# Patient Record
Sex: Male | Born: 1955
Health system: Southern US, Community
[De-identification: ages and names within clinical notes are randomized; demographics above are authoritative.]

## PROBLEM LIST (undated history)

## (undated) DIAGNOSIS — E559 Vitamin D deficiency, unspecified: Secondary | ICD-10-CM

## (undated) DIAGNOSIS — K56609 Unspecified intestinal obstruction, unspecified as to partial versus complete obstruction: Secondary | ICD-10-CM

## (undated) DIAGNOSIS — E119 Type 2 diabetes mellitus without complications: Secondary | ICD-10-CM

## (undated) DIAGNOSIS — K219 Gastro-esophageal reflux disease without esophagitis: Secondary | ICD-10-CM

## (undated) DIAGNOSIS — Z940 Kidney transplant status: Secondary | ICD-10-CM

## (undated) DIAGNOSIS — N289 Disorder of kidney and ureter, unspecified: Secondary | ICD-10-CM

## (undated) DIAGNOSIS — Z973 Presence of spectacles and contact lenses: Secondary | ICD-10-CM

## (undated) DIAGNOSIS — I499 Cardiac arrhythmia, unspecified: Secondary | ICD-10-CM

## (undated) DIAGNOSIS — I509 Heart failure, unspecified: Secondary | ICD-10-CM

## (undated) DIAGNOSIS — J9601 Acute respiratory failure with hypoxia: Secondary | ICD-10-CM

## (undated) DIAGNOSIS — R7989 Other specified abnormal findings of blood chemistry: Secondary | ICD-10-CM

## (undated) DIAGNOSIS — W3400XA Accidental discharge from unspecified firearms or gun, initial encounter: Secondary | ICD-10-CM

## (undated) DIAGNOSIS — N19 Unspecified kidney failure: Secondary | ICD-10-CM

## (undated) DIAGNOSIS — I10 Essential (primary) hypertension: Secondary | ICD-10-CM

## (undated) DIAGNOSIS — R809 Proteinuria, unspecified: Secondary | ICD-10-CM

## (undated) DIAGNOSIS — T403X1A Poisoning by methadone, accidental (unintentional), initial encounter: Secondary | ICD-10-CM

## (undated) HISTORY — PX: JOINT REPLACEMENT: SHX530

## (undated) HISTORY — DX: Kidney transplant status: Z94.0

## (undated) HISTORY — PX: TOTAL KNEE ARTHROPLASTY: SHX125

## (undated) HISTORY — PX: ARTERIOVENOUS GRAFT PLACEMENT: SUR1029

---
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
## 1986-05-10 DIAGNOSIS — W3400XA Accidental discharge from unspecified firearms or gun, initial encounter: Secondary | ICD-10-CM

## 1986-05-10 HISTORY — DX: Accidental discharge from unspecified firearms or gun, initial encounter: W34.00XA

## 1986-05-10 HISTORY — PX: COLOSTOMY: SHX63

## 1986-05-10 HISTORY — PX: EXPLORATORY LAPAROTOMY W/ BOWEL RESECTION: SHX1544

## 1986-05-10 HISTORY — PX: COLOSTOMY REVERSAL: SHX5782

## 1999-01-09 HISTORY — PX: REFRACTIVE SURGERY: SHX103

## 2001-10-08 ENCOUNTER — Inpatient Hospital Stay (HOSPITAL_COMMUNITY): Admission: EM | Admit: 2001-10-08 | Discharge: 2001-10-11 | Payer: Self-pay | Admitting: Emergency Medicine

## 2001-10-08 ENCOUNTER — Encounter: Payer: Self-pay | Admitting: Emergency Medicine

## 2001-10-10 ENCOUNTER — Encounter: Payer: Self-pay | Admitting: Internal Medicine

## 2002-05-10 HISTORY — PX: EYE SURGERY: SHX253

## 2003-10-07 ENCOUNTER — Emergency Department (HOSPITAL_COMMUNITY): Admission: EM | Admit: 2003-10-07 | Discharge: 2003-10-07 | Payer: Self-pay | Admitting: Emergency Medicine

## 2003-12-11 ENCOUNTER — Inpatient Hospital Stay (HOSPITAL_COMMUNITY): Admission: RE | Admit: 2003-12-11 | Discharge: 2003-12-14 | Payer: Self-pay | Admitting: Orthopedic Surgery

## 2004-03-09 ENCOUNTER — Encounter: Admission: RE | Admit: 2004-03-09 | Discharge: 2004-04-29 | Payer: Self-pay | Admitting: Orthopedic Surgery

## 2005-03-04 ENCOUNTER — Emergency Department (HOSPITAL_COMMUNITY): Admission: EM | Admit: 2005-03-04 | Discharge: 2005-03-04 | Payer: Self-pay | Admitting: *Deleted

## 2005-04-11 ENCOUNTER — Ambulatory Visit (HOSPITAL_COMMUNITY): Admission: RE | Admit: 2005-04-11 | Discharge: 2005-04-11 | Payer: Self-pay | Admitting: *Deleted

## 2005-10-15 ENCOUNTER — Ambulatory Visit (HOSPITAL_COMMUNITY): Admission: RE | Admit: 2005-10-15 | Discharge: 2005-10-15 | Payer: Self-pay | Admitting: Nephrology

## 2006-08-26 ENCOUNTER — Encounter: Admission: RE | Admit: 2006-08-26 | Discharge: 2006-08-26 | Payer: Self-pay | Admitting: Nephrology

## 2006-09-27 ENCOUNTER — Emergency Department (HOSPITAL_COMMUNITY): Admission: EM | Admit: 2006-09-27 | Discharge: 2006-09-27 | Payer: Self-pay | Admitting: Emergency Medicine

## 2006-12-07 ENCOUNTER — Emergency Department (HOSPITAL_COMMUNITY): Admission: EM | Admit: 2006-12-07 | Discharge: 2006-12-07 | Payer: Self-pay | Admitting: Emergency Medicine

## 2008-03-29 ENCOUNTER — Emergency Department (HOSPITAL_COMMUNITY): Admission: EM | Admit: 2008-03-29 | Discharge: 2008-03-29 | Payer: Self-pay | Admitting: Emergency Medicine

## 2008-07-17 ENCOUNTER — Encounter: Admission: RE | Admit: 2008-07-17 | Discharge: 2008-07-17 | Payer: Self-pay | Admitting: Nephrology

## 2008-08-06 ENCOUNTER — Ambulatory Visit: Payer: Self-pay | Admitting: Vascular Surgery

## 2008-08-22 ENCOUNTER — Ambulatory Visit (HOSPITAL_COMMUNITY): Admission: RE | Admit: 2008-08-22 | Discharge: 2008-08-22 | Payer: Self-pay | Admitting: Vascular Surgery

## 2008-08-22 ENCOUNTER — Ambulatory Visit: Payer: Self-pay | Admitting: Vascular Surgery

## 2008-10-31 ENCOUNTER — Inpatient Hospital Stay (HOSPITAL_COMMUNITY): Admission: AD | Admit: 2008-10-31 | Discharge: 2008-11-05 | Payer: Self-pay | Admitting: Nephrology

## 2009-04-14 ENCOUNTER — Emergency Department (HOSPITAL_COMMUNITY): Admission: EM | Admit: 2009-04-14 | Discharge: 2009-04-14 | Payer: Self-pay | Admitting: Emergency Medicine

## 2009-04-17 ENCOUNTER — Observation Stay (HOSPITAL_COMMUNITY): Admission: EM | Admit: 2009-04-17 | Discharge: 2009-04-18 | Payer: Self-pay | Admitting: Emergency Medicine

## 2009-05-12 ENCOUNTER — Encounter: Admission: RE | Admit: 2009-05-12 | Discharge: 2009-05-12 | Payer: Self-pay | Admitting: Nephrology

## 2009-06-06 ENCOUNTER — Ambulatory Visit (HOSPITAL_COMMUNITY): Admission: RE | Admit: 2009-06-06 | Discharge: 2009-06-06 | Payer: Self-pay | Admitting: Nephrology

## 2009-07-10 ENCOUNTER — Encounter: Admission: RE | Admit: 2009-07-10 | Discharge: 2009-08-05 | Payer: Self-pay | Admitting: Neurosurgery

## 2009-09-02 ENCOUNTER — Ambulatory Visit (HOSPITAL_COMMUNITY): Admission: RE | Admit: 2009-09-02 | Discharge: 2009-09-02 | Payer: Self-pay | Admitting: Nephrology

## 2009-09-04 ENCOUNTER — Ambulatory Visit (HOSPITAL_COMMUNITY): Admission: RE | Admit: 2009-09-04 | Discharge: 2009-09-04 | Payer: Self-pay | Admitting: Nephrology

## 2009-09-09 ENCOUNTER — Emergency Department (HOSPITAL_COMMUNITY): Admission: EM | Admit: 2009-09-09 | Discharge: 2009-09-09 | Payer: Self-pay | Admitting: Emergency Medicine

## 2009-09-09 ENCOUNTER — Emergency Department (HOSPITAL_COMMUNITY): Admission: EM | Admit: 2009-09-09 | Discharge: 2009-09-09 | Payer: Self-pay | Admitting: Family Medicine

## 2009-09-09 ENCOUNTER — Ambulatory Visit: Payer: Self-pay | Admitting: Vascular Surgery

## 2009-09-18 ENCOUNTER — Encounter: Admission: RE | Admit: 2009-09-18 | Discharge: 2009-09-18 | Payer: Self-pay | Admitting: Neurosurgery

## 2009-12-02 ENCOUNTER — Emergency Department (HOSPITAL_COMMUNITY): Admission: EM | Admit: 2009-12-02 | Discharge: 2009-12-02 | Payer: Self-pay | Admitting: Emergency Medicine

## 2009-12-10 ENCOUNTER — Ambulatory Visit (HOSPITAL_COMMUNITY): Admission: RE | Admit: 2009-12-10 | Discharge: 2009-12-10 | Payer: Self-pay | Admitting: Nephrology

## 2009-12-10 ENCOUNTER — Encounter: Admission: RE | Admit: 2009-12-10 | Discharge: 2010-03-10 | Payer: Self-pay | Admitting: Sports Medicine

## 2009-12-19 ENCOUNTER — Observation Stay (HOSPITAL_COMMUNITY): Admission: EM | Admit: 2009-12-19 | Discharge: 2009-12-20 | Payer: Self-pay | Admitting: Emergency Medicine

## 2010-07-24 LAB — HEPATIC FUNCTION PANEL
ALT: 23 U/L (ref 0–53)
AST: 24 U/L (ref 0–37)
Albumin: 3.2 g/dL — ABNORMAL LOW (ref 3.5–5.2)
Alkaline Phosphatase: 60 U/L (ref 39–117)
Bilirubin, Direct: 0.2 mg/dL (ref 0.0–0.3)
Indirect Bilirubin: 0.3 mg/dL (ref 0.3–0.9)
Total Bilirubin: 0.5 mg/dL (ref 0.3–1.2)
Total Protein: 6.8 g/dL (ref 6.0–8.3)

## 2010-07-24 LAB — COMPREHENSIVE METABOLIC PANEL
ALT: 19 U/L (ref 0–53)
AST: 17 U/L (ref 0–37)
Albumin: 3 g/dL — ABNORMAL LOW (ref 3.5–5.2)
Alkaline Phosphatase: 51 U/L (ref 39–117)
BUN: 44 mg/dL — ABNORMAL HIGH (ref 6–23)
CO2: 28 mEq/L (ref 19–32)
Calcium: 9.4 mg/dL (ref 8.4–10.5)
Chloride: 95 mEq/L — ABNORMAL LOW (ref 96–112)
Creatinine, Ser: 11.12 mg/dL — ABNORMAL HIGH (ref 0.4–1.5)
GFR calc Af Amer: 6 mL/min — ABNORMAL LOW (ref >60)
GFR calc non Af Amer: 5 mL/min — ABNORMAL LOW (ref >60)
Glucose, Bld: 230 mg/dL — ABNORMAL HIGH (ref 70–99)
Potassium: 4.6 mEq/L (ref 3.5–5.1)
Sodium: 137 mEq/L (ref 135–145)
Total Bilirubin: 0.4 mg/dL (ref 0.3–1.2)
Total Protein: 6.4 g/dL (ref 6.0–8.3)

## 2010-07-24 LAB — CBC
HCT: 36 % — ABNORMAL LOW (ref 39.0–52.0)
HCT: 36.9 % — ABNORMAL LOW (ref 39.0–52.0)
Hemoglobin: 11.3 g/dL — ABNORMAL LOW (ref 13.0–17.0)
Hemoglobin: 12.2 g/dL — ABNORMAL LOW (ref 13.0–17.0)
MCH: 28.5 pg (ref 26.0–34.0)
MCH: 29.5 pg (ref 26.0–34.0)
MCHC: 31.4 g/dL (ref 30.0–36.0)
MCHC: 33.1 g/dL (ref 30.0–36.0)
MCV: 89.3 fL (ref 78.0–100.0)
MCV: 90.9 fL (ref 78.0–100.0)
Platelets: 195 10*3/uL (ref 150–400)
Platelets: 200 10*3/uL (ref 150–400)
RBC: 3.96 MIL/uL — ABNORMAL LOW (ref 4.22–5.81)
RBC: 4.13 MIL/uL — ABNORMAL LOW (ref 4.22–5.81)
RDW: 13.5 % (ref 11.5–15.5)
RDW: 13.6 % (ref 11.5–15.5)
WBC: 10.7 10*3/uL — ABNORMAL HIGH (ref 4.0–10.5)
WBC: 9 10*3/uL (ref 4.0–10.5)

## 2010-07-24 LAB — BASIC METABOLIC PANEL
BUN: 36 mg/dL — ABNORMAL HIGH (ref 6–23)
CO2: 29 mEq/L (ref 19–32)
Calcium: 9.4 mg/dL (ref 8.4–10.5)
Chloride: 98 mEq/L (ref 96–112)
Creatinine, Ser: 9.87 mg/dL — ABNORMAL HIGH (ref 0.4–1.5)
GFR calc Af Amer: 7 mL/min — ABNORMAL LOW (ref >60)
GFR calc non Af Amer: 6 mL/min — ABNORMAL LOW (ref >60)
Glucose, Bld: 300 mg/dL — ABNORMAL HIGH (ref 70–99)
Potassium: 4.7 mEq/L (ref 3.5–5.1)
Sodium: 136 mEq/L (ref 135–145)

## 2010-07-24 LAB — CARDIAC PANEL(CRET KIN+CKTOT+MB+TROPI)
CK, MB: 2.5 ng/mL (ref 0.3–4.0)
CK, MB: 2.6 ng/mL (ref 0.3–4.0)
Relative Index: 2.3 (ref 0.0–2.5)
Relative Index: INVALID (ref 0.0–2.5)
Total CK: 109 U/L (ref 7–232)
Total CK: 90 U/L (ref 7–232)
Troponin I: 0.03 ng/mL (ref 0.00–0.06)
Troponin I: 0.04 ng/mL (ref 0.00–0.06)

## 2010-07-24 LAB — DIFFERENTIAL
Basophils Absolute: 0 10*3/uL (ref 0.0–0.1)
Basophils Relative: 0 % (ref 0–1)
Eosinophils Absolute: 0.2 10*3/uL (ref 0.0–0.7)
Eosinophils Relative: 2 % (ref 0–5)
Lymphocytes Relative: 16 % (ref 12–46)
Lymphs Abs: 1.7 10*3/uL (ref 0.7–4.0)
Monocytes Absolute: 0.7 10*3/uL (ref 0.1–1.0)
Monocytes Relative: 7 % (ref 3–12)
Neutro Abs: 8 10*3/uL — ABNORMAL HIGH (ref 1.7–7.7)
Neutrophils Relative %: 75 % (ref 43–77)

## 2010-07-24 LAB — CULTURE, BLOOD (ROUTINE X 2)
Culture: NO GROWTH
Culture: NO GROWTH

## 2010-07-24 LAB — CK TOTAL AND CKMB (NOT AT ARMC)
CK, MB: 2.2 ng/mL (ref 0.3–4.0)
CK, MB: 2.3 ng/mL (ref 0.3–4.0)
Relative Index: INVALID (ref 0.0–2.5)
Relative Index: INVALID (ref 0.0–2.5)
Total CK: 97 U/L (ref 7–232)
Total CK: 99 U/L (ref 7–232)

## 2010-07-24 LAB — GLUCOSE, CAPILLARY
Glucose-Capillary: 161 mg/dL — ABNORMAL HIGH (ref 70–99)
Glucose-Capillary: 264 mg/dL — ABNORMAL HIGH (ref 70–99)

## 2010-07-24 LAB — MRSA PCR SCREENING: MRSA by PCR: NEGATIVE

## 2010-07-24 LAB — TROPONIN I
Troponin I: 0.03 ng/mL (ref 0.00–0.06)
Troponin I: 0.03 ng/mL (ref 0.00–0.06)

## 2010-08-11 LAB — POCT CARDIAC MARKERS
CKMB, poc: 1.1 ng/mL (ref 1.0–8.0)
CKMB, poc: 1.9 ng/mL (ref 1.0–8.0)
Myoglobin, poc: >500 ng/mL (ref 12–200)
Myoglobin, poc: >500 ng/mL (ref 12–200)
Troponin i, poc: <0.05 ng/mL (ref 0.00–0.09)
Troponin i, poc: <0.05 ng/mL (ref 0.00–0.09)

## 2010-08-11 LAB — RENAL FUNCTION PANEL
Albumin: 3 g/dL — ABNORMAL LOW (ref 3.5–5.2)
Albumin: 3.1 g/dL — ABNORMAL LOW (ref 3.5–5.2)
BUN: 36 mg/dL — ABNORMAL HIGH (ref 6–23)
BUN: 41 mg/dL — ABNORMAL HIGH (ref 6–23)
CO2: 25 mEq/L (ref 19–32)
CO2: 30 mEq/L (ref 19–32)
Calcium: 9 mg/dL (ref 8.4–10.5)
Calcium: 9.4 mg/dL (ref 8.4–10.5)
Chloride: 94 mEq/L — ABNORMAL LOW (ref 96–112)
Chloride: 98 mEq/L (ref 96–112)
Creatinine, Ser: 11.2 mg/dL — ABNORMAL HIGH (ref 0.4–1.5)
Creatinine, Ser: 11.88 mg/dL — ABNORMAL HIGH (ref 0.4–1.5)
GFR calc Af Amer: 5 mL/min — ABNORMAL LOW (ref >60)
GFR calc Af Amer: 6 mL/min — ABNORMAL LOW (ref >60)
GFR calc non Af Amer: 4 mL/min — ABNORMAL LOW (ref >60)
GFR calc non Af Amer: 5 mL/min — ABNORMAL LOW (ref >60)
Glucose, Bld: 140 mg/dL — ABNORMAL HIGH (ref 70–99)
Glucose, Bld: 191 mg/dL — ABNORMAL HIGH (ref 70–99)
Phosphorus: 3.4 mg/dL (ref 2.3–4.6)
Phosphorus: 4.6 mg/dL (ref 2.3–4.6)
Potassium: 4.8 mEq/L (ref 3.5–5.1)
Potassium: 4.9 mEq/L (ref 3.5–5.1)
Sodium: 131 mEq/L — ABNORMAL LOW (ref 135–145)
Sodium: 138 mEq/L (ref 135–145)

## 2010-08-11 LAB — CBC
HCT: 33.6 % — ABNORMAL LOW (ref 39.0–52.0)
HCT: 34.7 % — ABNORMAL LOW (ref 39.0–52.0)
HCT: 38.2 % — ABNORMAL LOW (ref 39.0–52.0)
Hemoglobin: 11.3 g/dL — ABNORMAL LOW (ref 13.0–17.0)
Hemoglobin: 11.5 g/dL — ABNORMAL LOW (ref 13.0–17.0)
Hemoglobin: 12.5 g/dL — ABNORMAL LOW (ref 13.0–17.0)
MCHC: 32.8 g/dL (ref 30.0–36.0)
MCHC: 33.3 g/dL (ref 30.0–36.0)
MCHC: 33.6 g/dL (ref 30.0–36.0)
MCV: 90 fL (ref 78.0–100.0)
MCV: 90.1 fL (ref 78.0–100.0)
MCV: 90.2 fL (ref 78.0–100.0)
Platelets: 224 10*3/uL (ref 150–400)
Platelets: 234 10*3/uL (ref 150–400)
Platelets: 269 10*3/uL (ref 150–400)
RBC: 3.72 MIL/uL — ABNORMAL LOW (ref 4.22–5.81)
RBC: 3.85 MIL/uL — ABNORMAL LOW (ref 4.22–5.81)
RBC: 4.24 MIL/uL (ref 4.22–5.81)
RDW: 16.1 % — ABNORMAL HIGH (ref 11.5–15.5)
RDW: 16.2 % — ABNORMAL HIGH (ref 11.5–15.5)
RDW: 16.4 % — ABNORMAL HIGH (ref 11.5–15.5)
WBC: 10.4 10*3/uL (ref 4.0–10.5)
WBC: 7.5 10*3/uL (ref 4.0–10.5)
WBC: 9.1 10*3/uL (ref 4.0–10.5)

## 2010-08-11 LAB — GLUCOSE, CAPILLARY
Glucose-Capillary: 128 mg/dL — ABNORMAL HIGH (ref 70–99)
Glucose-Capillary: 131 mg/dL — ABNORMAL HIGH (ref 70–99)
Glucose-Capillary: 64 mg/dL — ABNORMAL LOW (ref 70–99)
Glucose-Capillary: 69 mg/dL — ABNORMAL LOW (ref 70–99)

## 2010-08-11 LAB — DIFFERENTIAL
Basophils Absolute: 0 10*3/uL (ref 0.0–0.1)
Basophils Absolute: 0 10*3/uL (ref 0.0–0.1)
Basophils Absolute: 0.1 10*3/uL (ref 0.0–0.1)
Basophils Relative: 0 % (ref 0–1)
Basophils Relative: 0 % (ref 0–1)
Basophils Relative: 1 % (ref 0–1)
Eosinophils Absolute: 0.3 10*3/uL (ref 0.0–0.7)
Eosinophils Absolute: 0.4 10*3/uL (ref 0.0–0.7)
Eosinophils Absolute: 0.5 10*3/uL (ref 0.0–0.7)
Eosinophils Relative: 4 % (ref 0–5)
Eosinophils Relative: 5 % (ref 0–5)
Eosinophils Relative: 5 % (ref 0–5)
Lymphocytes Relative: 15 % (ref 12–46)
Lymphocytes Relative: 22 % (ref 12–46)
Lymphocytes Relative: 30 % (ref 12–46)
Lymphs Abs: 1.5 10*3/uL (ref 0.7–4.0)
Lymphs Abs: 2 10*3/uL (ref 0.7–4.0)
Lymphs Abs: 2.3 10*3/uL (ref 0.7–4.0)
Monocytes Absolute: 0.7 10*3/uL (ref 0.1–1.0)
Monocytes Absolute: 0.8 10*3/uL (ref 0.1–1.0)
Monocytes Absolute: 0.9 10*3/uL (ref 0.1–1.0)
Monocytes Relative: 12 % (ref 3–12)
Monocytes Relative: 7 % (ref 3–12)
Monocytes Relative: 9 % (ref 3–12)
Neutro Abs: 3.9 10*3/uL (ref 1.7–7.7)
Neutro Abs: 5.9 10*3/uL (ref 1.7–7.7)
Neutro Abs: 7.7 10*3/uL (ref 1.7–7.7)
Neutrophils Relative %: 52 % (ref 43–77)
Neutrophils Relative %: 65 % (ref 43–77)
Neutrophils Relative %: 74 % (ref 43–77)

## 2010-08-11 LAB — PROTIME-INR
INR: 1 (ref 0.00–1.49)
Prothrombin Time: 13.1 seconds (ref 11.6–15.2)

## 2010-08-11 LAB — BASIC METABOLIC PANEL
BUN: 32 mg/dL — ABNORMAL HIGH (ref 6–23)
CO2: 33 mEq/L — ABNORMAL HIGH (ref 19–32)
Calcium: 10.3 mg/dL (ref 8.4–10.5)
Chloride: 91 mEq/L — ABNORMAL LOW (ref 96–112)
Creatinine, Ser: 10.47 mg/dL — ABNORMAL HIGH (ref 0.4–1.5)
GFR calc Af Amer: 6 mL/min — ABNORMAL LOW (ref >60)
GFR calc non Af Amer: 5 mL/min — ABNORMAL LOW (ref >60)
Glucose, Bld: 236 mg/dL — ABNORMAL HIGH (ref 70–99)
Potassium: 4.3 mEq/L (ref 3.5–5.1)
Sodium: 138 mEq/L (ref 135–145)

## 2010-08-11 LAB — CARDIAC PANEL(CRET KIN+CKTOT+MB+TROPI)
CK, MB: 1 ng/mL (ref 0.3–4.0)
CK, MB: 1.5 ng/mL (ref 0.3–4.0)
Relative Index: INVALID (ref 0.0–2.5)
Relative Index: INVALID (ref 0.0–2.5)
Total CK: 75 U/L (ref 7–232)
Total CK: 81 U/L (ref 7–232)
Troponin I: 0.02 ng/mL (ref 0.00–0.06)
Troponin I: 0.03 ng/mL (ref 0.00–0.06)

## 2010-08-11 LAB — CK TOTAL AND CKMB (NOT AT ARMC)
CK, MB: 1.4 ng/mL (ref 0.3–4.0)
Relative Index: 1.3 (ref 0.0–2.5)
Total CK: 112 U/L (ref 7–232)

## 2010-08-11 LAB — TROPONIN I: Troponin I: 0.02 ng/mL (ref 0.00–0.06)

## 2010-08-17 LAB — CBC
HCT: 28.1 % — ABNORMAL LOW (ref 39.0–52.0)
HCT: 28.1 % — ABNORMAL LOW (ref 39.0–52.0)
HCT: 29 % — ABNORMAL LOW (ref 39.0–52.0)
HCT: 29.9 % — ABNORMAL LOW (ref 39.0–52.0)
HCT: 30.9 % — ABNORMAL LOW (ref 39.0–52.0)
HCT: 32.4 % — ABNORMAL LOW (ref 39.0–52.0)
HCT: 33.4 % — ABNORMAL LOW (ref 39.0–52.0)
Hemoglobin: 10 g/dL — ABNORMAL LOW (ref 13.0–17.0)
Hemoglobin: 10.5 g/dL — ABNORMAL LOW (ref 13.0–17.0)
Hemoglobin: 10.7 g/dL — ABNORMAL LOW (ref 13.0–17.0)
Hemoglobin: 10.9 g/dL — ABNORMAL LOW (ref 13.0–17.0)
Hemoglobin: 9 g/dL — ABNORMAL LOW (ref 13.0–17.0)
Hemoglobin: 9.1 g/dL — ABNORMAL LOW (ref 13.0–17.0)
Hemoglobin: 9.8 g/dL — ABNORMAL LOW (ref 13.0–17.0)
MCHC: 32 g/dL (ref 30.0–36.0)
MCHC: 32.6 g/dL (ref 30.0–36.0)
MCHC: 32.8 g/dL (ref 30.0–36.0)
MCHC: 32.9 g/dL (ref 30.0–36.0)
MCHC: 33.6 g/dL (ref 30.0–36.0)
MCHC: 33.7 g/dL (ref 30.0–36.0)
MCHC: 34 g/dL (ref 30.0–36.0)
MCV: 87.9 fL (ref 78.0–100.0)
MCV: 87.9 fL (ref 78.0–100.0)
MCV: 88 fL (ref 78.0–100.0)
MCV: 88.1 fL (ref 78.0–100.0)
MCV: 88.3 fL (ref 78.0–100.0)
MCV: 89.1 fL (ref 78.0–100.0)
MCV: 89.1 fL (ref 78.0–100.0)
Platelets: 216 10*3/uL (ref 150–400)
Platelets: 217 10*3/uL (ref 150–400)
Platelets: 219 10*3/uL (ref 150–400)
Platelets: 223 10*3/uL (ref 150–400)
Platelets: 227 10*3/uL (ref 150–400)
Platelets: 228 10*3/uL (ref 150–400)
Platelets: 235 10*3/uL (ref 150–400)
RBC: 3.16 MIL/uL — ABNORMAL LOW (ref 4.22–5.81)
RBC: 3.19 MIL/uL — ABNORMAL LOW (ref 4.22–5.81)
RBC: 3.3 MIL/uL — ABNORMAL LOW (ref 4.22–5.81)
RBC: 3.4 MIL/uL — ABNORMAL LOW (ref 4.22–5.81)
RBC: 3.49 MIL/uL — ABNORMAL LOW (ref 4.22–5.81)
RBC: 3.63 MIL/uL — ABNORMAL LOW (ref 4.22–5.81)
RBC: 3.8 MIL/uL — ABNORMAL LOW (ref 4.22–5.81)
RDW: 15.2 % (ref 11.5–15.5)
RDW: 15.7 % — ABNORMAL HIGH (ref 11.5–15.5)
RDW: 15.9 % — ABNORMAL HIGH (ref 11.5–15.5)
RDW: 15.9 % — ABNORMAL HIGH (ref 11.5–15.5)
RDW: 15.9 % — ABNORMAL HIGH (ref 11.5–15.5)
RDW: 16.1 % — ABNORMAL HIGH (ref 11.5–15.5)
RDW: 16.4 % — ABNORMAL HIGH (ref 11.5–15.5)
WBC: 5.6 10*3/uL (ref 4.0–10.5)
WBC: 6.1 10*3/uL (ref 4.0–10.5)
WBC: 6.7 10*3/uL (ref 4.0–10.5)
WBC: 7 10*3/uL (ref 4.0–10.5)
WBC: 7.5 10*3/uL (ref 4.0–10.5)
WBC: 8.8 10*3/uL (ref 4.0–10.5)
WBC: 9.1 10*3/uL (ref 4.0–10.5)

## 2010-08-17 LAB — RENAL FUNCTION PANEL
Albumin: 2.7 g/dL — ABNORMAL LOW (ref 3.5–5.2)
Albumin: 2.8 g/dL — ABNORMAL LOW (ref 3.5–5.2)
Albumin: 2.9 g/dL — ABNORMAL LOW (ref 3.5–5.2)
Albumin: 3 g/dL — ABNORMAL LOW (ref 3.5–5.2)
Albumin: 3 g/dL — ABNORMAL LOW (ref 3.5–5.2)
Albumin: 3.1 g/dL — ABNORMAL LOW (ref 3.5–5.2)
BUN: 46 mg/dL — ABNORMAL HIGH (ref 6–23)
BUN: 50 mg/dL — ABNORMAL HIGH (ref 6–23)
BUN: 61 mg/dL — ABNORMAL HIGH (ref 6–23)
BUN: 62 mg/dL — ABNORMAL HIGH (ref 6–23)
BUN: 66 mg/dL — ABNORMAL HIGH (ref 6–23)
BUN: 69 mg/dL — ABNORMAL HIGH (ref 6–23)
CO2: 14 mEq/L — ABNORMAL LOW (ref 19–32)
CO2: 18 mEq/L — ABNORMAL LOW (ref 19–32)
CO2: 22 mEq/L (ref 19–32)
CO2: 23 mEq/L (ref 19–32)
CO2: 23 mEq/L (ref 19–32)
CO2: 25 mEq/L (ref 19–32)
Calcium: 8.2 mg/dL — ABNORMAL LOW (ref 8.4–10.5)
Calcium: 8.4 mg/dL (ref 8.4–10.5)
Calcium: 8.7 mg/dL (ref 8.4–10.5)
Calcium: 8.7 mg/dL (ref 8.4–10.5)
Calcium: 8.8 mg/dL (ref 8.4–10.5)
Calcium: 9.2 mg/dL (ref 8.4–10.5)
Chloride: 103 mEq/L (ref 96–112)
Chloride: 106 mEq/L (ref 96–112)
Chloride: 106 mEq/L (ref 96–112)
Chloride: 110 mEq/L (ref 96–112)
Chloride: 110 mEq/L (ref 96–112)
Chloride: 99 mEq/L (ref 96–112)
Creatinine, Ser: 10.04 mg/dL — ABNORMAL HIGH (ref 0.4–1.5)
Creatinine, Ser: 11.33 mg/dL — ABNORMAL HIGH (ref 0.4–1.5)
Creatinine, Ser: 8.18 mg/dL — ABNORMAL HIGH (ref 0.4–1.5)
Creatinine, Ser: 8.57 mg/dL — ABNORMAL HIGH (ref 0.4–1.5)
Creatinine, Ser: 9.37 mg/dL — ABNORMAL HIGH (ref 0.4–1.5)
Creatinine, Ser: 9.43 mg/dL — ABNORMAL HIGH (ref 0.4–1.5)
GFR calc Af Amer: 6 mL/min — ABNORMAL LOW (ref >60)
GFR calc Af Amer: 7 mL/min — ABNORMAL LOW (ref >60)
GFR calc Af Amer: 7 mL/min — ABNORMAL LOW (ref >60)
GFR calc Af Amer: 7 mL/min — ABNORMAL LOW (ref >60)
GFR calc Af Amer: 8 mL/min — ABNORMAL LOW (ref >60)
GFR calc Af Amer: 8 mL/min — ABNORMAL LOW (ref >60)
GFR calc non Af Amer: 5 mL/min — ABNORMAL LOW (ref >60)
GFR calc non Af Amer: 5 mL/min — ABNORMAL LOW (ref >60)
GFR calc non Af Amer: 6 mL/min — ABNORMAL LOW (ref >60)
GFR calc non Af Amer: 6 mL/min — ABNORMAL LOW (ref >60)
GFR calc non Af Amer: 7 mL/min — ABNORMAL LOW (ref >60)
GFR calc non Af Amer: 7 mL/min — ABNORMAL LOW (ref >60)
Glucose, Bld: 100 mg/dL — ABNORMAL HIGH (ref 70–99)
Glucose, Bld: 128 mg/dL — ABNORMAL HIGH (ref 70–99)
Glucose, Bld: 134 mg/dL — ABNORMAL HIGH (ref 70–99)
Glucose, Bld: 167 mg/dL — ABNORMAL HIGH (ref 70–99)
Glucose, Bld: 169 mg/dL — ABNORMAL HIGH (ref 70–99)
Glucose, Bld: 213 mg/dL — ABNORMAL HIGH (ref 70–99)
Phosphorus: 4.6 mg/dL (ref 2.3–4.6)
Phosphorus: 4.7 mg/dL — ABNORMAL HIGH (ref 2.3–4.6)
Phosphorus: 5.4 mg/dL — ABNORMAL HIGH (ref 2.3–4.6)
Phosphorus: 5.4 mg/dL — ABNORMAL HIGH (ref 2.3–4.6)
Phosphorus: 6.2 mg/dL — ABNORMAL HIGH (ref 2.3–4.6)
Phosphorus: 6.9 mg/dL — ABNORMAL HIGH (ref 2.3–4.6)
Potassium: 3.9 mEq/L (ref 3.5–5.1)
Potassium: 4.2 mEq/L (ref 3.5–5.1)
Potassium: 4.2 mEq/L (ref 3.5–5.1)
Potassium: 4.7 mEq/L (ref 3.5–5.1)
Potassium: 4.7 mEq/L (ref 3.5–5.1)
Potassium: 5.2 mEq/L — ABNORMAL HIGH (ref 3.5–5.1)
Sodium: 133 mEq/L — ABNORMAL LOW (ref 135–145)
Sodium: 135 mEq/L (ref 135–145)
Sodium: 136 mEq/L (ref 135–145)
Sodium: 138 mEq/L (ref 135–145)
Sodium: 140 mEq/L (ref 135–145)
Sodium: 140 mEq/L (ref 135–145)

## 2010-08-17 LAB — URINALYSIS, ROUTINE W REFLEX MICROSCOPIC
Bilirubin Urine: NEGATIVE
Glucose, UA: NEGATIVE mg/dL
Ketones, ur: NEGATIVE mg/dL
Leukocytes, UA: NEGATIVE
Nitrite: NEGATIVE
Protein, ur: >300 mg/dL — AB
Specific Gravity, Urine: 1.015 (ref 1.005–1.030)
Urobilinogen, UA: 0.2 mg/dL (ref 0.0–1.0)
pH: 5.5 (ref 5.0–8.0)

## 2010-08-17 LAB — COMPREHENSIVE METABOLIC PANEL
ALT: 16 U/L (ref 0–53)
AST: 18 U/L (ref 0–37)
Albumin: 3.4 g/dL — ABNORMAL LOW (ref 3.5–5.2)
Alkaline Phosphatase: 93 U/L (ref 39–117)
BUN: 67 mg/dL — ABNORMAL HIGH (ref 6–23)
CO2: 18 mEq/L — ABNORMAL LOW (ref 19–32)
Calcium: 9 mg/dL (ref 8.4–10.5)
Chloride: 116 mEq/L — ABNORMAL HIGH (ref 96–112)
Creatinine, Ser: 11.78 mg/dL — ABNORMAL HIGH (ref 0.4–1.5)
GFR calc Af Amer: 6 mL/min — ABNORMAL LOW (ref >60)
GFR calc non Af Amer: 5 mL/min — ABNORMAL LOW (ref >60)
Glucose, Bld: 126 mg/dL — ABNORMAL HIGH (ref 70–99)
Potassium: 5.8 mEq/L — ABNORMAL HIGH (ref 3.5–5.1)
Sodium: 141 mEq/L (ref 135–145)
Total Bilirubin: 0.4 mg/dL (ref 0.3–1.2)
Total Protein: 6.9 g/dL (ref 6.0–8.3)

## 2010-08-17 LAB — DIFFERENTIAL
Basophils Absolute: 0 10*3/uL (ref 0.0–0.1)
Basophils Absolute: 0 10*3/uL (ref 0.0–0.1)
Basophils Absolute: 0 10*3/uL (ref 0.0–0.1)
Basophils Absolute: 0 10*3/uL (ref 0.0–0.1)
Basophils Absolute: 0 10*3/uL (ref 0.0–0.1)
Basophils Absolute: 0 10*3/uL (ref 0.0–0.1)
Basophils Relative: 0 % (ref 0–1)
Basophils Relative: 0 % (ref 0–1)
Basophils Relative: 0 % (ref 0–1)
Basophils Relative: 1 % (ref 0–1)
Basophils Relative: 1 % (ref 0–1)
Basophils Relative: 1 % (ref 0–1)
Eosinophils Absolute: 0.2 10*3/uL (ref 0.0–0.7)
Eosinophils Absolute: 0.2 10*3/uL (ref 0.0–0.7)
Eosinophils Absolute: 0.2 10*3/uL (ref 0.0–0.7)
Eosinophils Absolute: 0.3 10*3/uL (ref 0.0–0.7)
Eosinophils Absolute: 0.3 10*3/uL (ref 0.0–0.7)
Eosinophils Absolute: 0.3 10*3/uL (ref 0.0–0.7)
Eosinophils Relative: 2 % (ref 0–5)
Eosinophils Relative: 3 % (ref 0–5)
Eosinophils Relative: 3 % (ref 0–5)
Eosinophils Relative: 4 % (ref 0–5)
Eosinophils Relative: 4 % (ref 0–5)
Eosinophils Relative: 4 % (ref 0–5)
Lymphocytes Relative: 16 % (ref 12–46)
Lymphocytes Relative: 16 % (ref 12–46)
Lymphocytes Relative: 17 % (ref 12–46)
Lymphocytes Relative: 20 % (ref 12–46)
Lymphocytes Relative: 21 % (ref 12–46)
Lymphocytes Relative: 22 % (ref 12–46)
Lymphs Abs: 1 10*3/uL (ref 0.7–4.0)
Lymphs Abs: 1.2 10*3/uL (ref 0.7–4.0)
Lymphs Abs: 1.2 10*3/uL (ref 0.7–4.0)
Lymphs Abs: 1.4 10*3/uL (ref 0.7–4.0)
Lymphs Abs: 1.5 10*3/uL (ref 0.7–4.0)
Lymphs Abs: 1.5 10*3/uL (ref 0.7–4.0)
Monocytes Absolute: 0.3 10*3/uL (ref 0.1–1.0)
Monocytes Absolute: 0.4 10*3/uL (ref 0.1–1.0)
Monocytes Absolute: 0.5 10*3/uL (ref 0.1–1.0)
Monocytes Absolute: 0.6 10*3/uL (ref 0.1–1.0)
Monocytes Absolute: 0.6 10*3/uL (ref 0.1–1.0)
Monocytes Absolute: 0.8 10*3/uL (ref 0.1–1.0)
Monocytes Relative: 5 % (ref 3–12)
Monocytes Relative: 5 % (ref 3–12)
Monocytes Relative: 8 % (ref 3–12)
Monocytes Relative: 9 % (ref 3–12)
Monocytes Relative: 9 % (ref 3–12)
Monocytes Relative: 9 % (ref 3–12)
Neutro Abs: 3.7 10*3/uL (ref 1.7–7.7)
Neutro Abs: 4.3 10*3/uL (ref 1.7–7.7)
Neutro Abs: 4.6 10*3/uL (ref 1.7–7.7)
Neutro Abs: 4.9 10*3/uL (ref 1.7–7.7)
Neutro Abs: 5.4 10*3/uL (ref 1.7–7.7)
Neutro Abs: 6.3 10*3/uL (ref 1.7–7.7)
Neutrophils Relative %: 65 % (ref 43–77)
Neutrophils Relative %: 66 % (ref 43–77)
Neutrophils Relative %: 70 % (ref 43–77)
Neutrophils Relative %: 71 % (ref 43–77)
Neutrophils Relative %: 72 % (ref 43–77)
Neutrophils Relative %: 75 % (ref 43–77)

## 2010-08-17 LAB — GLUCOSE, CAPILLARY
Glucose-Capillary: 109 mg/dL — ABNORMAL HIGH (ref 70–99)
Glucose-Capillary: 116 mg/dL — ABNORMAL HIGH (ref 70–99)
Glucose-Capillary: 120 mg/dL — ABNORMAL HIGH (ref 70–99)
Glucose-Capillary: 121 mg/dL — ABNORMAL HIGH (ref 70–99)
Glucose-Capillary: 127 mg/dL — ABNORMAL HIGH (ref 70–99)
Glucose-Capillary: 135 mg/dL — ABNORMAL HIGH (ref 70–99)
Glucose-Capillary: 141 mg/dL — ABNORMAL HIGH (ref 70–99)
Glucose-Capillary: 147 mg/dL — ABNORMAL HIGH (ref 70–99)
Glucose-Capillary: 152 mg/dL — ABNORMAL HIGH (ref 70–99)
Glucose-Capillary: 152 mg/dL — ABNORMAL HIGH (ref 70–99)
Glucose-Capillary: 154 mg/dL — ABNORMAL HIGH (ref 70–99)
Glucose-Capillary: 154 mg/dL — ABNORMAL HIGH (ref 70–99)
Glucose-Capillary: 158 mg/dL — ABNORMAL HIGH (ref 70–99)
Glucose-Capillary: 160 mg/dL — ABNORMAL HIGH (ref 70–99)
Glucose-Capillary: 179 mg/dL — ABNORMAL HIGH (ref 70–99)
Glucose-Capillary: 207 mg/dL — ABNORMAL HIGH (ref 70–99)
Glucose-Capillary: 223 mg/dL — ABNORMAL HIGH (ref 70–99)
Glucose-Capillary: 258 mg/dL — ABNORMAL HIGH (ref 70–99)
Glucose-Capillary: 86 mg/dL (ref 70–99)
Glucose-Capillary: 94 mg/dL (ref 70–99)
Glucose-Capillary: 99 mg/dL (ref 70–99)
Glucose-Capillary: 99 mg/dL (ref 70–99)

## 2010-08-17 LAB — LIPID PANEL
Cholesterol: 187 mg/dL (ref 0–200)
HDL: 45 mg/dL (ref >39)
LDL Cholesterol: 123 mg/dL — ABNORMAL HIGH (ref 0–99)
Total CHOL/HDL Ratio: 4.2 RATIO
Triglycerides: 97 mg/dL (ref <150)
VLDL: 19 mg/dL (ref 0–40)

## 2010-08-17 LAB — URINE MICROSCOPIC-ADD ON

## 2010-08-17 LAB — HEPATITIS A ANTIBODY, TOTAL: Hep A Total Ab: NEGATIVE

## 2010-08-17 LAB — CULTURE, BLOOD (ROUTINE X 2)
Culture: NO GROWTH
Culture: NO GROWTH

## 2010-08-17 LAB — HEPATITIS B SURFACE ANTIBODY,QUALITATIVE: Hep B S Ab: NEGATIVE

## 2010-08-17 LAB — HIV ANTIBODY (ROUTINE TESTING W REFLEX): HIV: NONREACTIVE

## 2010-08-17 LAB — HEMOGLOBIN A1C
Hgb A1c MFr Bld: 5.1 % (ref 4.6–6.1)
Mean Plasma Glucose: 100 mg/dL

## 2010-08-17 LAB — HEPATITIS C ANTIBODY: HCV Ab: REACTIVE — AB

## 2010-08-17 LAB — ALT: ALT: 16 U/L (ref 0–53)

## 2010-08-17 LAB — HEPATITIS B SURFACE ANTIGEN: Hepatitis B Surface Ag: NEGATIVE

## 2010-08-19 LAB — POCT I-STAT 4, (NA,K, GLUC, HGB,HCT)
Glucose, Bld: 91 mg/dL (ref 70–99)
HCT: 32 % — ABNORMAL LOW (ref 39.0–52.0)
Hemoglobin: 10.9 g/dL — ABNORMAL LOW (ref 13.0–17.0)
Potassium: 5.4 mEq/L — ABNORMAL HIGH (ref 3.5–5.1)
Sodium: 139 mEq/L (ref 135–145)

## 2010-08-19 LAB — GLUCOSE, CAPILLARY
Glucose-Capillary: 84 mg/dL (ref 70–99)
Glucose-Capillary: 93 mg/dL (ref 70–99)

## 2010-09-22 NOTE — Op Note (Signed)
NAME:  [name], [name]                 ACCOUNT NO.:  [id number]   MEDICAL RECORD NO.:  [id number]          PATIENT TYPE:  AMB   LOCATION:  SDS                          FACILITY:  [facility]   PHYSICIAN:  Nelda Severe. Kellie Simmering, M.D.  DATE OF BIRTH:  1956-02-29   DATE OF PROCEDURE:  08/22/2008  DATE OF DISCHARGE:  08/22/2008                               OPERATIVE REPORT   PREOPERATIVE DIAGNOSIS:  End-stage renal disease.   POSTOPERATIVE DIAGNOSIS:  End-stage renal disease.   OPERATION:  Insertion of left forearm brachial artery to brachial vein  arteriovenous Gore-Tex graft (4 mm x 7 mm stretch).   SURGEON:  Nelda Severe. Kellie Simmering, MD   FIRST ASSISTANT:  Nurse.   ANESTHESIA:  Local.   PROCEDURE IN DETAIL:  The patient was taken to the operating room and  placed in the supine position at which time the left upper extremity was  prepped with Betadine scrub and solution and draped in routine sterile  manner.  After infiltration with 1% Xylocaine, a transverse incision was  made in the antecubital area and antecubital vein dissected free.  Cephalic branch was quite small and unacceptable.  Basilic branch was  slightly larger but small, also in 2.5-3 mm range.  Brachial artery was  exposed beneath the fascia and it had an adjacent brachial vein which  was 5 mm in size.  A loop-shaped tunnel was then created after  infiltration of 1% Xylocaine.  A 4 x 7 mm stretch Gore-Tex graft  delivered through the loop with the arterial limb being lateral and  venous end being medial.  No heparin was given.  Artery was occluded  proximally and distally with vessel loops, opened with #15 blade, and  extended with Potts scissors.  The 4-mm end of the graft was spatulated  and anastomosed end-to-side with 6-0 Prolene.  The 7-mm end of the graft  was then anastomosed end-to-side to the brachial vein with 6-0 Prolene.  Clamps were released.  There was an excellent pulse and thrill in the  graft with audible Doppler flow in  the radial and ulnar artery with the  graft open.  Wounds were irrigated with saline, closed in layers with  Vicryl in subcuticular fashion, and sterile dressing applied.  The  patient was taken to the recovery room in satisfactory condition.      Nelda Severe Kellie Simmering, M.D.  Electronically Signed     JDL/MEDQ  D:  08/22/2008  T:  08/23/2008  Job:  [id number]   cc:   Melburn Hake, M.D.

## 2010-09-22 NOTE — Procedures (Signed)
CEPHALIC VEIN MAPPING   INDICATION:  Preop evaluation for AV fistula placement.   HISTORY:  Kidney disease.   EXAM:  The right cephalic vein is compressible.   Diameter measurements range from 0.17 to 0.35 cm.   The left cephalic vein is compressible with diameter measurements  ranging from 0.14 to 0.2 cm.   See attached worksheet for all measurements.   IMPRESSION:  Patent bilateral cephalic veins with diameter measurements  as described above and on the attached worksheet.   ___________________________________________  Nelda Severe. Kellie Simmering, M.D.   CH/MEDQ  D:  08/07/2008  T:  08/07/2008  Job:  [id number]

## 2010-09-22 NOTE — Consult Note (Signed)
NEW PATIENT CONSULTATION   [name], Randy J  DOB:  06-17-1955                                       08/06/2008  [id number]   The patient is referred for vascular access by Dr. Mare Ferrari for end-stage  renal disease secondary to polycystic kidney.  He has not been on  hemodialysis yet but apparently it is rapidly approaching.  He is right-  handed.  He has a history of insulin dependent diabetes mellitus,  hypertension and hyperlipidemia.   PHYSICAL EXAMINATION:  On exam today his blood pressure is 190/110,  heart rate is 84, respirations 14.  He has excellent arterial pulses in  both upper extremities, 3+ at the brachial and radial level.  Cephalic  veins are quite small on physical exam and bilateral vein mapping  confirmed this with inadequate size for creation of fistula bilaterally.   He will need a graft and we will place that in his left forearm  initially and we scheduled that for Friday, April 9 as an outpatient.  Hopefully this will provide a satisfactory site for vascular access for  this nice man.   Nelda Severe Kellie Simmering, M.D.  Electronically Signed   JDL/MEDQ  D:  08/06/2008  T:  08/07/2008  Job:  [id number]   cc:   Melburn Hake, M.D.

## 2010-09-22 NOTE — Discharge Summary (Signed)
NAMEJUANE, [name]                 ACCOUNT NO.:  [id number]   MEDICAL RECORD NO.:  [id number]          PATIENT TYPE:  INP   LOCATION:  6706                         FACILITY:  [facility]   PHYSICIAN:  Melburn Hake, M.D.DATE OF BIRTH:  05/21/1955   DATE OF ADMISSION:  10/31/2008  DATE OF DISCHARGE:  11/05/2008                               DISCHARGE SUMMARY   ADMITTING DIAGNOSES:  1. Shortness of breath with some congestion.  2. Severe hypertension.  3. End-stage renal disease in a diabetic hypertensive patient   DISCHARGE DIAGNOSES:  1. End-stage renal disease, starting dialysis.  2. Hypertension.  3. Diabetes type 2.  4. Hepatitis C.   BRIEF HISTORY AND PHYSICAL AND HOSPITAL COURSE:  This is an admission  for this 55 year old African American male with failing kidneys.  He has  a history of hypertension, type 2 diabetes, and has had disability from  an injury from a fall on his jaw in 1999.  He lived in [place].  He  moved to [place] around 2002.  He is widowed and has 5 children.  His  mother is still living.  His father is deceased, he had hypertension.  The patient's renal function has gradually diminished over the past 8  years.  He has done fairly well and over the past 2 years, his renal  function has deteriorated.  Blood pressure control has become very  difficult.  The patient has a history of polycystic kidney disease as  well as hypertension and diabetes.  Physical exam revealed a temperature  of 97.6, pulse of 78, respirations 20, blood pressure 166/131, 98% on  room air.  HEENT was negative.  Neck veins were full.  The patient had  some rales at both bases of his lungs.  There was no gallop.  Active  bowel sounds.  There was no mass, no organomegaly in his abdomen.  He  could move all his extremities.  His white count is 11.3, hemoglobin  8.7, hematocrit 26.2.  LDL cholesterol was 123, BUN was 67 with a  creatinine of 11.78, potassium of 5.4.  The patient had  has had an early  AV graft put in his left arm, which is secured and is ready for use.  The patient started dialysis on November 01, 2008.  His weight prevoid was  76.1 and postvoid after his last dialysis at 74.1 kg.  His blood  pressure has come down.  Today, his blood pressure is 126/72 post, pulse  was 76, respirations 17, 99% on room air.  The patient started this  point, he is dialyzing 3 hours __________ and his blood flow rate is  300, dialysis flow rate is 500.  The patient still passes some urine  which is decreasing rapidly.  His medicines, he is on insulin NovoLog  70/30.  He takes 30 units in the morning and 25 in the p.m.  His  pressure medicines include Catapres 0.3 mg twice a day, Coreg 25 mg  twice a day, and Cozaar 100 mg daily.  __________ renal vitamins, Nephro-  Vite 1 tablet daily, Zocor  40 mg daily.  He has trouble sleeping at  night, so he was put on 5 mg of Ambien given to take as needed at night  for sleep.  He was given PhosLo 667 mg 1 three times a day with each  meal for his phosphorus, now be readjusted as we go along.  Since he  started eating, he has not had any problems with nausea, so he does need  any Phenergan at this point in time.  Since the patient has been started  on dialysis, he is so much improved and the  patient was dialyzed today.  Today's temperature was 98.6, pulse was 76,  respirations 18, blood pressure 128/81, 100% on room air.  The patient's  is being discharged home and he will dialyze at [facility] in the a.m. on  Tuesday, Thursday, and Saturday.  He has had four treatments already.           ______________________________  Melburn Hake, M.D.     CEF/MEDQ  D:  11/05/2008  T:  11/06/2008  Job:  [id number]
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
## 2010-09-25 NOTE — Op Note (Signed)
[name], [name]                             ACCOUNT NO.:  [id number]   MEDICAL RECORD NO.:  [id number]                   PATIENT TYPE:  INP   LOCATION:  X002                                 FACILITY[facility]  [facility]   PHYSICIAN:  John L. Rendall III, M.D.           DATE OF BIRTH:  01/10/1956   DATE OF PROCEDURE:  12/11/2003  DATE OF DISCHARGE:                                 OPERATIVE REPORT   PREOPERATIVE DIAGNOSES:  Osteoarthritis, left knee.   POSTOPERATIVE DIAGNOSES:  Osteoarthritis, left knee.   PROCEDURE:  Left LCS total knee replacement.   SURGEON:  John L. Rendall, M.D.   ASSISTANT:  Vonita Moss. Duffy, P.A.-C.   ANESTHESIA:  General with femoral nerve block.   DESCRIPTION OF PROCEDURE:  Under general anesthesia, the left leg was  prepared with duraprep and draped as a sterile field. A sterile tourniquet  is placed high on the thigh, the leg is wrapped out with the esmarch and the  tourniquet is used to 250 mm.  A midline incision is made, the patella is  everted, the femur is sized at a standard plus. Using the first tibial  guide, a proximal tibial resection is carried out. Using the first femoral  guide, an intercondylar drill hole is placed.  Using the second femoral  guide, an attempt to balance the flexion gap is barely possible due to  tightness of the flexion box.  Proximal tibial resection was then redone  bringing it down further 5 mm to get an appropriate 10 mm flexion gap. The  intramedullary guide was then used and after anterior and posterior flare of  the distal femur were resected and took several tries to get the distal  femoral cut right for a 10 mm extension gap, recessing guide was then used.  A laminar spreader was then inserted, remnants of the cruciate's and menisci  were resected. Bone spurs were taken down. The spurs off the back of the  femoral condyle were resected.  At this point, [name] and [name] were  inserted, the proximal tibia was sized at a #3  and the center peg hole with  fins was placed.  Trial reduction of a #3 tibia, 10 baring and standard plus  femur revealed excellent fit, alignment and stability. The patella was then  osteotomized and three peg holes placed. With the patellar trial in place,  everything fit very well with excellent range of motion and good stability.  Permanent components were obtained, the knee was washed out with pulse  irrigation. All permanent components were cemented in place, tourniquet was  let down. When cement hardened at exactly one hour, multiple small vessels  were cauterized, a medium Hemovac was inserted and the knee was then closed  in layers with #1 Tycron, 0 and 2-0 Vicryl and skin clips.  Operative time  approximately an hour and 20 minutes.  The patient tolerated the procedure  well and returned to recovery in good condition.                                               John L. Wynona Luna, M.D.    Judie Grieve  D:  12/11/2003  T:  12/11/2003  Job:  [id number]

## 2010-09-25 NOTE — Discharge Summary (Signed)
[name], [name]                             ACCOUNT NO.:  [id number]   MEDICAL RECORD NO.:  [id number]                   PATIENT TYPE:  INP   LOCATION:  [place]                                 FACILITY:  [facility]   PHYSICIAN:  John L. Rendall III, M.D.           DATE OF BIRTH:  22-Jun-1955   DATE OF ADMISSION:  12/11/2003  DATE OF DISCHARGE:  12/14/2003                                 DISCHARGE SUMMARY   ADMISSION DIAGNOSES:  1.  Left knee bone-on-bone end-stage osteoarthritis.  2.  Diabetes mellitus, insulin dependent.  3.  Hypertension.  4.  Dyslipidemia.   DISCHARGE DIAGNOSES:  1.  Status post left total knee arthroplasty.  2.  Acute blood loss anemia secondary to surgery, asymptomatic.  3.  Type 2 diabetes, insulin dependent.  4.  Hypertension.  5.  Acute renal failure.  6.  Chronic renal insufficiency secondary to hypertension.  7.  Diabetes mellitus.  8.  Leukocytosis trending down.   HISTORY OF PRESENT ILLNESS:  [name] is a 55 year old African American male  with left knee pain since 1999. The patient reports that he while working  and struck his left knee on concrete in 1999. The patient underwent a left  knee arthroscopy in [place] in 1999 and since that time has had what he  describes as an intermittent pain that is pinching in nature, associated  with swelling. Mechanical symptoms positive for giving way. The pain does  not awaken him at night, nor does it radiate down the leg. He uses a cane at  times to ambulate. He takes Vicodin that helps with his left knee pain. He  wears a brace to keep the knee from giving way. X-rays of the left knee show  bone-on-bone osteoarthritis.   ALLERGIES:  No known drug allergies.   MEDICATIONS:  1.  Catapres TTS 0.3 mg patch every Friday.  2.  Actos 45 mg one daily.  3.  Potassium chloride 20 mEq one daily.  4.  Furosemide 80 mg one daily.  5.  Lotrel 10/20 mg one daily.  6.  Coreg 12.5 mg one b.i.d.  7.  Novolin 70/30  insulin subcu 35 units q.a.m. and 25 units q.p.m.  8.  Sulindac 200 mg one tablet b.i.d. with food.  9.  Viagra 50 mg one p.o.   SURGICAL PROCEDURE:  The patient was taken to the operating room on December 11, 2003, by Dr. Telford Nab, assisted by Zenaida Deed, PA-C. The patient was  placed under general anesthesia with a femoral nerve block. A left total  knee replacement was performed. The following components were used: Size 3  tibia, 3-mm bearings, stay in place femur, and a three-peg hole patella. The  patient tolerated the procedure well and returned to the recovery room in  good and stable condition.   CONSULTATIONS:  The following consults were obtained while the patient was  hospitalized: [facility]; PT/OT; case management.   On postoperative day one the patient was found to have chronic renal  insufficiency/acute renal insufficiency that was felt to be exacerbated by  blood loss; however, preadmission labs showed BUN to be elevated at 32 and  creatinine to be 2.3. The patient was also reported to have hyperlipidemia  questionably due to patient was on no lipid medication preoperatively;  therefore, a fasting lipid profile was then checked. On postoperative day  two the patient had left calf pain, therefore a Doppler was done and was  negative for DVT. On postoperative day two, the patient's BUN was 26,  creatinine 2.1. ACE and K-Dur were held. On postoperative day three, the  patient was afebrile, vital signs were stable. The patient's white count was  trending down to 12,900. BUN was 32, creatinine 2.3. The patient's calf pain  had improved and it was felt that he could be discharged in good and stable  condition, and be followed by Dr. Mare Ferrari for his chronic renal  failure/acute renal insufficiency.   LABORATORY DATA:  Routine labs on admission to include a CBC revealed a  white count of 6.3, hemoglobin 14.2, hematocrit 42.3, platelet count  288,000. At the time of  discharge, hemoglobin was 10.9, hematocrit 31.9.  Coags on admission revealed a PT of 11.2, INR 0.8, PTT 24.   Routine chemistries on admission revealed sodium of 137, potassium 3.8,  chloride 105, bicarbonate 26, glucose 231, BUN 32, creatinine 2.3. At the  time of discharge, BUN was 32 and creatinine 2.3. Hepatic enzymes on  admission within normal limits.   Fasting lipid studies performed on December 12, 2003, revealed cholesterol of  228, triglycerides 64, HDL 59, total cholesterol and HDL ratio of 3.9, HDL  cholesterol of 13, LDL cholesterol of 56.  Urinalysis on admission was  normal except for a glucose which was elevated at 250 mg/dl.   Two-view chest x-ray performed on December 05, 2003, showed no active disease.  EKG on December 05, 2003, showed normal sinus rhythm with heart rate of 81 beats  per minute, PR interval of 76 milliseconds. Doppler performed on December 13, 2003, showed no evidence of DVT, superficial thrombus, or Baker's cyst.   DISCHARGE INSTRUCTIONS:  The patient is to resume home medications except  for Lotrel and Sulindac. Norvasc is to be changed to 10 mg daily.  The patient is to add the following medications: Arixtra 2.5 mg subcu  injected at 8 p.m. daily with last dose to be on December 16, 2003; OxyContin  20 mg one tablet q.12h.; Percocet 5/325 mg one to two tablets q.4-6h. p.r.n.  breakthrough pain, Robaxin 500 mg one to two tablets q.6h. p.r.n. spasm.   ACTIVITY:  The patient is weight bearing as tolerated with a walker.   DIET:  No restrictions.   WOUND CARE:  The patient is to keep wound clean and dry. He may shower after  two days of no drainage from the wound. The patient is to call our office if  any signs of infection are noted.   SPECIAL INSTRUCTIONS:  1.  CPM 0-90 degrees six to eight hours a day, increase by 10 degrees daily.  2.  Home health nurse to draw CBC on December 17, 2003, and is to fax results      to [phone].  FOLLOWUP:  The patient needs to  follow up with Dr. Telford Nab on December 24, 2003. The patient is to call our office at  [phone] to make an appointment.  The patient is to call follow up with Dr. Mare Ferrari in seven to ten days,  follow up on diabetes, blood pressure, dyslipidemia, and chronic versus  acute renal insufficiency.     Erskine Emery, P.A.                       John L. Wynona Luna, M.D.    Darlin Priestly  D:  01/07/2004  T:  01/07/2004  Job:  [id number]   cc:   Jenny Reichmann L. Rendall III, M.D.  [address] [place]  [place] [address]  Fax: [phone]   Melburn Hake, M.D.  [address] [place]  [place] [address]  Fax: [phone]

## 2010-10-06 ENCOUNTER — Encounter: Payer: Medicare Other | Attending: Nephrology | Admitting: Dietician

## 2010-10-06 DIAGNOSIS — N186 End stage renal disease: Secondary | ICD-10-CM | POA: Insufficient documentation

## 2010-10-06 DIAGNOSIS — E119 Type 2 diabetes mellitus without complications: Secondary | ICD-10-CM | POA: Insufficient documentation

## 2010-10-06 DIAGNOSIS — Z713 Dietary counseling and surveillance: Secondary | ICD-10-CM | POA: Insufficient documentation

## 2011-01-04 ENCOUNTER — Other Ambulatory Visit (HOSPITAL_COMMUNITY): Payer: Self-pay | Admitting: Nephrology

## 2011-01-04 DIAGNOSIS — R1110 Vomiting, unspecified: Secondary | ICD-10-CM

## 2011-01-12 ENCOUNTER — Other Ambulatory Visit (HOSPITAL_COMMUNITY): Payer: Medicare Other

## 2011-01-19 ENCOUNTER — Ambulatory Visit (HOSPITAL_COMMUNITY)
Admission: RE | Admit: 2011-01-19 | Discharge: 2011-01-19 | Disposition: A | Discharge status: Home / Self Care | Payer: Medicare Other | Source: Ambulatory Visit | Attending: Nephrology | Admitting: Nephrology

## 2011-01-19 DIAGNOSIS — R1110 Vomiting, unspecified: Secondary | ICD-10-CM

## 2011-01-19 DIAGNOSIS — R109 Unspecified abdominal pain: Secondary | ICD-10-CM | POA: Insufficient documentation

## 2011-01-19 DIAGNOSIS — K920 Hematemesis: Secondary | ICD-10-CM | POA: Insufficient documentation

## 2011-02-18 ENCOUNTER — Other Ambulatory Visit (HOSPITAL_COMMUNITY): Payer: Medicare Other

## 2011-02-22 LAB — I-STAT 8, (EC8 V) (CONVERTED LAB)
Acid-base deficit: 3 — ABNORMAL HIGH
BUN: 29 — ABNORMAL HIGH
Bicarbonate: 25.2 — ABNORMAL HIGH
Chloride: 107
Glucose, Bld: 121 — ABNORMAL HIGH
HCT: 49
Hemoglobin: 16.7
Operator id: 277751
Potassium: 3.4 — ABNORMAL LOW
Sodium: 140
TCO2: 27
pCO2, Ven: 54.3 — ABNORMAL HIGH
pH, Ven: 7.274

## 2011-02-22 LAB — POCT I-STAT CREATININE
Creatinine, Ser: 4 — ABNORMAL HIGH
Operator id: 277751

## 2011-02-25 ENCOUNTER — Other Ambulatory Visit (HOSPITAL_COMMUNITY): Payer: Self-pay | Admitting: Nephrology

## 2011-02-25 ENCOUNTER — Ambulatory Visit (HOSPITAL_COMMUNITY)
Admission: RE | Admit: 2011-02-25 | Discharge: 2011-02-25 | Disposition: A | Discharge status: Home / Self Care | Payer: Medicare Other | Source: Ambulatory Visit | Attending: Nephrology | Admitting: Nephrology

## 2011-02-25 ENCOUNTER — Other Ambulatory Visit (HOSPITAL_COMMUNITY): Payer: Medicare Other

## 2011-02-25 ENCOUNTER — Ambulatory Visit (HOSPITAL_COMMUNITY): Admission: RE | Admit: 2011-02-25 | Payer: Medicare Other | Source: Ambulatory Visit

## 2011-02-25 DIAGNOSIS — R109 Unspecified abdominal pain: Secondary | ICD-10-CM | POA: Insufficient documentation

## 2011-02-25 DIAGNOSIS — R1110 Vomiting, unspecified: Secondary | ICD-10-CM

## 2011-02-25 DIAGNOSIS — R112 Nausea with vomiting, unspecified: Secondary | ICD-10-CM | POA: Insufficient documentation

## 2011-03-11 ENCOUNTER — Other Ambulatory Visit (HOSPITAL_COMMUNITY): Payer: Medicare Other

## 2011-04-11 ENCOUNTER — Emergency Department (HOSPITAL_COMMUNITY): Payer: Medicare Other

## 2011-04-11 ENCOUNTER — Encounter: Payer: Self-pay | Admitting: Emergency Medicine

## 2011-04-11 ENCOUNTER — Inpatient Hospital Stay (HOSPITAL_COMMUNITY)
Admission: EM | Admit: 2011-04-11 | Discharge: 2011-04-14 | DRG: 291 | Disposition: A | Discharge status: Home / Self Care | Payer: Medicare Other | Attending: Internal Medicine | Admitting: Internal Medicine

## 2011-04-11 DIAGNOSIS — E1165 Type 2 diabetes mellitus with hyperglycemia: Secondary | ICD-10-CM | POA: Diagnosis present

## 2011-04-11 DIAGNOSIS — I509 Heart failure, unspecified: Secondary | ICD-10-CM | POA: Diagnosis present

## 2011-04-11 DIAGNOSIS — J811 Chronic pulmonary edema: Secondary | ICD-10-CM | POA: Diagnosis present

## 2011-04-11 DIAGNOSIS — Z992 Dependence on renal dialysis: Secondary | ICD-10-CM

## 2011-04-11 DIAGNOSIS — IMO0002 Reserved for concepts with insufficient information to code with codable children: Secondary | ICD-10-CM | POA: Diagnosis present

## 2011-04-11 DIAGNOSIS — I10 Essential (primary) hypertension: Secondary | ICD-10-CM | POA: Diagnosis present

## 2011-04-11 DIAGNOSIS — R739 Hyperglycemia, unspecified: Secondary | ICD-10-CM

## 2011-04-11 DIAGNOSIS — R0602 Shortness of breath: Secondary | ICD-10-CM | POA: Diagnosis present

## 2011-04-11 DIAGNOSIS — I120 Hypertensive chronic kidney disease with stage 5 chronic kidney disease or end stage renal disease: Secondary | ICD-10-CM | POA: Diagnosis present

## 2011-04-11 DIAGNOSIS — I5033 Acute on chronic diastolic (congestive) heart failure: Principal | ICD-10-CM | POA: Diagnosis present

## 2011-04-11 DIAGNOSIS — N186 End stage renal disease: Secondary | ICD-10-CM | POA: Diagnosis present

## 2011-04-11 DIAGNOSIS — I501 Left ventricular failure: Secondary | ICD-10-CM

## 2011-04-11 DIAGNOSIS — E118 Type 2 diabetes mellitus with unspecified complications: Secondary | ICD-10-CM | POA: Diagnosis present

## 2011-04-11 HISTORY — DX: Essential (primary) hypertension: I10

## 2011-04-11 HISTORY — DX: Unspecified kidney failure: N19

## 2011-04-11 HISTORY — DX: Disorder of kidney and ureter, unspecified: N28.9

## 2011-04-11 LAB — BASIC METABOLIC PANEL
BUN: 32 mg/dL — ABNORMAL HIGH (ref 6–23)
CO2: 29 mEq/L (ref 19–32)
Calcium: 10.3 mg/dL (ref 8.4–10.5)
Chloride: 94 mEq/L — ABNORMAL LOW (ref 96–112)
Creatinine, Ser: 10.21 mg/dL — ABNORMAL HIGH (ref 0.50–1.35)
GFR calc Af Amer: 6 mL/min — ABNORMAL LOW (ref >90)
GFR calc non Af Amer: 5 mL/min — ABNORMAL LOW (ref >90)
Glucose, Bld: 347 mg/dL — ABNORMAL HIGH (ref 70–99)
Potassium: 3.5 mEq/L (ref 3.5–5.1)
Sodium: 136 mEq/L (ref 135–145)

## 2011-04-11 LAB — CBC
HCT: 29.5 % — ABNORMAL LOW (ref 39.0–52.0)
Hemoglobin: 10 g/dL — ABNORMAL LOW (ref 13.0–17.0)
MCH: 28.7 pg (ref 26.0–34.0)
MCHC: 33.9 g/dL (ref 30.0–36.0)
MCV: 84.5 fL (ref 78.0–100.0)
Platelets: 219 10*3/uL (ref 150–400)
RBC: 3.49 MIL/uL — ABNORMAL LOW (ref 4.22–5.81)
RDW: 13.1 % (ref 11.5–15.5)
WBC: 10 10*3/uL (ref 4.0–10.5)

## 2011-04-11 LAB — PRO B NATRIURETIC PEPTIDE: Pro B Natriuretic peptide (BNP): 24472 pg/mL — ABNORMAL HIGH (ref 0–125)

## 2011-04-11 LAB — GLUCOSE, CAPILLARY: Glucose-Capillary: 269 mg/dL — ABNORMAL HIGH (ref 70–99)

## 2011-04-11 LAB — TROPONIN I: Troponin I: <0.3 ng/mL (ref <0.30)

## 2011-04-11 MED ORDER — CLONIDINE HCL 0.1 MG PO TABS
0.1000 mg | ORAL_TABLET | Freq: Once | ORAL | Status: AC
  Administered 2011-04-11: 0.1 mg via ORAL
  Filled 2011-04-11: qty 1

## 2011-04-11 MED ORDER — ENOXAPARIN SODIUM 30 MG/0.3ML ~~LOC~~ SOLN
30.0000 mg | SUBCUTANEOUS | Status: DC
  Administered 2011-04-12: 30 mg via SUBCUTANEOUS
  Filled 2011-04-11 (×2): qty 0.3

## 2011-04-11 MED ORDER — INSULIN ASPART 100 UNIT/ML ~~LOC~~ SOLN
0.0000 [IU] | Freq: Every day | SUBCUTANEOUS | Status: DC
  Administered 2011-04-12: 4 [IU] via SUBCUTANEOUS

## 2011-04-11 MED ORDER — NITROGLYCERIN 2 % TD OINT
1.0000 [in_us] | TOPICAL_OINTMENT | Freq: Once | TRANSDERMAL | Status: AC
  Administered 2011-04-11: 1 [in_us] via TOPICAL
  Filled 2011-04-11: qty 30

## 2011-04-11 MED ORDER — FUROSEMIDE 10 MG/ML IJ SOLN
80.0000 mg | Freq: Once | INTRAMUSCULAR | Status: AC
  Administered 2011-04-11: 80 mg via INTRAVENOUS
  Filled 2011-04-11 (×2): qty 8

## 2011-04-11 MED ORDER — ASPIRIN 81 MG PO CHEW
324.0000 mg | CHEWABLE_TABLET | Freq: Once | ORAL | Status: AC
  Administered 2011-04-11: 324 mg via ORAL
  Filled 2011-04-11: qty 3
  Filled 2011-04-11: qty 1

## 2011-04-11 MED ORDER — NITROGLYCERIN 2 % TD OINT
1.0000 [in_us] | TOPICAL_OINTMENT | Freq: Four times a day (QID) | TRANSDERMAL | Status: DC
  Administered 2011-04-12 (×2): 1 [in_us] via TOPICAL
  Filled 2011-04-11: qty 30

## 2011-04-11 MED ORDER — INSULIN ASPART 100 UNIT/ML ~~LOC~~ SOLN
0.0000 [IU] | Freq: Three times a day (TID) | SUBCUTANEOUS | Status: DC
  Administered 2011-04-12: 2 [IU] via SUBCUTANEOUS
  Administered 2011-04-12: 9 [IU] via SUBCUTANEOUS
  Administered 2011-04-13: 2 [IU] via SUBCUTANEOUS
  Administered 2011-04-13: 5 [IU] via SUBCUTANEOUS
  Administered 2011-04-13: 3 [IU] via SUBCUTANEOUS
  Administered 2011-04-14: 7 [IU] via SUBCUTANEOUS
  Filled 2011-04-11: qty 3

## 2011-04-11 MED ORDER — IOHEXOL 300 MG/ML  SOLN
100.0000 mL | Freq: Once | INTRAMUSCULAR | Status: AC | PRN
  Administered 2011-04-11: 100 mL via INTRAVENOUS

## 2011-04-11 MED ORDER — ASPIRIN 325 MG PO TABS
325.0000 mg | ORAL_TABLET | Freq: Every day | ORAL | Status: DC
  Administered 2011-04-12 – 2011-04-14 (×3): 325 mg via ORAL
  Filled 2011-04-11 (×3): qty 1

## 2011-04-11 MED ORDER — CLONIDINE HCL 0.1 MG PO TABS
0.1000 mg | ORAL_TABLET | Freq: Four times a day (QID) | ORAL | Status: DC | PRN
  Filled 2011-04-11 (×2): qty 1

## 2011-04-11 NOTE — H&P (Addendum)
DATE OF ADMISSION:  04/11/2011  PCP:    Melburn Hake, MD, MD   Chief Complaint:    HPI: [name] is an 55 y.o. male  with a history of   ESRD on Hemodialysis on Monday, Wednesday, and Fridays, who presents to the [place] ED secondary to complaints of Worsening SOB over the past 2 days, he denies Chest Pain, reports a dry cough, denies fevers or chills.  Patient denies missing any dialysis.  His ER evaluation revealed Pulmonary Edema on chest X-Ray, and on the CTA of the Chest.  His BNP= 24, 472.0.     Past Medical History  Diagnosis Date  . Renal disorder   . Diabetes mellitus   . Hypertension   . Dialysis care   . Renal failure     History reviewed. No pertinent past surgical history.  Medications:    HOME MEDS: Prior to Admission medications   Medication Sig Start Date End Date Taking? Authorizing Provider  calcium acetate (PHOSLO) 667 MG capsule Take 2,001 mg by mouth 3 (three) times daily with meals.     Yes Historical Provider, MD  carvedilol (COREG) 25 MG tablet Take 25 mg by mouth 2 (two) times daily with a meal.     Yes Historical Provider, MD  cloNIDine (CATAPRES - DOSED IN MG/24 HR) 0.3 mg/24hr Place 1 patch onto the skin once a week.     Yes Historical Provider, MD  insulin aspart protamine-insulin aspart (NOVOLOG 70/30) (70-30) 100 UNIT/ML injection Inject 25-35 Units into the skin 2 (two) times daily with a meal. Injects 35 units every morning and injects 25 units every evening    Yes Historical Provider, MD  losartan (COZAAR) 50 MG tablet Take 50 mg by mouth daily.     Yes Historical Provider, MD  multivitamin (RENA-VIT) TABS tablet Take 1 tablet by mouth daily.     Yes Historical Provider, MD    Allergies:  No Known Allergies  Social History:   does not have a smoking history on file. He does not have any smokeless tobacco history on file. He reports that he does not drink alcohol or use illicit drugs.  Family History: History reviewed. No pertinent  family history.  Review of Systems:  The patient denies anorexia, fever, weight loss,, vision loss, decreased hearing, hoarseness, chest pain, syncope, dyspnea on exertion, peripheral edema, balance deficits, hemoptysis, abdominal pain, melena, hematochezia, severe indigestion/heartburn, hematuria, incontinence, genital sores, muscle weakness, suspicious skin lesions, transient blindness, difficulty walking, depression, unusual weight change, abnormal bleeding, enlarged lymph nodes, angioedema, and breast masses.   Physical Exam:  GEN: Pleasant 55 year old well nourished well developed African American male examined  and in no acute distress; cooperative with exam Filed Vitals:   04/11/11 1835 04/11/11 1837 04/11/11 2001 04/11/11 2148  BP: 199/102 187/110 183/103 185/108  Pulse: 99  99 92  Temp:   99.1 F (37.3 C)   TempSrc:   Oral   Resp:   20 18  Height: 5\' 11"  (1.803 m)     Weight: 87 kg (191 lb 12.8 oz)     SpO2: 98%  98% 97%   Blood pressure 185/108, pulse 92, temperature 99.1 F (37.3 C), temperature source Oral, resp. rate 18, height 5\' 11"  (1.803 m), weight 87 kg (191 lb 12.8 oz), SpO2 97.00%. PSYCH: He is alert and oriented x4; does not appear anxious does not appear depressed; affect is normal HEENT: Normocephalic and Atraumatic, Mucous membranes pink; PERRLA; EOM intact;  Fundi:  Benign;  No scleral icterus, Nares: Patent, Oropharynx: Clear, Fair Dentition, Neck:  FROM, no cervical lymphadenopathy nor thyromegaly or carotid bruit; no JVD; Breasts:: Not examined CHEST WALL: No tenderness CHEST: Normal respiration, clear to auscultation bilaterally HEART: Regular rate and rhythm; no murmurs rubs or gallops BACK: No kyphosis or scoliosis; no CVA tenderness ABDOMEN: Positive Bowel Sounds, Obese, soft non-tender; no masses, no organomegaly. Rectal Exam: Not done EXTREMITIES: No bone or joint deformity; age-appropriate arthropathy of the hands and knees; no cyanosis, clubbing or  edema; no ulcerations. Genitalia: not examined PULSES: 2+ and symmetric SKIN: Normal hydration no rash or ulceration CNS: Cranial nerves 2-12 grossly intact no focal neurologic deficit   Labs & Imaging Results for orders placed during the hospital encounter of 04/11/11 (from the past 48 hour(s))  TROPONIN I     Status: Normal   Collection Time   04/11/11  5:50 PM      Component Value Range Comment   Troponin I <0.30  <0.30 (ng/mL)   CBC     Status: Abnormal   Collection Time   04/11/11  5:50 PM      Component Value Range Comment   WBC 10.0  4.0 - 10.5 (K/uL)    RBC 3.49 (*) 4.22 - 5.81 (MIL/uL)    Hemoglobin 10.0 (*) 13.0 - 17.0 (g/dL)    HCT 29.5 (*) 39.0 - 52.0 (%)    MCV 84.5  78.0 - 100.0 (fL)    MCH 28.7  26.0 - 34.0 (pg)    MCHC 33.9  30.0 - 36.0 (g/dL)    RDW 13.1  11.5 - 15.5 (%)    Platelets 219  150 - 400 (K/uL)   BASIC METABOLIC PANEL     Status: Abnormal   Collection Time   04/11/11  5:50 PM      Component Value Range Comment   Sodium 136  135 - 145 (mEq/L)    Potassium 3.5  3.5 - 5.1 (mEq/L)    Chloride 94 (*) 96 - 112 (mEq/L)    CO2 29  19 - 32 (mEq/L)    Glucose, Bld 347 (*) 70 - 99 (mg/dL)    BUN 32 (*) 6 - 23 (mg/dL)    Creatinine, Ser 10.21 (*) 0.50 - 1.35 (mg/dL)    Calcium 10.3  8.4 - 10.5 (mg/dL)    GFR calc non Af Amer 5 (*) >90 (mL/min)    GFR calc Af Amer 6 (*) >90 (mL/min)   PRO B NATRIURETIC PEPTIDE     Status: Abnormal   Collection Time   04/11/11  5:50 PM      Component Value Range Comment   BNP, POC 24472.0 (*) 0 - 125 (pg/mL)    Dg Chest 2 View  04/11/2011  *RADIOLOGY REPORT*  Clinical Data: Shortness of breath.  CHEST - 2 VIEW  Comparison: 12/19/2009  Findings: There is cardiomegaly with vascular congestion. Bibasilar opacities and small effusions.  Slight interstitial prominence.  No acute bony abnormality.  IMPRESSION: Cardiomegaly with bibasilar atelectasis or infiltrates and small effusions.  Slight interstitial prominence could reflect  early interstitial edema.  Original Report Authenticated By: Raelyn Number, M.D.   Ct Angio Chest W/cm &/or Wo Cm  04/11/2011  *RADIOLOGY REPORT*  Clinical Data:  Exertional shortness of breath.  Question pulmonary embolism.  End-stage renal disease on chronic dialysis.  CT ANGIOGRAPHY CHEST WITH CONTRAST  Technique:  Multidetector CT imaging of the chest was performed using the standard protocol during bolus  administration of intravenous contrast.  Multiplanar CT image reconstructions including MIPs were obtained to evaluate the vascular anatomy.  Contrast: 119mL OMNIPAQUE IOHEXOL 300 MG/ML IV SOLN  Comparison:  Chest radiographs same date.  Findings:  The pulmonary arteries are well opacified with contrast. There is no evidence of acute pulmonary embolism.  The aorta and great vessels appear normal.  There is a small hiatal hernia.  No enlarged mediastinal or hilar lymph nodes are present.  There are small bilateral pleural effusions.  There is no pericardial effusion.  The lungs demonstrate mild dependent atelectasis bilaterally.  In addition, there are diffuse perihilar ground-glass opacities bilaterally.  The visualized upper abdomen appears unremarkable.  Mild gynecomastia is noted bilaterally.  Review of the MIP images confirms the above findings.  IMPRESSION:  1.  No evidence of acute pulmonary embolism. 2.  Bilateral perihilar ground-glass opacities and pleural effusions suspicious for pulmonary edema.  Atypical infection is considered less likely.  Original Report Authenticated By: Vivia Ewing, M.D.      Assessment: Present on Admission:  .SOB (shortness of breath) .Diastolic CHF, acute on chronic .CKD (chronic kidney disease) stage V requiring chronic dialysis .DM (diabetes mellitus), type 2, uncontrolled .HTN (hypertension)   Plan:    1.  Transfer for Admission to [facility] and admit to dialysis unit Tele bed. The CHF protocol has been initiated, and patient has been  placed  On Nitropaste, O2, and Aspirin therapy.  Lasix 80mg  IV X 1 dose was given in the ED.  And further treatment of his Pulmonary Edema will occur with Dialysis treatment to remove excess Fluid.   2.   Dialysis Team will be notified to continue the dialysis Schedule on Monday, Wednesdays and Fridays. 3.  Meds will be further reconciled 4.  PRN Clonidine for elevated blood pressures.   5.  DVT prophylaxis has been ordered. 6.  Other plans as per orders.    CODE STATUS:      FULL CODE        Yuan Gann C 04/11/2011, 9:56 PM

## 2011-04-11 NOTE — ED Provider Notes (Signed)
History     CSN: [id number] Arrival date & time: 04/11/2011  4:28 PM   First MD Initiated Contact with Patient 04/11/11 1636      Chief Complaint  Patient presents with  . Shortness of Breath    (Consider location/radiation/quality/duration/timing/severity/associated sxs/prior treatment) HPI History provided by pt.   Pt has had SOB for the past 2 days.  Aggravated by laying flat and exertion.  Associated w/ wheezing and non-productive cough.  Denies CP and peripheral edema.  Pt has h/o diabetes, HTN, ESRD (dialyzes M,W,F and has not missed an appt).  No known cardiac or pulmonary disease other than atrial fibrillation for which he is not anti-coagulated.  Recent long bus trip to [facility] but otherwise no RF for PE and denies LE pain/edema.  No known sick contacts.    Past Medical History  Diagnosis Date  . Renal disorder   . Diabetes mellitus   . Hypertension   . Dialysis care   . Renal failure     History reviewed. No pertinent past surgical history.  History reviewed. No pertinent family history.  History  Substance Use Topics  . Smoking status: Not on file  . Smokeless tobacco: Not on file  . Alcohol Use: No      Review of Systems  All other systems reviewed and are negative.    Allergies  Review of patient's allergies indicates no known allergies.  Home Medications   Current Outpatient Rx  Name Route Sig Dispense Refill  . CALCIUM ACETATE 667 MG PO CAPS Oral Take 2,001 mg by mouth 3 (three) times daily with meals.      Marland Kitchen CARVEDILOL 25 MG PO TABS Oral Take 25 mg by mouth 2 (two) times daily with a meal.      . CLONIDINE HCL 0.3 MG PO TABS Oral Take 0.3 mg by mouth 3 (three) times daily.      . INSULIN ASPART PROT & ASPART (70-30) 100 UNIT/ML [place] SUSP Subcutaneous Inject 25-35 Units into the skin 2 (two) times daily with a meal. Injects 35 units every morning and injects 25 units every evening     . LOSARTAN POTASSIUM 50 MG PO TABS Oral Take 50 mg by mouth daily.       Marland Kitchen RENA-VITE PO TABS Oral Take 1 tablet by mouth daily.        BP 200/106  Pulse 113  Temp(Src) 98.7 F (37.1 C) (Oral)  Resp 20  SpO2 97%  Physical Exam  Nursing note and vitals reviewed. Constitutional: He is oriented to person, place, and time. He appears well-developed and well-nourished. No distress.       Hypertensive  HENT:  Head: Normocephalic and atraumatic.  Eyes:       Normal appearance  Neck: Normal range of motion.  Cardiovascular: Regular rhythm.        Tachy at approx 110bmp  Pulmonary/Chest: Effort normal and breath sounds normal. No respiratory distress. He has no wheezes. He has no rales. He exhibits no tenderness.  Abdominal: Soft. Bowel sounds are normal. He exhibits no distension. There is no tenderness. There is no guarding.  Musculoskeletal: He exhibits no edema and no tenderness.       2+ Dorsalis Pedis pulses  Neurological: He is alert and oriented to person, place, and time.  Skin: Skin is warm and dry. No rash noted. He is not diaphoretic.  Psychiatric: He has a normal mood and affect. His behavior is normal.    ED Course  Procedures (  including critical care time)  Date: 04/11/2011  Rate: 99  Rhythm: atrial fibrillation  QRS Axis: normal  Intervals: normal  ST/T Wave abnormalities: nonspecific T wave changes  Conduction Disutrbances:none  Narrative Interpretation: atrial fib new  Old EKG Reviewed: changes noted   Labs Reviewed  CBC - Abnormal; Notable for the following:    RBC 3.49 (*)    Hemoglobin 10.0 (*)    HCT 29.5 (*)    All other components within normal limits  BASIC METABOLIC PANEL - Abnormal; Notable for the following:    Chloride 94 (*)    Glucose, Bld 347 (*)    BUN 32 (*)    Creatinine, Ser 10.21 (*)    GFR calc non Af Amer 5 (*)    GFR calc Af Amer 6 (*)    All other components within normal limits  TROPONIN I  PRO B NATRIURETIC PEPTIDE   Dg Chest 2 View  04/11/2011  *RADIOLOGY REPORT*  Clinical Data: Shortness of  breath.  CHEST - 2 VIEW  Comparison: 12/19/2009  Findings: There is cardiomegaly with vascular congestion. Bibasilar opacities and small effusions.  Slight interstitial prominence.  No acute bony abnormality.  IMPRESSION: Cardiomegaly with bibasilar atelectasis or infiltrates and small effusions.  Slight interstitial prominence could reflect early interstitial edema.  Original Report Authenticated By: Raelyn Number, M.D.     No diagnosis found.    MDM  Pt presents w/ 2 days of SOB that is aggravated by exertion and laying flat and associated w/ cough and wheezing.  H/o ESRD, HTN, diabetes.  No known CAD or pulmonary dz.  Pt reports relief after single breathing tmnt.  No respiratory distress and lungs clear on exam.  Mildly tachycardic.  CXR shows cardiomegaly, small bilateral effusions and possible early interstitial edema.  Dr. Lacinda Axon has examined and pt became dyspneic w/ minimal movement in bed.  He recommends CTA chest.  Pt has received aspirin.  6:03 PM   Labs unremarkable.        Remer Macho, [place] 04/22/11 [phone]

## 2011-04-11 NOTE — ED Provider Notes (Signed)
Medical screening examination/treatment/procedure(s) were conducted as a shared visit with non-physician practitioner(s) and myself.  I personally evaluated the patient during the encounter.  Patient has end-stage renal disease and is short of breath. Suspect fluid overload.  Chest x-ray confirms mild pulmonary edema. Transferred to [place], MD 04/11/11 [phone]

## 2011-04-11 NOTE — ED Provider Notes (Signed)
History     CSN: [id number] Arrival date & time: 04/11/2011  4:28 PM   First MD Initiated Contact with Patient 04/11/11 1636      Chief Complaint  Patient presents with  . Shortness of Breath    (Consider location/radiation/quality/duration/timing/severity/associated sxs/prior treatment) HPI  Past Medical History  Diagnosis Date  . Renal disorder   . Diabetes mellitus   . Hypertension   . Dialysis care   . Renal failure     History reviewed. No pertinent past surgical history.  History reviewed. No pertinent family history.  History  Substance Use Topics  . Smoking status: Not on file  . Smokeless tobacco: Not on file  . Alcohol Use: No      Review of Systems  Allergies  Review of patient's allergies indicates no known allergies.  Home Medications   Current Outpatient Rx  Name Route Sig Dispense Refill  . CALCIUM ACETATE 667 MG PO CAPS Oral Take 2,001 mg by mouth 3 (three) times daily with meals.      Marland Kitchen CARVEDILOL 25 MG PO TABS Oral Take 25 mg by mouth 2 (two) times daily with a meal.      . CLONIDINE HCL 0.3 MG/24HR TD PTWK Transdermal Place 1 patch onto the skin once a week.      . INSULIN ASPART PROT & ASPART (70-30) 100 UNIT/ML [place] SUSP Subcutaneous Inject 25-35 Units into the skin 2 (two) times daily with a meal. Injects 35 units every morning and injects 25 units every evening     . LOSARTAN POTASSIUM 50 MG PO TABS Oral Take 50 mg by mouth daily.      Marland Kitchen RENA-VITE PO TABS Oral Take 1 tablet by mouth daily.        BP 183/103  Pulse 99  Temp(Src) 99.1 F (37.3 C) (Oral)  Resp 20  Ht 5\' 11"  (1.803 m)  Wt 191 lb 12.8 oz (87 kg)  BMI 26.75 kg/m2  SpO2 98%  Physical Exam  ED Course  Procedures (including critical care time)  Labs Reviewed  CBC - Abnormal; Notable for the following:    RBC 3.49 (*)    Hemoglobin 10.0 (*)    HCT 29.5 (*)    All other components within normal limits  BASIC METABOLIC PANEL - Abnormal; Notable for the following:      Chloride 94 (*)    Glucose, Bld 347 (*)    BUN 32 (*)    Creatinine, Ser 10.21 (*)    GFR calc non Af Amer 5 (*)    GFR calc Af Amer 6 (*)    All other components within normal limits  PRO B NATRIURETIC PEPTIDE - Abnormal; Notable for the following:    BNP, POC 24472.0 (*)    All other components within normal limits  TROPONIN I   Dg Chest 2 View  04/11/2011  *RADIOLOGY REPORT*  Clinical Data: Shortness of breath.  CHEST - 2 VIEW  Comparison: 12/19/2009  Findings: There is cardiomegaly with vascular congestion. Bibasilar opacities and small effusions.  Slight interstitial prominence.  No acute bony abnormality.  IMPRESSION: Cardiomegaly with bibasilar atelectasis or infiltrates and small effusions.  Slight interstitial prominence could reflect early interstitial edema.  Original Report Authenticated By: Raelyn Number, M.D.   Ct Angio Chest W/cm &/or Wo Cm  04/11/2011  *RADIOLOGY REPORT*  Clinical Data:  Exertional shortness of breath.  Question pulmonary embolism.  End-stage renal disease on chronic dialysis.  CT ANGIOGRAPHY CHEST WITH CONTRAST  Technique:  Multidetector CT imaging of the chest was performed using the standard protocol during bolus administration of intravenous contrast.  Multiplanar CT image reconstructions including MIPs were obtained to evaluate the vascular anatomy.  Contrast: 170mL OMNIPAQUE IOHEXOL 300 MG/ML IV SOLN  Comparison:  Chest radiographs same date.  Findings:  The pulmonary arteries are well opacified with contrast. There is no evidence of acute pulmonary embolism.  The aorta and great vessels appear normal.  There is a small hiatal hernia.  No enlarged mediastinal or hilar lymph nodes are present.  There are small bilateral pleural effusions.  There is no pericardial effusion.  The lungs demonstrate mild dependent atelectasis bilaterally.  In addition, there are diffuse perihilar ground-glass opacities bilaterally.  The visualized upper abdomen appears  unremarkable.  Mild gynecomastia is noted bilaterally.  Review of the MIP images confirms the above findings.  IMPRESSION:  1.  No evidence of acute pulmonary embolism. 2.  Bilateral perihilar ground-glass opacities and pleural effusions suspicious for pulmonary edema.  Atypical infection is considered less likely.  Original Report Authenticated By: Vivia Ewing, M.D.     Pulmonary Edema ESRD DM   MDM  Spoke with Dr. Arnoldo Morale about this ESRD patient who reports has not missed dialysis who is here with worsening shortness of breath, CT scan shows pulmonary edema and CHF, he likely needs dialysis - we will transfer to [facility], Team 3, Telemetry with the attending being Dr. Sarajane Jews.  In the meantime, will place an inch of paste on this patient, noted to continued to be hyertensive and I will write some holding orders.        Idalia Needle [place], [place] 04/11/11 2055  Idalia Needle [place], [place] 04/11/11 2205

## 2011-04-11 NOTE — ED Notes (Signed)
Per EMS pt has wheezing and sob x 2 days also cbg 494 hx of DM and has taken insulin today, pt is diaysis pt,last preformed on Friday. Albuterol tx enroute.

## 2011-04-11 NOTE — ED Notes (Signed)
Care link called by ed secretary . Calling report to Rn on [phone].

## 2011-04-12 ENCOUNTER — Inpatient Hospital Stay (HOSPITAL_COMMUNITY): Payer: Medicare Other

## 2011-04-12 LAB — CBC
HCT: 26.8 % — ABNORMAL LOW (ref 39.0–52.0)
Hemoglobin: 9 g/dL — ABNORMAL LOW (ref 13.0–17.0)
MCH: 28.7 pg (ref 26.0–34.0)
MCHC: 33.6 g/dL (ref 30.0–36.0)
MCV: 85.4 fL (ref 78.0–100.0)
Platelets: 199 10*3/uL (ref 150–400)
RBC: 3.14 MIL/uL — ABNORMAL LOW (ref 4.22–5.81)
RDW: 13.5 % (ref 11.5–15.5)
WBC: 9.1 10*3/uL (ref 4.0–10.5)

## 2011-04-12 LAB — BASIC METABOLIC PANEL
BUN: 37 mg/dL — ABNORMAL HIGH (ref 6–23)
CO2: 27 mEq/L (ref 19–32)
Calcium: 9.9 mg/dL (ref 8.4–10.5)
Chloride: 96 mEq/L (ref 96–112)
Creatinine, Ser: 10.84 mg/dL — ABNORMAL HIGH (ref 0.50–1.35)
GFR calc Af Amer: 5 mL/min — ABNORMAL LOW (ref >90)
GFR calc non Af Amer: 5 mL/min — ABNORMAL LOW (ref >90)
Glucose, Bld: 203 mg/dL — ABNORMAL HIGH (ref 70–99)
Potassium: 3.2 mEq/L — ABNORMAL LOW (ref 3.5–5.1)
Sodium: 137 mEq/L (ref 135–145)

## 2011-04-12 LAB — GLUCOSE, CAPILLARY
Glucose-Capillary: 199 mg/dL — ABNORMAL HIGH (ref 70–99)
Glucose-Capillary: 199 mg/dL — ABNORMAL HIGH (ref 70–99)
Glucose-Capillary: 358 mg/dL — ABNORMAL HIGH (ref 70–99)
Glucose-Capillary: 112 mg/dL — ABNORMAL HIGH (ref 70–99)

## 2011-04-12 LAB — MRSA PCR SCREENING: MRSA by PCR: NEGATIVE

## 2011-04-12 MED ORDER — INSULIN ASPART PROT & ASPART (70-30 MIX) 100 UNIT/ML ~~LOC~~ SUSP
25.0000 [IU] | Freq: Every day | SUBCUTANEOUS | Status: DC
  Administered 2011-04-14: 25 [IU] via SUBCUTANEOUS
  Filled 2011-04-12: qty 3

## 2011-04-12 MED ORDER — INSULIN ASPART PROT & ASPART (70-30 MIX) 100 UNIT/ML ~~LOC~~ SUSP
35.0000 [IU] | Freq: Every day | SUBCUTANEOUS | Status: DC
  Administered 2011-04-13 (×2): 35 [IU] via SUBCUTANEOUS
  Filled 2011-04-12: qty 3

## 2011-04-12 MED ORDER — ONDANSETRON HCL 4 MG/2ML IJ SOLN
4.0000 mg | Freq: Four times a day (QID) | INTRAMUSCULAR | Status: DC | PRN
Start: 2011-04-12 — End: 2011-04-14

## 2011-04-12 MED ORDER — LOSARTAN POTASSIUM 50 MG PO TABS
50.0000 mg | ORAL_TABLET | Freq: Every day | ORAL | Status: DC
  Administered 2011-04-12: 50 mg via ORAL
  Filled 2011-04-12 (×2): qty 1

## 2011-04-12 MED ORDER — ACETAMINOPHEN 325 MG PO TABS
650.0000 mg | ORAL_TABLET | Freq: Four times a day (QID) | ORAL | Status: DC | PRN
  Administered 2011-04-12: 650 mg via ORAL
  Filled 2011-04-12: qty 2

## 2011-04-12 MED ORDER — SODIUM CHLORIDE 0.9 % IV SOLN: 250.0000 mL | INTRAVENOUS | Status: DC | PRN

## 2011-04-12 MED ORDER — OXYCODONE HCL 5 MG PO TABS
5.0000 mg | ORAL_TABLET | ORAL | Status: DC | PRN
  Administered 2011-04-12 – 2011-04-13 (×2): 5 mg via ORAL
  Filled 2011-04-12 (×2): qty 1

## 2011-04-12 MED ORDER — ONDANSETRON HCL 4 MG PO TABS: 4.0000 mg | ORAL_TABLET | Freq: Four times a day (QID) | ORAL | Status: DC | PRN

## 2011-04-12 MED ORDER — CALCIUM ACETATE 667 MG PO CAPS
2001.0000 mg | ORAL_CAPSULE | Freq: Three times a day (TID) | ORAL | Status: DC
  Administered 2011-04-12 – 2011-04-14 (×6): 2001 mg via ORAL
  Filled 2011-04-12 (×8): qty 3

## 2011-04-12 MED ORDER — INSULIN ASPART PROT & ASPART (70-30 MIX) 100 UNIT/ML ~~LOC~~ SUSP: 25.0000 [IU] | Freq: Two times a day (BID) | SUBCUTANEOUS | Status: DC

## 2011-04-12 MED ORDER — SODIUM CHLORIDE 0.9 % IJ SOLN
3.0000 mL | Freq: Two times a day (BID) | INTRAMUSCULAR | Status: DC
  Administered 2011-04-12: 10 mL via INTRAVENOUS
  Administered 2011-04-12 – 2011-04-13 (×3): 3 mL via INTRAVENOUS

## 2011-04-12 MED ORDER — CARVEDILOL 25 MG PO TABS
25.0000 mg | ORAL_TABLET | Freq: Two times a day (BID) | ORAL | Status: DC
  Administered 2011-04-12 – 2011-04-14 (×5): 25 mg via ORAL
  Filled 2011-04-12 (×7): qty 1

## 2011-04-12 MED ORDER — HYDRALAZINE HCL 20 MG/ML IJ SOLN
10.0000 mg | Freq: Four times a day (QID) | INTRAMUSCULAR | Status: DC | PRN
  Administered 2011-04-13 (×2): 10 mg via INTRAVENOUS
  Filled 2011-04-12: qty 0.5

## 2011-04-12 MED ORDER — SODIUM CHLORIDE 0.9 % IJ SOLN: 3.0000 mL | INTRAMUSCULAR | Status: DC | PRN

## 2011-04-12 MED ORDER — HEPARIN SODIUM (PORCINE) 1000 UNIT/ML DIALYSIS
20.0000 [IU]/kg | INTRAMUSCULAR | Status: DC | PRN
  Administered 2011-04-12: 1700 [IU] via INTRAVENOUS_CENTRAL
  Filled 2011-04-12: qty 2

## 2011-04-12 MED ORDER — ZOLPIDEM TARTRATE 5 MG PO TABS: 5.0000 mg | ORAL_TABLET | Freq: Every evening | ORAL | Status: DC | PRN

## 2011-04-12 MED ORDER — HYDROMORPHONE HCL PF 1 MG/ML IJ SOLN: 0.5000 mg | INTRAMUSCULAR | Status: DC | PRN

## 2011-04-12 MED ORDER — BIOTENE DRY MOUTH MT LIQD
15.0000 mL | Freq: Two times a day (BID) | OROMUCOSAL | Status: DC
  Administered 2011-04-12 – 2011-04-13 (×4): 15 mL via OROMUCOSAL

## 2011-04-12 MED ORDER — ACETAMINOPHEN 650 MG RE SUPP: 650.0000 mg | Freq: Four times a day (QID) | RECTAL | Status: DC | PRN

## 2011-04-12 MED ORDER — ENOXAPARIN SODIUM 30 MG/0.3ML ~~LOC~~ SOLN: 30.0000 mg | SUBCUTANEOUS | Status: DC

## 2011-04-12 MED ORDER — CLONIDINE HCL 0.3 MG/24HR TD PTWK
0.3000 mg | MEDICATED_PATCH | TRANSDERMAL | Status: DC
  Administered 2011-04-12: 0.3 mg via TRANSDERMAL
  Filled 2011-04-12: qty 1

## 2011-04-12 NOTE — Progress Notes (Signed)
Pt is off unit in hemo

## 2011-04-12 NOTE — Progress Notes (Signed)
PROGRESS NOTE  [name] [id number] DOB: March 13, 1956 DOA: 04/11/2011 PCP: Melburn Hake, MD, MD  Brief narrative: 55 year old man who presented to [facility] with shortness of breath. He denied chest pain. He was noted to be markedly hypertensive and volume overloaded. He was transferred to [facility] for dialysis.  Past medical history: End-stage renal disease, diabetes mellitus, hypertension.  Consultants:  Nephrology  Procedures:  Routine hemodialysis  Interim History: Interim documentation reviewed. Subjective: Feels perhaps a bit better. Still short of breath.  Objective: Filed Vitals:   04/12/11 1807  BP: 190/122  Pulse: 94  Temp: 98.4 F (36.9 C)  Resp: 18   Blood pressure 190/122, pulse 94, temperature 98.4 F (36.9 C), temperature source Oral, resp. rate 18, height 5\' 11"  (1.803 m), weight 84.5 kg (186 lb 4.6 oz), SpO2 93.00%. Filed Vitals:   04/12/11 1705 04/12/11 1725 04/12/11 1742 04/12/11 1807  BP: 180/109 186/109 190/104 190/122  Pulse:  90 92 94  Temp:   98.4 F (36.9 C) 98.4 F (36.9 C)  TempSrc:    Oral  Resp: 13 7  18   Height:      Weight:   84.5 kg (186 lb 4.6 oz)   SpO2:   94% 93%     Intake/Output Summary (Last 24 hours) at 04/12/11 1835 Last data filed at 04/12/11 1742  Gross per 24 hour  Intake    240 ml  Output   4219 ml  Net  -3979 ml    Exam: General: Lying in bed. Nontoxic. Cardiovascular: Regular rate and rhythm. No murmur, rub or gallop.  Respiratory: Clear to auscultation bilaterally. No wheezes, rales or rhonchi. Mild increased work of breathing.  Data Reviewed: Basic Metabolic Panel:  Lab [id number] 0600 04/11/11 1750  NA 137 136  K 3.2* 3.5  CL 96 94*  CO2 27 29  GLUCOSE 203* 347*  BUN 37* 32*  CREATININE 10.84* 10.21*  CALCIUM 9.9 10.3  MG -- --  PHOS -- --   CBC:  Lab 04/12/11 0600 04/11/11 1750  WBC 9.1 10.0  NEUTROABS -- --  HGB 9.0* 10.0*  HCT 26.8* 29.5*  MCV 85.4 84.5  PLT  199 219   Cardiac Enzymes:  Lab 04/11/11 1750  CKTOTAL --  CKMB --  CKMBINDEX --  TROPONINI <0.30   BNP:  Lab 04/11/11 1750  POCBNP 24472.0*   CBG:  Lab 04/12/11 1804 04/12/11 1133 04/12/11 0731 04/12/11 0603 04/11/11 2242  GLUCAP 112* 358* 199* 199* 269*    Recent Results (from the past 240 hour(s))  MRSA PCR SCREENING     Status: Normal   Collection Time   04/12/11  6:30 AM      Component Value Range Status Comment   MRSA by PCR NEGATIVE  NEGATIVE  Final      Studies: Dg Chest 2 View  04/11/2011  *RADIOLOGY REPORT*  Clinical Data: Shortness of breath.  CHEST - 2 VIEW  Comparison: 12/19/2009  Findings: There is cardiomegaly with vascular congestion. Bibasilar opacities and small effusions.  Slight interstitial prominence.  No acute bony abnormality.  IMPRESSION: Cardiomegaly with bibasilar atelectasis or infiltrates and small effusions.  Slight interstitial prominence could reflect early interstitial edema.  Original Report Authenticated By: Raelyn Number, M.D.   Ct Angio Chest W/cm &/or Wo Cm  04/11/2011  *RADIOLOGY REPORT*  Clinical Data:  Exertional shortness of breath.  Question pulmonary embolism.  End-stage renal disease on chronic dialysis.  CT ANGIOGRAPHY CHEST WITH CONTRAST  Technique:  Multidetector CT imaging of the chest was performed using the standard protocol during bolus administration of intravenous contrast.  Multiplanar CT image reconstructions including MIPs were obtained to evaluate the vascular anatomy.  Contrast: 157mL OMNIPAQUE IOHEXOL 300 MG/ML IV SOLN  Comparison:  Chest radiographs same date.  Findings:  The pulmonary arteries are well opacified with contrast. There is no evidence of acute pulmonary embolism.  The aorta and great vessels appear normal.  There is a small hiatal hernia.  No enlarged mediastinal or hilar lymph nodes are present.  There are small bilateral pleural effusions.  There is no pericardial effusion.  The lungs demonstrate mild  dependent atelectasis bilaterally.  In addition, there are diffuse perihilar ground-glass opacities bilaterally.  The visualized upper abdomen appears unremarkable.  Mild gynecomastia is noted bilaterally.  Review of the MIP images confirms the above findings.  IMPRESSION:  1.  No evidence of acute pulmonary embolism. 2.  Bilateral perihilar ground-glass opacities and pleural effusions suspicious for pulmonary edema.  Atypical infection is considered less likely.  Original Report Authenticated By: Vivia Ewing, M.D.    Scheduled Meds:   . antiseptic oral rinse  15 mL Mouth Rinse BID  . aspirin  325 mg Oral Daily  . carvedilol  25 mg Oral BID WC  . cloNIDine  0.3 mg Transdermal Weekly  . furosemide  80 mg Intravenous Once  . insulin aspart  0-5 Units Subcutaneous QHS  . insulin aspart  0-9 Units Subcutaneous TID WC  . insulin aspart protamine-insulin aspart  25 Units Subcutaneous Q supper  . insulin aspart protamine-insulin aspart  35 Units Subcutaneous Q breakfast  . losartan  50 mg Oral Daily  . nitroGLYCERIN  1 inch Topical Once  . nitroGLYCERIN  1 inch Topical Q6H  . sodium chloride  3 mL Intravenous Q12H  . DISCONTD: enoxaparin  30 mg Subcutaneous Q24H  . DISCONTD: enoxaparin  30 mg Subcutaneous Q24H  . DISCONTD: insulin aspart protamine-insulin aspart  25-35 Units Subcutaneous BID WC   Continuous Infusions:    Assessment/Plan: 1. Shortness of breath/volume overload: Hemodialysis per nephrology. 2. Uncontrolled hypertension: Secondary to #1. Resume chronic antihypertensives. 3. Diabetes mellitus: Continue insulin 70/30 insulin scale insulin.   Murray Hodgkins, MD  [facility] Pager [phone] 04/12/2011, 6:35 PM    LOS: 1 day

## 2011-04-12 NOTE — Progress Notes (Cosign Needed)
HPI 55 y/o AAM transferred from [place] with ESRD and CHF.  CC presented  With dyspnea PMH Renal disorder   Diabetes mellitus   Hypertension   Dialysis care   Renal failure   Exam A&O no acute distress, denies SOB, chest pain ,  HEENT unremarkable  Cardio RRR no murmurs present no edema present on this exam  Lungs CTA  Shunt present in left forearm positive for thrill/feel  Abdomen slightly distended BS present   Impression : SOB (shortness of breath) .Diastolic CHF, acute on chronic .CKD (chronic kidney disease) stage V requiring chronic dialysis .DM (diabetes mellitus), type 2, uncontrolled .HTN (hypertension)   Plan  Dialysis Team continue dialysis as scheduled  Medication review no changes at this time  HTN prn clonidine for elevated SBP/DBP

## 2011-04-12 NOTE — Consult Note (Signed)
[name] is an 55 y.o. male.  HPI: Pt. In ER with congestive heart failure  Past Medical History  Diagnosis Date  . Renal disorder   . Diabetes mellitus   . Hypertension   . Dialysis care   . Renal failure   congestive heart failure  History reviewed. No pertinent past surgical history.  History reviewed. No pertinent family history.  Social History:  does not have a smoking history on file. He does not have any smokeless tobacco history on file. He reports that he does not drink alcohol or use illicit drugs.  Allergies: No Known Allergies  Medications: I have reviewed the patient's current medications.  Results for orders placed during the hospital encounter of 04/11/11 (from the past 48 hour(s))  TROPONIN I     Status: Normal   Collection Time   04/11/11  5:50 PM      Component Value Range Comment   Troponin I <0.30  <0.30 (ng/mL)   CBC     Status: Abnormal   Collection Time   04/11/11  5:50 PM      Component Value Range Comment   WBC 10.0  4.0 - 10.5 (K/uL)    RBC 3.49 (*) 4.22 - 5.81 (MIL/uL)    Hemoglobin 10.0 (*) 13.0 - 17.0 (g/dL)    HCT 29.5 (*) 39.0 - 52.0 (%)    MCV 84.5  78.0 - 100.0 (fL)    MCH 28.7  26.0 - 34.0 (pg)    MCHC 33.9  30.0 - 36.0 (g/dL)    RDW 13.1  11.5 - 15.5 (%)    Platelets 219  150 - 400 (K/uL)   BASIC METABOLIC PANEL     Status: Abnormal   Collection Time   04/11/11  5:50 PM      Component Value Range Comment   Sodium 136  135 - 145 (mEq/L)    Potassium 3.5  3.5 - 5.1 (mEq/L)    Chloride 94 (*) 96 - 112 (mEq/L)    CO2 29  19 - 32 (mEq/L)    Glucose, Bld 347 (*) 70 - 99 (mg/dL)    BUN 32 (*) 6 - 23 (mg/dL)    Creatinine, Ser 10.21 (*) 0.50 - 1.35 (mg/dL)    Calcium 10.3  8.4 - 10.5 (mg/dL)    GFR calc non Af Amer 5 (*) >90 (mL/min)    GFR calc Af Amer 6 (*) >90 (mL/min)   PRO B NATRIURETIC PEPTIDE     Status: Abnormal   Collection Time   04/11/11  5:50 PM      Component Value Range Comment   BNP, POC 24472.0 (*) 0 - 125 (pg/mL)    GLUCOSE, CAPILLARY     Status: Abnormal   Collection Time   04/11/11 10:42 PM      Component Value Range Comment   Glucose-Capillary 269 (*) 70 - 99 (mg/dL)    Comment 1 Notify RN      Comment 2 Documented in Chart     BASIC METABOLIC PANEL     Status: Abnormal   Collection Time   04/12/11  6:00 AM      Component Value Range Comment   Sodium 137  135 - 145 (mEq/L)    Potassium 3.2 (*) 3.5 - 5.1 (mEq/L)    Chloride 96  96 - 112 (mEq/L)    CO2 27  19 - 32 (mEq/L)    Glucose, Bld 203 (*) 70 - 99 (mg/dL)    BUN  37 (*) 6 - 23 (mg/dL)    Creatinine, Ser 10.84 (*) 0.50 - 1.35 (mg/dL)    Calcium 9.9  8.4 - 10.5 (mg/dL)    GFR calc non Af Amer 5 (*) >90 (mL/min)    GFR calc Af Amer 5 (*) >90 (mL/min)   CBC     Status: Abnormal   Collection Time   04/12/11  6:00 AM      Component Value Range Comment   WBC 9.1  4.0 - 10.5 (K/uL)    RBC 3.14 (*) 4.22 - 5.81 (MIL/uL)    Hemoglobin 9.0 (*) 13.0 - 17.0 (g/dL)    HCT 26.8 (*) 39.0 - 52.0 (%)    MCV 85.4  78.0 - 100.0 (fL)    MCH 28.7  26.0 - 34.0 (pg)    MCHC 33.6  30.0 - 36.0 (g/dL)    RDW 13.5  11.5 - 15.5 (%)    Platelets 199  150 - 400 (K/uL)   MRSA PCR SCREENING     Status: Normal   Collection Time   04/12/11  6:30 AM      Component Value Range Comment   MRSA by PCR NEGATIVE  NEGATIVE    GLUCOSE, CAPILLARY     Status: Abnormal   Collection Time   04/12/11 11:33 AM      Component Value Range Comment   Glucose-Capillary 358 (*) 70 - 99 (mg/dL)     Dg Chest 2 View  04/11/2011  *RADIOLOGY REPORT*  Clinical Data: Shortness of breath.  CHEST - 2 VIEW  Comparison: 12/19/2009  Findings: There is cardiomegaly with vascular congestion. Bibasilar opacities and small effusions.  Slight interstitial prominence.  No acute bony abnormality.  IMPRESSION: Cardiomegaly with bibasilar atelectasis or infiltrates and small effusions.  Slight interstitial prominence could reflect early interstitial edema.  Original Report Authenticated By: Raelyn Number, M.D.   Ct Angio Chest W/cm &/or Wo Cm  04/11/2011  *RADIOLOGY REPORT*  Clinical Data:  Exertional shortness of breath.  Question pulmonary embolism.  End-stage renal disease on chronic dialysis.  CT ANGIOGRAPHY CHEST WITH CONTRAST  Technique:  Multidetector CT imaging of the chest was performed using the standard protocol during bolus administration of intravenous contrast.  Multiplanar CT image reconstructions including MIPs were obtained to evaluate the vascular anatomy.  Contrast: 130mL OMNIPAQUE IOHEXOL 300 MG/ML IV SOLN  Comparison:  Chest radiographs same date.  Findings:  The pulmonary arteries are well opacified with contrast. There is no evidence of acute pulmonary embolism.  The aorta and great vessels appear normal.  There is a small hiatal hernia.  No enlarged mediastinal or hilar lymph nodes are present.  There are small bilateral pleural effusions.  There is no pericardial effusion.  The lungs demonstrate mild dependent atelectasis bilaterally.  In addition, there are diffuse perihilar ground-glass opacities bilaterally.  The visualized upper abdomen appears unremarkable.  Mild gynecomastia is noted bilaterally.  Review of the MIP images confirms the above findings.  IMPRESSION:  1.  No evidence of acute pulmonary embolism. 2.  Bilateral perihilar ground-glass opacities and pleural effusions suspicious for pulmonary edema.  Atypical infection is considered less likely.  Original Report Authenticated By: Vivia Ewing, M.D.    Blood pressure 229/127, pulse 124, temperature 98.1 F (36.7 C), temperature source Oral, resp. rate 20, height 5\' 11"  (1.803 m), weight 87 kg (191 lb 12.8 oz), SpO2 98.00%.  Physical Exam:  General appearance: alert, cooperative and appears stated age Head: Normocephalic, without obvious abnormality,  atraumatic Eyes: conjunctivae/corneas clear. PERRL, EOM's intact. Fundi benign. Ears: normal TM's and external ear canals both ears Nose: Nares normal.  Septum midline. Mucosa normal. No drainage or sinus tenderness. Throat: lips, mucosa, and tongue normal; teeth and gums normal Resp: clear to auscultation bilaterally Chest wall: no tenderness Cardio: regular rate and rhythm, S1, S2 normal, no murmur, click, rub or gallop GI: soft, non-tender; bowel sounds normal; no masses,  no organomegaly Extremities: extremities normal, atraumatic, no cyanosis or edema Skin: Skin color, texture, turgor normal. No rashes or lesions Neurologic: Grossly normal  Assessment/Plan: 1. ESRD 2. HTN Dialysis today Grant Fontana, MD 04/12/2011, 12:00 PM

## 2011-04-13 DIAGNOSIS — J811 Chronic pulmonary edema: Secondary | ICD-10-CM | POA: Diagnosis present

## 2011-04-13 LAB — BASIC METABOLIC PANEL
BUN: 26 mg/dL — ABNORMAL HIGH (ref 6–23)
CO2: 28 mEq/L (ref 19–32)
Calcium: 9.2 mg/dL (ref 8.4–10.5)
Chloride: 96 mEq/L (ref 96–112)
Creatinine, Ser: 7.15 mg/dL — ABNORMAL HIGH (ref 0.50–1.35)
GFR calc Af Amer: 9 mL/min — ABNORMAL LOW (ref >90)
GFR calc non Af Amer: 8 mL/min — ABNORMAL LOW (ref >90)
Glucose, Bld: 208 mg/dL — ABNORMAL HIGH (ref 70–99)
Potassium: 3.5 mEq/L (ref 3.5–5.1)
Sodium: 136 mEq/L (ref 135–145)

## 2011-04-13 LAB — GLUCOSE, CAPILLARY
Glucose-Capillary: 322 mg/dL — ABNORMAL HIGH (ref 70–99)
Glucose-Capillary: 187 mg/dL — ABNORMAL HIGH (ref 70–99)
Glucose-Capillary: 247 mg/dL — ABNORMAL HIGH (ref 70–99)
Glucose-Capillary: 289 mg/dL — ABNORMAL HIGH (ref 70–99)
Glucose-Capillary: 72 mg/dL (ref 70–99)

## 2011-04-13 MED ORDER — HEPARIN SODIUM (PORCINE) 1000 UNIT/ML DIALYSIS
20.0000 [IU]/kg | INTRAMUSCULAR | Status: DC | PRN
  Filled 2011-04-13: qty 2

## 2011-04-13 MED ORDER — DILTIAZEM HCL 30 MG PO TABS
30.0000 mg | ORAL_TABLET | Freq: Four times a day (QID) | ORAL | Status: DC
  Administered 2011-04-13 – 2011-04-14 (×4): 30 mg via ORAL
  Filled 2011-04-13 (×9): qty 1

## 2011-04-13 MED ORDER — LOSARTAN POTASSIUM 50 MG PO TABS
50.0000 mg | ORAL_TABLET | Freq: Two times a day (BID) | ORAL | Status: DC
  Administered 2011-04-13 (×2): 50 mg via ORAL
  Filled 2011-04-13 (×4): qty 1

## 2011-04-13 NOTE — Progress Notes (Signed)
Clinical Social Worker completed psychosocial assessment and placed in shadow chart. At the request of pt, Clinical Social Worker faxed and contacted the [employer] of [place] to provide documentation that of pt admission to hospital and inability to appear in court on April 12, 2011. Clinical Social Worker provided copy of document to pt. No further social work needs at this time. Clinical Social Worker signing off at this time. Please re-consult if further needs arise.  Drake Leach, MSW, [place] [facility] [phone]

## 2011-04-13 NOTE — Progress Notes (Signed)
Inpatient Diabetes Program Recommendations  AACE/ADA: New Consensus Statement on Inpatient Glycemic Control (2009)  Target Ranges:  Prepandial:   less than 140 mg/dL      Peak postprandial:   less than 180 mg/dL (1-2 hours)      Critically ill patients:  140 - 180 mg/dL   Reason for Visit: Hyperglycemia following meals.  Inpatient Diabetes Program Recommendations Correction (SSI): Please increase correction to moderate tidwc plus HS scale  Note: Please add carbohydrate modified to diet orders.

## 2011-04-13 NOTE — Progress Notes (Signed)
PCP: Melburn Hake, MD, MD  Brief narrative:  55 year old man who presented to [facility] long hospital with shortness of breath. He denied chest pain. He was noted to be markedly hypertensive and volume overloaded. He was transferred to [facility] for dialysis. Dialysed yesterday and feels better.  Past medical history:  End-stage renal disease, diabetes mellitus, hypertension.   Consultants:  Nephrology  Procedures:  Routine hemodialysis   Subjective: Feels better. No chest pain at rest but is present with deep breathing. Shortness is better. Is concerned about high BP. BP at home runs around [id number] systolic and [id number] diastolic.  Objective: Vital signs in last 24 hours: Temp:  [98.1 F (36.7 C)-98.4 F (36.9 C)] 98.4 F (36.9 C) (12/04 0447) Pulse Rate:  [83-124] 95  (12/04 0447) Resp:  [7-24] 20  (12/04 0447) BP: (180-229)/(86-127) 202/119 mmHg (12/04 0447) SpO2:  [92 %-100 %] 92 % (12/04 0447) Weight:  [84.5 kg (186 lb 4.6 oz)-88.4 kg (194 lb 14.2 oz)] 186 lb 4.6 oz (84.5 kg) (12/03 1742) Weight change: 1.4 kg (3 lb 1.4 oz)    Intake/Output from previous day: 12/03 0701 - 12/04 0700 In: 240 [P.O.:240] Out: 4419 [Urine:200] Intake/Output this shift:    General appearance: alert, cooperative, appears stated age and no distress Head: Normocephalic, without obvious abnormality, atraumatic Eyes: conjunctivae/corneas clear. PERRL, EOM's intact. Fundi benign. Throat: lips, mucosa, and tongue normal; teeth and gums normal Neck: no adenopathy, no carotid bruit, no JVD, supple, symmetrical, trachea midline and thyroid not enlarged, symmetric, no tenderness/mass/nodules Resp: clear to auscultation bilaterally Cardio: Tachycardic, regular, No s3 s4. No runs murmurs, or bruits. GI: soft, non-tender; bowel sounds normal; no masses,  no organomegaly Extremities: extremities normal, atraumatic, no cyanosis or edema Skin: Skin color, texture, turgor normal. No rashes or lesions Lymph  nodes: Cervical, supraclavicular, and axillary nodes normal. Neurologic: Alert and oriented X 3, normal strength and tone. Normal symmetric reflexes. Normal coordination and gait  Lab Results:  Basename 04/12/11 0600 04/11/11 1750  WBC 9.1 10.0  HGB 9.0* 10.0*  HCT 26.8* 29.5*  PLT 199 219   BMET  Basename 04/13/11 0540 04/12/11 0600  NA 136 137  K 3.5 3.2*  CL 96 96  CO2 28 27  GLUCOSE 208* 203*  BUN 26* 37*  CREATININE 7.15* 10.84*  CALCIUM 9.2 9.9    Studies/Results: Dg Chest 2 View  04/11/2011  *RADIOLOGY REPORT*  Clinical Data: Shortness of breath.  CHEST - 2 VIEW  Comparison: 12/19/2009  Findings: There is cardiomegaly with vascular congestion. Bibasilar opacities and small effusions.  Slight interstitial prominence.  No acute bony abnormality.  IMPRESSION: Cardiomegaly with bibasilar atelectasis or infiltrates and small effusions.  Slight interstitial prominence could reflect early interstitial edema.  Original Report Authenticated By: Raelyn Number, M.D.   Ct Angio Chest W/cm &/or Wo Cm  04/11/2011  *RADIOLOGY REPORT*  Clinical Data:  Exertional shortness of breath.  Question pulmonary embolism.  End-stage renal disease on chronic dialysis.  CT ANGIOGRAPHY CHEST WITH CONTRAST  Technique:  Multidetector CT imaging of the chest was performed using the standard protocol during bolus administration of intravenous contrast.  Multiplanar CT image reconstructions including MIPs were obtained to evaluate the vascular anatomy.  Contrast: 118mL OMNIPAQUE IOHEXOL 300 MG/ML IV SOLN  Comparison:  Chest radiographs same date.  Findings:  The pulmonary arteries are well opacified with contrast. There is no evidence of acute pulmonary embolism.  The aorta and great vessels appear normal.  There is a small  hiatal hernia.  No enlarged mediastinal or hilar lymph nodes are present.  There are small bilateral pleural effusions.  There is no pericardial effusion.  The lungs demonstrate mild dependent  atelectasis bilaterally.  In addition, there are diffuse perihilar ground-glass opacities bilaterally.  The visualized upper abdomen appears unremarkable.  Mild gynecomastia is noted bilaterally.  Review of the MIP images confirms the above findings.  IMPRESSION:  1.  No evidence of acute pulmonary embolism. 2.  Bilateral perihilar ground-glass opacities and pleural effusions suspicious for pulmonary edema.  Atypical infection is considered less likely.  Original Report Authenticated By: Vivia Ewing, M.D.    Medications:  Scheduled:   . antiseptic oral rinse  15 mL Mouth Rinse BID  . aspirin  325 mg Oral Daily  . calcium acetate  2,001 mg Oral TID WC  . carvedilol  25 mg Oral BID WC  . cloNIDine  0.3 mg Transdermal Weekly  . insulin aspart  0-5 Units Subcutaneous QHS  . insulin aspart  0-9 Units Subcutaneous TID WC  . insulin aspart protamine-insulin aspart  25 Units Subcutaneous Q supper  . insulin aspart protamine-insulin aspart  35 Units Subcutaneous Q breakfast  . losartan  50 mg Oral Daily  . sodium chloride  3 mL Intravenous Q12H  . DISCONTD: nitroGLYCERIN  1 inch Topical Q6H    Principal Problem:  *Diastolic CHF, acute on chronic Active Problems:  SOB (shortness of breath)  CKD (chronic kidney disease) stage V requiring chronic dialysis  DM (diabetes mellitus), type 2, uncontrolled  HTN (hypertension)   Assessment/Plan: 1. Pulmonary edema/Fluid overload: Improved with hemodialysis. 2. Uncontrolled Hypertension: Increase dose of cozaar. Add Cardizem. High BP may have contributed to fluid overload. 3. ESRD on HD: MWF. Dialysed yesterday. 4. DM on Insulin: Monitor CBG's.Adjust insulin as needed. 5. CP: Likely due to #1. No PE on CTA. 6. DVT prophylaxis: SCD's  Full Code.   LOS: 2 days   Jonisha Kindig 04/13/2011, 9:18 AM

## 2011-04-13 NOTE — Progress Notes (Signed)
04/13/2011 [place], [place] Case Management Note [phone]      Utilization review completed.

## 2011-04-13 NOTE — Clinical Documentation Improvement (Signed)
Hypertension Documentation Clarification Query  THIS DOCUMENT IS NOT A PERMANENT PART OF THE MEDICAL RECORD  TO RESPOND TO THE THIS QUERY, FOLLOW THE INSTRUCTIONS BELOW:  1. If needed, update documentation for the patient's encounter via the notes activity.  2. Access this query again and click edit on the [employer].  3. After updating, or not, click F2 to complete all highlighted (required) fields concerning your review. Select "additional documentation in the medical record" OR "no additional documentation provided".  4. Click Sign note button.  5. The deficiency will fall out of your InBasket *Please let us know if you are not able to compete this workflow by phone or e-mail (listed below).        04/13/11  Dear Dr. Curly Rim Rolley Sims,  In an effort to better capture your patient's severity of illness, reflect appropriate length of stay and utilization of resources, a review of the patient medical record has revealed the following indicators.    Based on your clinical judgment, please clarify and document in a progress note and/or discharge summary the clinical condition associated with the following supporting information:  In responding to this query please exercise your independent judgment.  The fact that a query is asked, does not imply that any particular answer is desired or expected.  Possible Clinical Conditions?   " Accelerated Hypertension  " Malignant Hypertension  " Or Other Condition __________________________  " Cannot Clinically Determine   Supporting Information:  Risk Factors:(As per notes) pt has "Acute on Chronic diastolic CHF" & "pulmonary edema"   Signs and Symptoms: (As per notes)  Shortness of breath/volume overload: Hemodialysis per nephrology.  Uncontrolled hypertension: Secondary to #1. Resume chronic antihypertensives. Filed Vitals:     04/11/11 1835  04/11/11 1837  04/11/11 2001  04/11/11 2148   BP:  199/102  187/110  183/103   185/108      Diagnostics: 04/11/11  5:50 PM   BNP, POC  24472.0 (*)   Treatment: Apresoline,Cozaar, Coreg, Catapres  You may use possible, probable, or suspect with inpatient documentation. Possible, probable, suspected diagnoses MUST be documented at the time of discharge.  Reviewed: additional documentation in the medical record  Thank You,  Wallie Char Delk RN, BSN Clinical Documentation Specialist: [phone] Pager  [place] Documentation Specialist:

## 2011-04-13 NOTE — Consult Note (Signed)
Pt feels better. Breathing better.However, still with some chest discomfort. Will dialyze tomorrow. He may be discharged. Blood pressure meds need to be looked at.,however. See orders.

## 2011-04-14 ENCOUNTER — Inpatient Hospital Stay (HOSPITAL_COMMUNITY): Payer: Medicare Other

## 2011-04-14 DIAGNOSIS — I10 Essential (primary) hypertension: Secondary | ICD-10-CM | POA: Diagnosis present

## 2011-04-14 LAB — DIFFERENTIAL
Basophils Absolute: 0 10*3/uL (ref 0.0–0.1)
Basophils Relative: 0 % (ref 0–1)
Eosinophils Absolute: 0.2 10*3/uL (ref 0.0–0.7)
Eosinophils Relative: 3 % (ref 0–5)
Lymphocytes Relative: 26 % (ref 12–46)
Lymphs Abs: 1.9 10*3/uL (ref 0.7–4.0)
Monocytes Absolute: 0.7 10*3/uL (ref 0.1–1.0)
Monocytes Relative: 9 % (ref 3–12)
Neutro Abs: 4.4 10*3/uL (ref 1.7–7.7)
Neutrophils Relative %: 61 % (ref 43–77)

## 2011-04-14 LAB — CBC
HCT: 29.1 % — ABNORMAL LOW (ref 39.0–52.0)
Hemoglobin: 9.8 g/dL — ABNORMAL LOW (ref 13.0–17.0)
MCH: 28.5 pg (ref 26.0–34.0)
MCHC: 33.7 g/dL (ref 30.0–36.0)
MCV: 84.6 fL (ref 78.0–100.0)
Platelets: 234 10*3/uL (ref 150–400)
RBC: 3.44 MIL/uL — ABNORMAL LOW (ref 4.22–5.81)
RDW: 13.2 % (ref 11.5–15.5)
WBC: 7.2 10*3/uL (ref 4.0–10.5)

## 2011-04-14 LAB — BASIC METABOLIC PANEL
BUN: 42 mg/dL — ABNORMAL HIGH (ref 6–23)
CO2: 27 mEq/L (ref 19–32)
Calcium: 10.1 mg/dL (ref 8.4–10.5)
Chloride: 96 mEq/L (ref 96–112)
Creatinine, Ser: 9.35 mg/dL — ABNORMAL HIGH (ref 0.50–1.35)
GFR calc Af Amer: 6 mL/min — ABNORMAL LOW (ref >90)
GFR calc non Af Amer: 6 mL/min — ABNORMAL LOW (ref >90)
Glucose, Bld: 91 mg/dL (ref 70–99)
Potassium: 3.4 mEq/L — ABNORMAL LOW (ref 3.5–5.1)
Sodium: 136 mEq/L (ref 135–145)

## 2011-04-14 LAB — PHOSPHORUS: Phosphorus: 3.2 mg/dL (ref 2.3–4.6)

## 2011-04-14 LAB — GLUCOSE, CAPILLARY
Glucose-Capillary: 93 mg/dL (ref 70–99)
Glucose-Capillary: 306 mg/dL — ABNORMAL HIGH (ref 70–99)

## 2011-04-14 LAB — ALBUMIN: Albumin: 2.9 g/dL — ABNORMAL LOW (ref 3.5–5.2)

## 2011-04-14 LAB — HEPATITIS B SURFACE ANTIGEN: Hepatitis B Surface Ag: NEGATIVE

## 2011-04-14 MED ORDER — DILTIAZEM HCL 60 MG PO TABS: 60.0000 mg | ORAL_TABLET | Freq: Three times a day (TID) | ORAL | Status: DC

## 2011-04-14 MED ORDER — LOSARTAN POTASSIUM 50 MG PO TABS: 50.0000 mg | ORAL_TABLET | Freq: Two times a day (BID) | ORAL | Status: DC

## 2011-04-14 MED ORDER — DILTIAZEM HCL 60 MG PO TABS
60.0000 mg | ORAL_TABLET | Freq: Three times a day (TID) | ORAL | Status: DC
  Filled 2011-04-14 (×2): qty 1

## 2011-04-14 NOTE — Progress Notes (Signed)
04/14/2011 [facility], [place] Case Management Note [id number]         CARE MANAGEMENT NOTE 04/14/2011  Patient:  [name], [name]   Account Number:  [id number]  Date Initiated:  04/14/2011  Documentation initiated by:  Kellie Moor  Subjective/Objective Assessment:   admitted on 04/11/11 with c/o shortness of breath     Action/Plan:   Prior to admission, patient lived at home. Independent with ADLs   Anticipated DC Date:  04/17/2011   Anticipated DC Plan:  [place]  CM consult      Choice offered to / List presented to:             Status of service:  In process, will continue to follow Medicare Important Message given?   (If response is "NO", the following Medicare IM given date fields will be blank) Date Medicare IM given:   Date Additional Medicare IM given:    Discharge Disposition:    Per UR Regulation:  Reviewed for med. necessity/level of care/duration of stay  Comments:  04/14/11-1106-J.Bertram Savin  [id number]      55yo male patient admitted on 04/11/11 with c/o shortness of breath. History is significant for DM, HTN, ESRD, and HD on m-w-f. Prior to admission, patient lived at home and was independent with ADLs. Denies any use of DME or need for home health services. No discharge needs noted at present. CM will continue to monitor.

## 2011-04-14 NOTE — Progress Notes (Addendum)
PCP: Melburn Hake, MD, MD  Brief narrative:  55 year old man who presented to [facility] long hospital with shortness of breath. He denied chest pain. He was noted to be markedly hypertensive and volume overloaded. He was transferred to [facility] for dialysis.   Past medical history:  End-stage renal disease, diabetes mellitus, hypertension.   Consultants:  Nephrology  Procedures:  Routine hemodialysis   Subjective: Feels better. No chest pain. Shortness is better. Is concerned about high BP. BP at home runs around 123XX123 systolic and [id number] diastolic. No new complaints offered.  Objective: Vital signs in last 24 hours: Temp:  [97.7 F (36.5 C)-98.4 F (36.9 C)] 97.9 F (36.6 C) (12/05 1313) Pulse Rate:  [72-133] 91  (12/05 1313) Resp:  [12-20] 20  (12/05 1313) BP: (159-193)/(78-110) 191/96 mmHg (12/05 1313) SpO2:  [94 %-99 %] 98 % (12/05 1313) Weight:  [84 kg (185 lb 3 oz)-87.4 kg (192 lb 10.9 oz)] 185 lb 3 oz (84 kg) (12/05 1227) Weight change: -1.7 kg (-3 lb 12 oz) Last BM Date: 04/10/11  Intake/Output from previous day: 12/04 0701 - 12/05 0700 In: 480 [P.O.:480] Out: -  Intake/Output this shift: Total I/O In: 240 [P.O.:240] Out: 3000 [Other:3000]  General appearance: alert, cooperative, appears stated age and no distress Head: Normocephalic, without obvious abnormality, atraumatic Eyes: conjunctivae/corneas clear. PERRL, EOM's intact. Fundi benign. Throat: lips, mucosa, and tongue normal; teeth and gums normal Neck: no adenopathy, no carotid bruit, no JVD, supple, symmetrical, trachea midline and thyroid not enlarged, symmetric, no tenderness/mass/nodules Resp: clear to auscultation bilaterally Cardio: Regular, No s3 s4. No rubs murmurs, or bruits. GI: soft, non-tender; bowel sounds normal; no masses,  no organomegaly Extremities: extremities normal, atraumatic, no cyanosis or edema Skin: Skin color, texture, turgor normal. No rashes or lesions Neurologic: Alert  and oriented X 3, normal strength and tone. No focal deficits.  Lab Results:  Basename 04/14/11 0916 04/12/11 0600  WBC 7.2 9.1  HGB 9.8* 9.0*  HCT 29.1* 26.8*  PLT 234 199   BMET  Basename 04/14/11 0515 04/13/11 0540  NA 136 136  K 3.4* 3.5  CL 96 96  CO2 27 28  GLUCOSE 91 208*  BUN 42* 26*  CREATININE 9.35* 7.15*  CALCIUM 10.1 9.2    Studies/Results: No results found.  Medications:  Scheduled:    . antiseptic oral rinse  15 mL Mouth Rinse BID  . aspirin  325 mg Oral Daily  . calcium acetate  2,001 mg Oral TID WC  . carvedilol  25 mg Oral BID WC  . cloNIDine  0.3 mg Transdermal Weekly  . diltiazem  60 mg Oral Q8H  . insulin aspart  0-5 Units Subcutaneous QHS  . insulin aspart  0-9 Units Subcutaneous TID WC  . insulin aspart protamine-insulin aspart  25 Units Subcutaneous Q supper  . insulin aspart protamine-insulin aspart  35 Units Subcutaneous Q breakfast  . losartan  50 mg Oral BID  . sodium chloride  3 mL Intravenous Q12H  . DISCONTD: diltiazem  30 mg Oral Q6H    Principal Problem:  *Pulmonary edema Active Problems:  CKD (chronic kidney disease) stage V requiring chronic dialysis  DM (diabetes mellitus), type 2, uncontrolled  HTN (hypertension)   Assessment/Plan: 1. Pulmonary edema/Fluid overload: Improved with hemodialysis. 2. Accelerated Hypertension: High BP may contributed to fluid overload. Did not get his medications today due to dialysis and hence BP remains high. Increase Cardizem.  Will need to bring his BP down gradually over a  period of 2-3 weeks. Sudden drop in BP can cause stroke. So as long his BP is around 123XX123 systolic and he remains asymptomatic, he should be able to go home with close OP follow-up. Explained this to the patient and he understands. 3. ESRD on HD: MWF. Dialysed today. 4. DM on Insulin: Monitor CBG's.Adjust insulin as needed. 5. CP: Likely due to #1. No PE on CTA. 6. DVT prophylaxis: SCD's  Full Code.  Possible  discharge later today once BP is somewhat better.   LOS: 3 days   [name] 04/14/2011, 1:26 PM

## 2011-04-14 NOTE — Progress Notes (Signed)
Pt. On dialysis. BP still up . Will take off more fluid .and reajust BP meds see orders.

## 2011-04-14 NOTE — Discharge Summary (Addendum)
Physician Discharge Summary  Patient ID: [name] MRN: [id number] DOB/AGE: 1956-03-05 55 y.o.  Admit date: 04/11/2011 Discharge date: 04/14/2011  Discharge Diagnoses:  Principal Problem:  *Pulmonary edema Active Problems:  CKD (chronic kidney disease) stage V requiring chronic dialysis  DM (diabetes mellitus), type 2, uncontrolled  HTN (hypertension)  Accelerated hypertension  Discharged Condition: fair  Initial History: 55 year old man who presented to [facility] hospital with shortness of breath. He denied chest pain. He was noted to be markedly hypertensive and volume overloaded. He was transferred to [facility] for dialysis.   Hospital Course:   1. Pulmonary edema/Fluid overload: Improved with hemodialysis. Pateint dialysed twice in the hospital which he tolerated well. Uncontrolled high BP may have contributed to his presentation.  2. Accelerated Hypertension: Medications were adjusted. Dose of cozaar was increased and cardizem was added. Will need to bring his BP down gradually over a period of 2-3 weeks. Sudden drop in BP can cause stroke. So as long his BP is around [id number] systolic and he remains asymptomatic, he should be able to go home with close OP follow-up. Explained this to the patient and he understands.   3. ESRD on HD: MWF. Dialysed today.   4. DM on Insulin: Remained stable during this hospitalization.   5. CP: Likely due to #1. No PE seen on CT.   6. DVT prophylaxis: SCD's  BP improved with medications and he was considered stable for discharge.  PERTINENT LABS  BNP was 24472 at admission.  IMAGING STUDIES Dg Chest 2 View  04/11/2011  *RADIOLOGY REPORT*  Clinical Data: Shortness of breath.  CHEST - 2 VIEW  Comparison: 12/19/2009  Findings: There is cardiomegaly with vascular congestion. Bibasilar opacities and small effusions.  Slight interstitial prominence.  No acute bony abnormality.  IMPRESSION: Cardiomegaly with bibasilar atelectasis or  infiltrates and small effusions.  Slight interstitial prominence could reflect early interstitial edema.  Original Report Authenticated By: Raelyn Number, M.D.   Ct Angio Chest W/cm &/or Wo Cm  04/11/2011  *RADIOLOGY REPORT*  Clinical Data:  Exertional shortness of breath.  Question pulmonary embolism.  End-stage renal disease on chronic dialysis.  CT ANGIOGRAPHY CHEST WITH CONTRAST  Technique:  Multidetector CT imaging of the chest was performed using the standard protocol during bolus administration of intravenous contrast.  Multiplanar CT image reconstructions including MIPs were obtained to evaluate the vascular anatomy.  Contrast: 194mL OMNIPAQUE IOHEXOL 300 MG/ML IV SOLN  Comparison:  Chest radiographs same date.  Findings:  The pulmonary arteries are well opacified with contrast. There is no evidence of acute pulmonary embolism.  The aorta and great vessels appear normal.  There is a small hiatal hernia.  No enlarged mediastinal or hilar lymph nodes are present.  There are small bilateral pleural effusions.  There is no pericardial effusion.  The lungs demonstrate mild dependent atelectasis bilaterally.  In addition, there are diffuse perihilar ground-glass opacities bilaterally.  The visualized upper abdomen appears unremarkable.  Mild gynecomastia is noted bilaterally.  Review of the MIP images confirms the above findings.  IMPRESSION:  1.  No evidence of acute pulmonary embolism. 2.  Bilateral perihilar ground-glass opacities and pleural effusions suspicious for pulmonary edema.  Atypical infection is considered less likely.  Original Report Authenticated By: Vivia Ewing, M.D.    Discharge Exam:  BP improved to 140's/80's. Please see progress note for rest of examination findings.  Disposition: Home  Discharge Orders    Future Orders Please Complete By Expires  Diet - low sodium heart healthy      Increase activity slowly      Discharge instructions      Comments:   Follow up with  Dr. Mare Ferrari to further manage your high blood pressure     Current Discharge Medication List    START taking these medications   Details  diltiazem (CARDIZEM) 60 MG tablet Take 1 tablet (60 mg total) by mouth every 8 (eight) hours. Qty: 90 tablet, Refills: 3      CONTINUE these medications which have CHANGED   Details  losartan (COZAAR) 50 MG tablet Take 1 tablet (50 mg total) by mouth 2 (two) times daily. Qty: 60 tablet, Refills: 3      CONTINUE these medications which have NOT CHANGED   Details  calcium acetate (PHOSLO) 667 MG capsule Take 2,001 mg by mouth 3 (three) times daily with meals.      carvedilol (COREG) 25 MG tablet Take 25 mg by mouth 2 (two) times daily with a meal.      cloNIDine (CATAPRES - DOSED IN MG/24 HR) 0.3 mg/24hr Place 1 patch onto the skin once a week.      insulin aspart protamine-insulin aspart (NOVOLOG 70/30) (70-30) 100 UNIT/ML injection Inject 25-35 Units into the skin 2 (two) times daily with a meal. Injects 35 units every morning and injects 25 units every evening     multivitamin (RENA-VIT) TABS tablet Take 1 tablet by mouth daily.         Follow-up Information    Follow up with Melburn Hake, MD .         Total Discharge Time: 48mins  Signed: [place] [facility] Pager [phone]  04/14/2011, 6:03 PM

## 2011-04-22 NOTE — ED Provider Notes (Signed)
Medical screening examination/treatment/procedure(s) were conducted as a shared visit with non-physician practitioner(s) and myself.  I personally evaluated the patient during the encounter.  Seen by me. Unexplained dyspnea. Will get CT Angio chest  Nat Christen, MD 04/22/11 2302

## 2011-04-23 MED FILL — Albuterol Sulfate Soln Nebu 0.5% (5 MG/ML): RESPIRATORY_TRACT | Qty: 0.5 | Status: AC

## 2011-05-10 ENCOUNTER — Inpatient Hospital Stay (HOSPITAL_COMMUNITY)
Admission: EM | Admit: 2011-05-10 | Discharge: 2011-05-13 | DRG: 682 | Disposition: A | Discharge status: Home / Self Care | Payer: Medicare Other | Attending: Internal Medicine | Admitting: Internal Medicine

## 2011-05-10 ENCOUNTER — Emergency Department (HOSPITAL_COMMUNITY): Payer: Medicare Other

## 2011-05-10 ENCOUNTER — Encounter (HOSPITAL_COMMUNITY): Payer: Self-pay | Admitting: *Deleted

## 2011-05-10 ENCOUNTER — Other Ambulatory Visit: Payer: Self-pay

## 2011-05-10 DIAGNOSIS — J984 Other disorders of lung: Secondary | ICD-10-CM | POA: Diagnosis not present

## 2011-05-10 DIAGNOSIS — J90 Pleural effusion, not elsewhere classified: Secondary | ICD-10-CM | POA: Diagnosis not present

## 2011-05-10 DIAGNOSIS — Z794 Long term (current) use of insulin: Secondary | ICD-10-CM

## 2011-05-10 DIAGNOSIS — Z992 Dependence on renal dialysis: Secondary | ICD-10-CM

## 2011-05-10 DIAGNOSIS — J810 Acute pulmonary edema: Secondary | ICD-10-CM | POA: Diagnosis not present

## 2011-05-10 DIAGNOSIS — I509 Heart failure, unspecified: Secondary | ICD-10-CM | POA: Diagnosis not present

## 2011-05-10 DIAGNOSIS — M25569 Pain in unspecified knee: Secondary | ICD-10-CM | POA: Diagnosis not present

## 2011-05-10 DIAGNOSIS — I10 Essential (primary) hypertension: Secondary | ICD-10-CM | POA: Diagnosis not present

## 2011-05-10 DIAGNOSIS — J811 Chronic pulmonary edema: Secondary | ICD-10-CM | POA: Diagnosis present

## 2011-05-10 DIAGNOSIS — N186 End stage renal disease: Secondary | ICD-10-CM | POA: Diagnosis present

## 2011-05-10 DIAGNOSIS — N189 Chronic kidney disease, unspecified: Secondary | ICD-10-CM

## 2011-05-10 DIAGNOSIS — Z79899 Other long term (current) drug therapy: Secondary | ICD-10-CM | POA: Diagnosis not present

## 2011-05-10 DIAGNOSIS — I517 Cardiomegaly: Secondary | ICD-10-CM | POA: Diagnosis not present

## 2011-05-10 DIAGNOSIS — I120 Hypertensive chronic kidney disease with stage 5 chronic kidney disease or end stage renal disease: Secondary | ICD-10-CM | POA: Diagnosis not present

## 2011-05-10 DIAGNOSIS — IMO0001 Reserved for inherently not codable concepts without codable children: Secondary | ICD-10-CM | POA: Diagnosis present

## 2011-05-10 DIAGNOSIS — E1165 Type 2 diabetes mellitus with hyperglycemia: Secondary | ICD-10-CM | POA: Diagnosis present

## 2011-05-10 DIAGNOSIS — I359 Nonrheumatic aortic valve disorder, unspecified: Secondary | ICD-10-CM | POA: Diagnosis not present

## 2011-05-10 LAB — CBC
HCT: 29.2 % — ABNORMAL LOW (ref 39.0–52.0)
Hemoglobin: 9.6 g/dL — ABNORMAL LOW (ref 13.0–17.0)
MCH: 28.7 pg (ref 26.0–34.0)
MCHC: 32.9 g/dL (ref 30.0–36.0)
MCV: 87.4 fL (ref 78.0–100.0)
Platelets: 224 10*3/uL (ref 150–400)
RBC: 3.34 MIL/uL — ABNORMAL LOW (ref 4.22–5.81)
RDW: 13.5 % (ref 11.5–15.5)
WBC: 8.3 10*3/uL (ref 4.0–10.5)

## 2011-05-10 LAB — DIFFERENTIAL
Basophils Absolute: 0 10*3/uL (ref 0.0–0.1)
Basophils Relative: 0 % (ref 0–1)
Eosinophils Absolute: 0.2 10*3/uL (ref 0.0–0.7)
Eosinophils Relative: 2 % (ref 0–5)
Lymphocytes Relative: 21 % (ref 12–46)
Lymphs Abs: 1.7 10*3/uL (ref 0.7–4.0)
Monocytes Absolute: 0.5 10*3/uL (ref 0.1–1.0)
Monocytes Relative: 6 % (ref 3–12)
Neutro Abs: 5.9 10*3/uL (ref 1.7–7.7)
Neutrophils Relative %: 71 % (ref 43–77)

## 2011-05-10 LAB — BASIC METABOLIC PANEL
BUN: 27 mg/dL — ABNORMAL HIGH (ref 6–23)
CO2: 30 mEq/L (ref 19–32)
Calcium: 10.1 mg/dL (ref 8.4–10.5)
Chloride: 96 mEq/L (ref 96–112)
Creatinine, Ser: 7.31 mg/dL — ABNORMAL HIGH (ref 0.50–1.35)
GFR calc Af Amer: 9 mL/min — ABNORMAL LOW (ref >90)
GFR calc non Af Amer: 7 mL/min — ABNORMAL LOW (ref >90)
Glucose, Bld: 283 mg/dL — ABNORMAL HIGH (ref 70–99)
Potassium: 4 mEq/L (ref 3.5–5.1)
Sodium: 136 mEq/L (ref 135–145)

## 2011-05-10 LAB — PRO B NATRIURETIC PEPTIDE: Pro B Natriuretic peptide (BNP): 11750 pg/mL — ABNORMAL HIGH (ref 0–125)

## 2011-05-10 MED ORDER — HEPARIN SODIUM (PORCINE) 1000 UNIT/ML DIALYSIS: 100.0000 [IU]/kg | INTRAMUSCULAR | Status: DC | PRN

## 2011-05-10 MED ORDER — HYDROCODONE-ACETAMINOPHEN 5-325 MG PO TABS: 1.0000 | ORAL_TABLET | ORAL | Status: DC | PRN

## 2011-05-10 MED ORDER — INSULIN ASPART PROT & ASPART (70-30 MIX) 100 UNIT/ML ~~LOC~~ SUSP
25.0000 [IU] | Freq: Two times a day (BID) | SUBCUTANEOUS | Status: DC
  Administered 2011-05-11 – 2011-05-12 (×3): 35 [IU] via SUBCUTANEOUS
  Filled 2011-05-10: qty 3

## 2011-05-10 MED ORDER — CLONIDINE HCL 0.3 MG PO TABS
0.3000 mg | ORAL_TABLET | Freq: Three times a day (TID) | ORAL | Status: DC
  Administered 2011-05-10 – 2011-05-13 (×8): 0.3 mg via ORAL
  Filled 2011-05-10 (×11): qty 1

## 2011-05-10 MED ORDER — CALCIUM ACETATE 667 MG PO CAPS
2001.0000 mg | ORAL_CAPSULE | Freq: Three times a day (TID) | ORAL | Status: DC
  Administered 2011-05-11 – 2011-05-13 (×7): 2001 mg via ORAL
  Filled 2011-05-10 (×10): qty 3

## 2011-05-10 MED ORDER — LOSARTAN POTASSIUM 50 MG PO TABS
50.0000 mg | ORAL_TABLET | Freq: Two times a day (BID) | ORAL | Status: DC
  Administered 2011-05-10 – 2011-05-13 (×5): 50 mg via ORAL
  Filled 2011-05-10 (×7): qty 1

## 2011-05-10 MED ORDER — CARVEDILOL 25 MG PO TABS
25.0000 mg | ORAL_TABLET | Freq: Two times a day (BID) | ORAL | Status: DC
  Administered 2011-05-10 – 2011-05-11 (×2): 25 mg via ORAL
  Filled 2011-05-10 (×4): qty 1

## 2011-05-10 MED ORDER — DILTIAZEM HCL 60 MG PO TABS
60.0000 mg | ORAL_TABLET | Freq: Three times a day (TID) | ORAL | Status: DC
  Administered 2011-05-10 – 2011-05-11 (×3): 60 mg via ORAL
  Filled 2011-05-10 (×5): qty 1

## 2011-05-10 MED ORDER — CLONIDINE HCL 0.3 MG/24HR TD PTWK
0.3000 mg | MEDICATED_PATCH | TRANSDERMAL | Status: DC
  Administered 2011-05-10: 0.3 mg via TRANSDERMAL
  Filled 2011-05-10: qty 1

## 2011-05-10 MED ORDER — HEPARIN SODIUM (PORCINE) 5000 UNIT/ML IJ SOLN
5000.0000 [IU] | Freq: Three times a day (TID) | INTRAMUSCULAR | Status: DC
  Administered 2011-05-10 – 2011-05-13 (×7): 5000 [IU] via SUBCUTANEOUS
  Filled 2011-05-10 (×12): qty 1

## 2011-05-10 MED ORDER — RENA-VITE PO TABS
1.0000 | ORAL_TABLET | Freq: Every day | ORAL | Status: DC
  Administered 2011-05-10 – 2011-05-13 (×4): 1 via ORAL
  Filled 2011-05-10 (×4): qty 1

## 2011-05-10 MED ORDER — HYDRALAZINE HCL 20 MG/ML IJ SOLN
5.0000 mg | Freq: Four times a day (QID) | INTRAMUSCULAR | Status: DC | PRN
  Administered 2011-05-11: 5 mg via INTRAVENOUS
  Filled 2011-05-10: qty 0.25

## 2011-05-10 NOTE — Consult Note (Signed)
[name] is an 55 y.o. male.  HPI:SOB  Past Medical History  Diagnosis Date  . Renal disorder   . Diabetes mellitus   . Hypertension   . Dialysis care   . Renal failure     History reviewed. No pertinent past surgical history.  History reviewed. No pertinent family history.  Social History:  does not have a smoking history on file. He does not have any smokeless tobacco history on file. He reports that he does not drink alcohol or use illicit drugs.  Allergies: No Known Allergies  Medications: I have reviewed the patient's current medications.  Results for orders placed during the hospital encounter of 05/10/11 (from the past 48 hour(s))  CBC     Status: Abnormal   Collection Time   05/10/11 11:25 AM      Component Value Range Comment   WBC 8.3  4.0 - 10.5 (K/uL)    RBC 3.34 (*) 4.22 - 5.81 (MIL/uL)    Hemoglobin 9.6 (*) 13.0 - 17.0 (g/dL)    HCT 29.2 (*) 39.0 - 52.0 (%)    MCV 87.4  78.0 - 100.0 (fL)    MCH 28.7  26.0 - 34.0 (pg)    MCHC 32.9  30.0 - 36.0 (g/dL)    RDW 13.5  11.5 - 15.5 (%)    Platelets 224  150 - 400 (K/uL)   DIFFERENTIAL     Status: Normal   Collection Time   05/10/11 11:25 AM      Component Value Range Comment   Neutrophils Relative 71  43 - 77 (%)    Neutro Abs 5.9  1.7 - 7.7 (K/uL)    Lymphocytes Relative 21  12 - 46 (%)    Lymphs Abs 1.7  0.7 - 4.0 (K/uL)    Monocytes Relative 6  3 - 12 (%)    Monocytes Absolute 0.5  0.1 - 1.0 (K/uL)    Eosinophils Relative 2  0 - 5 (%)    Eosinophils Absolute 0.2  0.0 - 0.7 (K/uL)    Basophils Relative 0  0 - 1 (%)    Basophils Absolute 0.0  0.0 - 0.1 (K/uL)   BASIC METABOLIC PANEL     Status: Abnormal   Collection Time   05/10/11 11:25 AM      Component Value Range Comment   Sodium 136  135 - 145 (mEq/L)    Potassium 4.0  3.5 - 5.1 (mEq/L)    Chloride 96  96 - 112 (mEq/L)    CO2 30  19 - 32 (mEq/L)    Glucose, Bld 283 (*) 70 - 99 (mg/dL)    BUN 27 (*) 6 - 23 (mg/dL)    Creatinine, Ser 7.31  (*) 0.50 - 1.35 (mg/dL)    Calcium 10.1  8.4 - 10.5 (mg/dL)    GFR calc non Af Amer 7 (*) >90 (mL/min)    GFR calc Af Amer 9 (*) >90 (mL/min)   PRO B NATRIURETIC PEPTIDE     Status: Abnormal   Collection Time   05/10/11 11:25 AM      Component Value Range Comment   Pro B Natriuretic peptide (BNP) 11750.0 (*) 0 - 125 (pg/mL)     Dg Chest 2 View  05/10/2011  *RADIOLOGY REPORT*  Clinical Data: Shortness of breath  CHEST - 2 VIEW  Comparison: 04/11/2011  Findings: Cardiomegaly again noted.  Central mild vascular congestion and mild perihilar interstitial prominence suspicious for mild interstitial edema.  Question small  right pleural effusion.  No focal infiltrate.  IMPRESSION: Central mild vascular congestion and mild perihilar interstitial prominence suspicious for mild interstitial edema.  Question small right pleural effusion.  Original Report Authenticated By: Lahoma Crocker, M.D.    Blood pressure 159/93, pulse 42, temperature 97.6 F (36.4 C), temperature source Oral, resp. rate 16, SpO2 100.00%.  Physical Exam:  General appearance: alert, cooperative and appears stated age Head: Normocephalic, without obvious abnormality, atraumatic Eyes: conjunctivae/corneas clear. PERRL, EOM's intact. Fundi benign. Ears: normal TM's and external ear canals both ears Nose: Nares normal. Septum midline. Mucosa normal. No drainage or sinus tenderness. Throat: lips, mucosa, and tongue normal; teeth and gums normal Resp:bilateral rales. Dialysis today. Chest wall: no tenderness Cardio: regular rate and rhythm, S1, S2 normal, no murmur, click, rub or gallop GI: soft, non-tender; bowel sounds normal; no masses,  no organomegaly Extremities: extremities normal, atraumatic, no cyanosis or edema Skin: Skin color, texture, turgor normal. No rashes or lesions Neurologic: Grossly normal  Assessment/Plan: 1. ESRD 2. HTN  Grant Fontana, MD 05/10/2011, 1:54 PM

## 2011-05-10 NOTE — ED Notes (Signed)
Pt undressed and is in gown. Pt placed on cardiac monitor, bp cuff, and pulse ox

## 2011-05-10 NOTE — ED Notes (Signed)
4730-01 READY

## 2011-05-10 NOTE — H&P (Signed)
Hospital Admission Note Date: 05/10/2011  Patient name: [name]           Medical record number: [id number] Date of birth: August 25, 1955           Age: 55 y.o.   Gender: male    PCP:   Melburn Hake, MD, MD   Chief Complaint:  SOB  HPI: [name] is a 55 y.o. male with medical history of any seasonal disease on hemodialysis. Patient also have hypertension and diabetes mellitus. Patient came into the hospital complaining about shortness of breath. Patient getting his dialysis on Monday, Wednesday and Friday. He said last time he got his dialysis on Monday and it took about 2 and half liters out of her system. He started to have shortness of breath yesterday. Patient denies any fever any chills. Denies any cough or sputum production, denies any chest pain palpitations and denies any wheezing. Upon initial evaluation in the emergency department chest x-ray showed mild pulmonary edema. Patient admitted to the hospital for further evaluation  Past Medical History: Past Medical History  Diagnosis Date  . Renal disorder   . Diabetes mellitus   . Hypertension   . Dialysis care   . Renal failure    History reviewed. No pertinent past surgical history.  Medications: Prior to Admission medications   Medication Sig Start Date End Date Taking? Authorizing Provider  calcium acetate (PHOSLO) 667 MG capsule Take 2,001 mg by mouth 3 (three) times daily with meals.     Yes Historical Provider, MD  carvedilol (COREG) 25 MG tablet Take 25 mg by mouth 2 (two) times daily with a meal.     Yes Historical Provider, MD  cloNIDine (CATAPRES) 0.3 MG tablet Take 0.3 mg by mouth 3 (three) times daily.     Yes Historical Provider, MD  diltiazem (CARDIZEM) 60 MG tablet Take 1 tablet (60 mg total) by mouth every 8 (eight) hours. 04/14/11 04/13/12 Yes Gokul Krishnan  insulin aspart protamine-insulin aspart (NOVOLOG 70/30) (70-30) 100 UNIT/ML injection Inject 25-35 Units into the skin 2 (two) times daily  with a meal. Injects 35 units every morning and injects 25 units every evening   Yes Historical Provider, MD  losartan (COZAAR) 50 MG tablet Take 1 tablet (50 mg total) by mouth 2 (two) times daily. 04/14/11 04/13/12 Yes Gokul Krishnan  multivitamin (RENA-VIT) TABS tablet Take 1 tablet by mouth daily.     Yes Historical Provider, MD  cloNIDine (CATAPRES - DOSED IN MG/24 HR) 0.3 mg/24hr Place 1 patch onto the skin once a week.      Historical Provider, MD    Allergies:  No Known Allergies  Social History:  reports that he has never smoked. He does not have any smokeless tobacco history on file. He reports that he does not drink alcohol or use illicit drugs.  Family History: History reviewed. No pertinent family history.  Review of Systems:  Constitutional: negative for anorexia, fevers and sweats Eyes: negative for irritation, redness and visual disturbance Ears, nose, mouth, throat, and face: negative for earaches, epistaxis, nasal congestion and sore throat Respiratory: negative for cough, dyspnea on exertion, sputum and wheezing Cardiovascular: negative for chest pain, dyspnea, lower extremity edema, orthopnea, palpitations and syncope Gastrointestinal: negative for abdominal pain, constipation, diarrhea, melena, nausea and vomiting Genitourinary:negative for dysuria, frequency and hematuria Hematologic/lymphatic: negative for bleeding, easy bruising and lymphadenopathy Musculoskeletal:negative for arthralgias, muscle weakness and stiff joints Neurological: negative for coordination problems, gait problems, headaches and weakness Endocrine:  negative for diabetic symptoms including polydipsia, polyuria and weight loss Allergic/Immunologic: negative for anaphylaxis, hay fever and urticaria  Physical Exam: BP 192/89  Pulse 67  Temp(Src) 97.4 F (36.3 C) (Oral)  Resp 22  SpO2 100% General appearance: alert, cooperative and no distress  Head: Normocephalic, without obvious  abnormality, atraumatic  Eyes: conjunctivae/corneas clear. PERRL, EOM's intact. Fundi benign.  Nose: Nares normal. Septum midline. Mucosa normal. No drainage or sinus tenderness.  Throat: lips, mucosa, and tongue normal; teeth and gums normal  Neck: Supple, no masses, no cervical lymphadenopathy, no JVD appreciated, no meningeal signs Resp: clear to auscultation bilaterally  Chest wall: no tenderness  Cardio: regular rate and rhythm, S1, S2 normal, no murmur, click, rub or gallop  GI: soft, non-tender; bowel sounds normal; no masses, no organomegaly  Extremities: extremities normal, atraumatic, no cyanosis or edema  Skin: Skin color, texture, turgor normal. No rashes or lesions  Neurologic: Alert and oriented X 3, normal strength and tone. Normal symmetric reflexes. Normal coordination and gait  Labs on Admission:   [facility] 05/10/11 1125  NA 136  K 4.0  CL 96  CO2 30  GLUCOSE 283*  BUN 27*  CREATININE 7.31*  CALCIUM 10.1  MG --  PHOS --   Basename 05/10/11 1125  WBC 8.3  NEUTROABS 5.9  HGB 9.6*  HCT 29.2*  MCV 87.4  PLT 224    Radiological Exams on Admission: Dg Chest 2 View  05/10/2011  *RADIOLOGY REPORT*  Clinical Data: Shortness of breath  CHEST - 2 VIEW  Comparison: 04/11/2011  Findings: Cardiomegaly again noted.  Central mild vascular congestion and mild perihilar interstitial prominence suspicious for mild interstitial edema.  Question small right pleural effusion.  No focal infiltrate.  IMPRESSION: Central mild vascular congestion and mild perihilar interstitial prominence suspicious for mild interstitial edema.  Question small right pleural effusion.  Original Report Authenticated By: Lahoma Crocker, M.D.   Dg Chest 2 View  04/11/2011  *RADIOLOGY REPORT*  Clinical Data: Shortness of breath.  CHEST - 2 VIEW  Comparison: 12/19/2009  Findings: There is cardiomegaly with vascular congestion. Bibasilar opacities and small effusions.  Slight interstitial prominence.  No acute  bony abnormality.  IMPRESSION: Cardiomegaly with bibasilar atelectasis or infiltrates and small effusions.  Slight interstitial prominence could reflect early interstitial edema.  Original Report Authenticated By: Raelyn Number, M.D.   Ct Angio Chest W/cm &/or Wo Cm  04/11/2011  *RADIOLOGY REPORT*  Clinical Data:  Exertional shortness of breath.  Question pulmonary embolism.  End-stage renal disease on chronic dialysis.  CT ANGIOGRAPHY CHEST WITH CONTRAST  Technique:  Multidetector CT imaging of the chest was performed using the standard protocol during bolus administration of intravenous contrast.  Multiplanar CT image reconstructions including MIPs were obtained to evaluate the vascular anatomy.  Contrast: 140mL OMNIPAQUE IOHEXOL 300 MG/ML IV SOLN  Comparison:  Chest radiographs same date.  Findings:  The pulmonary arteries are well opacified with contrast. There is no evidence of acute pulmonary embolism.  The aorta and great vessels appear normal.  There is a small hiatal hernia.  No enlarged mediastinal or hilar lymph nodes are present.  There are small bilateral pleural effusions.  There is no pericardial effusion.  The lungs demonstrate mild dependent atelectasis bilaterally.  In addition, there are diffuse perihilar ground-glass opacities bilaterally.  The visualized upper abdomen appears unremarkable.  Mild gynecomastia is noted bilaterally.  Review of the MIP images confirms the above findings.  IMPRESSION:  1.  No evidence of  acute pulmonary embolism. 2.  Bilateral perihilar ground-glass opacities and pleural effusions suspicious for pulmonary edema.  Atypical infection is considered less likely.  Original Report Authenticated By: Vivia Ewing, M.D.     IMPRESSION: Present on Admission:  .Pulmonary edema .DM (diabetes mellitus), type 2, uncontrolled .HTN (hypertension) .ESRD on dialysis  Assessment/Plan  1. Pulmonary edema: This is likely noncardiogenic pulmonary edema secondary to  fluid overload. Patient is on dialysis per nephrology. I will get 12 EKG rule out acute coronary syndrome. And get 2-D echocardiogram. Nephrology already consulted for dialysis. Patient is going to dialysis tonight.  2. ESRD: On hemodialysis MWF, Dr. Mare Ferrari and his nephrologist and he is being consulted for dialysis.  3. DM 2: I will check hemoglobin A1c. Patient is on NovoLog mix 70/30. I will restart his home dose.  4. HTN: Uncontrolled patient been on the 3-4 blood pressure medications. I suspect part of the high blood-pressure is Valium. Patient is going to dialysis today. I will restart his home medications. I will start hydralazine 5 mg IV as needed for high blood pressure. We'll check the blood pressure after the dialysis and some fluids removed.  Zonnie Landen A 05/10/2011, 5:23 PM

## 2011-05-10 NOTE — ED Provider Notes (Signed)
History     CSN: [id number]  Arrival date & time 05/10/11  1045   First MD Initiated Contact with Patient 05/10/11 1104      Chief Complaint  Patient presents with  . Shortness of Breath    (Consider location/radiation/quality/duration/timing/severity/associated sxs/prior treatment) Patient is a 55 y.o. male presenting with shortness of breath. The history is provided by the patient.  Shortness of Breath  Associated symptoms include shortness of breath.   patient had been short of breath since yesterday. Patient had his normal dialysis yesterday. Does note some nonproductive cough. Symptoms are worse with lying flat and exertion better with rest. Denies any chest pain or diaphoresis. No recent vomiting or diarrhea. Denies any abdominal pain or headache  Past Medical History  Diagnosis Date  . Renal disorder   . Diabetes mellitus   . Hypertension   . Dialysis care   . Renal failure     History reviewed. No pertinent past surgical history.  History reviewed. No pertinent family history.  History  Substance Use Topics  . Smoking status: Not on file  . Smokeless tobacco: Not on file  . Alcohol Use: No      Review of Systems  Respiratory: Positive for shortness of breath.   All other systems reviewed and are negative.    Allergies  Review of patient's allergies indicates no known allergies.  Home Medications   Current Outpatient Rx  Name Route Sig Dispense Refill  . CALCIUM ACETATE 667 MG PO CAPS Oral Take 2,001 mg by mouth 3 (three) times daily with meals.      Marland Kitchen CARVEDILOL 25 MG PO TABS Oral Take 25 mg by mouth 2 (two) times daily with a meal.      . CLONIDINE HCL 0.3 MG PO TABS Oral Take 0.3 mg by mouth 3 (three) times daily.      Marland Kitchen DILTIAZEM HCL 60 MG PO TABS Oral Take 1 tablet (60 mg total) by mouth every 8 (eight) hours. 90 tablet 3  . INSULIN ASPART PROT & ASPART (70-30) 100 UNIT/ML [place] SUSP Subcutaneous Inject 25-35 Units into the skin 2 (two) times daily  with a meal. Injects 35 units every morning and injects 25 units every evening    . LOSARTAN POTASSIUM 50 MG PO TABS Oral Take 1 tablet (50 mg total) by mouth 2 (two) times daily. 60 tablet 3  . RENA-VITE PO TABS Oral Take 1 tablet by mouth daily.      Marland Kitchen CLONIDINE HCL 0.3 MG/24HR TD PTWK Transdermal Place 1 patch onto the skin once a week.        BP 148/73  Pulse 55  Temp(Src) 97.6 F (36.4 C) (Oral)  Resp 18  SpO2 94%  Physical Exam  Nursing note and vitals reviewed. Constitutional: He is oriented to person, place, and time. He appears well-developed and well-nourished.  Non-toxic appearance. No distress.  HENT:  Head: Normocephalic and atraumatic.  Eyes: Conjunctivae, EOM and lids are normal. Pupils are equal, round, and reactive to light.  Neck: Normal range of motion. Neck supple. No tracheal deviation present. No mass present.  Cardiovascular: Normal rate, regular rhythm and normal heart sounds.  Exam reveals no gallop.   No murmur heard. Pulmonary/Chest: Effort normal. No stridor. No respiratory distress. He has no decreased breath sounds. He has no wheezes. He has rhonchi. He has no rales.  Abdominal: Soft. Normal appearance and bowel sounds are normal. He exhibits no distension. There is no tenderness. There is no rebound  and no CVA tenderness.  Musculoskeletal: Normal range of motion. He exhibits no edema and no tenderness.  Neurological: He is alert and oriented to person, place, and time. He has normal strength. No cranial nerve deficit or sensory deficit. GCS eye subscore is 4. GCS verbal subscore is 5. GCS motor subscore is 6.  Skin: Skin is warm and dry. No abrasion and no rash noted.  Psychiatric: He has a normal mood and affect. His speech is normal and behavior is normal.    ED Course  Procedures (including critical care time)   Labs Reviewed  CBC  DIFFERENTIAL  BASIC METABOLIC PANEL  PRO B NATRIURETIC PEPTIDE   No results found.   No diagnosis  found.    MDM   Date: 05/10/2011  Rate: 61  Rhythm: normal sinus rhythm  QRS Axis: normal  Intervals: normal  ST/T Wave abnormalities: nonspecific ST changes  Conduction Disutrbances:none  Narrative Interpretation:   Old EKG Reviewed: changes noted    1:08 PM Spoke with Dr. Gordy Savers, he will come and see patient in the ED      Leota Jacobsen, MD 05/10/11 1308

## 2011-05-10 NOTE — ED Notes (Signed)
Taken to xray at this time. 

## 2011-05-10 NOTE — ED Notes (Signed)
Attempted to call report, RN unable to take. Will return call.

## 2011-05-10 NOTE — ED Notes (Signed)
Pt reports being sob since last night, last dialysis was yesterday. Reports being admitted recently for "fluid in his lungs."

## 2011-05-10 NOTE — Consult Note (Signed)
[name] is an 55 y.o. male.  [id number] dialysis.  Past Medical History  Diagnosis Date  . Renal disorder   . Diabetes mellitus   . Hypertension   . Dialysis care   . Renal failure     History reviewed. No pertinent past surgical history.  History reviewed. No pertinent family history.  Social History:  does not have a smoking history on file. He does not have any smokeless tobacco history on file. He reports that he does not drink alcohol or use illicit drugs.  Allergies: No Known Allergies  Medications: I have reviewed the patient's current medications.  Results for orders placed during the hospital encounter of 05/10/11 (from the past 48 hour(s))  CBC     Status: Abnormal   Collection Time   05/10/11 11:25 AM      Component Value Range Comment   WBC 8.3  4.0 - 10.5 (K/uL)    RBC 3.34 (*) 4.22 - 5.81 (MIL/uL)    Hemoglobin 9.6 (*) 13.0 - 17.0 (g/dL)    HCT 29.2 (*) 39.0 - 52.0 (%)    MCV 87.4  78.0 - 100.0 (fL)    MCH 28.7  26.0 - 34.0 (pg)    MCHC 32.9  30.0 - 36.0 (g/dL)    RDW 13.5  11.5 - 15.5 (%)    Platelets 224  150 - 400 (K/uL)   DIFFERENTIAL     Status: Normal   Collection Time   05/10/11 11:25 AM      Component Value Range Comment   Neutrophils Relative 71  43 - 77 (%)    Neutro Abs 5.9  1.7 - 7.7 (K/uL)    Lymphocytes Relative 21  12 - 46 (%)    Lymphs Abs 1.7  0.7 - 4.0 (K/uL)    Monocytes Relative 6  3 - 12 (%)    Monocytes Absolute 0.5  0.1 - 1.0 (K/uL)    Eosinophils Relative 2  0 - 5 (%)    Eosinophils Absolute 0.2  0.0 - 0.7 (K/uL)    Basophils Relative 0  0 - 1 (%)    Basophils Absolute 0.0  0.0 - 0.1 (K/uL)   BASIC METABOLIC PANEL     Status: Abnormal   Collection Time   05/10/11 11:25 AM      Component Value Range Comment   Sodium 136  135 - 145 (mEq/L)    Potassium 4.0  3.5 - 5.1 (mEq/L)    Chloride 96  96 - 112 (mEq/L)    CO2 30  19 - 32 (mEq/L)    Glucose, Bld 283 (*) 70 - 99 (mg/dL)    BUN 27 (*) 6 - 23 (mg/dL)    Creatinine, Ser 7.31 (*) 0.50 - 1.35 (mg/dL)    Calcium 10.1  8.4 - 10.5 (mg/dL)    GFR calc non Af Amer 7 (*) >90 (mL/min)    GFR calc Af Amer 9 (*) >90 (mL/min)   PRO B NATRIURETIC PEPTIDE     Status: Abnormal   Collection Time   05/10/11 11:25 AM      Component Value Range Comment   Pro B Natriuretic peptide (BNP) 11750.0 (*) 0 - 125 (pg/mL)     Dg Chest 2 View  05/10/2011  *RADIOLOGY REPORT*  Clinical Data: Shortness of breath  CHEST - 2 VIEW  Comparison: 04/11/2011  Findings: Cardiomegaly again noted.  Central mild vascular congestion and mild perihilar interstitial prominence suspicious for mild interstitial edema.  Question small  right pleural effusion.  No focal infiltrate.  IMPRESSION: Central mild vascular congestion and mild perihilar interstitial prominence suspicious for mild interstitial edema.  Question small right pleural effusion.  Original Report Authenticated By: Lahoma Crocker, M.D.    Blood pressure 159/93, pulse 42, temperature 97.6 F (36.4 C), temperature source Oral, resp. rate 16, SpO2 100.00%.  Physical Exam:  General appearance: alert, cooperative and appears stated age Head: Normocephalic, without obvious abnormality, atraumatic Eyes: conjunctivae/corneas clear. PERRL, EOM's intact. Fundi benign. Ears: normal TM's and external ear canals both ears Nose: Nares normal. Septum midline. Mucosa normal. No drainage or sinus tenderness. Throat: lips, mucosa, and tongue normal; teeth and gums normal Resp: clear to auscultation bilaterally Chest wall: no tenderness Cardio: regular rate and rhythm, S1, S2 normal, no murmur, click, rub or gallop GI: soft, non-tender; bowel sounds normal; no masses,  no organomegaly Extremities: extremities normal, atraumatic, no cyanosis or edema Skin: Skin color, texture, turgor normal. No rashes or lesions Neurologic: Grossly normal  Assessment/Plan: 1. ESRD 2. HTN  Grant Fontana, MD 05/10/2011, 1:42 PM

## 2011-05-10 NOTE — Progress Notes (Signed)
Pt's BP was 192/89, MD notified; no orders given will continue to monitor patient___________________D. Owens Shark RN

## 2011-05-11 ENCOUNTER — Observation Stay (HOSPITAL_COMMUNITY): Payer: Medicare Other

## 2011-05-11 ENCOUNTER — Other Ambulatory Visit: Payer: Self-pay

## 2011-05-11 DIAGNOSIS — I517 Cardiomegaly: Secondary | ICD-10-CM | POA: Diagnosis not present

## 2011-05-11 DIAGNOSIS — I10 Essential (primary) hypertension: Secondary | ICD-10-CM | POA: Diagnosis not present

## 2011-05-11 DIAGNOSIS — N186 End stage renal disease: Secondary | ICD-10-CM | POA: Diagnosis not present

## 2011-05-11 DIAGNOSIS — J984 Other disorders of lung: Secondary | ICD-10-CM | POA: Diagnosis not present

## 2011-05-11 DIAGNOSIS — IMO0001 Reserved for inherently not codable concepts without codable children: Secondary | ICD-10-CM | POA: Diagnosis not present

## 2011-05-11 DIAGNOSIS — J90 Pleural effusion, not elsewhere classified: Secondary | ICD-10-CM | POA: Diagnosis not present

## 2011-05-11 DIAGNOSIS — M25569 Pain in unspecified knee: Secondary | ICD-10-CM | POA: Diagnosis not present

## 2011-05-11 LAB — CBC
HCT: 27.3 % — ABNORMAL LOW (ref 39.0–52.0)
Hemoglobin: 9 g/dL — ABNORMAL LOW (ref 13.0–17.0)
MCH: 28.5 pg (ref 26.0–34.0)
MCHC: 33 g/dL (ref 30.0–36.0)
MCV: 86.4 fL (ref 78.0–100.0)
Platelets: 219 10*3/uL (ref 150–400)
RBC: 3.16 MIL/uL — ABNORMAL LOW (ref 4.22–5.81)
RDW: 13.4 % (ref 11.5–15.5)
WBC: 6.6 10*3/uL (ref 4.0–10.5)

## 2011-05-11 LAB — DIFFERENTIAL
Basophils Absolute: 0 10*3/uL (ref 0.0–0.1)
Basophils Relative: 0 % (ref 0–1)
Eosinophils Absolute: 0.3 10*3/uL (ref 0.0–0.7)
Eosinophils Relative: 4 % (ref 0–5)
Lymphocytes Relative: 26 % (ref 12–46)
Lymphs Abs: 1.8 10*3/uL (ref 0.7–4.0)
Monocytes Absolute: 0.4 10*3/uL (ref 0.1–1.0)
Monocytes Relative: 5 % (ref 3–12)
Neutro Abs: 4.2 10*3/uL (ref 1.7–7.7)
Neutrophils Relative %: 64 % (ref 43–77)

## 2011-05-11 LAB — GLUCOSE, CAPILLARY: Glucose-Capillary: 254 mg/dL — ABNORMAL HIGH (ref 70–99)

## 2011-05-11 LAB — RENAL FUNCTION PANEL
Albumin: 3.1 g/dL — ABNORMAL LOW (ref 3.5–5.2)
BUN: 43 mg/dL — ABNORMAL HIGH (ref 6–23)
CO2: 27 mEq/L (ref 19–32)
Calcium: 9.7 mg/dL (ref 8.4–10.5)
Chloride: 98 mEq/L (ref 96–112)
Creatinine, Ser: 9.25 mg/dL — ABNORMAL HIGH (ref 0.50–1.35)
GFR calc Af Amer: 7 mL/min — ABNORMAL LOW (ref >90)
GFR calc non Af Amer: 6 mL/min — ABNORMAL LOW (ref >90)
Glucose, Bld: 197 mg/dL — ABNORMAL HIGH (ref 70–99)
Phosphorus: 2.3 mg/dL (ref 2.3–4.6)
Potassium: 3.6 mEq/L (ref 3.5–5.1)
Sodium: 136 mEq/L (ref 135–145)

## 2011-05-11 LAB — HEMOGLOBIN A1C
Hgb A1c MFr Bld: 9.9 % — ABNORMAL HIGH (ref <5.7)
Mean Plasma Glucose: 237 mg/dL — ABNORMAL HIGH (ref <117)

## 2011-05-11 MED ORDER — HEPARIN SODIUM (PORCINE) 1000 UNIT/ML DIALYSIS
100.0000 [IU]/kg | INTRAMUSCULAR | Status: DC | PRN
  Administered 2011-05-11: 8800 [IU] via INTRAVENOUS_CENTRAL

## 2011-05-11 MED ORDER — AMLODIPINE BESYLATE 10 MG PO TABS
10.0000 mg | ORAL_TABLET | Freq: Every day | ORAL | Status: DC
  Administered 2011-05-11 – 2011-05-13 (×3): 10 mg via ORAL
  Filled 2011-05-11 (×3): qty 1

## 2011-05-11 MED ORDER — CARVEDILOL 25 MG PO TABS
25.0000 mg | ORAL_TABLET | Freq: Two times a day (BID) | ORAL | Status: DC
  Administered 2011-05-11 – 2011-05-13 (×4): 25 mg via ORAL
  Filled 2011-05-11 (×6): qty 1

## 2011-05-11 NOTE — Progress Notes (Signed)
DAILY PROGRESS NOTE                              [facility]  SUBJECTIVE: Had a dialysis this morning. The shortness of breath.  OBJECTIVE: BP 173/88  Pulse 110  Temp(Src) 98.3 F (36.8 C) (Oral)  Resp 16  Ht 5\' 11"  (1.803 m)  Wt 84 kg (185 lb 3 oz)  BMI 25.83 kg/m2  SpO2 100%  Intake/Output Summary (Last 24 hours) at 05/11/11 1405 Last data filed at 05/11/11 1343  Gross per 24 hour  Intake    960 ml  Output   4941 ml  Net  -3981 ml                      Weight change:  Physical Exam: General: Alert and awake oriented x3 not in any acute distress. HEENT: anicteric sclera, pupils equal reactive to light and accommodation CVS: S1-S2 heard, no murmur rubs or gallops Chest: clear to auscultation bilaterally, no wheezing rales or rhonchi Abdomen:  normal bowel sounds, soft, nontender, nondistended, no organomegaly Neuro: Cranial nerves II-XII intact, no focal neurological deficits Extremities: no cyanosis, no clubbing or edema noted bilaterally   Lab Results:  [facility] 05/11/11 0643 05/10/11 1125  NA 136 136  K 3.6 4.0  CL 98 96  CO2 27 30  GLUCOSE 197* 283*  BUN 43* 27*  CREATININE 9.25* 7.31*  CALCIUM 9.7 10.1  MG -- --  PHOS 2.3 --    Basename 05/11/11 0643  AST --  ALT --  ALKPHOS --  BILITOT --  PROT --  ALBUMIN 3.1*   Basename 05/11/11 0644 05/10/11 1125  WBC 6.6 8.3  NEUTROABS 4.2 5.9  HGB 9.0* 9.6*  HCT 27.3* 29.2*  MCV 86.4 87.4  PLT 219 224   Basename 05/10/11 1802  HGBA1C 9.9*   Micro Results: No results found for this or any previous visit (from the past 240 hour(s)).  Studies/Results: Dg Chest 2 View  05/10/2011  *RADIOLOGY REPORT*  Clinical Data: Shortness of breath  CHEST - 2 VIEW  Comparison: 04/11/2011  Findings: Cardiomegaly again noted.  Central mild vascular congestion and mild perihilar interstitial prominence suspicious for mild interstitial edema.  Question small right pleural effusion.  No  focal infiltrate.  IMPRESSION: Central mild vascular congestion and mild perihilar interstitial prominence suspicious for mild interstitial edema.  Question small right pleural effusion.  Original Report Authenticated By: Lahoma Crocker, M.D.   Dg Chest 2 View  04/11/2011  *RADIOLOGY REPORT*  Clinical Data: Shortness of breath.  CHEST - 2 VIEW  Comparison: 12/19/2009  Findings: There is cardiomegaly with vascular congestion. Bibasilar opacities and small effusions.  Slight interstitial prominence.  No acute bony abnormality.  IMPRESSION: Cardiomegaly with bibasilar atelectasis or infiltrates and small effusions.  Slight interstitial prominence could reflect early interstitial edema.  Original Report Authenticated By: Raelyn Number, M.D.   Ct Angio Chest W/cm &/or Wo Cm  04/11/2011  *RADIOLOGY REPORT*  Clinical Data:  Exertional shortness of breath.  Question pulmonary embolism.  End-stage renal disease on chronic dialysis.  CT ANGIOGRAPHY CHEST WITH CONTRAST  Technique:  Multidetector CT imaging of the chest was performed using the standard protocol during bolus administration of intravenous contrast.  Multiplanar CT image reconstructions including MIPs were obtained to evaluate the vascular anatomy.  Contrast: 163mL OMNIPAQUE IOHEXOL 300 MG/ML IV SOLN  Comparison:  Chest radiographs same date.  Findings:  The pulmonary arteries are well opacified with contrast. There is no evidence of acute pulmonary embolism.  The aorta and great vessels appear normal.  There is a small hiatal hernia.  No enlarged mediastinal or hilar lymph nodes are present.  There are small bilateral pleural effusions.  There is no pericardial effusion.  The lungs demonstrate mild dependent atelectasis bilaterally.  In addition, there are diffuse perihilar ground-glass opacities bilaterally.  The visualized upper abdomen appears unremarkable.  Mild gynecomastia is noted bilaterally.  Review of the MIP images confirms the above findings.   IMPRESSION:  1.  No evidence of acute pulmonary embolism. 2.  Bilateral perihilar ground-glass opacities and pleural effusions suspicious for pulmonary edema.  Atypical infection is considered less likely.  Original Report Authenticated By: Vivia Ewing, M.D.   Medications: Scheduled Meds:   . calcium acetate  2,001 mg Oral TID WC  . carvedilol  25 mg Oral BID WC  . cloNIDine  0.3 mg Transdermal Weekly  . cloNIDine  0.3 mg Oral TID  . diltiazem  60 mg Oral Q8H  . heparin  5,000 Units Subcutaneous Q8H  . insulin aspart protamine-insulin aspart  25-35 Units Subcutaneous BID WC  . losartan  50 mg Oral BID  . multivitamin  1 tablet Oral Daily   Continuous Infusions:  PRN Meds:.heparin, hydrALAZINE, HYDROcodone-acetaminophen, DISCONTD: heparin  ASSESSMENT & PLAN: Active Problems:  DM (diabetes mellitus), type 2, uncontrolled  HTN (hypertension)  Pulmonary edema  ESRD on dialysis  1. Pulmonary edema: This is likely noncardiogenic pulmonary edema secondary to fluid overload from end-stage renal disease. Dr. Gordy Savers is dialyzing the patient. His shortness of breath is getting better. Probably patient will have dialysis treatment tomorrow also.  2. Uncontrolled hypertension: This is likely affected also by the volume overload. Patient is getting dialysis I will adjust his medications. Patient is on multiple medications including clonidine carvedilol losartan and diltiazem. Patient heart rate in the low side I will switch to Cardizem and amlodipine.  3. ESRD: On hemodialysis per Dr. Mare Ferrari.  4. DM 2: Uncontrolled with hemoglobin A1c of last 9.9. We'll control as 70/30 mix here. Patient might need increase in his dose.      LOS: 1 day   [name] A 05/11/2011, 2:05 PM

## 2011-05-12 ENCOUNTER — Inpatient Hospital Stay (HOSPITAL_COMMUNITY): Payer: Medicare Other

## 2011-05-12 DIAGNOSIS — I359 Nonrheumatic aortic valve disorder, unspecified: Secondary | ICD-10-CM

## 2011-05-12 LAB — GLUCOSE, CAPILLARY
Glucose-Capillary: 235 mg/dL — ABNORMAL HIGH (ref 70–99)
Glucose-Capillary: 149 mg/dL — ABNORMAL HIGH (ref 70–99)
Glucose-Capillary: 102 mg/dL — ABNORMAL HIGH (ref 70–99)

## 2011-05-12 LAB — URINE MICROSCOPIC-ADD ON

## 2011-05-12 LAB — URINALYSIS, ROUTINE W REFLEX MICROSCOPIC
Bilirubin Urine: NEGATIVE
Glucose, UA: 100 mg/dL — AB
Ketones, ur: NEGATIVE mg/dL
Nitrite: NEGATIVE
Protein, ur: >300 mg/dL — AB
Specific Gravity, Urine: 1.015 (ref 1.005–1.030)
Urobilinogen, UA: 1 mg/dL (ref 0.0–1.0)
pH: 8 (ref 5.0–8.0)

## 2011-05-12 LAB — BASIC METABOLIC PANEL
BUN: 34 mg/dL — ABNORMAL HIGH (ref 6–23)
CO2: 27 mEq/L (ref 19–32)
Calcium: 10.1 mg/dL (ref 8.4–10.5)
Chloride: 95 mEq/L — ABNORMAL LOW (ref 96–112)
Creatinine, Ser: 6.94 mg/dL — ABNORMAL HIGH (ref 0.50–1.35)
GFR calc Af Amer: 9 mL/min — ABNORMAL LOW (ref >90)
GFR calc non Af Amer: 8 mL/min — ABNORMAL LOW (ref >90)
Glucose, Bld: 82 mg/dL (ref 70–99)
Potassium: 3.6 mEq/L (ref 3.5–5.1)
Sodium: 136 mEq/L (ref 135–145)

## 2011-05-12 LAB — CBC
HCT: 33.7 % — ABNORMAL LOW (ref 39.0–52.0)
Hemoglobin: 10.9 g/dL — ABNORMAL LOW (ref 13.0–17.0)
MCH: 28.3 pg (ref 26.0–34.0)
MCHC: 32.3 g/dL (ref 30.0–36.0)
MCV: 87.5 fL (ref 78.0–100.0)
Platelets: 292 10*3/uL (ref 150–400)
RBC: 3.85 MIL/uL — ABNORMAL LOW (ref 4.22–5.81)
RDW: 13.6 % (ref 11.5–15.5)
WBC: 8.2 10*3/uL (ref 4.0–10.5)

## 2011-05-12 MED ORDER — LIDOCAINE HCL (PF) 1 % IJ SOLN: 5.0000 mL | INTRAMUSCULAR | Status: DC | PRN

## 2011-05-12 MED ORDER — HYDRALAZINE HCL 20 MG/ML IJ SOLN
10.0000 mg | Freq: Four times a day (QID) | INTRAMUSCULAR | Status: DC | PRN
  Filled 2011-05-12: qty 0.5

## 2011-05-12 MED ORDER — SODIUM CHLORIDE 0.9 % IV SOLN: 100.0000 mL | INTRAVENOUS | Status: DC | PRN

## 2011-05-12 MED ORDER — ALTEPLASE 2 MG IJ SOLR: 2.0000 mg | Freq: Once | INTRAMUSCULAR | Status: AC | PRN

## 2011-05-12 MED ORDER — LIDOCAINE-PRILOCAINE 2.5-2.5 % EX CREA: 1.0000 "application " | TOPICAL_CREAM | CUTANEOUS | Status: DC | PRN

## 2011-05-12 MED ORDER — PENTAFLUOROPROP-TETRAFLUOROETH EX AERO: 1.0000 "application " | INHALATION_SPRAY | CUTANEOUS | Status: DC | PRN

## 2011-05-12 MED ORDER — HEPARIN SODIUM (PORCINE) 1000 UNIT/ML DIALYSIS: 1000.0000 [IU] | INTRAMUSCULAR | Status: DC | PRN

## 2011-05-12 MED ORDER — SODIUM CHLORIDE 0.9 % IJ SOLN
3.0000 mL | Freq: Two times a day (BID) | INTRAMUSCULAR | Status: DC
  Administered 2011-05-13 (×2): 3 mL via INTRAVENOUS

## 2011-05-12 MED ORDER — HEPARIN SODIUM (PORCINE) 1000 UNIT/ML DIALYSIS
20.0000 [IU]/kg | INTRAMUSCULAR | Status: DC | PRN
  Administered 2011-05-12: 1700 [IU] via INTRAVENOUS_CENTRAL

## 2011-05-12 MED ORDER — NEPRO/CARBSTEADY PO LIQD: 237.0000 mL | ORAL | Status: DC | PRN

## 2011-05-12 NOTE — Progress Notes (Signed)
Patient ID: [name], male   DOB: Jul 11, 1955, 56 y.o.   MRN: [id number] Pt.  Still SOB. Some bilateral rales.  Dialysis today. See orders.

## 2011-05-12 NOTE — Progress Notes (Signed)
Pt with history of DM.  Takes insulin at home.  Current A1C 9.9% (05/10/11).  No consistent CBGs so far this admission.  MD- Please clarify order for 70/30 insulin!  Currently ordered as a range.   Per home med rec, pt takes 70/30 insulin: 35 units in the morning with breakfast & 25 units with supper  1. Please clarify 70/30 order 2. Please add Novolog Sensitive correction (SSI) tid ac + HS.  Will follow.

## 2011-05-12 NOTE — Progress Notes (Signed)
Subjective: Patient seen and examined this am. informs his SOB to be better but still symptomatic when attempting to walk  Objective:  Vital signs in last 24 hours:  Filed Vitals:   05/11/11 2134 05/12/11 0147 05/12/11 0642 05/12/11 1300  BP: 141/76 150/87 160/87 191/93  Pulse: 63 64 68 70  Temp: 98.3 F (36.8 C) 98.2 F (36.8 C) 97.9 F (36.6 C) 97.6 F (36.4 C)  TempSrc: Oral Oral Oral   Resp: 18 18 19 18   Height:      Weight:   84.4 kg (186 lb 1.1 oz)   SpO2: 100% 100% 100% 100%    Intake/Output from previous day:   Intake/Output Summary (Last 24 hours) at 05/12/11 1647 Last data filed at 05/12/11 1300  Gross per 24 hour  Intake   1020 ml  Output    100 ml  Net    920 ml    Physical Exam:  General: middle aged male in no acute distress. HEENT: no pallor, no icterus, moist oral mucosa, no JVD, no lymphadenopathy Heart: Normal  s1 &s2  Regular rate and rhythm, without murmurs, rubs, gallops. Lungs: Clear to auscultation bilaterally. Abdomen: Soft, nontender, nondistended, positive bowel sounds. Extremities: No clubbing cyanosis or edema with positive pedal pulses. Left UE AV graft Neuro: Alert, awake, oriented x3, nonfocal.   Lab Results:  Basic Metabolic Panel:    Component Value Date/Time   NA 136 05/12/2011 0539   K 3.6 05/12/2011 0539   CL 95* 05/12/2011 0539   CO2 27 05/12/2011 0539   BUN 34* 05/12/2011 0539   CREATININE 6.94* 05/12/2011 0539   GLUCOSE 82 05/12/2011 0539   CALCIUM 10.1 05/12/2011 0539   CBC:    Component Value Date/Time   WBC 8.2 05/12/2011 0539   HGB 10.9* 05/12/2011 0539   HCT 33.7* 05/12/2011 0539   PLT 292 05/12/2011 0539   MCV 87.5 05/12/2011 0539   NEUTROABS 4.2 05/11/2011 0644   LYMPHSABS 1.8 05/11/2011 0644   MONOABS 0.4 05/11/2011 0644   EOSABS 0.3 05/11/2011 0644   BASOSABS 0.0 05/11/2011 0644    No results found for this or any previous visit (from the past 240 hour(s)).  Studies/Results: X-ray Chest Pa And Lateral   05/12/2011  *RADIOLOGY  REPORT*  Clinical Data: Shortness of breath.  Cough.  Hypertension. Diabetes.  End-stage renal disease, on dialysis.  CHEST - 2 VIEW  Comparison: 05/10/2011  Findings: Improved perihilar opacities noted moderate cardiomegaly persists along with tortuosity of the thoracic aorta.  Thoracic spondylosis is noted.  Minimal residual band-like opacity in the right lower lobe is present along with slight blunting of the right costophrenic angle suggesting a trace right pleural effusion.  IMPRESSION:  1.  Improved perihilar interstitial accentuation, with only very faint right lower lobe perihilar density. 2.  Trace right pleural effusion. 3.  Mild cardiomegaly. 4.  Thoracic spondylosis.  Original Report Authenticated By: Carron Curie, M.D.    Medications: Scheduled Meds:   . amLODipine  10 mg Oral Daily  . calcium acetate  2,001 mg Oral TID WC  . carvedilol  25 mg Oral BID WC  . cloNIDine  0.3 mg Transdermal Weekly  . cloNIDine  0.3 mg Oral TID  . heparin  5,000 Units Subcutaneous Q8H  . insulin aspart protamine-insulin aspart  25-35 Units Subcutaneous BID WC  . losartan  50 mg Oral BID  . multivitamin  1 tablet Oral Daily   Continuous Infusions:  PRN Meds:.sodium chloride, sodium chloride, alteplase,  feeding supplement (NEPRO CARB STEADY), heparin, heparin, heparin, hydrALAZINE, HYDROcodone-acetaminophen, lidocaine, lidocaine-prilocaine, pentafluoroprop-tetrafluoroeth  Assessment 69 male with ESRD on HD, DM type 2 , HT presented with acute onset SOB in the setting of pulm edema with volume overload, now improving with HD.  Plan: 1. Pulmonary edema:   likely noncardiogenic pulmonary edema secondary to fluid overload from end-stage renal disease.  Patient getting HD and with improvement his symptoms slowly. Discussed with Dr frazier, plan on HD again today. Will possibly be d/ced tomorrow if symptoms improved.  2. Uncontrolled hypertension:  This is likely also due to volume overload.  Improving slowly with HD  Patient is on multiple medications at home. Cardizem switched to amlod as HR on lower side hydrlazine prn   3. ESRD: On hemodialysis per Dr. Mare Ferrari.   4. DM 2: Uncontrolled with hemoglobin A1c of 9.9. On insulin aspart bid with meals   Full code     LOS: 2 days   [name] 05/12/2011, 4:47 PM

## 2011-05-12 NOTE — Progress Notes (Signed)
  Echocardiogram 2D Echocardiogram has been performed.  [name] 05/12/2011, 9:34 AM

## 2011-05-12 NOTE — Progress Notes (Signed)
Utilization review completed.  

## 2011-05-13 DIAGNOSIS — I10 Essential (primary) hypertension: Secondary | ICD-10-CM | POA: Diagnosis present

## 2011-05-13 LAB — GLUCOSE, CAPILLARY
Glucose-Capillary: 119 mg/dL — ABNORMAL HIGH (ref 70–99)
Glucose-Capillary: 195 mg/dL — ABNORMAL HIGH (ref 70–99)

## 2011-05-13 MED ORDER — AMLODIPINE BESYLATE 10 MG PO TABS: 10.0000 mg | ORAL_TABLET | Freq: Every day | ORAL | Status: DC

## 2011-05-13 MED ORDER — INSULIN ASPART PROT & ASPART (70-30 MIX) 100 UNIT/ML ~~LOC~~ SUSP: 35.0000 [IU] | Freq: Every day | SUBCUTANEOUS | Status: DC

## 2011-05-13 MED ORDER — INSULIN ASPART PROT & ASPART (70-30 MIX) 100 UNIT/ML ~~LOC~~ SUSP: 25.0000 [IU] | Freq: Every day | SUBCUTANEOUS | Status: DC

## 2011-05-13 MED ORDER — NEPRO/CARBSTEADY PO LIQD: 237.0000 mL | ORAL | Status: DC | PRN

## 2011-05-13 MED ORDER — HYDRALAZINE HCL 25 MG PO TABS: 10.0000 mg | ORAL_TABLET | Freq: Three times a day (TID) | ORAL | Status: DC

## 2011-05-13 MED ORDER — LOSARTAN POTASSIUM 50 MG PO TABS
100.0000 mg | ORAL_TABLET | Freq: Two times a day (BID) | ORAL | Status: DC
Start: 2011-05-13 — End: 2011-05-13
  Administered 2011-05-13: 100 mg via ORAL
  Filled 2011-05-13 (×2): qty 2

## 2011-05-13 MED ORDER — LOSARTAN POTASSIUM 50 MG PO TABS: 100.0000 mg | ORAL_TABLET | Freq: Two times a day (BID) | ORAL | Status: DC

## 2011-05-13 NOTE — Progress Notes (Signed)
05/13/11 Nursing 1309 DC IV, DC Tele, DC Home. Discharge instructions and home medications discussed with patient. Patient denies any questions or concerns at this time. Patient leaving unit ambulatory and appears in no acute distress. Tonny Branch, RN

## 2011-05-13 NOTE — Plan of Care (Signed)
Problem: Phase I Progression Outcomes Goal: EF % per last Echo/documented,Core Reminder form on chart Outcome: Completed/Met Date Met:  05/13/11 EF 55-60% per ECHO performed on 05/12/11.

## 2011-05-13 NOTE — Progress Notes (Signed)
   CARE MANAGEMENT NOTE 05/13/2011  Patient:  [name], [name]   Account Number:  [id number]  Date Initiated:  05/12/2011  Documentation initiated by:  Marvetta Gibbons  Subjective/Objective Assessment:   Pt admitted with pulmonary edema, hx of HD     Action/Plan:   PTA pt lived at home with family, was independent with ADLs   Anticipated DC Date:  05/13/2011   Anticipated DC Plan:  [place]  CM consult      Choice offered to / List presented to:             Status of service:  Completed, signed off Medicare Important Message given?   (If response is "NO", the following Medicare IM given date fields will be blank) Date Medicare IM given:   Date Additional Medicare IM given:    Discharge Disposition:  HOME/SELF CARE  Per UR Regulation:    Comments:  PCP- Mare Ferrari  05/13/11 15:31 Tomi Bamberger RN, BSN [phone] PTA independent, patient for dc today, no needs identified.

## 2011-05-13 NOTE — Discharge Summary (Signed)
Patient ID: [name] MRN: [id number] DOB/AGE: 56-Feb-1957 56 y.o.  Admit date: 05/10/2011 Discharge date: 05/13/2011  Primary Care Physician:  Melburn Hake, MD, MD  Discharge Diagnoses:    Present on Admission:  acute Pulmonary edema .Uncontrolled hypertension .DM (diabetes mellitus), type 2, uncontrolled .ESRD on dialysis    Current Discharge Medication List    START taking these medications   Details  amLODipine (NORVASC) 10 MG tablet Take 1 tablet (10 mg total) by mouth daily. Qty: 30 tablet, Refills: 0    hydrALAZINE (APRESOLINE) 25 MG tablet Take 0.5 tablets (12.5 mg total) by mouth 3 (three) times daily. Qty: 90 tablet, Refills: 2    Nutritional Supplements (FEEDING SUPPLEMENT, NEPRO CARB STEADY,) LIQD Take 237 mLs by mouth as needed (missed meal during dialysis.). Qty: 30 Can, Refills: 2      CONTINUE these medications which have CHANGED   Details  losartan (COZAAR) 50 MG tablet Take 2 tablets (100 mg total) by mouth 2 (two) times daily. Qty: 60 tablet, Refills: 3      CONTINUE these medications which have NOT CHANGED   Details  calcium acetate (PHOSLO) 667 MG capsule Take 2,001 mg by mouth 3 (three) times daily with meals.      carvedilol (COREG) 25 MG tablet Take 25 mg by mouth 2 (two) times daily with a meal.      cloNIDine (CATAPRES) 0.3 MG tablet Take 0.3 mg by mouth 3 (three) times daily.      insulin aspart protamine-insulin aspart (NOVOLOG 70/30) (70-30) 100 UNIT/ML injection Inject 25-35 Units into the skin 2 (two) times daily with a meal. Injects 35 units every morning and injects 25 units every evening    multivitamin (RENA-VIT) TABS tablet Take 1 tablet by mouth daily.      cloNIDine (CATAPRES - DOSED IN MG/24 HR) 0.3 mg/24hr Place 1 patch onto the skin once a week.        STOP taking these medications     diltiazem (CARDIZEM) 60 MG tablet         Disposition and Follow-up:  Home with outpatient follow up  Consults:   Cornelia Copa  ( renal)  Significant Diagnostic Studies:  Dg Chest 2 View  05/10/2011  *RADIOLOGY REPORT*  Clinical Data: Shortness of breath  CHEST - 2 VIEW  Comparison: 04/11/2011  Findings: Cardiomegaly again noted.  Central mild vascular congestion and mild perihilar interstitial prominence suspicious for mild interstitial edema.  Question small right pleural effusion.  No focal infiltrate.  IMPRESSION: Central mild vascular congestion and mild perihilar interstitial prominence suspicious for mild interstitial edema.  Question small right pleural effusion.  Original Report Authenticated By: Lahoma Crocker, M.D.    Brief H and P: For complete details please refer to admission H and P, but in brief [name] is a 56 y.o. male with medical history of any seasonal disease on hemodialysis. Patient also have hypertension and diabetes mellitus. Patient came into the hospital complaining about shortness of breath. Patient getting his dialysis on Monday, Wednesday and Friday. He said last time he got his dialysis on Monday and it took about 2 and half liters out of is system. He started to have shortness of breath yesterday. Patient denies any fever any chills. Denies any cough or sputum production, denies any chest pain palpitations and denies any wheezing. Upon initial evaluation in the emergency department chest x-ray showed mild pulmonary edema. Patient admitted to the hospital for further evaluation   Physical Exam on  Discharge:  Filed Vitals:   05/13/11 0402 05/13/11 0612 05/13/11 0856 05/13/11 1109  BP: 189/102 158/89 152/88 164/91  Pulse: 82 75 78   Temp: 99.4 F (37.4 C)     TempSrc: Oral     Resp: 18     Height:      Weight: 85.594 kg (188 lb 11.2 oz)     SpO2: 100%        Intake/Output Summary (Last 24 hours) at 05/13/11 1150 Last data filed at 05/13/11 1115  Gross per 24 hour  Intake    843 ml  Output   3001 ml  Net  -2158 ml    General: middle aged male in no acute distress.  HEENT: no  pallor, no icterus, moist oral mucosa, no JVD, no lymphadenopathy  Heart: Normal s1 &s2 Regular rate and rhythm, without murmurs, rubs, gallops.  Lungs: Clear to auscultation bilaterally.  Abdomen: Soft, nontender, nondistended, positive bowel sounds.  Extremities: No clubbing cyanosis or edema with positive pedal pulses. Left UE AV graft  Neuro: Alert, awake, oriented x3, nonfocal.  CBC:    Component Value Date/Time   WBC 8.2 05/12/2011 0539   HGB 10.9* 05/12/2011 0539   HCT 33.7* 05/12/2011 0539   PLT 292 05/12/2011 0539   MCV 87.5 05/12/2011 0539   NEUTROABS 4.2 05/11/2011 0644   LYMPHSABS 1.8 05/11/2011 0644   MONOABS 0.4 05/11/2011 0644   EOSABS 0.3 05/11/2011 0644   BASOSABS 0.0 05/11/2011 [id number]    Basic Metabolic Panel:    Component Value Date/Time   NA 136 05/12/2011 0539   K 3.6 05/12/2011 0539   CL 95* 05/12/2011 0539   CO2 27 05/12/2011 0539   BUN 34* 05/12/2011 0539   CREATININE 6.94* 05/12/2011 0539   GLUCOSE 82 05/12/2011 0539   CALCIUM 10.1 05/12/2011 0539    Hospital Course:   1. Pulmonary edema:  likely noncardiogenic pulmonary edema secondary to fluid overload from end-stage renal disease and missed out his scheduled HD.  Patient received HD and with improvement his symptoms slowly and is now stable this am. His o2 sat remains stable on RA and ambulation.  2. Uncontrolled hypertension:  This is likely also due to volume overload. Improving slowly with HD  Patient is on multiple medications at home. Cardizem switched to amlod as HR on lower side  i have increased his losartan dose to 100 mg bid and added low dose hydralazine as well.  3. ESRD: On hemodialysis per Dr. Mare Ferrari.   4. DM 2: Uncontrolled with hemoglobin A1c of 9.9. On insulin  bid with meals which he will continue.  Patient clinically stale and will follow up with Dr frazer as outpatient    Time spent on Discharge: 45 minutes  Signed: Louellen Molder 05/13/2011, 11:50 AM

## 2011-05-14 DIAGNOSIS — N186 End stage renal disease: Secondary | ICD-10-CM | POA: Diagnosis not present

## 2011-05-14 DIAGNOSIS — N2581 Secondary hyperparathyroidism of renal origin: Secondary | ICD-10-CM | POA: Diagnosis not present

## 2011-05-14 DIAGNOSIS — Z992 Dependence on renal dialysis: Secondary | ICD-10-CM | POA: Diagnosis not present

## 2011-05-14 DIAGNOSIS — E119 Type 2 diabetes mellitus without complications: Secondary | ICD-10-CM | POA: Diagnosis not present

## 2011-05-14 DIAGNOSIS — D509 Iron deficiency anemia, unspecified: Secondary | ICD-10-CM | POA: Diagnosis not present

## 2011-05-14 DIAGNOSIS — D631 Anemia in chronic kidney disease: Secondary | ICD-10-CM | POA: Diagnosis not present

## 2011-05-17 DIAGNOSIS — Z992 Dependence on renal dialysis: Secondary | ICD-10-CM | POA: Diagnosis not present

## 2011-05-17 DIAGNOSIS — N186 End stage renal disease: Secondary | ICD-10-CM | POA: Diagnosis not present

## 2011-05-17 DIAGNOSIS — E119 Type 2 diabetes mellitus without complications: Secondary | ICD-10-CM | POA: Diagnosis not present

## 2011-05-17 DIAGNOSIS — D631 Anemia in chronic kidney disease: Secondary | ICD-10-CM | POA: Diagnosis not present

## 2011-05-17 DIAGNOSIS — N2581 Secondary hyperparathyroidism of renal origin: Secondary | ICD-10-CM | POA: Diagnosis not present

## 2011-05-17 DIAGNOSIS — D509 Iron deficiency anemia, unspecified: Secondary | ICD-10-CM | POA: Diagnosis not present

## 2011-05-19 DIAGNOSIS — N186 End stage renal disease: Secondary | ICD-10-CM | POA: Diagnosis not present

## 2011-05-19 DIAGNOSIS — D509 Iron deficiency anemia, unspecified: Secondary | ICD-10-CM | POA: Diagnosis not present

## 2011-05-19 DIAGNOSIS — E119 Type 2 diabetes mellitus without complications: Secondary | ICD-10-CM | POA: Diagnosis not present

## 2011-05-19 DIAGNOSIS — Z992 Dependence on renal dialysis: Secondary | ICD-10-CM | POA: Diagnosis not present

## 2011-05-19 DIAGNOSIS — D631 Anemia in chronic kidney disease: Secondary | ICD-10-CM | POA: Diagnosis not present

## 2011-05-19 DIAGNOSIS — N2581 Secondary hyperparathyroidism of renal origin: Secondary | ICD-10-CM | POA: Diagnosis not present

## 2011-05-21 DIAGNOSIS — N2581 Secondary hyperparathyroidism of renal origin: Secondary | ICD-10-CM | POA: Diagnosis not present

## 2011-05-21 DIAGNOSIS — D631 Anemia in chronic kidney disease: Secondary | ICD-10-CM | POA: Diagnosis not present

## 2011-05-21 DIAGNOSIS — E119 Type 2 diabetes mellitus without complications: Secondary | ICD-10-CM | POA: Diagnosis not present

## 2011-05-21 DIAGNOSIS — N186 End stage renal disease: Secondary | ICD-10-CM | POA: Diagnosis not present

## 2011-05-21 DIAGNOSIS — D509 Iron deficiency anemia, unspecified: Secondary | ICD-10-CM | POA: Diagnosis not present

## 2011-05-21 DIAGNOSIS — Z992 Dependence on renal dialysis: Secondary | ICD-10-CM | POA: Diagnosis not present

## 2011-05-24 DIAGNOSIS — E119 Type 2 diabetes mellitus without complications: Secondary | ICD-10-CM | POA: Diagnosis not present

## 2011-05-24 DIAGNOSIS — D509 Iron deficiency anemia, unspecified: Secondary | ICD-10-CM | POA: Diagnosis not present

## 2011-05-24 DIAGNOSIS — N2581 Secondary hyperparathyroidism of renal origin: Secondary | ICD-10-CM | POA: Diagnosis not present

## 2011-05-24 DIAGNOSIS — D631 Anemia in chronic kidney disease: Secondary | ICD-10-CM | POA: Diagnosis not present

## 2011-05-24 DIAGNOSIS — N186 End stage renal disease: Secondary | ICD-10-CM | POA: Diagnosis not present

## 2011-05-24 DIAGNOSIS — Z992 Dependence on renal dialysis: Secondary | ICD-10-CM | POA: Diagnosis not present

## 2011-05-26 DIAGNOSIS — D509 Iron deficiency anemia, unspecified: Secondary | ICD-10-CM | POA: Diagnosis not present

## 2011-05-26 DIAGNOSIS — E119 Type 2 diabetes mellitus without complications: Secondary | ICD-10-CM | POA: Diagnosis not present

## 2011-05-26 DIAGNOSIS — Z992 Dependence on renal dialysis: Secondary | ICD-10-CM | POA: Diagnosis not present

## 2011-05-26 DIAGNOSIS — N186 End stage renal disease: Secondary | ICD-10-CM | POA: Diagnosis not present

## 2011-05-26 DIAGNOSIS — D631 Anemia in chronic kidney disease: Secondary | ICD-10-CM | POA: Diagnosis not present

## 2011-05-26 DIAGNOSIS — N2581 Secondary hyperparathyroidism of renal origin: Secondary | ICD-10-CM | POA: Diagnosis not present

## 2011-05-28 DIAGNOSIS — D509 Iron deficiency anemia, unspecified: Secondary | ICD-10-CM | POA: Diagnosis not present

## 2011-05-28 DIAGNOSIS — E119 Type 2 diabetes mellitus without complications: Secondary | ICD-10-CM | POA: Diagnosis not present

## 2011-05-28 DIAGNOSIS — D631 Anemia in chronic kidney disease: Secondary | ICD-10-CM | POA: Diagnosis not present

## 2011-05-28 DIAGNOSIS — Z992 Dependence on renal dialysis: Secondary | ICD-10-CM | POA: Diagnosis not present

## 2011-05-28 DIAGNOSIS — N186 End stage renal disease: Secondary | ICD-10-CM | POA: Diagnosis not present

## 2011-05-28 DIAGNOSIS — N2581 Secondary hyperparathyroidism of renal origin: Secondary | ICD-10-CM | POA: Diagnosis not present

## 2011-05-31 DIAGNOSIS — N186 End stage renal disease: Secondary | ICD-10-CM | POA: Diagnosis not present

## 2011-05-31 DIAGNOSIS — D509 Iron deficiency anemia, unspecified: Secondary | ICD-10-CM | POA: Diagnosis not present

## 2011-05-31 DIAGNOSIS — D631 Anemia in chronic kidney disease: Secondary | ICD-10-CM | POA: Diagnosis not present

## 2011-05-31 DIAGNOSIS — N2581 Secondary hyperparathyroidism of renal origin: Secondary | ICD-10-CM | POA: Diagnosis not present

## 2011-05-31 DIAGNOSIS — Z992 Dependence on renal dialysis: Secondary | ICD-10-CM | POA: Diagnosis not present

## 2011-05-31 DIAGNOSIS — E119 Type 2 diabetes mellitus without complications: Secondary | ICD-10-CM | POA: Diagnosis not present

## 2011-06-02 DIAGNOSIS — D509 Iron deficiency anemia, unspecified: Secondary | ICD-10-CM | POA: Diagnosis not present

## 2011-06-02 DIAGNOSIS — N2581 Secondary hyperparathyroidism of renal origin: Secondary | ICD-10-CM | POA: Diagnosis not present

## 2011-06-02 DIAGNOSIS — Z992 Dependence on renal dialysis: Secondary | ICD-10-CM | POA: Diagnosis not present

## 2011-06-02 DIAGNOSIS — E1129 Type 2 diabetes mellitus with other diabetic kidney complication: Secondary | ICD-10-CM | POA: Diagnosis not present

## 2011-06-02 DIAGNOSIS — N186 End stage renal disease: Secondary | ICD-10-CM | POA: Diagnosis not present

## 2011-06-02 DIAGNOSIS — D631 Anemia in chronic kidney disease: Secondary | ICD-10-CM | POA: Diagnosis not present

## 2011-06-02 DIAGNOSIS — E119 Type 2 diabetes mellitus without complications: Secondary | ICD-10-CM | POA: Diagnosis not present

## 2011-06-04 DIAGNOSIS — Z992 Dependence on renal dialysis: Secondary | ICD-10-CM | POA: Diagnosis not present

## 2011-06-04 DIAGNOSIS — D509 Iron deficiency anemia, unspecified: Secondary | ICD-10-CM | POA: Diagnosis not present

## 2011-06-04 DIAGNOSIS — D631 Anemia in chronic kidney disease: Secondary | ICD-10-CM | POA: Diagnosis not present

## 2011-06-04 DIAGNOSIS — N186 End stage renal disease: Secondary | ICD-10-CM | POA: Diagnosis not present

## 2011-06-04 DIAGNOSIS — N2581 Secondary hyperparathyroidism of renal origin: Secondary | ICD-10-CM | POA: Diagnosis not present

## 2011-06-04 DIAGNOSIS — E119 Type 2 diabetes mellitus without complications: Secondary | ICD-10-CM | POA: Diagnosis not present

## 2011-06-07 DIAGNOSIS — N186 End stage renal disease: Secondary | ICD-10-CM | POA: Diagnosis not present

## 2011-06-07 DIAGNOSIS — E119 Type 2 diabetes mellitus without complications: Secondary | ICD-10-CM | POA: Diagnosis not present

## 2011-06-07 DIAGNOSIS — N2581 Secondary hyperparathyroidism of renal origin: Secondary | ICD-10-CM | POA: Diagnosis not present

## 2011-06-07 DIAGNOSIS — D509 Iron deficiency anemia, unspecified: Secondary | ICD-10-CM | POA: Diagnosis not present

## 2011-06-07 DIAGNOSIS — D631 Anemia in chronic kidney disease: Secondary | ICD-10-CM | POA: Diagnosis not present

## 2011-06-07 DIAGNOSIS — Z992 Dependence on renal dialysis: Secondary | ICD-10-CM | POA: Diagnosis not present

## 2011-06-09 DIAGNOSIS — Z992 Dependence on renal dialysis: Secondary | ICD-10-CM | POA: Diagnosis not present

## 2011-06-09 DIAGNOSIS — D509 Iron deficiency anemia, unspecified: Secondary | ICD-10-CM | POA: Diagnosis not present

## 2011-06-09 DIAGNOSIS — E119 Type 2 diabetes mellitus without complications: Secondary | ICD-10-CM | POA: Diagnosis not present

## 2011-06-09 DIAGNOSIS — D631 Anemia in chronic kidney disease: Secondary | ICD-10-CM | POA: Diagnosis not present

## 2011-06-09 DIAGNOSIS — N186 End stage renal disease: Secondary | ICD-10-CM | POA: Diagnosis not present

## 2011-06-09 DIAGNOSIS — N2581 Secondary hyperparathyroidism of renal origin: Secondary | ICD-10-CM | POA: Diagnosis not present

## 2011-06-11 DIAGNOSIS — D631 Anemia in chronic kidney disease: Secondary | ICD-10-CM | POA: Diagnosis not present

## 2011-06-11 DIAGNOSIS — N2581 Secondary hyperparathyroidism of renal origin: Secondary | ICD-10-CM | POA: Diagnosis not present

## 2011-06-11 DIAGNOSIS — Z23 Encounter for immunization: Secondary | ICD-10-CM | POA: Diagnosis not present

## 2011-06-11 DIAGNOSIS — N186 End stage renal disease: Secondary | ICD-10-CM | POA: Diagnosis not present

## 2011-06-11 DIAGNOSIS — E119 Type 2 diabetes mellitus without complications: Secondary | ICD-10-CM | POA: Diagnosis not present

## 2011-06-14 DIAGNOSIS — N186 End stage renal disease: Secondary | ICD-10-CM | POA: Diagnosis not present

## 2011-06-14 DIAGNOSIS — N2581 Secondary hyperparathyroidism of renal origin: Secondary | ICD-10-CM | POA: Diagnosis not present

## 2011-06-14 DIAGNOSIS — Z23 Encounter for immunization: Secondary | ICD-10-CM | POA: Diagnosis not present

## 2011-06-14 DIAGNOSIS — D631 Anemia in chronic kidney disease: Secondary | ICD-10-CM | POA: Diagnosis not present

## 2011-06-14 DIAGNOSIS — E119 Type 2 diabetes mellitus without complications: Secondary | ICD-10-CM | POA: Diagnosis not present

## 2011-06-16 DIAGNOSIS — N186 End stage renal disease: Secondary | ICD-10-CM | POA: Diagnosis not present

## 2011-06-16 DIAGNOSIS — E119 Type 2 diabetes mellitus without complications: Secondary | ICD-10-CM | POA: Diagnosis not present

## 2011-06-16 DIAGNOSIS — N2581 Secondary hyperparathyroidism of renal origin: Secondary | ICD-10-CM | POA: Diagnosis not present

## 2011-06-16 DIAGNOSIS — Z23 Encounter for immunization: Secondary | ICD-10-CM | POA: Diagnosis not present

## 2011-06-16 DIAGNOSIS — D631 Anemia in chronic kidney disease: Secondary | ICD-10-CM | POA: Diagnosis not present

## 2011-06-18 DIAGNOSIS — D631 Anemia in chronic kidney disease: Secondary | ICD-10-CM | POA: Diagnosis not present

## 2011-06-18 DIAGNOSIS — Z23 Encounter for immunization: Secondary | ICD-10-CM | POA: Diagnosis not present

## 2011-06-18 DIAGNOSIS — N2581 Secondary hyperparathyroidism of renal origin: Secondary | ICD-10-CM | POA: Diagnosis not present

## 2011-06-18 DIAGNOSIS — N186 End stage renal disease: Secondary | ICD-10-CM | POA: Diagnosis not present

## 2011-06-18 DIAGNOSIS — E119 Type 2 diabetes mellitus without complications: Secondary | ICD-10-CM | POA: Diagnosis not present

## 2011-06-21 DIAGNOSIS — N186 End stage renal disease: Secondary | ICD-10-CM | POA: Diagnosis not present

## 2011-06-21 DIAGNOSIS — Z23 Encounter for immunization: Secondary | ICD-10-CM | POA: Diagnosis not present

## 2011-06-21 DIAGNOSIS — E119 Type 2 diabetes mellitus without complications: Secondary | ICD-10-CM | POA: Diagnosis not present

## 2011-06-21 DIAGNOSIS — N2581 Secondary hyperparathyroidism of renal origin: Secondary | ICD-10-CM | POA: Diagnosis not present

## 2011-06-21 DIAGNOSIS — D631 Anemia in chronic kidney disease: Secondary | ICD-10-CM | POA: Diagnosis not present

## 2011-06-23 DIAGNOSIS — N186 End stage renal disease: Secondary | ICD-10-CM | POA: Diagnosis not present

## 2011-06-23 DIAGNOSIS — N2581 Secondary hyperparathyroidism of renal origin: Secondary | ICD-10-CM | POA: Diagnosis not present

## 2011-06-23 DIAGNOSIS — D631 Anemia in chronic kidney disease: Secondary | ICD-10-CM | POA: Diagnosis not present

## 2011-06-23 DIAGNOSIS — E119 Type 2 diabetes mellitus without complications: Secondary | ICD-10-CM | POA: Diagnosis not present

## 2011-06-23 DIAGNOSIS — Z23 Encounter for immunization: Secondary | ICD-10-CM | POA: Diagnosis not present

## 2011-06-25 DIAGNOSIS — Z23 Encounter for immunization: Secondary | ICD-10-CM | POA: Diagnosis not present

## 2011-06-25 DIAGNOSIS — D631 Anemia in chronic kidney disease: Secondary | ICD-10-CM | POA: Diagnosis not present

## 2011-06-25 DIAGNOSIS — E119 Type 2 diabetes mellitus without complications: Secondary | ICD-10-CM | POA: Diagnosis not present

## 2011-06-25 DIAGNOSIS — N186 End stage renal disease: Secondary | ICD-10-CM | POA: Diagnosis not present

## 2011-06-25 DIAGNOSIS — N2581 Secondary hyperparathyroidism of renal origin: Secondary | ICD-10-CM | POA: Diagnosis not present

## 2011-06-28 DIAGNOSIS — E119 Type 2 diabetes mellitus without complications: Secondary | ICD-10-CM | POA: Diagnosis not present

## 2011-06-28 DIAGNOSIS — Z23 Encounter for immunization: Secondary | ICD-10-CM | POA: Diagnosis not present

## 2011-06-28 DIAGNOSIS — N186 End stage renal disease: Secondary | ICD-10-CM | POA: Diagnosis not present

## 2011-06-28 DIAGNOSIS — N2581 Secondary hyperparathyroidism of renal origin: Secondary | ICD-10-CM | POA: Diagnosis not present

## 2011-06-28 DIAGNOSIS — D631 Anemia in chronic kidney disease: Secondary | ICD-10-CM | POA: Diagnosis not present

## 2011-06-30 DIAGNOSIS — N2581 Secondary hyperparathyroidism of renal origin: Secondary | ICD-10-CM | POA: Diagnosis not present

## 2011-06-30 DIAGNOSIS — D631 Anemia in chronic kidney disease: Secondary | ICD-10-CM | POA: Diagnosis not present

## 2011-06-30 DIAGNOSIS — N186 End stage renal disease: Secondary | ICD-10-CM | POA: Diagnosis not present

## 2011-06-30 DIAGNOSIS — E119 Type 2 diabetes mellitus without complications: Secondary | ICD-10-CM | POA: Diagnosis not present

## 2011-06-30 DIAGNOSIS — Z23 Encounter for immunization: Secondary | ICD-10-CM | POA: Diagnosis not present

## 2011-07-02 DIAGNOSIS — N2581 Secondary hyperparathyroidism of renal origin: Secondary | ICD-10-CM | POA: Diagnosis not present

## 2011-07-02 DIAGNOSIS — D631 Anemia in chronic kidney disease: Secondary | ICD-10-CM | POA: Diagnosis not present

## 2011-07-02 DIAGNOSIS — E119 Type 2 diabetes mellitus without complications: Secondary | ICD-10-CM | POA: Diagnosis not present

## 2011-07-02 DIAGNOSIS — Z23 Encounter for immunization: Secondary | ICD-10-CM | POA: Diagnosis not present

## 2011-07-02 DIAGNOSIS — N186 End stage renal disease: Secondary | ICD-10-CM | POA: Diagnosis not present

## 2011-07-05 DIAGNOSIS — D631 Anemia in chronic kidney disease: Secondary | ICD-10-CM | POA: Diagnosis not present

## 2011-07-05 DIAGNOSIS — Z23 Encounter for immunization: Secondary | ICD-10-CM | POA: Diagnosis not present

## 2011-07-05 DIAGNOSIS — E119 Type 2 diabetes mellitus without complications: Secondary | ICD-10-CM | POA: Diagnosis not present

## 2011-07-05 DIAGNOSIS — N2581 Secondary hyperparathyroidism of renal origin: Secondary | ICD-10-CM | POA: Diagnosis not present

## 2011-07-05 DIAGNOSIS — N186 End stage renal disease: Secondary | ICD-10-CM | POA: Diagnosis not present

## 2011-07-07 DIAGNOSIS — N2581 Secondary hyperparathyroidism of renal origin: Secondary | ICD-10-CM | POA: Diagnosis not present

## 2011-07-07 DIAGNOSIS — E119 Type 2 diabetes mellitus without complications: Secondary | ICD-10-CM | POA: Diagnosis not present

## 2011-07-07 DIAGNOSIS — N186 End stage renal disease: Secondary | ICD-10-CM | POA: Diagnosis not present

## 2011-07-07 DIAGNOSIS — Z23 Encounter for immunization: Secondary | ICD-10-CM | POA: Diagnosis not present

## 2011-07-07 DIAGNOSIS — D509 Iron deficiency anemia, unspecified: Secondary | ICD-10-CM | POA: Diagnosis not present

## 2011-07-07 DIAGNOSIS — D631 Anemia in chronic kidney disease: Secondary | ICD-10-CM | POA: Diagnosis not present

## 2011-07-07 DIAGNOSIS — Z992 Dependence on renal dialysis: Secondary | ICD-10-CM | POA: Diagnosis not present

## 2011-07-09 DIAGNOSIS — D631 Anemia in chronic kidney disease: Secondary | ICD-10-CM | POA: Diagnosis not present

## 2011-07-09 DIAGNOSIS — M25569 Pain in unspecified knee: Secondary | ICD-10-CM | POA: Diagnosis not present

## 2011-07-09 DIAGNOSIS — N2581 Secondary hyperparathyroidism of renal origin: Secondary | ICD-10-CM | POA: Diagnosis not present

## 2011-07-09 DIAGNOSIS — N186 End stage renal disease: Secondary | ICD-10-CM | POA: Diagnosis not present

## 2011-07-09 DIAGNOSIS — Z23 Encounter for immunization: Secondary | ICD-10-CM | POA: Diagnosis not present

## 2011-07-09 DIAGNOSIS — Z992 Dependence on renal dialysis: Secondary | ICD-10-CM | POA: Diagnosis not present

## 2011-07-09 DIAGNOSIS — E119 Type 2 diabetes mellitus without complications: Secondary | ICD-10-CM | POA: Diagnosis not present

## 2011-07-09 DIAGNOSIS — D509 Iron deficiency anemia, unspecified: Secondary | ICD-10-CM | POA: Diagnosis not present

## 2011-07-09 DIAGNOSIS — I10 Essential (primary) hypertension: Secondary | ICD-10-CM | POA: Diagnosis not present

## 2011-07-09 DIAGNOSIS — IMO0001 Reserved for inherently not codable concepts without codable children: Secondary | ICD-10-CM | POA: Diagnosis not present

## 2011-08-09 DIAGNOSIS — N186 End stage renal disease: Secondary | ICD-10-CM | POA: Diagnosis not present

## 2011-08-09 DIAGNOSIS — Z992 Dependence on renal dialysis: Secondary | ICD-10-CM | POA: Diagnosis not present

## 2011-08-09 DIAGNOSIS — I10 Essential (primary) hypertension: Secondary | ICD-10-CM | POA: Diagnosis not present

## 2011-08-09 DIAGNOSIS — E119 Type 2 diabetes mellitus without complications: Secondary | ICD-10-CM | POA: Diagnosis not present

## 2011-08-09 DIAGNOSIS — M25569 Pain in unspecified knee: Secondary | ICD-10-CM | POA: Diagnosis not present

## 2011-08-09 DIAGNOSIS — IMO0001 Reserved for inherently not codable concepts without codable children: Secondary | ICD-10-CM | POA: Diagnosis not present

## 2011-08-09 DIAGNOSIS — D631 Anemia in chronic kidney disease: Secondary | ICD-10-CM | POA: Diagnosis not present

## 2011-08-09 DIAGNOSIS — Z23 Encounter for immunization: Secondary | ICD-10-CM | POA: Diagnosis not present

## 2011-08-09 DIAGNOSIS — D509 Iron deficiency anemia, unspecified: Secondary | ICD-10-CM | POA: Diagnosis not present

## 2011-08-09 DIAGNOSIS — N2581 Secondary hyperparathyroidism of renal origin: Secondary | ICD-10-CM | POA: Diagnosis not present

## 2011-09-01 DIAGNOSIS — E1129 Type 2 diabetes mellitus with other diabetic kidney complication: Secondary | ICD-10-CM | POA: Diagnosis not present

## 2011-09-08 DIAGNOSIS — D631 Anemia in chronic kidney disease: Secondary | ICD-10-CM | POA: Diagnosis not present

## 2011-09-08 DIAGNOSIS — IMO0001 Reserved for inherently not codable concepts without codable children: Secondary | ICD-10-CM | POA: Diagnosis not present

## 2011-09-08 DIAGNOSIS — Z992 Dependence on renal dialysis: Secondary | ICD-10-CM | POA: Diagnosis not present

## 2011-09-08 DIAGNOSIS — I10 Essential (primary) hypertension: Secondary | ICD-10-CM | POA: Diagnosis not present

## 2011-09-08 DIAGNOSIS — E119 Type 2 diabetes mellitus without complications: Secondary | ICD-10-CM | POA: Diagnosis not present

## 2011-09-08 DIAGNOSIS — M25569 Pain in unspecified knee: Secondary | ICD-10-CM | POA: Diagnosis not present

## 2011-09-08 DIAGNOSIS — D509 Iron deficiency anemia, unspecified: Secondary | ICD-10-CM | POA: Diagnosis not present

## 2011-09-08 DIAGNOSIS — N2581 Secondary hyperparathyroidism of renal origin: Secondary | ICD-10-CM | POA: Diagnosis not present

## 2011-09-08 DIAGNOSIS — R509 Fever, unspecified: Secondary | ICD-10-CM | POA: Diagnosis not present

## 2011-09-08 DIAGNOSIS — N186 End stage renal disease: Secondary | ICD-10-CM | POA: Diagnosis not present

## 2011-09-29 ENCOUNTER — Other Ambulatory Visit: Payer: Self-pay | Admitting: Nephrology

## 2011-09-29 ENCOUNTER — Ambulatory Visit
Admission: RE | Admit: 2011-09-29 | Discharge: 2011-09-29 | Disposition: A | Discharge status: Home / Self Care | Payer: Medicare Other | Source: Ambulatory Visit | Attending: Nephrology | Admitting: Nephrology

## 2011-09-29 DIAGNOSIS — R6883 Chills (without fever): Secondary | ICD-10-CM

## 2011-09-29 DIAGNOSIS — R059 Cough, unspecified: Secondary | ICD-10-CM | POA: Diagnosis not present

## 2011-09-29 DIAGNOSIS — R509 Fever, unspecified: Secondary | ICD-10-CM

## 2011-10-09 DIAGNOSIS — M25569 Pain in unspecified knee: Secondary | ICD-10-CM | POA: Diagnosis not present

## 2011-10-09 DIAGNOSIS — I10 Essential (primary) hypertension: Secondary | ICD-10-CM | POA: Diagnosis not present

## 2011-10-09 DIAGNOSIS — IMO0001 Reserved for inherently not codable concepts without codable children: Secondary | ICD-10-CM | POA: Diagnosis not present

## 2011-10-11 DIAGNOSIS — Z992 Dependence on renal dialysis: Secondary | ICD-10-CM | POA: Diagnosis not present

## 2011-10-11 DIAGNOSIS — N2581 Secondary hyperparathyroidism of renal origin: Secondary | ICD-10-CM | POA: Diagnosis not present

## 2011-10-11 DIAGNOSIS — E119 Type 2 diabetes mellitus without complications: Secondary | ICD-10-CM | POA: Diagnosis not present

## 2011-10-11 DIAGNOSIS — D509 Iron deficiency anemia, unspecified: Secondary | ICD-10-CM | POA: Diagnosis not present

## 2011-10-11 DIAGNOSIS — N186 End stage renal disease: Secondary | ICD-10-CM | POA: Diagnosis not present

## 2011-10-11 DIAGNOSIS — D631 Anemia in chronic kidney disease: Secondary | ICD-10-CM | POA: Diagnosis not present

## 2011-11-08 DIAGNOSIS — N186 End stage renal disease: Secondary | ICD-10-CM | POA: Diagnosis not present

## 2011-11-08 DIAGNOSIS — I10 Essential (primary) hypertension: Secondary | ICD-10-CM | POA: Diagnosis not present

## 2011-11-08 DIAGNOSIS — N2581 Secondary hyperparathyroidism of renal origin: Secondary | ICD-10-CM | POA: Diagnosis not present

## 2011-11-08 DIAGNOSIS — M25569 Pain in unspecified knee: Secondary | ICD-10-CM | POA: Diagnosis not present

## 2011-11-08 DIAGNOSIS — D631 Anemia in chronic kidney disease: Secondary | ICD-10-CM | POA: Diagnosis not present

## 2011-11-08 DIAGNOSIS — D509 Iron deficiency anemia, unspecified: Secondary | ICD-10-CM | POA: Diagnosis not present

## 2011-11-08 DIAGNOSIS — E119 Type 2 diabetes mellitus without complications: Secondary | ICD-10-CM | POA: Diagnosis not present

## 2011-11-08 DIAGNOSIS — Z992 Dependence on renal dialysis: Secondary | ICD-10-CM | POA: Diagnosis not present

## 2011-12-01 DIAGNOSIS — E1129 Type 2 diabetes mellitus with other diabetic kidney complication: Secondary | ICD-10-CM | POA: Diagnosis not present

## 2011-12-09 DIAGNOSIS — IMO0001 Reserved for inherently not codable concepts without codable children: Secondary | ICD-10-CM | POA: Diagnosis not present

## 2011-12-09 DIAGNOSIS — N186 End stage renal disease: Secondary | ICD-10-CM | POA: Diagnosis not present

## 2011-12-09 DIAGNOSIS — M25569 Pain in unspecified knee: Secondary | ICD-10-CM | POA: Diagnosis not present

## 2011-12-10 DIAGNOSIS — N186 End stage renal disease: Secondary | ICD-10-CM | POA: Diagnosis not present

## 2011-12-10 DIAGNOSIS — N2581 Secondary hyperparathyroidism of renal origin: Secondary | ICD-10-CM | POA: Diagnosis not present

## 2011-12-10 DIAGNOSIS — Z992 Dependence on renal dialysis: Secondary | ICD-10-CM | POA: Diagnosis not present

## 2011-12-10 DIAGNOSIS — Z23 Encounter for immunization: Secondary | ICD-10-CM | POA: Diagnosis not present

## 2011-12-10 DIAGNOSIS — D509 Iron deficiency anemia, unspecified: Secondary | ICD-10-CM | POA: Diagnosis not present

## 2011-12-10 DIAGNOSIS — E119 Type 2 diabetes mellitus without complications: Secondary | ICD-10-CM | POA: Diagnosis not present

## 2011-12-13 DIAGNOSIS — Z992 Dependence on renal dialysis: Secondary | ICD-10-CM | POA: Diagnosis not present

## 2011-12-13 DIAGNOSIS — E119 Type 2 diabetes mellitus without complications: Secondary | ICD-10-CM | POA: Diagnosis not present

## 2011-12-13 DIAGNOSIS — D509 Iron deficiency anemia, unspecified: Secondary | ICD-10-CM | POA: Diagnosis not present

## 2011-12-13 DIAGNOSIS — N2581 Secondary hyperparathyroidism of renal origin: Secondary | ICD-10-CM | POA: Diagnosis not present

## 2011-12-13 DIAGNOSIS — N186 End stage renal disease: Secondary | ICD-10-CM | POA: Diagnosis not present

## 2011-12-13 DIAGNOSIS — Z23 Encounter for immunization: Secondary | ICD-10-CM | POA: Diagnosis not present

## 2011-12-15 DIAGNOSIS — Z992 Dependence on renal dialysis: Secondary | ICD-10-CM | POA: Diagnosis not present

## 2011-12-15 DIAGNOSIS — Z23 Encounter for immunization: Secondary | ICD-10-CM | POA: Diagnosis not present

## 2011-12-15 DIAGNOSIS — E119 Type 2 diabetes mellitus without complications: Secondary | ICD-10-CM | POA: Diagnosis not present

## 2011-12-15 DIAGNOSIS — N186 End stage renal disease: Secondary | ICD-10-CM | POA: Diagnosis not present

## 2011-12-15 DIAGNOSIS — D509 Iron deficiency anemia, unspecified: Secondary | ICD-10-CM | POA: Diagnosis not present

## 2011-12-15 DIAGNOSIS — N2581 Secondary hyperparathyroidism of renal origin: Secondary | ICD-10-CM | POA: Diagnosis not present

## 2011-12-17 DIAGNOSIS — N2581 Secondary hyperparathyroidism of renal origin: Secondary | ICD-10-CM | POA: Diagnosis not present

## 2011-12-17 DIAGNOSIS — Z992 Dependence on renal dialysis: Secondary | ICD-10-CM | POA: Diagnosis not present

## 2011-12-17 DIAGNOSIS — D509 Iron deficiency anemia, unspecified: Secondary | ICD-10-CM | POA: Diagnosis not present

## 2011-12-17 DIAGNOSIS — Z23 Encounter for immunization: Secondary | ICD-10-CM | POA: Diagnosis not present

## 2011-12-17 DIAGNOSIS — E119 Type 2 diabetes mellitus without complications: Secondary | ICD-10-CM | POA: Diagnosis not present

## 2011-12-17 DIAGNOSIS — N186 End stage renal disease: Secondary | ICD-10-CM | POA: Diagnosis not present

## 2011-12-20 DIAGNOSIS — D509 Iron deficiency anemia, unspecified: Secondary | ICD-10-CM | POA: Diagnosis not present

## 2011-12-20 DIAGNOSIS — Z23 Encounter for immunization: Secondary | ICD-10-CM | POA: Diagnosis not present

## 2011-12-20 DIAGNOSIS — N186 End stage renal disease: Secondary | ICD-10-CM | POA: Diagnosis not present

## 2011-12-20 DIAGNOSIS — Z992 Dependence on renal dialysis: Secondary | ICD-10-CM | POA: Diagnosis not present

## 2011-12-20 DIAGNOSIS — N2581 Secondary hyperparathyroidism of renal origin: Secondary | ICD-10-CM | POA: Diagnosis not present

## 2011-12-20 DIAGNOSIS — E119 Type 2 diabetes mellitus without complications: Secondary | ICD-10-CM | POA: Diagnosis not present

## 2011-12-22 DIAGNOSIS — Z992 Dependence on renal dialysis: Secondary | ICD-10-CM | POA: Diagnosis not present

## 2011-12-22 DIAGNOSIS — N2581 Secondary hyperparathyroidism of renal origin: Secondary | ICD-10-CM | POA: Diagnosis not present

## 2011-12-22 DIAGNOSIS — Z23 Encounter for immunization: Secondary | ICD-10-CM | POA: Diagnosis not present

## 2011-12-22 DIAGNOSIS — E119 Type 2 diabetes mellitus without complications: Secondary | ICD-10-CM | POA: Diagnosis not present

## 2011-12-22 DIAGNOSIS — D509 Iron deficiency anemia, unspecified: Secondary | ICD-10-CM | POA: Diagnosis not present

## 2011-12-22 DIAGNOSIS — N186 End stage renal disease: Secondary | ICD-10-CM | POA: Diagnosis not present

## 2011-12-24 DIAGNOSIS — N2581 Secondary hyperparathyroidism of renal origin: Secondary | ICD-10-CM | POA: Diagnosis not present

## 2011-12-24 DIAGNOSIS — N186 End stage renal disease: Secondary | ICD-10-CM | POA: Diagnosis not present

## 2011-12-24 DIAGNOSIS — E119 Type 2 diabetes mellitus without complications: Secondary | ICD-10-CM | POA: Diagnosis not present

## 2011-12-24 DIAGNOSIS — Z992 Dependence on renal dialysis: Secondary | ICD-10-CM | POA: Diagnosis not present

## 2011-12-24 DIAGNOSIS — D509 Iron deficiency anemia, unspecified: Secondary | ICD-10-CM | POA: Diagnosis not present

## 2011-12-24 DIAGNOSIS — Z23 Encounter for immunization: Secondary | ICD-10-CM | POA: Diagnosis not present

## 2011-12-27 DIAGNOSIS — Z23 Encounter for immunization: Secondary | ICD-10-CM | POA: Diagnosis not present

## 2011-12-27 DIAGNOSIS — Z992 Dependence on renal dialysis: Secondary | ICD-10-CM | POA: Diagnosis not present

## 2011-12-27 DIAGNOSIS — N186 End stage renal disease: Secondary | ICD-10-CM | POA: Diagnosis not present

## 2011-12-27 DIAGNOSIS — D509 Iron deficiency anemia, unspecified: Secondary | ICD-10-CM | POA: Diagnosis not present

## 2011-12-27 DIAGNOSIS — E119 Type 2 diabetes mellitus without complications: Secondary | ICD-10-CM | POA: Diagnosis not present

## 2011-12-27 DIAGNOSIS — N2581 Secondary hyperparathyroidism of renal origin: Secondary | ICD-10-CM | POA: Diagnosis not present

## 2011-12-29 DIAGNOSIS — E119 Type 2 diabetes mellitus without complications: Secondary | ICD-10-CM | POA: Diagnosis not present

## 2011-12-29 DIAGNOSIS — N186 End stage renal disease: Secondary | ICD-10-CM | POA: Diagnosis not present

## 2011-12-29 DIAGNOSIS — Z23 Encounter for immunization: Secondary | ICD-10-CM | POA: Diagnosis not present

## 2011-12-29 DIAGNOSIS — D509 Iron deficiency anemia, unspecified: Secondary | ICD-10-CM | POA: Diagnosis not present

## 2011-12-29 DIAGNOSIS — N2581 Secondary hyperparathyroidism of renal origin: Secondary | ICD-10-CM | POA: Diagnosis not present

## 2011-12-29 DIAGNOSIS — Z992 Dependence on renal dialysis: Secondary | ICD-10-CM | POA: Diagnosis not present

## 2011-12-31 DIAGNOSIS — Z992 Dependence on renal dialysis: Secondary | ICD-10-CM | POA: Diagnosis not present

## 2011-12-31 DIAGNOSIS — N2581 Secondary hyperparathyroidism of renal origin: Secondary | ICD-10-CM | POA: Diagnosis not present

## 2011-12-31 DIAGNOSIS — N186 End stage renal disease: Secondary | ICD-10-CM | POA: Diagnosis not present

## 2011-12-31 DIAGNOSIS — E119 Type 2 diabetes mellitus without complications: Secondary | ICD-10-CM | POA: Diagnosis not present

## 2011-12-31 DIAGNOSIS — Z23 Encounter for immunization: Secondary | ICD-10-CM | POA: Diagnosis not present

## 2011-12-31 DIAGNOSIS — D509 Iron deficiency anemia, unspecified: Secondary | ICD-10-CM | POA: Diagnosis not present

## 2012-01-03 DIAGNOSIS — N186 End stage renal disease: Secondary | ICD-10-CM | POA: Diagnosis not present

## 2012-01-03 DIAGNOSIS — Z23 Encounter for immunization: Secondary | ICD-10-CM | POA: Diagnosis not present

## 2012-01-03 DIAGNOSIS — D509 Iron deficiency anemia, unspecified: Secondary | ICD-10-CM | POA: Diagnosis not present

## 2012-01-03 DIAGNOSIS — E119 Type 2 diabetes mellitus without complications: Secondary | ICD-10-CM | POA: Diagnosis not present

## 2012-01-03 DIAGNOSIS — N2581 Secondary hyperparathyroidism of renal origin: Secondary | ICD-10-CM | POA: Diagnosis not present

## 2012-01-03 DIAGNOSIS — Z992 Dependence on renal dialysis: Secondary | ICD-10-CM | POA: Diagnosis not present

## 2012-01-05 DIAGNOSIS — N2581 Secondary hyperparathyroidism of renal origin: Secondary | ICD-10-CM | POA: Diagnosis not present

## 2012-01-05 DIAGNOSIS — E119 Type 2 diabetes mellitus without complications: Secondary | ICD-10-CM | POA: Diagnosis not present

## 2012-01-05 DIAGNOSIS — N186 End stage renal disease: Secondary | ICD-10-CM | POA: Diagnosis not present

## 2012-01-05 DIAGNOSIS — Z23 Encounter for immunization: Secondary | ICD-10-CM | POA: Diagnosis not present

## 2012-01-05 DIAGNOSIS — Z992 Dependence on renal dialysis: Secondary | ICD-10-CM | POA: Diagnosis not present

## 2012-01-05 DIAGNOSIS — D509 Iron deficiency anemia, unspecified: Secondary | ICD-10-CM | POA: Diagnosis not present

## 2012-01-07 DIAGNOSIS — N186 End stage renal disease: Secondary | ICD-10-CM | POA: Diagnosis not present

## 2012-01-07 DIAGNOSIS — Z992 Dependence on renal dialysis: Secondary | ICD-10-CM | POA: Diagnosis not present

## 2012-01-07 DIAGNOSIS — E119 Type 2 diabetes mellitus without complications: Secondary | ICD-10-CM | POA: Diagnosis not present

## 2012-01-07 DIAGNOSIS — Z23 Encounter for immunization: Secondary | ICD-10-CM | POA: Diagnosis not present

## 2012-01-07 DIAGNOSIS — D509 Iron deficiency anemia, unspecified: Secondary | ICD-10-CM | POA: Diagnosis not present

## 2012-01-07 DIAGNOSIS — N2581 Secondary hyperparathyroidism of renal origin: Secondary | ICD-10-CM | POA: Diagnosis not present

## 2012-01-09 DIAGNOSIS — IMO0001 Reserved for inherently not codable concepts without codable children: Secondary | ICD-10-CM | POA: Diagnosis not present

## 2012-01-09 DIAGNOSIS — M25569 Pain in unspecified knee: Secondary | ICD-10-CM | POA: Diagnosis not present

## 2012-01-09 DIAGNOSIS — I10 Essential (primary) hypertension: Secondary | ICD-10-CM | POA: Diagnosis not present

## 2012-01-10 DIAGNOSIS — D509 Iron deficiency anemia, unspecified: Secondary | ICD-10-CM | POA: Diagnosis not present

## 2012-01-10 DIAGNOSIS — N2581 Secondary hyperparathyroidism of renal origin: Secondary | ICD-10-CM | POA: Diagnosis not present

## 2012-01-10 DIAGNOSIS — Z992 Dependence on renal dialysis: Secondary | ICD-10-CM | POA: Diagnosis not present

## 2012-01-10 DIAGNOSIS — E119 Type 2 diabetes mellitus without complications: Secondary | ICD-10-CM | POA: Diagnosis not present

## 2012-01-10 DIAGNOSIS — Z23 Encounter for immunization: Secondary | ICD-10-CM | POA: Diagnosis not present

## 2012-01-10 DIAGNOSIS — N186 End stage renal disease: Secondary | ICD-10-CM | POA: Diagnosis not present

## 2012-01-25 DIAGNOSIS — IMO0001 Reserved for inherently not codable concepts without codable children: Secondary | ICD-10-CM

## 2012-01-25 DIAGNOSIS — Z01818 Encounter for other preprocedural examination: Secondary | ICD-10-CM | POA: Diagnosis not present

## 2012-02-08 DIAGNOSIS — IMO0001 Reserved for inherently not codable concepts without codable children: Secondary | ICD-10-CM | POA: Diagnosis not present

## 2012-02-08 DIAGNOSIS — I10 Essential (primary) hypertension: Secondary | ICD-10-CM | POA: Diagnosis not present

## 2012-02-08 DIAGNOSIS — Z01818 Encounter for other preprocedural examination: Secondary | ICD-10-CM | POA: Diagnosis not present

## 2012-02-08 DIAGNOSIS — N186 End stage renal disease: Secondary | ICD-10-CM | POA: Diagnosis not present

## 2012-02-08 DIAGNOSIS — M25569 Pain in unspecified knee: Secondary | ICD-10-CM | POA: Diagnosis not present

## 2012-02-09 DIAGNOSIS — D509 Iron deficiency anemia, unspecified: Secondary | ICD-10-CM | POA: Diagnosis not present

## 2012-02-09 DIAGNOSIS — N186 End stage renal disease: Secondary | ICD-10-CM | POA: Diagnosis not present

## 2012-02-09 DIAGNOSIS — Z992 Dependence on renal dialysis: Secondary | ICD-10-CM | POA: Diagnosis not present

## 2012-02-09 DIAGNOSIS — E119 Type 2 diabetes mellitus without complications: Secondary | ICD-10-CM | POA: Diagnosis not present

## 2012-02-09 DIAGNOSIS — D631 Anemia in chronic kidney disease: Secondary | ICD-10-CM | POA: Diagnosis not present

## 2012-02-09 DIAGNOSIS — N2581 Secondary hyperparathyroidism of renal origin: Secondary | ICD-10-CM | POA: Diagnosis not present

## 2012-02-11 DIAGNOSIS — E119 Type 2 diabetes mellitus without complications: Secondary | ICD-10-CM | POA: Diagnosis not present

## 2012-02-11 DIAGNOSIS — D509 Iron deficiency anemia, unspecified: Secondary | ICD-10-CM | POA: Diagnosis not present

## 2012-02-11 DIAGNOSIS — N2581 Secondary hyperparathyroidism of renal origin: Secondary | ICD-10-CM | POA: Diagnosis not present

## 2012-02-11 DIAGNOSIS — Z992 Dependence on renal dialysis: Secondary | ICD-10-CM | POA: Diagnosis not present

## 2012-02-11 DIAGNOSIS — D631 Anemia in chronic kidney disease: Secondary | ICD-10-CM | POA: Diagnosis not present

## 2012-02-11 DIAGNOSIS — N186 End stage renal disease: Secondary | ICD-10-CM | POA: Diagnosis not present

## 2012-02-14 DIAGNOSIS — N2581 Secondary hyperparathyroidism of renal origin: Secondary | ICD-10-CM | POA: Diagnosis not present

## 2012-02-14 DIAGNOSIS — Z992 Dependence on renal dialysis: Secondary | ICD-10-CM | POA: Diagnosis not present

## 2012-02-14 DIAGNOSIS — E119 Type 2 diabetes mellitus without complications: Secondary | ICD-10-CM | POA: Diagnosis not present

## 2012-02-14 DIAGNOSIS — D509 Iron deficiency anemia, unspecified: Secondary | ICD-10-CM | POA: Diagnosis not present

## 2012-02-14 DIAGNOSIS — D631 Anemia in chronic kidney disease: Secondary | ICD-10-CM | POA: Diagnosis not present

## 2012-02-14 DIAGNOSIS — N186 End stage renal disease: Secondary | ICD-10-CM | POA: Diagnosis not present

## 2012-02-16 DIAGNOSIS — N2581 Secondary hyperparathyroidism of renal origin: Secondary | ICD-10-CM | POA: Diagnosis not present

## 2012-02-16 DIAGNOSIS — N186 End stage renal disease: Secondary | ICD-10-CM | POA: Diagnosis not present

## 2012-02-16 DIAGNOSIS — E119 Type 2 diabetes mellitus without complications: Secondary | ICD-10-CM | POA: Diagnosis not present

## 2012-02-16 DIAGNOSIS — Z992 Dependence on renal dialysis: Secondary | ICD-10-CM | POA: Diagnosis not present

## 2012-02-16 DIAGNOSIS — D509 Iron deficiency anemia, unspecified: Secondary | ICD-10-CM | POA: Diagnosis not present

## 2012-02-16 DIAGNOSIS — D631 Anemia in chronic kidney disease: Secondary | ICD-10-CM | POA: Diagnosis not present

## 2012-02-18 DIAGNOSIS — D509 Iron deficiency anemia, unspecified: Secondary | ICD-10-CM | POA: Diagnosis not present

## 2012-02-18 DIAGNOSIS — N2581 Secondary hyperparathyroidism of renal origin: Secondary | ICD-10-CM | POA: Diagnosis not present

## 2012-02-18 DIAGNOSIS — Z992 Dependence on renal dialysis: Secondary | ICD-10-CM | POA: Diagnosis not present

## 2012-02-18 DIAGNOSIS — E119 Type 2 diabetes mellitus without complications: Secondary | ICD-10-CM | POA: Diagnosis not present

## 2012-02-18 DIAGNOSIS — N186 End stage renal disease: Secondary | ICD-10-CM | POA: Diagnosis not present

## 2012-02-18 DIAGNOSIS — D631 Anemia in chronic kidney disease: Secondary | ICD-10-CM | POA: Diagnosis not present

## 2012-02-21 DIAGNOSIS — D631 Anemia in chronic kidney disease: Secondary | ICD-10-CM | POA: Diagnosis not present

## 2012-02-21 DIAGNOSIS — Z992 Dependence on renal dialysis: Secondary | ICD-10-CM | POA: Diagnosis not present

## 2012-02-21 DIAGNOSIS — E119 Type 2 diabetes mellitus without complications: Secondary | ICD-10-CM | POA: Diagnosis not present

## 2012-02-21 DIAGNOSIS — D509 Iron deficiency anemia, unspecified: Secondary | ICD-10-CM | POA: Diagnosis not present

## 2012-02-21 DIAGNOSIS — N186 End stage renal disease: Secondary | ICD-10-CM | POA: Diagnosis not present

## 2012-02-21 DIAGNOSIS — N2581 Secondary hyperparathyroidism of renal origin: Secondary | ICD-10-CM | POA: Diagnosis not present

## 2012-02-22 DIAGNOSIS — Z992 Dependence on renal dialysis: Secondary | ICD-10-CM | POA: Diagnosis not present

## 2012-02-22 DIAGNOSIS — R9431 Abnormal electrocardiogram [ECG] [EKG]: Secondary | ICD-10-CM | POA: Diagnosis not present

## 2012-02-22 DIAGNOSIS — Z01818 Encounter for other preprocedural examination: Secondary | ICD-10-CM | POA: Diagnosis not present

## 2012-02-22 DIAGNOSIS — N186 End stage renal disease: Secondary | ICD-10-CM | POA: Diagnosis not present

## 2012-02-22 DIAGNOSIS — I440 Atrioventricular block, first degree: Secondary | ICD-10-CM | POA: Diagnosis not present

## 2012-02-23 DIAGNOSIS — D509 Iron deficiency anemia, unspecified: Secondary | ICD-10-CM | POA: Diagnosis not present

## 2012-02-23 DIAGNOSIS — N2581 Secondary hyperparathyroidism of renal origin: Secondary | ICD-10-CM | POA: Diagnosis not present

## 2012-02-23 DIAGNOSIS — Z992 Dependence on renal dialysis: Secondary | ICD-10-CM | POA: Diagnosis not present

## 2012-02-23 DIAGNOSIS — N186 End stage renal disease: Secondary | ICD-10-CM | POA: Diagnosis not present

## 2012-02-23 DIAGNOSIS — E119 Type 2 diabetes mellitus without complications: Secondary | ICD-10-CM | POA: Diagnosis not present

## 2012-02-23 DIAGNOSIS — D631 Anemia in chronic kidney disease: Secondary | ICD-10-CM | POA: Diagnosis not present

## 2012-02-25 DIAGNOSIS — N186 End stage renal disease: Secondary | ICD-10-CM | POA: Diagnosis not present

## 2012-02-25 DIAGNOSIS — Z992 Dependence on renal dialysis: Secondary | ICD-10-CM | POA: Diagnosis not present

## 2012-02-25 DIAGNOSIS — D509 Iron deficiency anemia, unspecified: Secondary | ICD-10-CM | POA: Diagnosis not present

## 2012-02-25 DIAGNOSIS — E119 Type 2 diabetes mellitus without complications: Secondary | ICD-10-CM | POA: Diagnosis not present

## 2012-02-25 DIAGNOSIS — D631 Anemia in chronic kidney disease: Secondary | ICD-10-CM | POA: Diagnosis not present

## 2012-02-25 DIAGNOSIS — N2581 Secondary hyperparathyroidism of renal origin: Secondary | ICD-10-CM | POA: Diagnosis not present

## 2012-02-28 DIAGNOSIS — D509 Iron deficiency anemia, unspecified: Secondary | ICD-10-CM | POA: Diagnosis not present

## 2012-02-28 DIAGNOSIS — N186 End stage renal disease: Secondary | ICD-10-CM | POA: Diagnosis not present

## 2012-02-28 DIAGNOSIS — E119 Type 2 diabetes mellitus without complications: Secondary | ICD-10-CM | POA: Diagnosis not present

## 2012-02-28 DIAGNOSIS — D631 Anemia in chronic kidney disease: Secondary | ICD-10-CM | POA: Diagnosis not present

## 2012-02-28 DIAGNOSIS — N2581 Secondary hyperparathyroidism of renal origin: Secondary | ICD-10-CM | POA: Diagnosis not present

## 2012-02-28 DIAGNOSIS — Z992 Dependence on renal dialysis: Secondary | ICD-10-CM | POA: Diagnosis not present

## 2012-03-01 DIAGNOSIS — D631 Anemia in chronic kidney disease: Secondary | ICD-10-CM | POA: Diagnosis not present

## 2012-03-01 DIAGNOSIS — Z992 Dependence on renal dialysis: Secondary | ICD-10-CM | POA: Diagnosis not present

## 2012-03-01 DIAGNOSIS — D509 Iron deficiency anemia, unspecified: Secondary | ICD-10-CM | POA: Diagnosis not present

## 2012-03-01 DIAGNOSIS — E1129 Type 2 diabetes mellitus with other diabetic kidney complication: Secondary | ICD-10-CM | POA: Diagnosis not present

## 2012-03-01 DIAGNOSIS — N186 End stage renal disease: Secondary | ICD-10-CM | POA: Diagnosis not present

## 2012-03-01 DIAGNOSIS — E119 Type 2 diabetes mellitus without complications: Secondary | ICD-10-CM | POA: Diagnosis not present

## 2012-03-01 DIAGNOSIS — N2581 Secondary hyperparathyroidism of renal origin: Secondary | ICD-10-CM | POA: Diagnosis not present

## 2012-03-03 DIAGNOSIS — N2581 Secondary hyperparathyroidism of renal origin: Secondary | ICD-10-CM | POA: Diagnosis not present

## 2012-03-03 DIAGNOSIS — Z992 Dependence on renal dialysis: Secondary | ICD-10-CM | POA: Diagnosis not present

## 2012-03-03 DIAGNOSIS — E119 Type 2 diabetes mellitus without complications: Secondary | ICD-10-CM | POA: Diagnosis not present

## 2012-03-03 DIAGNOSIS — D509 Iron deficiency anemia, unspecified: Secondary | ICD-10-CM | POA: Diagnosis not present

## 2012-03-03 DIAGNOSIS — D631 Anemia in chronic kidney disease: Secondary | ICD-10-CM | POA: Diagnosis not present

## 2012-03-03 DIAGNOSIS — N186 End stage renal disease: Secondary | ICD-10-CM | POA: Diagnosis not present

## 2012-03-06 DIAGNOSIS — N186 End stage renal disease: Secondary | ICD-10-CM | POA: Diagnosis not present

## 2012-03-06 DIAGNOSIS — Z992 Dependence on renal dialysis: Secondary | ICD-10-CM | POA: Diagnosis not present

## 2012-03-06 DIAGNOSIS — D631 Anemia in chronic kidney disease: Secondary | ICD-10-CM | POA: Diagnosis not present

## 2012-03-06 DIAGNOSIS — D509 Iron deficiency anemia, unspecified: Secondary | ICD-10-CM | POA: Diagnosis not present

## 2012-03-06 DIAGNOSIS — N2581 Secondary hyperparathyroidism of renal origin: Secondary | ICD-10-CM | POA: Diagnosis not present

## 2012-03-06 DIAGNOSIS — E119 Type 2 diabetes mellitus without complications: Secondary | ICD-10-CM | POA: Diagnosis not present

## 2012-03-08 DIAGNOSIS — N2581 Secondary hyperparathyroidism of renal origin: Secondary | ICD-10-CM | POA: Diagnosis not present

## 2012-03-08 DIAGNOSIS — E119 Type 2 diabetes mellitus without complications: Secondary | ICD-10-CM | POA: Diagnosis not present

## 2012-03-08 DIAGNOSIS — Z992 Dependence on renal dialysis: Secondary | ICD-10-CM | POA: Diagnosis not present

## 2012-03-08 DIAGNOSIS — D509 Iron deficiency anemia, unspecified: Secondary | ICD-10-CM | POA: Diagnosis not present

## 2012-03-08 DIAGNOSIS — N186 End stage renal disease: Secondary | ICD-10-CM | POA: Diagnosis not present

## 2012-03-08 DIAGNOSIS — D631 Anemia in chronic kidney disease: Secondary | ICD-10-CM | POA: Diagnosis not present

## 2012-03-09 DIAGNOSIS — Z01818 Encounter for other preprocedural examination: Secondary | ICD-10-CM | POA: Diagnosis not present

## 2012-03-09 DIAGNOSIS — E1129 Type 2 diabetes mellitus with other diabetic kidney complication: Secondary | ICD-10-CM | POA: Diagnosis not present

## 2012-03-09 DIAGNOSIS — Z01810 Encounter for preprocedural cardiovascular examination: Secondary | ICD-10-CM | POA: Diagnosis not present

## 2012-03-09 DIAGNOSIS — N186 End stage renal disease: Secondary | ICD-10-CM | POA: Diagnosis not present

## 2012-03-09 DIAGNOSIS — I120 Hypertensive chronic kidney disease with stage 5 chronic kidney disease or end stage renal disease: Secondary | ICD-10-CM | POA: Diagnosis not present

## 2012-03-10 DIAGNOSIS — I10 Essential (primary) hypertension: Secondary | ICD-10-CM | POA: Diagnosis not present

## 2012-03-10 DIAGNOSIS — Z992 Dependence on renal dialysis: Secondary | ICD-10-CM | POA: Diagnosis not present

## 2012-03-10 DIAGNOSIS — N186 End stage renal disease: Secondary | ICD-10-CM | POA: Diagnosis not present

## 2012-03-10 DIAGNOSIS — IMO0001 Reserved for inherently not codable concepts without codable children: Secondary | ICD-10-CM | POA: Diagnosis not present

## 2012-03-10 DIAGNOSIS — D509 Iron deficiency anemia, unspecified: Secondary | ICD-10-CM | POA: Diagnosis not present

## 2012-03-10 DIAGNOSIS — N2581 Secondary hyperparathyroidism of renal origin: Secondary | ICD-10-CM | POA: Diagnosis not present

## 2012-03-10 DIAGNOSIS — M25569 Pain in unspecified knee: Secondary | ICD-10-CM | POA: Diagnosis not present

## 2012-03-10 DIAGNOSIS — D631 Anemia in chronic kidney disease: Secondary | ICD-10-CM | POA: Diagnosis not present

## 2012-03-10 DIAGNOSIS — E119 Type 2 diabetes mellitus without complications: Secondary | ICD-10-CM | POA: Diagnosis not present

## 2012-03-23 DIAGNOSIS — K648 Other hemorrhoids: Secondary | ICD-10-CM | POA: Diagnosis not present

## 2012-03-23 DIAGNOSIS — Z1211 Encounter for screening for malignant neoplasm of colon: Secondary | ICD-10-CM | POA: Diagnosis not present

## 2012-04-10 DIAGNOSIS — D631 Anemia in chronic kidney disease: Secondary | ICD-10-CM | POA: Diagnosis not present

## 2012-04-10 DIAGNOSIS — D509 Iron deficiency anemia, unspecified: Secondary | ICD-10-CM | POA: Diagnosis not present

## 2012-04-10 DIAGNOSIS — Z992 Dependence on renal dialysis: Secondary | ICD-10-CM | POA: Diagnosis not present

## 2012-04-10 DIAGNOSIS — E119 Type 2 diabetes mellitus without complications: Secondary | ICD-10-CM | POA: Diagnosis not present

## 2012-04-10 DIAGNOSIS — N2581 Secondary hyperparathyroidism of renal origin: Secondary | ICD-10-CM | POA: Diagnosis not present

## 2012-04-10 DIAGNOSIS — N186 End stage renal disease: Secondary | ICD-10-CM | POA: Diagnosis not present

## 2012-04-19 DIAGNOSIS — N186 End stage renal disease: Secondary | ICD-10-CM | POA: Diagnosis not present

## 2012-04-19 DIAGNOSIS — Z992 Dependence on renal dialysis: Secondary | ICD-10-CM | POA: Diagnosis not present

## 2012-05-12 DIAGNOSIS — N186 End stage renal disease: Secondary | ICD-10-CM | POA: Diagnosis not present

## 2012-05-12 DIAGNOSIS — Z992 Dependence on renal dialysis: Secondary | ICD-10-CM | POA: Diagnosis not present

## 2012-05-12 DIAGNOSIS — N2581 Secondary hyperparathyroidism of renal origin: Secondary | ICD-10-CM | POA: Diagnosis not present

## 2012-05-12 DIAGNOSIS — D509 Iron deficiency anemia, unspecified: Secondary | ICD-10-CM | POA: Diagnosis not present

## 2012-05-12 DIAGNOSIS — E119 Type 2 diabetes mellitus without complications: Secondary | ICD-10-CM | POA: Diagnosis not present

## 2012-05-15 DIAGNOSIS — Z992 Dependence on renal dialysis: Secondary | ICD-10-CM | POA: Diagnosis not present

## 2012-05-15 DIAGNOSIS — N186 End stage renal disease: Secondary | ICD-10-CM | POA: Diagnosis not present

## 2012-05-15 DIAGNOSIS — N2581 Secondary hyperparathyroidism of renal origin: Secondary | ICD-10-CM | POA: Diagnosis not present

## 2012-05-15 DIAGNOSIS — D509 Iron deficiency anemia, unspecified: Secondary | ICD-10-CM | POA: Diagnosis not present

## 2012-05-15 DIAGNOSIS — E119 Type 2 diabetes mellitus without complications: Secondary | ICD-10-CM | POA: Diagnosis not present

## 2012-05-17 DIAGNOSIS — Z992 Dependence on renal dialysis: Secondary | ICD-10-CM | POA: Diagnosis not present

## 2012-05-17 DIAGNOSIS — D509 Iron deficiency anemia, unspecified: Secondary | ICD-10-CM | POA: Diagnosis not present

## 2012-05-17 DIAGNOSIS — E119 Type 2 diabetes mellitus without complications: Secondary | ICD-10-CM | POA: Diagnosis not present

## 2012-05-17 DIAGNOSIS — N2581 Secondary hyperparathyroidism of renal origin: Secondary | ICD-10-CM | POA: Diagnosis not present

## 2012-05-17 DIAGNOSIS — N186 End stage renal disease: Secondary | ICD-10-CM | POA: Diagnosis not present

## 2012-05-18 DIAGNOSIS — G253 Myoclonus: Secondary | ICD-10-CM | POA: Diagnosis not present

## 2012-05-19 DIAGNOSIS — N186 End stage renal disease: Secondary | ICD-10-CM | POA: Diagnosis not present

## 2012-05-19 DIAGNOSIS — N2581 Secondary hyperparathyroidism of renal origin: Secondary | ICD-10-CM | POA: Diagnosis not present

## 2012-05-19 DIAGNOSIS — E119 Type 2 diabetes mellitus without complications: Secondary | ICD-10-CM | POA: Diagnosis not present

## 2012-05-19 DIAGNOSIS — D509 Iron deficiency anemia, unspecified: Secondary | ICD-10-CM | POA: Diagnosis not present

## 2012-05-19 DIAGNOSIS — Z992 Dependence on renal dialysis: Secondary | ICD-10-CM | POA: Diagnosis not present

## 2012-05-22 DIAGNOSIS — Z992 Dependence on renal dialysis: Secondary | ICD-10-CM | POA: Diagnosis not present

## 2012-05-22 DIAGNOSIS — D509 Iron deficiency anemia, unspecified: Secondary | ICD-10-CM | POA: Diagnosis not present

## 2012-05-22 DIAGNOSIS — E119 Type 2 diabetes mellitus without complications: Secondary | ICD-10-CM | POA: Diagnosis not present

## 2012-05-22 DIAGNOSIS — N186 End stage renal disease: Secondary | ICD-10-CM | POA: Diagnosis not present

## 2012-05-22 DIAGNOSIS — N2581 Secondary hyperparathyroidism of renal origin: Secondary | ICD-10-CM | POA: Diagnosis not present

## 2012-05-24 DIAGNOSIS — N2581 Secondary hyperparathyroidism of renal origin: Secondary | ICD-10-CM | POA: Diagnosis not present

## 2012-05-24 DIAGNOSIS — N186 End stage renal disease: Secondary | ICD-10-CM | POA: Diagnosis not present

## 2012-05-24 DIAGNOSIS — Z992 Dependence on renal dialysis: Secondary | ICD-10-CM | POA: Diagnosis not present

## 2012-05-24 DIAGNOSIS — D509 Iron deficiency anemia, unspecified: Secondary | ICD-10-CM | POA: Diagnosis not present

## 2012-05-24 DIAGNOSIS — E119 Type 2 diabetes mellitus without complications: Secondary | ICD-10-CM | POA: Diagnosis not present

## 2012-05-26 DIAGNOSIS — E119 Type 2 diabetes mellitus without complications: Secondary | ICD-10-CM | POA: Diagnosis not present

## 2012-05-26 DIAGNOSIS — Z992 Dependence on renal dialysis: Secondary | ICD-10-CM | POA: Diagnosis not present

## 2012-05-26 DIAGNOSIS — N186 End stage renal disease: Secondary | ICD-10-CM | POA: Diagnosis not present

## 2012-05-26 DIAGNOSIS — N2581 Secondary hyperparathyroidism of renal origin: Secondary | ICD-10-CM | POA: Diagnosis not present

## 2012-05-26 DIAGNOSIS — D509 Iron deficiency anemia, unspecified: Secondary | ICD-10-CM | POA: Diagnosis not present

## 2012-05-29 DIAGNOSIS — N186 End stage renal disease: Secondary | ICD-10-CM | POA: Diagnosis not present

## 2012-05-29 DIAGNOSIS — D509 Iron deficiency anemia, unspecified: Secondary | ICD-10-CM | POA: Diagnosis not present

## 2012-05-29 DIAGNOSIS — Z992 Dependence on renal dialysis: Secondary | ICD-10-CM | POA: Diagnosis not present

## 2012-05-29 DIAGNOSIS — E119 Type 2 diabetes mellitus without complications: Secondary | ICD-10-CM | POA: Diagnosis not present

## 2012-05-29 DIAGNOSIS — N2581 Secondary hyperparathyroidism of renal origin: Secondary | ICD-10-CM | POA: Diagnosis not present

## 2012-05-31 DIAGNOSIS — N186 End stage renal disease: Secondary | ICD-10-CM | POA: Diagnosis not present

## 2012-05-31 DIAGNOSIS — D509 Iron deficiency anemia, unspecified: Secondary | ICD-10-CM | POA: Diagnosis not present

## 2012-05-31 DIAGNOSIS — N2581 Secondary hyperparathyroidism of renal origin: Secondary | ICD-10-CM | POA: Diagnosis not present

## 2012-05-31 DIAGNOSIS — Z992 Dependence on renal dialysis: Secondary | ICD-10-CM | POA: Diagnosis not present

## 2012-05-31 DIAGNOSIS — E1129 Type 2 diabetes mellitus with other diabetic kidney complication: Secondary | ICD-10-CM | POA: Diagnosis not present

## 2012-05-31 DIAGNOSIS — E119 Type 2 diabetes mellitus without complications: Secondary | ICD-10-CM | POA: Diagnosis not present

## 2012-06-02 DIAGNOSIS — E119 Type 2 diabetes mellitus without complications: Secondary | ICD-10-CM | POA: Diagnosis not present

## 2012-06-02 DIAGNOSIS — D509 Iron deficiency anemia, unspecified: Secondary | ICD-10-CM | POA: Diagnosis not present

## 2012-06-02 DIAGNOSIS — Z992 Dependence on renal dialysis: Secondary | ICD-10-CM | POA: Diagnosis not present

## 2012-06-02 DIAGNOSIS — N2581 Secondary hyperparathyroidism of renal origin: Secondary | ICD-10-CM | POA: Diagnosis not present

## 2012-06-02 DIAGNOSIS — N186 End stage renal disease: Secondary | ICD-10-CM | POA: Diagnosis not present

## 2012-06-05 DIAGNOSIS — N2581 Secondary hyperparathyroidism of renal origin: Secondary | ICD-10-CM | POA: Diagnosis not present

## 2012-06-05 DIAGNOSIS — Z992 Dependence on renal dialysis: Secondary | ICD-10-CM | POA: Diagnosis not present

## 2012-06-05 DIAGNOSIS — D509 Iron deficiency anemia, unspecified: Secondary | ICD-10-CM | POA: Diagnosis not present

## 2012-06-05 DIAGNOSIS — E119 Type 2 diabetes mellitus without complications: Secondary | ICD-10-CM | POA: Diagnosis not present

## 2012-06-05 DIAGNOSIS — N186 End stage renal disease: Secondary | ICD-10-CM | POA: Diagnosis not present

## 2012-06-07 DIAGNOSIS — Z992 Dependence on renal dialysis: Secondary | ICD-10-CM | POA: Diagnosis not present

## 2012-06-07 DIAGNOSIS — N2581 Secondary hyperparathyroidism of renal origin: Secondary | ICD-10-CM | POA: Diagnosis not present

## 2012-06-07 DIAGNOSIS — D509 Iron deficiency anemia, unspecified: Secondary | ICD-10-CM | POA: Diagnosis not present

## 2012-06-07 DIAGNOSIS — E119 Type 2 diabetes mellitus without complications: Secondary | ICD-10-CM | POA: Diagnosis not present

## 2012-06-07 DIAGNOSIS — N186 End stage renal disease: Secondary | ICD-10-CM | POA: Diagnosis not present

## 2012-06-09 DIAGNOSIS — E119 Type 2 diabetes mellitus without complications: Secondary | ICD-10-CM | POA: Diagnosis not present

## 2012-06-09 DIAGNOSIS — D509 Iron deficiency anemia, unspecified: Secondary | ICD-10-CM | POA: Diagnosis not present

## 2012-06-09 DIAGNOSIS — N186 End stage renal disease: Secondary | ICD-10-CM | POA: Diagnosis not present

## 2012-06-09 DIAGNOSIS — N2581 Secondary hyperparathyroidism of renal origin: Secondary | ICD-10-CM | POA: Diagnosis not present

## 2012-06-09 DIAGNOSIS — Z992 Dependence on renal dialysis: Secondary | ICD-10-CM | POA: Diagnosis not present

## 2012-06-10 DIAGNOSIS — IMO0001 Reserved for inherently not codable concepts without codable children: Secondary | ICD-10-CM | POA: Diagnosis not present

## 2012-06-10 DIAGNOSIS — I10 Essential (primary) hypertension: Secondary | ICD-10-CM | POA: Diagnosis not present

## 2012-06-10 DIAGNOSIS — M25569 Pain in unspecified knee: Secondary | ICD-10-CM | POA: Diagnosis not present

## 2012-06-10 DIAGNOSIS — N186 End stage renal disease: Secondary | ICD-10-CM | POA: Diagnosis not present

## 2012-06-12 DIAGNOSIS — D631 Anemia in chronic kidney disease: Secondary | ICD-10-CM | POA: Diagnosis not present

## 2012-06-12 DIAGNOSIS — D509 Iron deficiency anemia, unspecified: Secondary | ICD-10-CM | POA: Diagnosis not present

## 2012-06-12 DIAGNOSIS — E1129 Type 2 diabetes mellitus with other diabetic kidney complication: Secondary | ICD-10-CM | POA: Diagnosis not present

## 2012-06-12 DIAGNOSIS — N186 End stage renal disease: Secondary | ICD-10-CM | POA: Diagnosis not present

## 2012-06-12 DIAGNOSIS — E119 Type 2 diabetes mellitus without complications: Secondary | ICD-10-CM | POA: Diagnosis not present

## 2012-06-12 DIAGNOSIS — N2581 Secondary hyperparathyroidism of renal origin: Secondary | ICD-10-CM | POA: Diagnosis not present

## 2012-06-13 DIAGNOSIS — I120 Hypertensive chronic kidney disease with stage 5 chronic kidney disease or end stage renal disease: Secondary | ICD-10-CM | POA: Diagnosis not present

## 2012-06-13 DIAGNOSIS — R0789 Other chest pain: Secondary | ICD-10-CM | POA: Diagnosis not present

## 2012-06-13 DIAGNOSIS — I2510 Atherosclerotic heart disease of native coronary artery without angina pectoris: Secondary | ICD-10-CM | POA: Diagnosis not present

## 2012-06-13 DIAGNOSIS — D649 Anemia, unspecified: Secondary | ICD-10-CM | POA: Diagnosis not present

## 2012-06-13 DIAGNOSIS — E119 Type 2 diabetes mellitus without complications: Secondary | ICD-10-CM | POA: Diagnosis not present

## 2012-06-13 DIAGNOSIS — Z01810 Encounter for preprocedural cardiovascular examination: Secondary | ICD-10-CM | POA: Diagnosis not present

## 2012-06-13 DIAGNOSIS — N186 End stage renal disease: Secondary | ICD-10-CM | POA: Diagnosis not present

## 2012-07-10 DIAGNOSIS — D509 Iron deficiency anemia, unspecified: Secondary | ICD-10-CM | POA: Diagnosis not present

## 2012-07-10 DIAGNOSIS — N2581 Secondary hyperparathyroidism of renal origin: Secondary | ICD-10-CM | POA: Diagnosis not present

## 2012-07-10 DIAGNOSIS — N186 End stage renal disease: Secondary | ICD-10-CM | POA: Diagnosis not present

## 2012-07-10 DIAGNOSIS — D631 Anemia in chronic kidney disease: Secondary | ICD-10-CM | POA: Diagnosis not present

## 2012-08-07 DIAGNOSIS — N186 End stage renal disease: Secondary | ICD-10-CM | POA: Diagnosis not present

## 2012-08-09 DIAGNOSIS — N186 End stage renal disease: Secondary | ICD-10-CM | POA: Diagnosis not present

## 2012-08-09 DIAGNOSIS — D509 Iron deficiency anemia, unspecified: Secondary | ICD-10-CM | POA: Diagnosis not present

## 2012-08-09 DIAGNOSIS — N2581 Secondary hyperparathyroidism of renal origin: Secondary | ICD-10-CM | POA: Diagnosis not present

## 2012-08-09 DIAGNOSIS — D631 Anemia in chronic kidney disease: Secondary | ICD-10-CM | POA: Diagnosis not present

## 2012-08-09 DIAGNOSIS — E1129 Type 2 diabetes mellitus with other diabetic kidney complication: Secondary | ICD-10-CM | POA: Diagnosis not present

## 2012-08-11 DIAGNOSIS — N2581 Secondary hyperparathyroidism of renal origin: Secondary | ICD-10-CM | POA: Diagnosis not present

## 2012-08-11 DIAGNOSIS — E1129 Type 2 diabetes mellitus with other diabetic kidney complication: Secondary | ICD-10-CM | POA: Diagnosis not present

## 2012-08-11 DIAGNOSIS — D631 Anemia in chronic kidney disease: Secondary | ICD-10-CM | POA: Diagnosis not present

## 2012-08-11 DIAGNOSIS — D509 Iron deficiency anemia, unspecified: Secondary | ICD-10-CM | POA: Diagnosis not present

## 2012-08-11 DIAGNOSIS — N186 End stage renal disease: Secondary | ICD-10-CM | POA: Diagnosis not present

## 2012-08-14 DIAGNOSIS — N2581 Secondary hyperparathyroidism of renal origin: Secondary | ICD-10-CM | POA: Diagnosis not present

## 2012-08-14 DIAGNOSIS — E1129 Type 2 diabetes mellitus with other diabetic kidney complication: Secondary | ICD-10-CM | POA: Diagnosis not present

## 2012-08-14 DIAGNOSIS — D509 Iron deficiency anemia, unspecified: Secondary | ICD-10-CM | POA: Diagnosis not present

## 2012-08-14 DIAGNOSIS — D631 Anemia in chronic kidney disease: Secondary | ICD-10-CM | POA: Diagnosis not present

## 2012-08-14 DIAGNOSIS — N186 End stage renal disease: Secondary | ICD-10-CM | POA: Diagnosis not present

## 2012-08-15 DIAGNOSIS — I2510 Atherosclerotic heart disease of native coronary artery without angina pectoris: Secondary | ICD-10-CM | POA: Diagnosis not present

## 2012-08-16 DIAGNOSIS — E1129 Type 2 diabetes mellitus with other diabetic kidney complication: Secondary | ICD-10-CM | POA: Diagnosis not present

## 2012-08-16 DIAGNOSIS — D509 Iron deficiency anemia, unspecified: Secondary | ICD-10-CM | POA: Diagnosis not present

## 2012-08-16 DIAGNOSIS — N186 End stage renal disease: Secondary | ICD-10-CM | POA: Diagnosis not present

## 2012-08-16 DIAGNOSIS — N2581 Secondary hyperparathyroidism of renal origin: Secondary | ICD-10-CM | POA: Diagnosis not present

## 2012-08-16 DIAGNOSIS — D631 Anemia in chronic kidney disease: Secondary | ICD-10-CM | POA: Diagnosis not present

## 2012-08-18 DIAGNOSIS — D631 Anemia in chronic kidney disease: Secondary | ICD-10-CM | POA: Diagnosis not present

## 2012-08-18 DIAGNOSIS — N186 End stage renal disease: Secondary | ICD-10-CM | POA: Diagnosis not present

## 2012-08-18 DIAGNOSIS — D509 Iron deficiency anemia, unspecified: Secondary | ICD-10-CM | POA: Diagnosis not present

## 2012-08-18 DIAGNOSIS — E1129 Type 2 diabetes mellitus with other diabetic kidney complication: Secondary | ICD-10-CM | POA: Diagnosis not present

## 2012-08-18 DIAGNOSIS — N2581 Secondary hyperparathyroidism of renal origin: Secondary | ICD-10-CM | POA: Diagnosis not present

## 2012-08-21 DIAGNOSIS — N186 End stage renal disease: Secondary | ICD-10-CM | POA: Diagnosis not present

## 2012-08-21 DIAGNOSIS — E1129 Type 2 diabetes mellitus with other diabetic kidney complication: Secondary | ICD-10-CM | POA: Diagnosis not present

## 2012-08-21 DIAGNOSIS — D509 Iron deficiency anemia, unspecified: Secondary | ICD-10-CM | POA: Diagnosis not present

## 2012-08-21 DIAGNOSIS — N2581 Secondary hyperparathyroidism of renal origin: Secondary | ICD-10-CM | POA: Diagnosis not present

## 2012-08-21 DIAGNOSIS — D631 Anemia in chronic kidney disease: Secondary | ICD-10-CM | POA: Diagnosis not present

## 2012-08-23 DIAGNOSIS — N186 End stage renal disease: Secondary | ICD-10-CM | POA: Diagnosis not present

## 2012-08-23 DIAGNOSIS — D509 Iron deficiency anemia, unspecified: Secondary | ICD-10-CM | POA: Diagnosis not present

## 2012-08-23 DIAGNOSIS — E1129 Type 2 diabetes mellitus with other diabetic kidney complication: Secondary | ICD-10-CM | POA: Diagnosis not present

## 2012-08-23 DIAGNOSIS — N2581 Secondary hyperparathyroidism of renal origin: Secondary | ICD-10-CM | POA: Diagnosis not present

## 2012-08-23 DIAGNOSIS — D631 Anemia in chronic kidney disease: Secondary | ICD-10-CM | POA: Diagnosis not present

## 2012-08-25 DIAGNOSIS — N2581 Secondary hyperparathyroidism of renal origin: Secondary | ICD-10-CM | POA: Diagnosis not present

## 2012-08-25 DIAGNOSIS — E1129 Type 2 diabetes mellitus with other diabetic kidney complication: Secondary | ICD-10-CM | POA: Diagnosis not present

## 2012-08-25 DIAGNOSIS — D631 Anemia in chronic kidney disease: Secondary | ICD-10-CM | POA: Diagnosis not present

## 2012-08-25 DIAGNOSIS — D509 Iron deficiency anemia, unspecified: Secondary | ICD-10-CM | POA: Diagnosis not present

## 2012-08-25 DIAGNOSIS — N186 End stage renal disease: Secondary | ICD-10-CM | POA: Diagnosis not present

## 2012-08-28 DIAGNOSIS — N2581 Secondary hyperparathyroidism of renal origin: Secondary | ICD-10-CM | POA: Diagnosis not present

## 2012-08-28 DIAGNOSIS — D509 Iron deficiency anemia, unspecified: Secondary | ICD-10-CM | POA: Diagnosis not present

## 2012-08-28 DIAGNOSIS — E1129 Type 2 diabetes mellitus with other diabetic kidney complication: Secondary | ICD-10-CM | POA: Diagnosis not present

## 2012-08-28 DIAGNOSIS — D631 Anemia in chronic kidney disease: Secondary | ICD-10-CM | POA: Diagnosis not present

## 2012-08-28 DIAGNOSIS — N186 End stage renal disease: Secondary | ICD-10-CM | POA: Diagnosis not present

## 2012-08-30 DIAGNOSIS — N186 End stage renal disease: Secondary | ICD-10-CM | POA: Diagnosis not present

## 2012-08-30 DIAGNOSIS — D631 Anemia in chronic kidney disease: Secondary | ICD-10-CM | POA: Diagnosis not present

## 2012-08-30 DIAGNOSIS — D509 Iron deficiency anemia, unspecified: Secondary | ICD-10-CM | POA: Diagnosis not present

## 2012-08-30 DIAGNOSIS — N2581 Secondary hyperparathyroidism of renal origin: Secondary | ICD-10-CM | POA: Diagnosis not present

## 2012-08-30 DIAGNOSIS — E1129 Type 2 diabetes mellitus with other diabetic kidney complication: Secondary | ICD-10-CM | POA: Diagnosis not present

## 2012-09-01 DIAGNOSIS — N2581 Secondary hyperparathyroidism of renal origin: Secondary | ICD-10-CM | POA: Diagnosis not present

## 2012-09-01 DIAGNOSIS — N186 End stage renal disease: Secondary | ICD-10-CM | POA: Diagnosis not present

## 2012-09-01 DIAGNOSIS — D631 Anemia in chronic kidney disease: Secondary | ICD-10-CM | POA: Diagnosis not present

## 2012-09-01 DIAGNOSIS — D509 Iron deficiency anemia, unspecified: Secondary | ICD-10-CM | POA: Diagnosis not present

## 2012-09-01 DIAGNOSIS — E1129 Type 2 diabetes mellitus with other diabetic kidney complication: Secondary | ICD-10-CM | POA: Diagnosis not present

## 2012-09-03 ENCOUNTER — Emergency Department (HOSPITAL_COMMUNITY)
Admission: EM | Admit: 2012-09-03 | Discharge: 2012-09-03 | Disposition: A | Discharge status: Home / Self Care | Payer: Medicare Other | Attending: Emergency Medicine | Admitting: Emergency Medicine

## 2012-09-03 ENCOUNTER — Encounter (HOSPITAL_COMMUNITY): Payer: Self-pay | Admitting: Emergency Medicine

## 2012-09-03 DIAGNOSIS — I120 Hypertensive chronic kidney disease with stage 5 chronic kidney disease or end stage renal disease: Secondary | ICD-10-CM | POA: Diagnosis not present

## 2012-09-03 DIAGNOSIS — T402X1A Poisoning by other opioids, accidental (unintentional), initial encounter: Secondary | ICD-10-CM

## 2012-09-03 DIAGNOSIS — N186 End stage renal disease: Secondary | ICD-10-CM | POA: Insufficient documentation

## 2012-09-03 DIAGNOSIS — R4182 Altered mental status, unspecified: Secondary | ICD-10-CM | POA: Diagnosis not present

## 2012-09-03 DIAGNOSIS — M545 Low back pain, unspecified: Secondary | ICD-10-CM | POA: Diagnosis not present

## 2012-09-03 DIAGNOSIS — Z992 Dependence on renal dialysis: Secondary | ICD-10-CM | POA: Insufficient documentation

## 2012-09-03 DIAGNOSIS — Z87448 Personal history of other diseases of urinary system: Secondary | ICD-10-CM | POA: Diagnosis not present

## 2012-09-03 DIAGNOSIS — Z7982 Long term (current) use of aspirin: Secondary | ICD-10-CM | POA: Insufficient documentation

## 2012-09-03 DIAGNOSIS — Y939 Activity, unspecified: Secondary | ICD-10-CM | POA: Insufficient documentation

## 2012-09-03 DIAGNOSIS — E119 Type 2 diabetes mellitus without complications: Secondary | ICD-10-CM | POA: Diagnosis not present

## 2012-09-03 DIAGNOSIS — T40601A Poisoning by unspecified narcotics, accidental (unintentional), initial encounter: Secondary | ICD-10-CM | POA: Diagnosis not present

## 2012-09-03 DIAGNOSIS — G8929 Other chronic pain: Secondary | ICD-10-CM | POA: Diagnosis not present

## 2012-09-03 DIAGNOSIS — Y929 Unspecified place or not applicable: Secondary | ICD-10-CM | POA: Insufficient documentation

## 2012-09-03 DIAGNOSIS — Z794 Long term (current) use of insulin: Secondary | ICD-10-CM | POA: Insufficient documentation

## 2012-09-03 DIAGNOSIS — T400X1A Poisoning by opium, accidental (unintentional), initial encounter: Secondary | ICD-10-CM | POA: Insufficient documentation

## 2012-09-03 LAB — COMPREHENSIVE METABOLIC PANEL
ALT: 30 U/L (ref 0–53)
AST: 26 U/L (ref 0–37)
Albumin: 3.6 g/dL (ref 3.5–5.2)
Alkaline Phosphatase: 133 U/L — ABNORMAL HIGH (ref 39–117)
BUN: 39 mg/dL — ABNORMAL HIGH (ref 6–23)
CO2: 31 mEq/L (ref 19–32)
Calcium: 10.5 mg/dL (ref 8.4–10.5)
Chloride: 96 mEq/L (ref 96–112)
Creatinine, Ser: 11.51 mg/dL — ABNORMAL HIGH (ref 0.50–1.35)
GFR calc Af Amer: 5 mL/min — ABNORMAL LOW (ref >90)
GFR calc non Af Amer: 4 mL/min — ABNORMAL LOW (ref >90)
Glucose, Bld: 103 mg/dL — ABNORMAL HIGH (ref 70–99)
Potassium: 4.1 mEq/L (ref 3.5–5.1)
Sodium: 139 mEq/L (ref 135–145)
Total Bilirubin: 0.2 mg/dL — ABNORMAL LOW (ref 0.3–1.2)
Total Protein: 8.2 g/dL (ref 6.0–8.3)

## 2012-09-03 LAB — CBC WITH DIFFERENTIAL/PLATELET
Basophils Absolute: 0 10*3/uL (ref 0.0–0.1)
Basophils Relative: 0 % (ref 0–1)
Eosinophils Absolute: 0.3 10*3/uL (ref 0.0–0.7)
Eosinophils Relative: 4 % (ref 0–5)
HCT: 32.1 % — ABNORMAL LOW (ref 39.0–52.0)
Hemoglobin: 11 g/dL — ABNORMAL LOW (ref 13.0–17.0)
Lymphocytes Relative: 22 % (ref 12–46)
Lymphs Abs: 1.9 10*3/uL (ref 0.7–4.0)
MCH: 28.5 pg (ref 26.0–34.0)
MCHC: 34.3 g/dL (ref 30.0–36.0)
MCV: 83.2 fL (ref 78.0–100.0)
Monocytes Absolute: 0.5 10*3/uL (ref 0.1–1.0)
Monocytes Relative: 6 % (ref 3–12)
Neutro Abs: 5.7 10*3/uL (ref 1.7–7.7)
Neutrophils Relative %: 68 % (ref 43–77)
Platelets: 218 10*3/uL (ref 150–400)
RBC: 3.86 MIL/uL — ABNORMAL LOW (ref 4.22–5.81)
RDW: 13.6 % (ref 11.5–15.5)
WBC: 8.4 10*3/uL (ref 4.0–10.5)

## 2012-09-03 LAB — ACETAMINOPHEN LEVEL: Acetaminophen (Tylenol), Serum: <15 ug/mL (ref 10–30)

## 2012-09-03 LAB — POCT I-STAT, CHEM 8
BUN: 41 mg/dL — ABNORMAL HIGH (ref 6–23)
Calcium, Ion: 1.22 mmol/L (ref 1.12–1.23)
Chloride: 100 mEq/L (ref 96–112)
Creatinine, Ser: 9.6 mg/dL — ABNORMAL HIGH (ref 0.50–1.35)
Glucose, Bld: 104 mg/dL — ABNORMAL HIGH (ref 70–99)
HCT: 33 % — ABNORMAL LOW (ref 39.0–52.0)
Hemoglobin: 11.2 g/dL — ABNORMAL LOW (ref 13.0–17.0)
Potassium: 4 mEq/L (ref 3.5–5.1)
Sodium: 141 mEq/L (ref 135–145)
TCO2: 32 mmol/L (ref 0–100)

## 2012-09-03 MED ORDER — HYDROCODONE-ACETAMINOPHEN 5-325 MG PO TABS: 1.0000 | ORAL_TABLET | Freq: Three times a day (TID) | ORAL | Status: DC | PRN

## 2012-09-03 MED ORDER — ONDANSETRON HCL 4 MG/2ML IJ SOLN
4.0000 mg | Freq: Once | INTRAMUSCULAR | Status: AC
  Administered 2012-09-03: 4 mg via INTRAVENOUS
  Filled 2012-09-03: qty 2

## 2012-09-03 MED ORDER — PROMETHAZINE HCL 25 MG/ML IJ SOLN
12.5000 mg | Freq: Once | INTRAMUSCULAR | Status: AC
  Administered 2012-09-03: 12.5 mg via INTRAVENOUS
  Filled 2012-09-03: qty 1

## 2012-09-03 MED ORDER — NALOXONE HCL 0.4 MG/ML IJ SOLN
INTRAMUSCULAR | Status: AC
  Filled 2012-09-03: qty 3

## 2012-09-03 NOTE — ED Provider Notes (Signed)
History     CSN: [id number]  Arrival date & time 09/03/12  [id number]   First MD Initiated Contact with Patient 09/03/12 0247      Chief Complaint  Patient presents with  . Drug Overdose    HPI has history of diabetes, hypertension end-stage renal disease on dialysis who is been recently started on Percocet for chronic low back pain. Says he took some Percocet this evening and went to sleep.  Patient arrives via EMS was called by wife for altered mental status, patient was unresponsive, on EMS arrival he had pinpoint pupils was given 2 mg of Narcan which increased his level of consciousness and he started having profuse vomiting.  Patient says he is still nauseous, is having some mild low back pain. Otherwise he feels okay, denies any chest pain, shortness of breath, cough, fever, chills. Patient denies any alcohol or recreational drugs, does not smoke. His nephrologist is Dr. Rolan Lipa. He has had a previous knee replacement also causes some pain occasionally.  Past Medical History  Diagnosis Date  . Renal disorder   . Diabetes mellitus   . Hypertension   . Dialysis care   . Renal failure     History reviewed. No pertinent past surgical history.  No family history on file.  History  Substance Use Topics  . Smoking status: Never Smoker   . Smokeless tobacco: Not on file  . Alcohol Use: No      Review of Systems GENERAL: No fevers or chills, weight loss. HEENT: No change in vision, no earache, sore throat or sinus congestion. NECK: No stiffness. CHRONIC NECK PAIN; CARDIOVASCULAR: No chest pain or pressure, palpitations, syncope. PULMONARY: No shortness of breath, cough or wheeze. GASTROINTESTINAL: No abdominal pain, or diarrhea, melena or bright red blood per rectum. Had nausea, vomiting w/ acid reflux. GENITOURINARY:Oliguric, No urinary frequency, urgency, hesitancy or dysuria. MUSCULOSKELETAL: No joint or muscle pain, no recent trauma. DERMATOLOGIC: No rash, no itching, no  lesions. BACK: Chronic ENDOCRINE: No polyuria, polydipsia, no heat or cold intolerance. HEMATOLOGICAL: No anemia or easy bruising or bleeding. NEUROLOGIC: No headache, seizures, numbness, tingling or weakness. PSYCHIATRIC: No depression, no loss of interest in normal activity or change in sleep pattern.   All other systems reviewed and are negative Allergies  Review of patient's allergies indicates no known allergies.  Home Medications   Current Outpatient Rx  Name  Route  Sig  Dispense  Refill  . aspirin 81 MG chewable tablet   Oral   Chew 81 mg by mouth daily.         . carvedilol (COREG) 25 MG tablet   Oral   Take 25 mg by mouth 2 (two) times daily with a meal.          . cloNIDine (CATAPRES - DOSED IN MG/24 HR) 0.3 mg/24hr   Transdermal   Place 1 patch onto the skin once a week.           . cloNIDine (CATAPRES) 0.3 MG tablet   Oral   Take 0.3 mg by mouth 3 (three) times daily.          . insulin aspart protamine-insulin aspart (NOVOLOG 70/30) (70-30) 100 UNIT/ML injection   Subcutaneous   Inject 25-35 Units into the skin See admin instructions. Injects 35 units every morning and injects 25 units every evening         . losartan (COZAAR) 100 MG tablet   Oral   Take 100 mg by mouth daily.         Marland Kitchen  lovastatin (MEVACOR) 40 MG tablet   Oral   Take 40 mg by mouth daily.         . multivitamin (RENA-VIT) TABS tablet   Oral   Take 1 tablet by mouth daily.          Marland Kitchen omeprazole (PRILOSEC) 20 MG capsule   Oral   Take 20 mg by mouth daily as needed (for acid reducer).         Marland Kitchen oxyCODONE-acetaminophen (PERCOCET/ROXICET) 5-325 MG per tablet   Oral   Take 1 tablet by mouth every 4 (four) hours as needed for pain.         . pregabalin (LYRICA) 75 MG capsule   Oral   Take 75 mg by mouth 2 (two) times daily.         . sevelamer carbonate (RENVELA) 800 MG tablet   Oral   Take 800-2,400 mg by mouth See admin instructions. Takes 3 tablets with each meal,  and 1 tablet twice daily with snacks           BP 168/92  Pulse 91  Resp 8  SpO2 96%  Physical Exam  Nursing notes reviewed.  Electronic medical record reviewed. VITAL SIGNS:   Filed Vitals:   09/03/12 0545 09/03/12 0600 09/03/12 0615 09/03/12 0630  BP: 159/104 179/94 181/101 177/106  Pulse: 86 85 88 88  Resp: 11 9 14 17   SpO2: 99% 100% 99% 98%   CONSTITUTIONAL: Awake, oriented, appears non-toxic HENT: Atraumatic, normocephalic, oral mucosa pink and moist, airway patent. Nares patent without drainage. External ears normal. EYES: Conjunctiva clear, EOMI, PERRLA NECK: Trachea midline, non-tender, supple CARDIOVASCULAR: Normal heart rate, Normal rhythm, No murmurs, rubs, gallops PULMONARY/CHEST: Clear to auscultation, no rhonchi, wheezes, or rales. Symmetrical breath sounds. Non-tender. ABDOMINAL: Non-distended, soft, non-tender - no rebound or guarding.  BS normal. NEUROLOGIC: Non-focal, moving all four extremities, no gross sensory or motor deficits. EXTREMITIES: No clubbing, cyanosis, or edema. Left-sided fistula with good thrill and bruit SKIN: Warm, Dry, No erythema, No rash  ED Course  Procedures (including critical care time)  Labs Reviewed  CBC WITH DIFFERENTIAL - Abnormal; Notable for the following:    RBC 3.86 (*)    Hemoglobin 11.0 (*)    HCT 32.1 (*)    All other components within normal limits  POCT I-STAT, CHEM 8 - Abnormal; Notable for the following:    BUN 41 (*)    Creatinine, Ser 9.60 (*)    Glucose, Bld 104 (*)    Hemoglobin 11.2 (*)    HCT 33.0 (*)    All other components within normal limits  COMPREHENSIVE METABOLIC PANEL  ACETAMINOPHEN LEVEL   No results found.   1. Opioid overdose   2. End stage renal disease on dialysis       MDM  We'll continue to observe the patient for at least 2 hours and observe for reemergence of opioid toxidrome.  Control nausea. Labs protocol. Acetaminophen level is within normal limits, LFTs are unremarkable.   Labs seem to be at baseline.  Reevaluated over 2 hours after Narcan administration, patient still feels fine, is not feeling sleepy, no evidence for opioid toxidrome at this time.  Discharge the patient home stable and good condition. Will discontinue his Percocet and urged him to take very small doses of Norco I told him that the active metabolites of Percocet are cleared by his kidneys which is why he likely ran into this problem. I do not see any physical signs  of needle sticks or track marks suggestive of heroin use.  Followup with Dr. Rolan Lipa.        Rhunette Croft, MD 09/05/12 [phone]

## 2012-09-03 NOTE — ED Notes (Signed)
Pt presents with profuse, violent vomiting on arrival, per EMS, vomiting began after Narcan admin

## 2012-09-03 NOTE — ED Notes (Signed)
To ED from home via EMS, called for ALOC, CBG 119, per EMS pt was altered with pin point pupils, EMS gave 2mg  Narcan with increased LOC and profuse vomiting, resps increased from 12 to 18, 20g right hand, other vitals stable

## 2012-09-03 NOTE — ED Notes (Signed)
Pt and family deny SI, pt reports taking 2 percocets for leg pain from his script

## 2012-09-04 DIAGNOSIS — D631 Anemia in chronic kidney disease: Secondary | ICD-10-CM | POA: Diagnosis not present

## 2012-09-04 DIAGNOSIS — N186 End stage renal disease: Secondary | ICD-10-CM | POA: Diagnosis not present

## 2012-09-04 DIAGNOSIS — D509 Iron deficiency anemia, unspecified: Secondary | ICD-10-CM | POA: Diagnosis not present

## 2012-09-04 DIAGNOSIS — E1129 Type 2 diabetes mellitus with other diabetic kidney complication: Secondary | ICD-10-CM | POA: Diagnosis not present

## 2012-09-04 DIAGNOSIS — N2581 Secondary hyperparathyroidism of renal origin: Secondary | ICD-10-CM | POA: Diagnosis not present

## 2012-09-06 DIAGNOSIS — N2581 Secondary hyperparathyroidism of renal origin: Secondary | ICD-10-CM | POA: Diagnosis not present

## 2012-09-06 DIAGNOSIS — N186 End stage renal disease: Secondary | ICD-10-CM | POA: Diagnosis not present

## 2012-09-06 DIAGNOSIS — D509 Iron deficiency anemia, unspecified: Secondary | ICD-10-CM | POA: Diagnosis not present

## 2012-09-06 DIAGNOSIS — D631 Anemia in chronic kidney disease: Secondary | ICD-10-CM | POA: Diagnosis not present

## 2012-09-06 DIAGNOSIS — E1129 Type 2 diabetes mellitus with other diabetic kidney complication: Secondary | ICD-10-CM | POA: Diagnosis not present

## 2012-09-08 DIAGNOSIS — N2581 Secondary hyperparathyroidism of renal origin: Secondary | ICD-10-CM | POA: Diagnosis not present

## 2012-09-08 DIAGNOSIS — D631 Anemia in chronic kidney disease: Secondary | ICD-10-CM | POA: Diagnosis not present

## 2012-09-08 DIAGNOSIS — N186 End stage renal disease: Secondary | ICD-10-CM | POA: Diagnosis not present

## 2012-09-08 DIAGNOSIS — D509 Iron deficiency anemia, unspecified: Secondary | ICD-10-CM | POA: Diagnosis not present

## 2012-09-08 DIAGNOSIS — E1129 Type 2 diabetes mellitus with other diabetic kidney complication: Secondary | ICD-10-CM | POA: Diagnosis not present

## 2012-10-07 DIAGNOSIS — N186 End stage renal disease: Secondary | ICD-10-CM | POA: Diagnosis not present

## 2012-10-09 DIAGNOSIS — N2581 Secondary hyperparathyroidism of renal origin: Secondary | ICD-10-CM | POA: Diagnosis not present

## 2012-10-09 DIAGNOSIS — D631 Anemia in chronic kidney disease: Secondary | ICD-10-CM | POA: Diagnosis not present

## 2012-10-09 DIAGNOSIS — N186 End stage renal disease: Secondary | ICD-10-CM | POA: Diagnosis not present

## 2012-10-09 DIAGNOSIS — D509 Iron deficiency anemia, unspecified: Secondary | ICD-10-CM | POA: Diagnosis not present

## 2012-11-06 DIAGNOSIS — N186 End stage renal disease: Secondary | ICD-10-CM | POA: Diagnosis not present

## 2012-11-08 DIAGNOSIS — D509 Iron deficiency anemia, unspecified: Secondary | ICD-10-CM | POA: Diagnosis not present

## 2012-11-08 DIAGNOSIS — N186 End stage renal disease: Secondary | ICD-10-CM | POA: Diagnosis not present

## 2012-11-08 DIAGNOSIS — N2581 Secondary hyperparathyroidism of renal origin: Secondary | ICD-10-CM | POA: Diagnosis not present

## 2012-11-08 DIAGNOSIS — D631 Anemia in chronic kidney disease: Secondary | ICD-10-CM | POA: Diagnosis not present

## 2012-11-29 DIAGNOSIS — E1129 Type 2 diabetes mellitus with other diabetic kidney complication: Secondary | ICD-10-CM | POA: Diagnosis not present

## 2012-12-07 DIAGNOSIS — N186 End stage renal disease: Secondary | ICD-10-CM | POA: Diagnosis not present

## 2012-12-08 DIAGNOSIS — N186 End stage renal disease: Secondary | ICD-10-CM | POA: Diagnosis not present

## 2012-12-08 DIAGNOSIS — D631 Anemia in chronic kidney disease: Secondary | ICD-10-CM | POA: Diagnosis not present

## 2012-12-08 DIAGNOSIS — D509 Iron deficiency anemia, unspecified: Secondary | ICD-10-CM | POA: Diagnosis not present

## 2012-12-08 DIAGNOSIS — N2581 Secondary hyperparathyroidism of renal origin: Secondary | ICD-10-CM | POA: Diagnosis not present

## 2012-12-21 ENCOUNTER — Encounter: Payer: Self-pay | Admitting: Vascular Surgery

## 2012-12-22 ENCOUNTER — Encounter: Payer: Self-pay | Admitting: *Deleted

## 2012-12-22 ENCOUNTER — Ambulatory Visit (INDEPENDENT_AMBULATORY_CARE_PROVIDER_SITE_OTHER): Payer: Medicare Other | Admitting: Vascular Surgery

## 2012-12-22 ENCOUNTER — Encounter: Payer: Self-pay | Admitting: Vascular Surgery

## 2012-12-22 ENCOUNTER — Other Ambulatory Visit: Payer: Self-pay | Admitting: *Deleted

## 2012-12-22 VITALS — BP 132/81 | HR 75 | Ht 71.0 in | Wt 201.0 lb

## 2012-12-22 DIAGNOSIS — T82898A Other specified complication of vascular prosthetic devices, implants and grafts, initial encounter: Secondary | ICD-10-CM | POA: Diagnosis not present

## 2012-12-22 DIAGNOSIS — I999 Unspecified disorder of circulatory system: Secondary | ICD-10-CM | POA: Insufficient documentation

## 2012-12-22 DIAGNOSIS — I729 Aneurysm of unspecified site: Secondary | ICD-10-CM | POA: Insufficient documentation

## 2012-12-22 DIAGNOSIS — N186 End stage renal disease: Secondary | ICD-10-CM

## 2012-12-22 NOTE — Progress Notes (Signed)
[facility] [place]  Referred by:  Louis Meckel, MD [place], [place] [address]  Reason for referral: Evaluate left access  History of Present Illness  [name] is a 57 y.o. (04-17-1956) male who presents for evaluation of left forearm loop AVG.  The patient is right hand dominant.  The patient's previous access procedures: L FA AVG (08/22/08).  Previous central venous cannulation procedures include: none.  The patient has never had a PPM placed.  The patient denies any bleeding complications from the L FA AVG but notes the technicians are afraid to stick the AVG at this point.  Past Medical History  Diagnosis Date  . Renal disorder   . Diabetes mellitus   . Hypertension   . Dialysis care   . Renal failure     Past Surgical History  Procedure Laterality Date  . Arteriovenous graft placement      History   Social History  . Marital Status: Married    Spouse Name: N/A    Number of Children: N/A  . Years of Education: N/A   Occupational History  . Not on file.   Social History Main Topics  . Smoking status: Never Smoker   . Smokeless tobacco: Never Used  . Alcohol Use: No  . Drug Use: No  . Sexual Activity: Not on file   Other Topics Concern  . Not on file   Social History Narrative  . No narrative on file    [facility] Pt denies any significant medical problems in parents  Current Outpatient Prescriptions on File Prior to Visit  Medication Sig Dispense Refill  . aspirin 81 MG chewable tablet Chew 81 mg by mouth daily.      . carvedilol (COREG) 25 MG tablet Take 25 mg by mouth 2 (two) times daily with a meal.       . cloNIDine (CATAPRES - DOSED IN MG/24 HR) 0.3 mg/24hr Place 1 patch onto the skin once a week.        . cloNIDine (CATAPRES) 0.3 MG tablet Take 0.3 mg by mouth 3 (three) times daily.       . insulin aspart protamine-insulin aspart (NOVOLOG 70/30) (70-30) 100 UNIT/ML injection Inject 25-35 Units into the skin See  admin instructions. Injects 35 units every morning and injects 25 units every evening      . losartan (COZAAR) 100 MG tablet Take 100 mg by mouth daily.      Marland Kitchen lovastatin (MEVACOR) 40 MG tablet Take 40 mg by mouth daily.      . multivitamin (RENA-VIT) TABS tablet Take 1 tablet by mouth daily.       Marland Kitchen omeprazole (PRILOSEC) 20 MG capsule Take 20 mg by mouth daily as needed (for acid reducer).      Marland Kitchen oxyCODONE-acetaminophen (PERCOCET/ROXICET) 5-325 MG per tablet Take 1 tablet by mouth every 4 (four) hours as needed for pain.      . pregabalin (LYRICA) 75 MG capsule Take 75 mg by mouth 2 (two) times daily.      . sevelamer carbonate (RENVELA) 800 MG tablet Take 800-2,400 mg by mouth See admin instructions. Takes 3 tablets with each meal, and 1 tablet twice daily with snacks      . HYDROcodone-acetaminophen (NORCO/VICODIN) 5-325 MG per tablet Take 1-2 tablets by mouth every 8 (eight) hours as needed for pain.  17 tablet  0   No current facility-administered medications on file prior to visit.    No Known  Allergies  REVIEW OF SYSTEMS:  (Positives checked otherwise negative)  CARDIOVASCULAR:  []  chest pain, []  chest pressure, []  palpitations, []  shortness of breath when laying flat, []  shortness of breath with exertion,  []  pain in feet when walking, []  pain in feet when laying flat, []  history of blood clot in veins (DVT), []  history of phlebitis, []  swelling in legs, []  varicose veins  PULMONARY:  []  productive cough, []  asthma, []  wheezing  NEUROLOGIC:  []  weakness in arms or legs, []  numbness in arms or legs, []  difficulty speaking or slurred speech, []  temporary loss of vision in one eye, []  dizziness  HEMATOLOGIC:  []  bleeding problems, []  problems with blood clotting too easily  MUSCULOSKEL:  []  joint pain, []  joint swelling  GASTROINTEST:  []  vomiting blood, []  blood in stool     GENITOURINARY:  []  burning with urination, []  blood in urine, [x]  ESRD-HD: M-W-F  PSYCHIATRIC:  []  history  of major depression  INTEGUMENTARY:  []  rashes, []  ulcers  CONSTITUTIONAL:  []  fever, []  chills  Physical Examination  Filed Vitals:   12/22/12 1129  BP: 132/81  Pulse: 75  Height: 5\' 11"  (1.803 m)  Weight: 201 lb (91.173 kg)  SpO2: 100%   Body mass index is 28.05 kg/(m^2).  General: A&O x 3, WDWN  Head: [place]/AT  Ear/Nose/Throat: Hearing grossly intact, nares w/o erythema or drainage, oropharynx w/o Erythema/Exudate, Mallampati score: 3  Eyes: PERRLA, EOMI  Neck: Supple, no nuchal rigidity, no palpable LAD  Pulmonary: Sym exp, good air movt, CTAB, no rales, rhonchi, & wheezing  Cardiac: RRR, Nl S1, S2, no Murmurs, rubs or gallops  Vascular: Vessel Right Left  Radial  Palpable Faintly Palpable  Ulnar Faintly Palpable Not Palpable  Brachial Palpable Palpable  Carotid Palpable, without bruit  Palpable, without bruit  Aorta Not palpable N/A   Gastrointestinal: soft, NTND, -G/R, - HSM, - masses, - CVAT B  Musculoskeletal: M/S 5/5 throughout , Extremities without ischemic changes , L FA AVG: pseudoaneurysm degeneration of both arterial and venous arms, strongly palpable pulse over arterial pseudoaneurysm, with some healing ulcerations, small venous pseudoaneurysm, +thrill, +bruit  Neurologic: CN 2-12 intact , Pain and light touch intact in extremities , Motor exam as listed above  Psychiatric: Judgment intact, Mood & affect appropriate for pt's clinical situation  Dermatologic: See M/S exam for extremity exam, no rashes otherwise noted  Lymph : No Cervical, Axillary, or Inguinal lymphadenopathy   Outside Studies/Documentation 15 pages of outside documents were reviewed including: outpatient labs and progress notes.  Medical Decision Making  [name] is a 57 y.o. male who presents with pseudoaneurysmal degeneration of left FA AVG, ESRD requiring hemodialysis.   After 4 years of use, no unexpectedly the left FA AVG is developing degeneration and now the arterial  arm is at risk for rupture.  I gave the patient instructions in regards to compression of any active bleeding and activation of EMS services.  I recommend: revision of the L FA AVG with interposition graft replacement of the arterial arm.  Eventually the venous arm may also need replacement.  I had an extensive discussion with this patient in regards to the nature of access surgery, including risk, benefits, and alternatives.    The patient is aware that the risks of access surgery include but are not limited to: bleeding, infection, steal syndrome, nerve damage, ischemic monomelic neuropathy, failure of access to mature, and possible need for additional access procedures in the future.  The patient  has agreed to proceed with the above procedure which will be scheduled 26 AUG 14.  Adele Barthel, MD [facility] [place] Office: [phone] Pager: [phone]  12/22/2012, 11:50 AM

## 2012-12-25 ENCOUNTER — Encounter: Payer: Self-pay | Admitting: Nephrology

## 2012-12-27 ENCOUNTER — Encounter (HOSPITAL_COMMUNITY): Payer: Self-pay | Admitting: Pharmacy Technician

## 2013-01-01 ENCOUNTER — Encounter (HOSPITAL_COMMUNITY): Payer: Self-pay | Admitting: *Deleted

## 2013-01-01 MED ORDER — CEFUROXIME SODIUM 1.5 G IJ SOLR
1.5000 g | INTRAMUSCULAR | Status: AC
  Administered 2013-01-02: 1.5 g via INTRAVENOUS
  Filled 2013-01-01: qty 1.5

## 2013-01-02 ENCOUNTER — Ambulatory Visit (HOSPITAL_COMMUNITY): Payer: Medicare Other | Admitting: Anesthesiology

## 2013-01-02 ENCOUNTER — Ambulatory Visit (HOSPITAL_COMMUNITY): Payer: Medicare Other

## 2013-01-02 ENCOUNTER — Encounter (HOSPITAL_COMMUNITY): Payer: Self-pay | Admitting: Anesthesiology

## 2013-01-02 ENCOUNTER — Ambulatory Visit (HOSPITAL_COMMUNITY)
Admission: RE | Admit: 2013-01-02 | Discharge: 2013-01-02 | Disposition: A | Discharge status: Home / Self Care | Payer: Medicare Other | Source: Ambulatory Visit | Attending: Vascular Surgery | Admitting: Vascular Surgery

## 2013-01-02 ENCOUNTER — Telehealth: Payer: Self-pay | Admitting: Vascular Surgery

## 2013-01-02 ENCOUNTER — Encounter (HOSPITAL_COMMUNITY)
Admission: RE | Disposition: A | Discharge status: Home / Self Care | Payer: Self-pay | Source: Ambulatory Visit | Attending: Vascular Surgery

## 2013-01-02 DIAGNOSIS — I729 Aneurysm of unspecified site: Secondary | ICD-10-CM

## 2013-01-02 DIAGNOSIS — I721 Aneurysm of artery of upper extremity: Secondary | ICD-10-CM | POA: Insufficient documentation

## 2013-01-02 DIAGNOSIS — I120 Hypertensive chronic kidney disease with stage 5 chronic kidney disease or end stage renal disease: Secondary | ICD-10-CM | POA: Insufficient documentation

## 2013-01-02 DIAGNOSIS — Z992 Dependence on renal dialysis: Secondary | ICD-10-CM | POA: Diagnosis not present

## 2013-01-02 DIAGNOSIS — Z01818 Encounter for other preprocedural examination: Secondary | ICD-10-CM | POA: Diagnosis not present

## 2013-01-02 DIAGNOSIS — I10 Essential (primary) hypertension: Secondary | ICD-10-CM | POA: Diagnosis not present

## 2013-01-02 DIAGNOSIS — Z794 Long term (current) use of insulin: Secondary | ICD-10-CM | POA: Diagnosis not present

## 2013-01-02 DIAGNOSIS — Z79899 Other long term (current) drug therapy: Secondary | ICD-10-CM | POA: Diagnosis not present

## 2013-01-02 DIAGNOSIS — T82898A Other specified complication of vascular prosthetic devices, implants and grafts, initial encounter: Secondary | ICD-10-CM | POA: Diagnosis not present

## 2013-01-02 DIAGNOSIS — Y832 Surgical operation with anastomosis, bypass or graft as the cause of abnormal reaction of the patient, or of later complication, without mention of misadventure at the time of the procedure: Secondary | ICD-10-CM | POA: Insufficient documentation

## 2013-01-02 DIAGNOSIS — N186 End stage renal disease: Secondary | ICD-10-CM | POA: Insufficient documentation

## 2013-01-02 DIAGNOSIS — E119 Type 2 diabetes mellitus without complications: Secondary | ICD-10-CM | POA: Insufficient documentation

## 2013-01-02 DIAGNOSIS — I509 Heart failure, unspecified: Secondary | ICD-10-CM | POA: Diagnosis not present

## 2013-01-02 HISTORY — DX: Gastro-esophageal reflux disease without esophagitis: K21.9

## 2013-01-02 HISTORY — DX: Accidental discharge from unspecified firearms or gun, initial encounter: W34.00XA

## 2013-01-02 HISTORY — DX: Cardiac arrhythmia, unspecified: I49.9

## 2013-01-02 HISTORY — PX: REVISION OF ARTERIOVENOUS GORETEX GRAFT: SHX6073

## 2013-01-02 HISTORY — DX: Heart failure, unspecified: I50.9

## 2013-01-02 LAB — GLUCOSE, CAPILLARY
Glucose-Capillary: 154 mg/dL — ABNORMAL HIGH (ref 70–99)
Glucose-Capillary: 166 mg/dL — ABNORMAL HIGH (ref 70–99)

## 2013-01-02 LAB — POCT I-STAT 4, (NA,K, GLUC, HGB,HCT)
Glucose, Bld: 171 mg/dL — ABNORMAL HIGH (ref 70–99)
HCT: 38 % — ABNORMAL LOW (ref 39.0–52.0)
Hemoglobin: 12.9 g/dL — ABNORMAL LOW (ref 13.0–17.0)
Potassium: 4.4 mEq/L (ref 3.5–5.1)
Sodium: 136 mEq/L (ref 135–145)

## 2013-01-02 SURGERY — REVISION OF ARTERIOVENOUS GORETEX GRAFT
Anesthesia: Monitor Anesthesia Care | Site: Arm Lower | Laterality: Left | Wound class: Clean

## 2013-01-02 MED ORDER — LIDOCAINE-EPINEPHRINE (PF) 1 %-1:200000 IJ SOLN
INTRAMUSCULAR | Status: AC
  Filled 2013-01-02: qty 10

## 2013-01-02 MED ORDER — HEPARIN SODIUM (PORCINE) 1000 UNIT/ML IJ SOLN
INTRAMUSCULAR | Status: DC | PRN
  Administered 2013-01-02: 7000 [IU] via INTRAVENOUS

## 2013-01-02 MED ORDER — HYDROMORPHONE HCL PF 1 MG/ML IJ SOLN: 0.2500 mg | INTRAMUSCULAR | Status: DC | PRN

## 2013-01-02 MED ORDER — MEPERIDINE HCL 25 MG/ML IJ SOLN: 6.2500 mg | INTRAMUSCULAR | Status: DC | PRN

## 2013-01-02 MED ORDER — ONDANSETRON HCL 4 MG/2ML IJ SOLN
4.0000 mg | Freq: Once | INTRAMUSCULAR | Status: AC | PRN
  Administered 2013-01-02: 4 mg via INTRAVENOUS

## 2013-01-02 MED ORDER — DEXTROSE 5 % IV SOLN
INTRAVENOUS | Status: DC | PRN
  Administered 2013-01-02: 10:00:00 via INTRAVENOUS

## 2013-01-02 MED ORDER — PROTAMINE SULFATE 10 MG/ML IV SOLN
INTRAVENOUS | Status: DC | PRN
  Administered 2013-01-02: 15 mg via INTRAVENOUS

## 2013-01-02 MED ORDER — SODIUM CHLORIDE 0.9 % IV SOLN: INTRAVENOUS | Status: DC

## 2013-01-02 MED ORDER — LIDOCAINE-EPINEPHRINE (PF) 1 %-1:200000 IJ SOLN
INTRAMUSCULAR | Status: DC | PRN
  Administered 2013-01-02: 15 mL via INTRADERMAL

## 2013-01-02 MED ORDER — OXYCODONE HCL 5 MG/5ML PO SOLN: 5.0000 mg | Freq: Once | ORAL | Status: DC | PRN

## 2013-01-02 MED ORDER — ONDANSETRON HCL 4 MG/2ML IJ SOLN
INTRAMUSCULAR | Status: AC
  Filled 2013-01-02: qty 2

## 2013-01-02 MED ORDER — THROMBIN 20000 UNITS EX SOLR
CUTANEOUS | Status: AC
  Filled 2013-01-02: qty 20000

## 2013-01-02 MED ORDER — SODIUM CHLORIDE 0.9 % IV SOLN
INTRAVENOUS | Status: DC | PRN
  Administered 2013-01-02 (×2): via INTRAVENOUS

## 2013-01-02 MED ORDER — PROPOFOL INFUSION 10 MG/ML OPTIME
INTRAVENOUS | Status: DC | PRN
  Administered 2013-01-02: 25 ug/kg/min via INTRAVENOUS

## 2013-01-02 MED ORDER — OXYCODONE-ACETAMINOPHEN 5-325 MG PO TABS: 1.0000 | ORAL_TABLET | ORAL | Status: DC | PRN

## 2013-01-02 MED ORDER — OXYCODONE HCL 5 MG PO TABS: 5.0000 mg | ORAL_TABLET | Freq: Once | ORAL | Status: DC | PRN

## 2013-01-02 MED ORDER — BUPIVACAINE HCL (PF) 0.5 % IJ SOLN
INTRAMUSCULAR | Status: AC
  Filled 2013-01-02: qty 30

## 2013-01-02 MED ORDER — FENTANYL CITRATE 0.05 MG/ML IJ SOLN
INTRAMUSCULAR | Status: DC | PRN
  Administered 2013-01-02: 75 ug via INTRAVENOUS
  Administered 2013-01-02: 25 ug via INTRAVENOUS
  Administered 2013-01-02: 50 ug via INTRAVENOUS

## 2013-01-02 MED ORDER — SODIUM CHLORIDE 0.9 % IR SOLN
Status: DC | PRN
  Administered 2013-01-02: 11:00:00

## 2013-01-02 MED ORDER — MIDAZOLAM HCL 5 MG/5ML IJ SOLN
INTRAMUSCULAR | Status: DC | PRN
  Administered 2013-01-02: 1 mg via INTRAVENOUS
  Administered 2013-01-02: 0.5 mg via INTRAVENOUS

## 2013-01-02 MED ORDER — BUPIVACAINE HCL (PF) 0.5 % IJ SOLN
INTRAMUSCULAR | Status: DC | PRN
  Administered 2013-01-02: 15 mL

## 2013-01-02 MED ORDER — ONDANSETRON HCL 4 MG/2ML IJ SOLN
INTRAMUSCULAR | Status: DC | PRN
  Administered 2013-01-02: 4 mg via INTRAVENOUS

## 2013-01-02 MED ORDER — 0.9 % SODIUM CHLORIDE (POUR BTL) OPTIME
TOPICAL | Status: DC | PRN
  Administered 2013-01-02: 1000 mL

## 2013-01-02 MED ORDER — LACTATED RINGERS IV SOLN: INTRAVENOUS | Status: DC

## 2013-01-02 SURGICAL SUPPLY — 45 items
ADH SKN CLS APL DERMABOND .7 (GAUZE/BANDAGES/DRESSINGS) ×1
BANDAGE ESMARK 6X9 LF (GAUZE/BANDAGES/DRESSINGS) IMPLANT
BNDG CMPR 9X6 STRL LF SNTH (GAUZE/BANDAGES/DRESSINGS) ×1
BNDG ESMARK 6X9 LF (GAUZE/BANDAGES/DRESSINGS) ×2
CANISTER SUCTION 2500CC (MISCELLANEOUS) ×2 IMPLANT
CATH EMB 3FR 40CM (CATHETERS) ×1 IMPLANT
CLIP TI MEDIUM 6 (CLIP) ×2 IMPLANT
CLIP TI WIDE RED SMALL 6 (CLIP) ×2 IMPLANT
CLOTH BEACON ORANGE TIMEOUT ST (SAFETY) ×2 IMPLANT
COVER SURGICAL LIGHT HANDLE (MISCELLANEOUS) ×2 IMPLANT
CUFF TOURNIQUET SINGLE 24IN (TOURNIQUET CUFF) ×1 IMPLANT
DECANTER SPIKE VIAL GLASS SM (MISCELLANEOUS) ×2 IMPLANT
DERMABOND ADVANCED (GAUZE/BANDAGES/DRESSINGS) ×1
DERMABOND ADVANCED .7 DNX12 (GAUZE/BANDAGES/DRESSINGS) ×1 IMPLANT
ELECT REM PT RETURN 9FT ADLT (ELECTROSURGICAL) ×2
ELECTRODE REM PT RTRN 9FT ADLT (ELECTROSURGICAL) ×1 IMPLANT
GLOVE BIO SURGEON STRL SZ7 (GLOVE) ×2 IMPLANT
GLOVE BIOGEL PI IND STRL 7.5 (GLOVE) ×1 IMPLANT
GLOVE BIOGEL PI INDICATOR 7.5 (GLOVE) ×2
GLOVE SS BIOGEL STRL SZ 7 (GLOVE) IMPLANT
GLOVE SUPERSENSE BIOGEL SZ 7 (GLOVE) ×1
GOWN STRL NON-REIN LRG LVL3 (GOWN DISPOSABLE) ×5 IMPLANT
GOWN STRL REIN XL XLG (GOWN DISPOSABLE) ×2 IMPLANT
GRAFT GORETEX STND 6X20 (Vascular Products) ×2 IMPLANT
GRAFT GORETEXSTD 6X20 (Vascular Products) IMPLANT
KIT BASIN OR (CUSTOM PROCEDURE TRAY) ×2 IMPLANT
KIT ROOM TURNOVER OR (KITS) ×2 IMPLANT
NDL HYPO 25GX1X1/2 BEV (NEEDLE) ×1 IMPLANT
NEEDLE HYPO 25GX1X1/2 BEV (NEEDLE) ×2 IMPLANT
NS IRRIG 1000ML POUR BTL (IV SOLUTION) ×2 IMPLANT
PACK CV ACCESS (CUSTOM PROCEDURE TRAY) ×2 IMPLANT
PAD ARMBOARD 7.5X6 YLW CONV (MISCELLANEOUS) ×4 IMPLANT
SPONGE SURGIFOAM ABS GEL 100 (HEMOSTASIS) IMPLANT
SUT GORETEX 6.0 TT9 (SUTURE) ×4 IMPLANT
SUT MNCRL AB 4-0 PS2 18 (SUTURE) ×3 IMPLANT
SUT PROLENE 6 0 BV (SUTURE) ×4 IMPLANT
SUT PROLENE 7 0 BV 1 (SUTURE) IMPLANT
SUT VIC AB 3-0 SH 27 (SUTURE) ×4
SUT VIC AB 3-0 SH 27X BRD (SUTURE) ×2 IMPLANT
SUT VIC AB 3-0 X1 27 (SUTURE) IMPLANT
SUT VIC AB 4-0 PS2 27 (SUTURE) IMPLANT
TOWEL OR 17X24 6PK STRL BLUE (TOWEL DISPOSABLE) ×2 IMPLANT
TOWEL OR 17X26 10 PK STRL BLUE (TOWEL DISPOSABLE) ×2 IMPLANT
UNDERPAD 30X30 INCONTINENT (UNDERPADS AND DIAPERS) ×2 IMPLANT
WATER STERILE IRR 1000ML POUR (IV SOLUTION) ×2 IMPLANT

## 2013-01-02 NOTE — Transfer of Care (Signed)
Immediate Anesthesia Transfer of Care Note  Patient: [name]  Procedure(s) Performed: Procedure(s): REVISION OF ARTERIOVENOUS GORETEX GRAFT (Left)  Patient Location: PACU  Anesthesia Type:MAC  Level of Consciousness: awake, alert , oriented and patient cooperative  Airway & Oxygen Therapy: Patient Spontanous Breathing and room  air  Post-op Assessment: Report given to PACU RN, Post -op Vital signs reviewed and stable and Patient moving all extremities  Post vital signs: Reviewed and stable  Complications: No apparent anesthesia complications

## 2013-01-02 NOTE — Anesthesia Postprocedure Evaluation (Signed)
Anesthesia Post Note  Patient: [name]  Procedure(s) Performed: Procedure(s) (LRB): REVISION OF ARTERIOVENOUS GORETEX GRAFT (Left)  Anesthesia type: general  Patient location: PACU  Post pain: Pain level controlled  Post assessment: Patient's Cardiovascular Status Stable  Last Vitals:  Filed Vitals:   01/02/13 1400  BP:   Pulse: 63  Temp:   Resp:     Post vital signs: Reviewed and stable  Level of consciousness: sedated  Complications: No apparent anesthesia complications

## 2013-01-02 NOTE — Preoperative (Signed)
Beta Blockers   Reason not to administer Beta Blockers:Coreg this morning with sip of water

## 2013-01-02 NOTE — Op Note (Signed)
OPERATIVE NOTE   PROCEDURE: 1. Revision of left forearm arteriovenous graft with interposition graft  PRE-OPERATIVE DIAGNOSIS: left forearm arteriovenous graft pseudoaneurysm  POST-OPERATIVE DIAGNOSIS: same as above   SURGEON: Adele Barthel, MD  ASSISTANT(S): Gerri Lins, PAC   ANESTHESIA: local and MAC  ESTIMATED BLOOD LOSS: 50 cc  FINDING(S): 1. Degenerative graft 2. Palpable graft pulse throughout with a palpable thrill in the venous arm  SPECIMEN(S):  none  INDICATIONS:   [name] is a 57 y.o. male who presents with pulsatile left forearm arteriovenous graft pseudoaneurysm at risk for bleeding.  I recommended replacement of the arterial arm of his forearm arteriovenous graft to try to salvage his forearm arteriovenous graft.  Risk, benefits, and alternatives to access surgery were discussed.  The patient is aware the risks include but are not limited to: bleeding, infection, steal syndrome, nerve damage, ischemic monomelic neuropathy, failure to mature, need for additional procedures, death and stroke.  The patient agrees to proceed forward with the procedure.  DESCRIPTION: After obtaining full informed written consent, the patient was brought back to the operating room and placed supine upon the operating table.  The patient received IV antibiotics prior to induction.  After obtaining adequate anesthesia, the patient was prepped and draped in the standard fashion for: left arm access procedure.  I interrogated the graft with a Sonosite and marked the locations of the arterial anastomosis and apex of the graft.  I injected a total 45 cc of a 1:1 mixture of 0.5% Marcaine without epinephrine and 1% lidocaine with epinephrine at these two segments and along the route for the new interposition segment.  I made an longitudinal incision over the proximal segment and distal segment of the arterial arm of the graft.  I dissected out the graft with electrocautery.  During dissection of  the distal segment, the patient began bleeding vigorously from needle holes in the graft despite clamping the arterial end of the graft and compressing the venous end of the graft.  I subsequently held pressure to the holes and obtained a sterile tourniquet.  The patient was given 7000 units of Heparin intravenously, which was a therapeutic bolus.  My PA placed the tourniquet around the upper arm.  I then exsanguinated the left arm with an Esmark bandage and inflated the tourniquet to 250 mm Hg.  I then quickly dissected out the distal segment of the arterial arm of the graft.  I then clamped the graft proximally and distally.  I clamped the graft proximally and distally.  I transected the graft distal to the proximal segment and proximal to the distal segment to isolate the pseudoaneurysmal segment.  I drained all the blood out of this segment with compression.  I then anesthestized the curvilinear route for the new segment of graft.   I tunneled from the proximal incision to the distal with a curved tunneler.  I then passed a 6 mm Goretex graft through the tunnel and removed the tunnel, leaving the graft in place.  In the proximal incision, I spatulated the old graft and new graft to facilitate an end-to-end anastomosis.  I tried to sew these together, but the old graft continued to tear through the stitch despite taking large bites.  I retransected the old and new graft and sewed the two graft together with a parachute technique with a CV-6 stitch.  I moved the clamp distally in the incision.  A bleeding point was repaired with a figure of eight stitch of CV-6.  I placed thrombin and gelfoam in the incision.  I turned by attention to the distal incision.  I spatulated the old and end graft.  I sewed these two grafts together with in an end-to-end configuration with a running stitch of CV-6.  Prior to completing this anastomosis, I allowed both end to bleed.  The proximal end had pulsatile bleeding but the distal  bleeding was sluggish.  I obtained a 3 Fogarty balloon and passed it distally, obtaining no thrombus.  I completed the anastomosis in the usual fashion.  I placed thrombin and gelfoam in the incision and removed all clamps.   I gave the patient 15 mg of Protamine to obtain reversal of anticoagulation.  After waiting a few minutes, no further active bleeding was noted.  I reapproximated the subcutaneous tissue in both incisions with a running stitch of 3-0 Vicryl.  The skin was reapproximated in both incisions with a running stitch of 4-0 Monocryl.  The skin was cleaned, dried, and Dermabond was applied to reinforce the skin repair.  At the end of the case, there was a palpable graft pulse through the graft, with a palpable thrill in the venous arm.  COMPLICATIONS: none  CONDITION: stable  Adele Barthel, MD [facility] [place] Office: [phone] Pager: [phone]  01/02/2013, 11:40 AM

## 2013-01-02 NOTE — Anesthesia Preprocedure Evaluation (Addendum)
Anesthesia Evaluation  Patient identified by MRN, date of birth, ID band Patient awake    Reviewed: Allergy & Precautions, H&P , NPO status , Patient's Chart, lab work & pertinent test results, reviewed documented beta blocker date and time   Airway Mallampati: I TM Distance: >3 FB   Mouth opening: Limited Mouth Opening  Dental  (+) Teeth Intact, Missing and Dental Advisory Given   Pulmonary          Cardiovascular hypertension, Pt. on home beta blockers and Pt. on medications +CHF + dysrhythmias     Neuro/Psych    GI/Hepatic   Endo/Other  diabetes, Type 2, Insulin Dependent  Renal/GU ESRF and DialysisRenal diseaseM-W-Fri     Musculoskeletal   Abdominal   Peds  Hematology   Anesthesia Other Findings   Reproductive/Obstetrics                          Anesthesia Physical Anesthesia Plan  ASA: III  Anesthesia Plan: MAC   Post-op Pain Management:    Induction: Intravenous  Airway Management Planned: Simple Face Mask  Additional Equipment:   Intra-op Plan:   Post-operative Plan:   Informed Consent: I have reviewed the patients History and Physical, chart, labs and discussed the procedure including the risks, benefits and alternatives for the proposed anesthesia with the patient or authorized representative who has indicated his/her understanding and acceptance.     Plan Discussed with: CRNA and Surgeon  Anesthesia Plan Comments:         Anesthesia Quick Evaluation

## 2013-01-02 NOTE — H&P (View-Only) (Signed)
[facility] [place]  Referred by:  Louis Meckel, MD [place], [place] [id number]  Reason for referral: Evaluate left access  History of Present Illness  [name] is a 57 y.o. ([birth year]) male who presents for evaluation of left forearm loop AVG.  The patient is right hand dominant.  The patient's previous access procedures: L FA AVG (08/22/08).  Previous central venous cannulation procedures include: none.  The patient has never had a PPM placed.  The patient denies any bleeding complications from the L FA AVG but notes the technicians are afraid to stick the AVG at this point.  Past Medical History  Diagnosis Date  . Renal disorder   . Diabetes mellitus   . Hypertension   . Dialysis care   . Renal failure     Past Surgical History  Procedure Laterality Date  . Arteriovenous graft placement      History   Social History  . Marital Status: Married    Spouse Name: N/A    Number of Children: N/A  . Years of Education: N/A   Occupational History  . Not on file.   Social History Main Topics  . Smoking status: Never Smoker   . Smokeless tobacco: Never Used  . Alcohol Use: No  . Drug Use: No  . Sexual Activity: Not on file   Other Topics Concern  . Not on file   Social History Narrative  . No narrative on file    [facility] Pt denies any significant medical problems in parents  Current Outpatient Prescriptions on File Prior to Visit  Medication Sig Dispense Refill  . aspirin 81 MG chewable tablet Chew 81 mg by mouth daily.      . carvedilol (COREG) 25 MG tablet Take 25 mg by mouth 2 (two) times daily with a meal.       . cloNIDine (CATAPRES - DOSED IN MG/24 HR) 0.3 mg/24hr Place 1 patch onto the skin once a week.        . cloNIDine (CATAPRES) 0.3 MG tablet Take 0.3 mg by mouth 3 (three) times daily.       . insulin aspart protamine-insulin aspart (NOVOLOG 70/30) (70-30) 100 UNIT/ML injection Inject 25-35 Units into the skin See  admin instructions. Injects 35 units every morning and injects 25 units every evening      . losartan (COZAAR) 100 MG tablet Take 100 mg by mouth daily.      Marland Kitchen lovastatin (MEVACOR) 40 MG tablet Take 40 mg by mouth daily.      . multivitamin (RENA-VIT) TABS tablet Take 1 tablet by mouth daily.       Marland Kitchen omeprazole (PRILOSEC) 20 MG capsule Take 20 mg by mouth daily as needed (for acid reducer).      Marland Kitchen oxyCODONE-acetaminophen (PERCOCET/ROXICET) 5-325 MG per tablet Take 1 tablet by mouth every 4 (four) hours as needed for pain.      . pregabalin (LYRICA) 75 MG capsule Take 75 mg by mouth 2 (two) times daily.      . sevelamer carbonate (RENVELA) 800 MG tablet Take 800-2,400 mg by mouth See admin instructions. Takes 3 tablets with each meal, and 1 tablet twice daily with snacks      . HYDROcodone-acetaminophen (NORCO/VICODIN) 5-325 MG per tablet Take 1-2 tablets by mouth every 8 (eight) hours as needed for pain.  17 tablet  0   No current facility-administered medications on file prior to visit.    No Known  Allergies  REVIEW OF SYSTEMS:  (Positives checked otherwise negative)  CARDIOVASCULAR:  []  chest pain, []  chest pressure, []  palpitations, []  shortness of breath when laying flat, []  shortness of breath with exertion,  []  pain in feet when walking, []  pain in feet when laying flat, []  history of blood clot in veins (DVT), []  history of phlebitis, []  swelling in legs, []  varicose veins  PULMONARY:  []  productive cough, []  asthma, []  wheezing  NEUROLOGIC:  []  weakness in arms or legs, []  numbness in arms or legs, []  difficulty speaking or slurred speech, []  temporary loss of vision in one eye, []  dizziness  HEMATOLOGIC:  []  bleeding problems, []  problems with blood clotting too easily  MUSCULOSKEL:  []  joint pain, []  joint swelling  GASTROINTEST:  []  vomiting blood, []  blood in stool     GENITOURINARY:  []  burning with urination, []  blood in urine, [x]  ESRD-HD: M-W-F  PSYCHIATRIC:  []  history  of major depression  INTEGUMENTARY:  []  rashes, []  ulcers  CONSTITUTIONAL:  []  fever, []  chills  Physical Examination  Filed Vitals:   12/22/12 1129  BP: 132/81  Pulse: 75  Height: 5\' 11"  (1.803 m)  Weight: 201 lb (91.173 kg)  SpO2: 100%   Body mass index is 28.05 kg/(m^2).  General: A&O x 3, WDWN  Head: [place]/AT  Ear/Nose/Throat: Hearing grossly intact, nares w/o erythema or drainage, oropharynx w/o Erythema/Exudate, Mallampati score: 3  Eyes: PERRLA, EOMI  Neck: Supple, no nuchal rigidity, no palpable LAD  Pulmonary: Sym exp, good air movt, CTAB, no rales, rhonchi, & wheezing  Cardiac: RRR, Nl S1, S2, no Murmurs, rubs or gallops  Vascular: Vessel Right Left  Radial  Palpable Faintly Palpable  Ulnar Faintly Palpable Not Palpable  Brachial Palpable Palpable  Carotid Palpable, without bruit  Palpable, without bruit  Aorta Not palpable N/A   Gastrointestinal: soft, NTND, -G/R, - HSM, - masses, - CVAT B  Musculoskeletal: M/S 5/5 throughout , Extremities without ischemic changes , L FA AVG: pseudoaneurysm degeneration of both arterial and venous arms, strongly palpable pulse over arterial pseudoaneurysm, with some healing ulcerations, small venous pseudoaneurysm, +thrill, +bruit  Neurologic: CN 2-12 intact , Pain and light touch intact in extremities , Motor exam as listed above  Psychiatric: Judgment intact, Mood & affect appropriate for pt's clinical situation  Dermatologic: See M/S exam for extremity exam, no rashes otherwise noted  Lymph : No Cervical, Axillary, or Inguinal lymphadenopathy   Outside Studies/Documentation 15 pages of outside documents were reviewed including: outpatient labs and progress notes.  Medical Decision Making  [name] is a 57 y.o. male who presents with pseudoaneurysmal degeneration of left FA AVG, ESRD requiring hemodialysis.   After 4 years of use, no unexpectedly the left FA AVG is developing degeneration and now the arterial  arm is at risk for rupture.  I gave the patient instructions in regards to compression of any active bleeding and activation of EMS services.  I recommend: revision of the L FA AVG with interposition graft replacement of the arterial arm.  Eventually the venous arm may also need replacement.  I had an extensive discussion with this patient in regards to the nature of access surgery, including risk, benefits, and alternatives.    The patient is aware that the risks of access surgery include but are not limited to: bleeding, infection, steal syndrome, nerve damage, ischemic monomelic neuropathy, failure of access to mature, and possible need for additional access procedures in the future.  The patient  has agreed to proceed with the above procedure which will be scheduled 26 AUG 14.  Adele Barthel, MD [facility] [place] Office: [phone] Pager: [phone]  12/22/2012, 11:50 AM

## 2013-01-02 NOTE — Interval H&P Note (Signed)
[facility] [place]  History and Physical Update  The patient was interviewed and re-examined.  The patient's previous History and Physical has been reviewed and is unchanged.  There is no change in the plan of care: revision of L FA AVG with interposition graft.  Adele Barthel, MD [facility] [place] Office: [phone] Pager: [phone]  01/02/2013, 7:22 AM

## 2013-01-02 NOTE — Telephone Encounter (Addendum)
Message copied by Gena Fray on Tue Jan 02, 2013  1:48 PM ------      Message from: Alfonso Patten      Created: Tue Jan 02, 2013 12:55 PM                   ----- Message -----         From: Conrad Lakeland, MD         Sent: 01/02/2013  12:00 PM           To: Patrici Ranks, Alfonso Patten, RN            LEGRAND WANNAMAKER      AM:[id number]      06/12/1955            PROCEDURE:      Revision of left forearm arteriovenous graft with interposition graft            Asst: Gerri Lins, [facility]             Follow-up: 4 weeks       ------  01/02/13: spoke with pts family member to notify- pt has dialysis on Fridays and may be running a little late for his 10:15 appointment. I advised pt to just get here as soon as possible after completing his treatment, dpm

## 2013-01-03 ENCOUNTER — Encounter (HOSPITAL_COMMUNITY): Payer: Self-pay | Admitting: Vascular Surgery

## 2013-01-07 DIAGNOSIS — N186 End stage renal disease: Secondary | ICD-10-CM | POA: Diagnosis not present

## 2013-01-08 DIAGNOSIS — D509 Iron deficiency anemia, unspecified: Secondary | ICD-10-CM | POA: Diagnosis not present

## 2013-01-08 DIAGNOSIS — N2581 Secondary hyperparathyroidism of renal origin: Secondary | ICD-10-CM | POA: Diagnosis not present

## 2013-01-08 DIAGNOSIS — D631 Anemia in chronic kidney disease: Secondary | ICD-10-CM | POA: Diagnosis not present

## 2013-01-08 DIAGNOSIS — Z23 Encounter for immunization: Secondary | ICD-10-CM | POA: Diagnosis not present

## 2013-01-08 DIAGNOSIS — N186 End stage renal disease: Secondary | ICD-10-CM | POA: Diagnosis not present

## 2013-01-16 ENCOUNTER — Telehealth: Payer: Self-pay

## 2013-01-16 NOTE — Telephone Encounter (Signed)
Rec'd phone call from [facility] re: question of when the AVG can be cannulated; states it is very swollen at the arterial side.  Will request further direction from Dr. Bridgett Larsson and call dialysis center back with information. '

## 2013-01-17 NOTE — Telephone Encounter (Signed)
Per Dr. Bridgett Larsson, schedule pt. for office visit for evaluation.  Called the Dialysis center; spoke with Tamira.  Advised to encourage pt. to elevate the arm intermittently.  Recommended to avoid sticking AVG site while area is still tender/ swollen.  Offered to schedule appt. with Dr. Bridgett Larsson either 9/12 or 9/19.  Per Tamira, pt's left arm is less swollen and less tender today.  Stated she will use catheter, and let the AVG rest until next week.  States she will call next week for an appt., if pt's symptoms worsen.

## 2013-01-31 DIAGNOSIS — E11319 Type 2 diabetes mellitus with unspecified diabetic retinopathy without macular edema: Secondary | ICD-10-CM | POA: Diagnosis present

## 2013-01-31 DIAGNOSIS — E1129 Type 2 diabetes mellitus with other diabetic kidney complication: Secondary | ICD-10-CM | POA: Diagnosis not present

## 2013-01-31 DIAGNOSIS — I517 Cardiomegaly: Secondary | ICD-10-CM | POA: Diagnosis not present

## 2013-01-31 DIAGNOSIS — G909 Disorder of the autonomic nervous system, unspecified: Secondary | ICD-10-CM | POA: Diagnosis not present

## 2013-01-31 DIAGNOSIS — Z6827 Body mass index (BMI) 27.0-27.9, adult: Secondary | ICD-10-CM | POA: Diagnosis not present

## 2013-01-31 DIAGNOSIS — Z538 Procedure and treatment not carried out for other reasons: Secondary | ICD-10-CM | POA: Diagnosis not present

## 2013-01-31 DIAGNOSIS — I120 Hypertensive chronic kidney disease with stage 5 chronic kidney disease or end stage renal disease: Secondary | ICD-10-CM | POA: Diagnosis not present

## 2013-01-31 DIAGNOSIS — N2581 Secondary hyperparathyroidism of renal origin: Secondary | ICD-10-CM | POA: Diagnosis present

## 2013-01-31 DIAGNOSIS — N058 Unspecified nephritic syndrome with other morphologic changes: Secondary | ICD-10-CM | POA: Diagnosis not present

## 2013-01-31 DIAGNOSIS — Z992 Dependence on renal dialysis: Secondary | ICD-10-CM | POA: Diagnosis not present

## 2013-01-31 DIAGNOSIS — N186 End stage renal disease: Secondary | ICD-10-CM | POA: Diagnosis not present

## 2013-01-31 DIAGNOSIS — E1139 Type 2 diabetes mellitus with other diabetic ophthalmic complication: Secondary | ICD-10-CM | POA: Diagnosis not present

## 2013-01-31 DIAGNOSIS — E46 Unspecified protein-calorie malnutrition: Secondary | ICD-10-CM | POA: Diagnosis present

## 2013-01-31 DIAGNOSIS — H3500 Unspecified background retinopathy: Secondary | ICD-10-CM | POA: Diagnosis not present

## 2013-01-31 DIAGNOSIS — E1142 Type 2 diabetes mellitus with diabetic polyneuropathy: Secondary | ICD-10-CM | POA: Diagnosis present

## 2013-01-31 DIAGNOSIS — E8809 Other disorders of plasma-protein metabolism, not elsewhere classified: Secondary | ICD-10-CM | POA: Diagnosis present

## 2013-01-31 DIAGNOSIS — Z794 Long term (current) use of insulin: Secondary | ICD-10-CM | POA: Diagnosis not present

## 2013-01-31 DIAGNOSIS — E1149 Type 2 diabetes mellitus with other diabetic neurological complication: Secondary | ICD-10-CM | POA: Diagnosis not present

## 2013-01-31 DIAGNOSIS — Z96659 Presence of unspecified artificial knee joint: Secondary | ICD-10-CM | POA: Diagnosis not present

## 2013-01-31 DIAGNOSIS — D631 Anemia in chronic kidney disease: Secondary | ICD-10-CM | POA: Diagnosis present

## 2013-02-02 ENCOUNTER — Encounter: Payer: Self-pay | Admitting: Vascular Surgery

## 2013-02-02 ENCOUNTER — Encounter: Payer: Medicare Other | Admitting: Vascular Surgery

## 2013-02-02 ENCOUNTER — Ambulatory Visit (INDEPENDENT_AMBULATORY_CARE_PROVIDER_SITE_OTHER): Payer: Self-pay | Admitting: Vascular Surgery

## 2013-02-02 VITALS — BP 147/89 | HR 75 | Ht 71.0 in | Wt 199.2 lb

## 2013-02-02 DIAGNOSIS — I729 Aneurysm of unspecified site: Secondary | ICD-10-CM

## 2013-02-02 DIAGNOSIS — N186 End stage renal disease: Secondary | ICD-10-CM

## 2013-02-02 NOTE — Progress Notes (Signed)
[facility] [place]  Postoperative Access Visit  History of Present Illness  [name] is a 57 y.o. year old male who presents for postoperative follow-up for: Revision L FA AVG w/ interposition graft (Date: 01/02/13).  The patient's wounds are healed.  The patient notes no steal symptoms.  The patient is  able to complete their activities of daily living.  The patient's current symptoms are: none.  The HD center has already started sticking the new segment of graft  For VQI Use Only  PRE-ADM LIVING: Home  AMB STATUS: Ambulatory  Physical Examination Filed Vitals:   02/02/13 1102  BP: 147/89  Pulse: 75    LUE: Incisions are healed, skin feels , hand grip is 5/5, sensation in digits is intact, palpable thrill, bruit can  be auscultated , palpable small PSA over venous arm of AVG  Medical Decision Making  [name] is a 57 y.o. year old male who presents s/p Revision L FA AVG w/ interposition graft , small PSA over venous arm of AVG.  The patient's access is already being used, including the new segment.  At some point, the venous arm PSA might also need intervention.  At this point, there has been no bleeding complications and it does not appear at risk of rupture on exam.  Thank you for allowing [place] to participate in this patient's care.  Adele Barthel, MD [facility] [place] Office: [phone] Pager: [phone]  02/02/2013, 12:11 PM

## 2013-02-06 DIAGNOSIS — N186 End stage renal disease: Secondary | ICD-10-CM | POA: Diagnosis not present

## 2013-02-07 DIAGNOSIS — N186 End stage renal disease: Secondary | ICD-10-CM | POA: Diagnosis not present

## 2013-02-07 DIAGNOSIS — D631 Anemia in chronic kidney disease: Secondary | ICD-10-CM | POA: Diagnosis not present

## 2013-02-07 DIAGNOSIS — N2581 Secondary hyperparathyroidism of renal origin: Secondary | ICD-10-CM | POA: Diagnosis not present

## 2013-02-07 DIAGNOSIS — D509 Iron deficiency anemia, unspecified: Secondary | ICD-10-CM | POA: Diagnosis not present

## 2013-02-09 DIAGNOSIS — N2581 Secondary hyperparathyroidism of renal origin: Secondary | ICD-10-CM | POA: Diagnosis not present

## 2013-02-09 DIAGNOSIS — N186 End stage renal disease: Secondary | ICD-10-CM | POA: Diagnosis not present

## 2013-02-09 DIAGNOSIS — D509 Iron deficiency anemia, unspecified: Secondary | ICD-10-CM | POA: Diagnosis not present

## 2013-02-09 DIAGNOSIS — D631 Anemia in chronic kidney disease: Secondary | ICD-10-CM | POA: Diagnosis not present

## 2013-02-12 DIAGNOSIS — N186 End stage renal disease: Secondary | ICD-10-CM | POA: Diagnosis not present

## 2013-02-12 DIAGNOSIS — D509 Iron deficiency anemia, unspecified: Secondary | ICD-10-CM | POA: Diagnosis not present

## 2013-02-12 DIAGNOSIS — N2581 Secondary hyperparathyroidism of renal origin: Secondary | ICD-10-CM | POA: Diagnosis not present

## 2013-02-12 DIAGNOSIS — D631 Anemia in chronic kidney disease: Secondary | ICD-10-CM | POA: Diagnosis not present

## 2013-02-13 DIAGNOSIS — I2510 Atherosclerotic heart disease of native coronary artery without angina pectoris: Secondary | ICD-10-CM | POA: Diagnosis not present

## 2013-02-14 DIAGNOSIS — N186 End stage renal disease: Secondary | ICD-10-CM | POA: Diagnosis not present

## 2013-02-14 DIAGNOSIS — D631 Anemia in chronic kidney disease: Secondary | ICD-10-CM | POA: Diagnosis not present

## 2013-02-14 DIAGNOSIS — N2581 Secondary hyperparathyroidism of renal origin: Secondary | ICD-10-CM | POA: Diagnosis not present

## 2013-02-14 DIAGNOSIS — D509 Iron deficiency anemia, unspecified: Secondary | ICD-10-CM | POA: Diagnosis not present

## 2013-02-15 DIAGNOSIS — M47812 Spondylosis without myelopathy or radiculopathy, cervical region: Secondary | ICD-10-CM | POA: Diagnosis not present

## 2013-02-15 DIAGNOSIS — G5600 Carpal tunnel syndrome, unspecified upper limb: Secondary | ICD-10-CM | POA: Diagnosis not present

## 2013-02-16 DIAGNOSIS — D631 Anemia in chronic kidney disease: Secondary | ICD-10-CM | POA: Diagnosis not present

## 2013-02-16 DIAGNOSIS — N2581 Secondary hyperparathyroidism of renal origin: Secondary | ICD-10-CM | POA: Diagnosis not present

## 2013-02-16 DIAGNOSIS — N186 End stage renal disease: Secondary | ICD-10-CM | POA: Diagnosis not present

## 2013-02-16 DIAGNOSIS — D509 Iron deficiency anemia, unspecified: Secondary | ICD-10-CM | POA: Diagnosis not present

## 2013-02-19 DIAGNOSIS — D631 Anemia in chronic kidney disease: Secondary | ICD-10-CM | POA: Diagnosis not present

## 2013-02-19 DIAGNOSIS — N186 End stage renal disease: Secondary | ICD-10-CM | POA: Diagnosis not present

## 2013-02-19 DIAGNOSIS — N2581 Secondary hyperparathyroidism of renal origin: Secondary | ICD-10-CM | POA: Diagnosis not present

## 2013-02-19 DIAGNOSIS — D509 Iron deficiency anemia, unspecified: Secondary | ICD-10-CM | POA: Diagnosis not present

## 2013-02-21 DIAGNOSIS — N2581 Secondary hyperparathyroidism of renal origin: Secondary | ICD-10-CM | POA: Diagnosis not present

## 2013-02-21 DIAGNOSIS — D509 Iron deficiency anemia, unspecified: Secondary | ICD-10-CM | POA: Diagnosis not present

## 2013-02-21 DIAGNOSIS — N186 End stage renal disease: Secondary | ICD-10-CM | POA: Diagnosis not present

## 2013-02-21 DIAGNOSIS — D631 Anemia in chronic kidney disease: Secondary | ICD-10-CM | POA: Diagnosis not present

## 2013-02-22 DIAGNOSIS — M47812 Spondylosis without myelopathy or radiculopathy, cervical region: Secondary | ICD-10-CM | POA: Diagnosis not present

## 2013-02-22 DIAGNOSIS — G5600 Carpal tunnel syndrome, unspecified upper limb: Secondary | ICD-10-CM | POA: Diagnosis not present

## 2013-02-23 DIAGNOSIS — N186 End stage renal disease: Secondary | ICD-10-CM | POA: Diagnosis not present

## 2013-02-23 DIAGNOSIS — N2581 Secondary hyperparathyroidism of renal origin: Secondary | ICD-10-CM | POA: Diagnosis not present

## 2013-02-23 DIAGNOSIS — D631 Anemia in chronic kidney disease: Secondary | ICD-10-CM | POA: Diagnosis not present

## 2013-02-23 DIAGNOSIS — D509 Iron deficiency anemia, unspecified: Secondary | ICD-10-CM | POA: Diagnosis not present

## 2013-02-26 DIAGNOSIS — D509 Iron deficiency anemia, unspecified: Secondary | ICD-10-CM | POA: Diagnosis not present

## 2013-02-26 DIAGNOSIS — D631 Anemia in chronic kidney disease: Secondary | ICD-10-CM | POA: Diagnosis not present

## 2013-02-26 DIAGNOSIS — N186 End stage renal disease: Secondary | ICD-10-CM | POA: Diagnosis not present

## 2013-02-26 DIAGNOSIS — N2581 Secondary hyperparathyroidism of renal origin: Secondary | ICD-10-CM | POA: Diagnosis not present

## 2013-02-28 DIAGNOSIS — D631 Anemia in chronic kidney disease: Secondary | ICD-10-CM | POA: Diagnosis not present

## 2013-02-28 DIAGNOSIS — D509 Iron deficiency anemia, unspecified: Secondary | ICD-10-CM | POA: Diagnosis not present

## 2013-02-28 DIAGNOSIS — E1129 Type 2 diabetes mellitus with other diabetic kidney complication: Secondary | ICD-10-CM | POA: Diagnosis not present

## 2013-02-28 DIAGNOSIS — N186 End stage renal disease: Secondary | ICD-10-CM | POA: Diagnosis not present

## 2013-02-28 DIAGNOSIS — N2581 Secondary hyperparathyroidism of renal origin: Secondary | ICD-10-CM | POA: Diagnosis not present

## 2013-03-05 DIAGNOSIS — N186 End stage renal disease: Secondary | ICD-10-CM | POA: Diagnosis not present

## 2013-03-05 DIAGNOSIS — D509 Iron deficiency anemia, unspecified: Secondary | ICD-10-CM | POA: Diagnosis not present

## 2013-03-05 DIAGNOSIS — D631 Anemia in chronic kidney disease: Secondary | ICD-10-CM | POA: Diagnosis not present

## 2013-03-05 DIAGNOSIS — N2581 Secondary hyperparathyroidism of renal origin: Secondary | ICD-10-CM | POA: Diagnosis not present

## 2013-03-07 DIAGNOSIS — D631 Anemia in chronic kidney disease: Secondary | ICD-10-CM | POA: Diagnosis not present

## 2013-03-07 DIAGNOSIS — N2581 Secondary hyperparathyroidism of renal origin: Secondary | ICD-10-CM | POA: Diagnosis not present

## 2013-03-07 DIAGNOSIS — D509 Iron deficiency anemia, unspecified: Secondary | ICD-10-CM | POA: Diagnosis not present

## 2013-03-07 DIAGNOSIS — N186 End stage renal disease: Secondary | ICD-10-CM | POA: Diagnosis not present

## 2013-03-08 DIAGNOSIS — G5600 Carpal tunnel syndrome, unspecified upper limb: Secondary | ICD-10-CM | POA: Diagnosis not present

## 2013-03-09 DIAGNOSIS — D509 Iron deficiency anemia, unspecified: Secondary | ICD-10-CM | POA: Diagnosis not present

## 2013-03-09 DIAGNOSIS — D631 Anemia in chronic kidney disease: Secondary | ICD-10-CM | POA: Diagnosis not present

## 2013-03-09 DIAGNOSIS — N2581 Secondary hyperparathyroidism of renal origin: Secondary | ICD-10-CM | POA: Diagnosis not present

## 2013-03-09 DIAGNOSIS — N186 End stage renal disease: Secondary | ICD-10-CM | POA: Diagnosis not present

## 2013-03-12 ENCOUNTER — Other Ambulatory Visit: Payer: Self-pay | Admitting: Orthopedic Surgery

## 2013-03-12 DIAGNOSIS — D509 Iron deficiency anemia, unspecified: Secondary | ICD-10-CM | POA: Diagnosis not present

## 2013-03-12 DIAGNOSIS — N2581 Secondary hyperparathyroidism of renal origin: Secondary | ICD-10-CM | POA: Diagnosis not present

## 2013-03-12 DIAGNOSIS — N186 End stage renal disease: Secondary | ICD-10-CM | POA: Diagnosis not present

## 2013-03-12 DIAGNOSIS — D631 Anemia in chronic kidney disease: Secondary | ICD-10-CM | POA: Diagnosis not present

## 2013-03-14 DIAGNOSIS — N2581 Secondary hyperparathyroidism of renal origin: Secondary | ICD-10-CM | POA: Diagnosis not present

## 2013-03-14 DIAGNOSIS — N186 End stage renal disease: Secondary | ICD-10-CM | POA: Diagnosis not present

## 2013-03-14 DIAGNOSIS — D509 Iron deficiency anemia, unspecified: Secondary | ICD-10-CM | POA: Diagnosis not present

## 2013-03-14 DIAGNOSIS — D631 Anemia in chronic kidney disease: Secondary | ICD-10-CM | POA: Diagnosis not present

## 2013-03-16 DIAGNOSIS — D631 Anemia in chronic kidney disease: Secondary | ICD-10-CM | POA: Diagnosis not present

## 2013-03-16 DIAGNOSIS — D509 Iron deficiency anemia, unspecified: Secondary | ICD-10-CM | POA: Diagnosis not present

## 2013-03-16 DIAGNOSIS — N2581 Secondary hyperparathyroidism of renal origin: Secondary | ICD-10-CM | POA: Diagnosis not present

## 2013-03-16 DIAGNOSIS — N186 End stage renal disease: Secondary | ICD-10-CM | POA: Diagnosis not present

## 2013-03-19 DIAGNOSIS — N2581 Secondary hyperparathyroidism of renal origin: Secondary | ICD-10-CM | POA: Diagnosis not present

## 2013-03-19 DIAGNOSIS — D631 Anemia in chronic kidney disease: Secondary | ICD-10-CM | POA: Diagnosis not present

## 2013-03-19 DIAGNOSIS — N186 End stage renal disease: Secondary | ICD-10-CM | POA: Diagnosis not present

## 2013-03-19 DIAGNOSIS — D509 Iron deficiency anemia, unspecified: Secondary | ICD-10-CM | POA: Diagnosis not present

## 2013-03-21 DIAGNOSIS — D509 Iron deficiency anemia, unspecified: Secondary | ICD-10-CM | POA: Diagnosis not present

## 2013-03-21 DIAGNOSIS — D631 Anemia in chronic kidney disease: Secondary | ICD-10-CM | POA: Diagnosis not present

## 2013-03-21 DIAGNOSIS — N186 End stage renal disease: Secondary | ICD-10-CM | POA: Diagnosis not present

## 2013-03-21 DIAGNOSIS — N2581 Secondary hyperparathyroidism of renal origin: Secondary | ICD-10-CM | POA: Diagnosis not present

## 2013-03-22 ENCOUNTER — Encounter (HOSPITAL_BASED_OUTPATIENT_CLINIC_OR_DEPARTMENT_OTHER): Payer: Self-pay | Admitting: *Deleted

## 2013-03-22 NOTE — Progress Notes (Signed)
Pt waiting on kidney-[facility] Had av graft revision 8/14-ekg done then Will need [employer]-

## 2013-03-23 DIAGNOSIS — N2581 Secondary hyperparathyroidism of renal origin: Secondary | ICD-10-CM | POA: Diagnosis not present

## 2013-03-23 DIAGNOSIS — D509 Iron deficiency anemia, unspecified: Secondary | ICD-10-CM | POA: Diagnosis not present

## 2013-03-23 DIAGNOSIS — D631 Anemia in chronic kidney disease: Secondary | ICD-10-CM | POA: Diagnosis not present

## 2013-03-23 DIAGNOSIS — N186 End stage renal disease: Secondary | ICD-10-CM | POA: Diagnosis not present

## 2013-03-26 DIAGNOSIS — D631 Anemia in chronic kidney disease: Secondary | ICD-10-CM | POA: Diagnosis not present

## 2013-03-26 DIAGNOSIS — D509 Iron deficiency anemia, unspecified: Secondary | ICD-10-CM | POA: Diagnosis not present

## 2013-03-26 DIAGNOSIS — N186 End stage renal disease: Secondary | ICD-10-CM | POA: Diagnosis not present

## 2013-03-26 DIAGNOSIS — N2581 Secondary hyperparathyroidism of renal origin: Secondary | ICD-10-CM | POA: Diagnosis not present

## 2013-03-27 ENCOUNTER — Ambulatory Visit (HOSPITAL_BASED_OUTPATIENT_CLINIC_OR_DEPARTMENT_OTHER)
Admission: RE | Admit: 2013-03-27 | Discharge: 2013-03-27 | Disposition: A | Discharge status: Home / Self Care | Payer: Medicare Other | Source: Ambulatory Visit | Attending: Orthopedic Surgery | Admitting: Orthopedic Surgery

## 2013-03-27 ENCOUNTER — Ambulatory Visit (HOSPITAL_BASED_OUTPATIENT_CLINIC_OR_DEPARTMENT_OTHER): Payer: Medicare Other | Admitting: Anesthesiology

## 2013-03-27 ENCOUNTER — Encounter (HOSPITAL_BASED_OUTPATIENT_CLINIC_OR_DEPARTMENT_OTHER): Payer: Medicare Other | Admitting: Anesthesiology

## 2013-03-27 ENCOUNTER — Encounter (HOSPITAL_BASED_OUTPATIENT_CLINIC_OR_DEPARTMENT_OTHER): Payer: Self-pay | Admitting: Orthopedic Surgery

## 2013-03-27 ENCOUNTER — Encounter (HOSPITAL_BASED_OUTPATIENT_CLINIC_OR_DEPARTMENT_OTHER)
Admission: RE | Disposition: A | Discharge status: Home / Self Care | Payer: Self-pay | Source: Ambulatory Visit | Attending: Orthopedic Surgery

## 2013-03-27 DIAGNOSIS — I120 Hypertensive chronic kidney disease with stage 5 chronic kidney disease or end stage renal disease: Secondary | ICD-10-CM | POA: Diagnosis not present

## 2013-03-27 DIAGNOSIS — I509 Heart failure, unspecified: Secondary | ICD-10-CM | POA: Insufficient documentation

## 2013-03-27 DIAGNOSIS — N186 End stage renal disease: Secondary | ICD-10-CM | POA: Diagnosis not present

## 2013-03-27 DIAGNOSIS — G609 Hereditary and idiopathic neuropathy, unspecified: Secondary | ICD-10-CM | POA: Diagnosis not present

## 2013-03-27 DIAGNOSIS — G5600 Carpal tunnel syndrome, unspecified upper limb: Secondary | ICD-10-CM | POA: Insufficient documentation

## 2013-03-27 DIAGNOSIS — Z7982 Long term (current) use of aspirin: Secondary | ICD-10-CM | POA: Diagnosis not present

## 2013-03-27 DIAGNOSIS — Z992 Dependence on renal dialysis: Secondary | ICD-10-CM | POA: Insufficient documentation

## 2013-03-27 DIAGNOSIS — Z794 Long term (current) use of insulin: Secondary | ICD-10-CM | POA: Insufficient documentation

## 2013-03-27 DIAGNOSIS — E119 Type 2 diabetes mellitus without complications: Secondary | ICD-10-CM | POA: Diagnosis not present

## 2013-03-27 DIAGNOSIS — I10 Essential (primary) hypertension: Secondary | ICD-10-CM | POA: Diagnosis not present

## 2013-03-27 DIAGNOSIS — K219 Gastro-esophageal reflux disease without esophagitis: Secondary | ICD-10-CM | POA: Insufficient documentation

## 2013-03-27 HISTORY — PX: CARPAL TUNNEL RELEASE: SHX101

## 2013-03-27 HISTORY — DX: Presence of spectacles and contact lenses: Z97.3

## 2013-03-27 LAB — POCT I-STAT, CHEM 8
BUN: 39 mg/dL — ABNORMAL HIGH (ref 6–23)
Calcium, Ion: 1.08 mmol/L — ABNORMAL LOW (ref 1.12–1.23)
Chloride: 97 mEq/L (ref 96–112)
Creatinine, Ser: 11.8 mg/dL — ABNORMAL HIGH (ref 0.50–1.35)
Glucose, Bld: 148 mg/dL — ABNORMAL HIGH (ref 70–99)
HCT: 33 % — ABNORMAL LOW (ref 39.0–52.0)
Hemoglobin: 11.2 g/dL — ABNORMAL LOW (ref 13.0–17.0)
Potassium: 4.7 mEq/L (ref 3.5–5.1)
Sodium: 136 mEq/L (ref 135–145)
TCO2: 28 mmol/L (ref 0–100)

## 2013-03-27 LAB — GLUCOSE, CAPILLARY: Glucose-Capillary: 118 mg/dL — ABNORMAL HIGH (ref 70–99)

## 2013-03-27 SURGERY — CARPAL TUNNEL RELEASE
Anesthesia: General | Site: Hand | Laterality: Right | Wound class: Clean

## 2013-03-27 MED ORDER — MIDAZOLAM HCL 2 MG/2ML IJ SOLN
INTRAMUSCULAR | Status: AC
  Filled 2013-03-27: qty 2

## 2013-03-27 MED ORDER — FENTANYL CITRATE 0.05 MG/ML IJ SOLN
INTRAMUSCULAR | Status: DC | PRN
  Administered 2013-03-27: 100 ug via INTRAVENOUS

## 2013-03-27 MED ORDER — BUPIVACAINE HCL (PF) 0.25 % IJ SOLN
INTRAMUSCULAR | Status: AC
  Filled 2013-03-27: qty 30

## 2013-03-27 MED ORDER — 0.9 % SODIUM CHLORIDE (POUR BTL) OPTIME
TOPICAL | Status: DC | PRN
  Administered 2013-03-27: 200 mL

## 2013-03-27 MED ORDER — OXYCODONE HCL 5 MG PO TABS
ORAL_TABLET | ORAL | Status: AC
  Filled 2013-03-27: qty 1

## 2013-03-27 MED ORDER — HYDROMORPHONE HCL PF 1 MG/ML IJ SOLN
0.2500 mg | INTRAMUSCULAR | Status: DC | PRN
  Administered 2013-03-27 (×2): 0.5 mg via INTRAVENOUS

## 2013-03-27 MED ORDER — ONDANSETRON HCL 4 MG/2ML IJ SOLN
INTRAMUSCULAR | Status: DC | PRN
  Administered 2013-03-27: 4 mg via INTRAVENOUS

## 2013-03-27 MED ORDER — LACTATED RINGERS IV SOLN: INTRAVENOUS | Status: DC

## 2013-03-27 MED ORDER — CHLORHEXIDINE GLUCONATE 4 % EX LIQD: 60.0000 mL | Freq: Once | CUTANEOUS | Status: DC

## 2013-03-27 MED ORDER — OXYCODONE HCL 5 MG PO TABS
5.0000 mg | ORAL_TABLET | Freq: Once | ORAL | Status: AC | PRN
  Administered 2013-03-27: 5 mg via ORAL

## 2013-03-27 MED ORDER — OXYCODONE HCL 5 MG/5ML PO SOLN: 5.0000 mg | Freq: Once | ORAL | Status: AC | PRN

## 2013-03-27 MED ORDER — BUPIVACAINE HCL (PF) 0.25 % IJ SOLN
INTRAMUSCULAR | Status: DC | PRN
  Administered 2013-03-27: 8 mL

## 2013-03-27 MED ORDER — PROPOFOL 10 MG/ML IV BOLUS
INTRAVENOUS | Status: DC | PRN
  Administered 2013-03-27: 120 mg via INTRAVENOUS

## 2013-03-27 MED ORDER — FENTANYL CITRATE 0.05 MG/ML IJ SOLN
INTRAMUSCULAR | Status: AC
  Filled 2013-03-27: qty 2

## 2013-03-27 MED ORDER — HYDROMORPHONE HCL PF 1 MG/ML IJ SOLN
INTRAMUSCULAR | Status: AC
  Filled 2013-03-27: qty 1

## 2013-03-27 MED ORDER — HYDROCODONE-ACETAMINOPHEN 5-325 MG PO TABS: 1.0000 | ORAL_TABLET | Freq: Four times a day (QID) | ORAL | Status: DC | PRN

## 2013-03-27 MED ORDER — SODIUM CHLORIDE 0.9 % IV SOLN
INTRAVENOUS | Status: DC | PRN
  Administered 2013-03-27: 10:00:00 via INTRAVENOUS

## 2013-03-27 MED ORDER — CEFAZOLIN SODIUM-DEXTROSE 2-3 GM-% IV SOLR
2.0000 g | INTRAVENOUS | Status: AC
  Administered 2013-03-27: 2 g via INTRAVENOUS

## 2013-03-27 MED ORDER — DEXAMETHASONE SODIUM PHOSPHATE 10 MG/ML IJ SOLN
INTRAMUSCULAR | Status: DC | PRN
  Administered 2013-03-27: 4 mg via INTRAVENOUS

## 2013-03-27 SURGICAL SUPPLY — 43 items
BANDAGE GAUZE ELAST BULKY 4 IN (GAUZE/BANDAGES/DRESSINGS) ×2 IMPLANT
BLADE SURG 15 STRL LF DISP TIS (BLADE) ×1 IMPLANT
BLADE SURG 15 STRL SS (BLADE) ×2
BNDG CMPR 9X4 STRL LF SNTH (GAUZE/BANDAGES/DRESSINGS) ×1
BNDG COHESIVE 3X5 TAN STRL LF (GAUZE/BANDAGES/DRESSINGS) ×2 IMPLANT
BNDG ESMARK 4X9 LF (GAUZE/BANDAGES/DRESSINGS) ×1 IMPLANT
CHLORAPREP W/TINT 26ML (MISCELLANEOUS) ×2 IMPLANT
CORDS BIPOLAR (ELECTRODE) ×2 IMPLANT
COVER MAYO STAND STRL (DRAPES) ×2 IMPLANT
COVER TABLE BACK 60X90 (DRAPES) ×2 IMPLANT
CUFF TOURNIQUET SINGLE 18IN (TOURNIQUET CUFF) ×2 IMPLANT
DRAPE EXTREMITY T 121X128X90 (DRAPE) ×2 IMPLANT
DRAPE SURG 17X23 STRL (DRAPES) ×2 IMPLANT
DRSG KUZMA FLUFF (GAUZE/BANDAGES/DRESSINGS) ×2 IMPLANT
GAUZE XEROFORM 1X8 LF (GAUZE/BANDAGES/DRESSINGS) ×2 IMPLANT
GLOVE BIO SURGEON STRL SZ 6.5 (GLOVE) ×1 IMPLANT
GLOVE BIO SURGEON STRL SZ7.5 (GLOVE) ×1 IMPLANT
GLOVE BIOGEL PI IND STRL 7.0 (GLOVE) IMPLANT
GLOVE BIOGEL PI IND STRL 8 (GLOVE) IMPLANT
GLOVE BIOGEL PI IND STRL 8.5 (GLOVE) ×1 IMPLANT
GLOVE BIOGEL PI INDICATOR 7.0 (GLOVE) ×1
GLOVE BIOGEL PI INDICATOR 8 (GLOVE) ×1
GLOVE BIOGEL PI INDICATOR 8.5 (GLOVE) ×1
GLOVE ECLIPSE 6.5 STRL STRAW (GLOVE) ×1 IMPLANT
GLOVE EXAM NITRILE MD LF STRL (GLOVE) ×1 IMPLANT
GLOVE SURG ORTHO 8.0 STRL STRW (GLOVE) ×2 IMPLANT
GOWN BRE IMP PREV XXLGXLNG (GOWN DISPOSABLE) ×3 IMPLANT
GOWN PREVENTION PLUS XLARGE (GOWN DISPOSABLE) ×2 IMPLANT
NEEDLE 27GAX1X1/2 (NEEDLE) ×1 IMPLANT
NS IRRIG 1000ML POUR BTL (IV SOLUTION) ×2 IMPLANT
PACK BASIN DAY SURGERY FS (CUSTOM PROCEDURE TRAY) ×2 IMPLANT
PAD CAST 3X4 CTTN HI CHSV (CAST SUPPLIES) ×1 IMPLANT
PADDING CAST ABS 4INX4YD NS (CAST SUPPLIES) ×1
PADDING CAST ABS COTTON 4X4 ST (CAST SUPPLIES) ×1 IMPLANT
PADDING CAST COTTON 3X4 STRL (CAST SUPPLIES) ×2
SPONGE GAUZE 4X4 12PLY (GAUZE/BANDAGES/DRESSINGS) ×2 IMPLANT
STOCKINETTE 4X48 STRL (DRAPES) ×2 IMPLANT
SUT VICRYL 4-0 PS2 18IN ABS (SUTURE) IMPLANT
SUT VICRYL RAPIDE 4/0 PS 2 (SUTURE) ×2 IMPLANT
SYR BULB 3OZ (MISCELLANEOUS) ×2 IMPLANT
SYR CONTROL 10ML LL (SYRINGE) ×1 IMPLANT
TOWEL OR 17X24 6PK STRL BLUE (TOWEL DISPOSABLE) ×2 IMPLANT
UNDERPAD 30X30 INCONTINENT (UNDERPADS AND DIAPERS) ×2 IMPLANT

## 2013-03-27 NOTE — Brief Op Note (Signed)
03/27/2013  10:11 AM  PATIENT:  [name]  56 y.o. male  PRE-OPERATIVE DIAGNOSIS:  RIGHT CARPAL TUNNEL SYNDROME  POST-OPERATIVE DIAGNOSIS:  RIGHT CARPAL TUNNEL SYNDROME  PROCEDURE:  Procedure(s): RIGHT CARPAL TUNNEL RELEASE (Right)  SURGEON:  Surgeon(s) and Role:    * Wynonia Sours, MD - Primary    * Tennis Must, MD - Assisting  PHYSICIAN ASSISTANT:   ASSISTANTS: Richardo Priest, MD   ANESTHESIA:   local and general  EBL:  Total I/O In: 100 [I.V.:100] Out: -   BLOOD ADMINISTERED:none  DRAINS: none   LOCAL MEDICATIONS USED:  BUPIVICAINE   SPECIMEN:  No Specimen  DISPOSITION OF SPECIMEN:  N/A  COUNTS:  YES  TOURNIQUET:   Total Tourniquet Time Documented: Upper Arm (Right) - 11 minutes Total: Upper Arm (Right) - 11 minutes   DICTATION: .Other Dictation: Dictation Number [id number]  PLAN OF CARE: Discharge to home after PACU  PATIENT DISPOSITION:  PACU - hemodynamically stable.

## 2013-03-27 NOTE — Anesthesia Preprocedure Evaluation (Addendum)
Anesthesia Evaluation  Patient identified by MRN, date of birth, ID band Patient awake    Reviewed: Allergy & Precautions, H&P , NPO status , Patient's Chart, lab work & pertinent test results, reviewed documented beta blocker date and time   Airway Mallampati: II TM Distance: >3 FB Neck ROM: Full    Dental no notable dental hx. (+) Teeth Intact and Dental Advisory Given   Pulmonary neg pulmonary ROS,  breath sounds clear to auscultation  Pulmonary exam normal       Cardiovascular hypertension, On Medications and Pt. on home beta blockers +CHF negative cardio ROS  Rhythm:Regular Rate:Normal     Neuro/Psych negative neurological ROS  negative psych ROS   GI/Hepatic Neg liver ROS, GERD-  Medicated and Controlled,  Endo/Other  diabetes, Type 1, Insulin Dependent  Renal/GU ESRF and DialysisRenal disease  negative genitourinary   Musculoskeletal   Abdominal   Peds  Hematology negative hematology ROS (+)   Anesthesia Other Findings   Reproductive/Obstetrics negative OB ROS                         Anesthesia Physical Anesthesia Plan  ASA: III  Anesthesia Plan: General   Post-op Pain Management:    Induction: Intravenous  Airway Management Planned: LMA  Additional Equipment:   Intra-op Plan:   Post-operative Plan: Extubation in OR  Informed Consent: I have reviewed the patients History and Physical, chart, labs and discussed the procedure including the risks, benefits and alternatives for the proposed anesthesia with the patient or authorized representative who has indicated his/her understanding and acceptance.   Dental advisory given  Plan Discussed with: CRNA and Surgeon  Anesthesia Plan Comments:        Anesthesia Quick Evaluation

## 2013-03-27 NOTE — Transfer of Care (Signed)
Immediate Anesthesia Transfer of Care Note  Patient: [name]  Procedure(s) Performed: Procedure(s): RIGHT CARPAL TUNNEL RELEASE (Right)  Patient Location: PACU  Anesthesia Type:General  Level of Consciousness: sedated  Airway & Oxygen Therapy: Patient Spontanous Breathing and Patient connected to face mask oxygen  Post-op Assessment: Report given to PACU RN and Post -op Vital signs reviewed and stable  Post vital signs: Reviewed and stable  Complications: No apparent anesthesia complications
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
## 2013-03-27 NOTE — Op Note (Signed)
Dictation Number [id number]

## 2013-03-27 NOTE — Anesthesia Procedure Notes (Signed)
Procedure Name: LMA Insertion Date/Time: 03/27/2013 9:50 AM Performed by: Lieutenant Diego Pre-anesthesia Checklist: Patient identified, Emergency Drugs available, Suction available and Patient being monitored Patient Re-evaluated:Patient Re-evaluated prior to inductionOxygen Delivery Method: Circle System Utilized Preoxygenation: Pre-oxygenation with 100% oxygen Intubation Type: IV induction Ventilation: Mask ventilation without difficulty LMA: LMA inserted LMA Size: 5.0 Number of attempts: 1 Airway Equipment and Method: bite block Placement Confirmation: positive ETCO2 and breath sounds checked- equal and bilateral Tube secured with: Tape Dental Injury: Teeth and Oropharynx as per pre-operative assessment

## 2013-03-27 NOTE — Anesthesia Postprocedure Evaluation (Signed)
  Anesthesia Post-op Note  Patient: [name]  Procedure(s) Performed: Procedure(s): RIGHT CARPAL TUNNEL RELEASE (Right)  Patient Location: PACU  Anesthesia Type:General  Level of Consciousness: awake and alert   Airway and Oxygen Therapy: Patient Spontanous Breathing  Post-op Pain: mild  Post-op Assessment: Post-op Vital signs reviewed, Patient's Cardiovascular Status Stable and Respiratory Function Stable  Post-op Vital Signs: Reviewed  Filed Vitals:   03/27/13 1145  BP: 148/55  Pulse: 66  Temp: 36.4 C  Resp: 20    Complications: No apparent anesthesia complications

## 2013-03-28 ENCOUNTER — Encounter (HOSPITAL_BASED_OUTPATIENT_CLINIC_OR_DEPARTMENT_OTHER): Payer: Self-pay | Admitting: Orthopedic Surgery

## 2013-03-28 DIAGNOSIS — D631 Anemia in chronic kidney disease: Secondary | ICD-10-CM | POA: Diagnosis not present

## 2013-03-28 DIAGNOSIS — N186 End stage renal disease: Secondary | ICD-10-CM | POA: Diagnosis not present

## 2013-03-28 DIAGNOSIS — N2581 Secondary hyperparathyroidism of renal origin: Secondary | ICD-10-CM | POA: Diagnosis not present

## 2013-03-28 DIAGNOSIS — D509 Iron deficiency anemia, unspecified: Secondary | ICD-10-CM | POA: Diagnosis not present

## 2013-03-28 NOTE — Op Note (Signed)
NAMEKEVIN, WINQUIST                 ACCOUNT NO.:  [id number]  MEDICAL RECORD NO.:  [id number]  LOCATION:                                 FACILITY:  PHYSICIAN:  Daryll Brod, M.D.            DATE OF BIRTH:  DATE OF PROCEDURE:  03/27/2013 DATE OF DISCHARGE:                              OPERATIVE REPORT   PREOPERATIVE DIAGNOSIS:  Carpal tunnel syndrome, right hand.  POSTOPERATIVE DIAGNOSIS:  Carpal tunnel syndrome, right hand.  OPERATION:  Decompression, right median nerve.  SURGEON:  Daryll Brod, M.D.  ANESTHESIA:  General with local infiltration.  ANESTHESIOLOGIST:  Soledad Gerlach, MD  HISTORY:  The patient is a 57 year old male with renal problems.  He has symptoms of carpal tunnel syndrome.  Nerve conductions are positive.  He has not responded to conservative treatment and he has elected to undergo surgical release of the median nerve at the wrist.  Pre, peri, postoperative course have been discussed along with risks and complications.  She is aware that there is no guarantee with a surgery; possibility of infection; recurrence of injury to arteries, nerves, tendons; incomplete relief of symptoms; dystrophy.  In the preoperative area, the patient was seen.  The extremity was marked by both the patient and surgeon.  Antibiotic given.  PROCEDURE IN DETAIL:  The patient was brought to the operating room where a general anesthetic was carried out without difficulty.  He was prepped using ChloraPrep in supine position with the right arm free.  A 3-minute dry time was allowed.  Time-out taken, confirming patient procedure.  A longitudinal incision was made in the right palm, carried down through subcutaneous tissue.  Bleeders were electrocauterized. Palmar fascia was split.  Superficial palmar arch identified.  Flexor tendon near the ring, little finger identified to the ulnar side of median nerve.  The carpal retinaculum was incised with sharp dissection. Right angle and  Sewell retractors were placed between skin and forearm fascia.  The fascia was released for approximately a centimeter and half proximal to the wrist crease under direct vision.  Canal was explored. Compression to the nerves were apparent with  moderate fibrosis.  Motor branch entered into the muscle.  No further lesions were identified. The wound was irrigated and the skin was closed with interrupted 4-0 Vicryl Rapide sutures.  Local infiltration with 0.25% Marcaine without epinephrine was given, approximately 6 mL was used.  Sterile compressive dressing with fingers free was applied.  On deflation of the tourniquet, all fingers immediately pinked.  He was taken to the recovery room for observation in satisfactory condition.  He will be discharged home to return [place] in 1 week on Vicodin.          ______________________________ Daryll Brod, M.D.     GK/MEDQ  D:  03/27/2013  T:  03/28/2013  Job:  [id number]

## 2013-03-30 DIAGNOSIS — D631 Anemia in chronic kidney disease: Secondary | ICD-10-CM | POA: Diagnosis not present

## 2013-03-30 DIAGNOSIS — N2581 Secondary hyperparathyroidism of renal origin: Secondary | ICD-10-CM | POA: Diagnosis not present

## 2013-03-30 DIAGNOSIS — D509 Iron deficiency anemia, unspecified: Secondary | ICD-10-CM | POA: Diagnosis not present

## 2013-03-30 DIAGNOSIS — N186 End stage renal disease: Secondary | ICD-10-CM | POA: Diagnosis not present

## 2013-04-02 DIAGNOSIS — N2581 Secondary hyperparathyroidism of renal origin: Secondary | ICD-10-CM | POA: Diagnosis not present

## 2013-04-02 DIAGNOSIS — N186 End stage renal disease: Secondary | ICD-10-CM | POA: Diagnosis not present

## 2013-04-02 DIAGNOSIS — D509 Iron deficiency anemia, unspecified: Secondary | ICD-10-CM | POA: Diagnosis not present

## 2013-04-02 DIAGNOSIS — D631 Anemia in chronic kidney disease: Secondary | ICD-10-CM | POA: Diagnosis not present

## 2013-04-04 DIAGNOSIS — D509 Iron deficiency anemia, unspecified: Secondary | ICD-10-CM | POA: Diagnosis not present

## 2013-04-04 DIAGNOSIS — N186 End stage renal disease: Secondary | ICD-10-CM | POA: Diagnosis not present

## 2013-04-04 DIAGNOSIS — D631 Anemia in chronic kidney disease: Secondary | ICD-10-CM | POA: Diagnosis not present

## 2013-04-04 DIAGNOSIS — N2581 Secondary hyperparathyroidism of renal origin: Secondary | ICD-10-CM | POA: Diagnosis not present

## 2013-04-06 DIAGNOSIS — D631 Anemia in chronic kidney disease: Secondary | ICD-10-CM | POA: Diagnosis not present

## 2013-04-06 DIAGNOSIS — N186 End stage renal disease: Secondary | ICD-10-CM | POA: Diagnosis not present

## 2013-04-06 DIAGNOSIS — D509 Iron deficiency anemia, unspecified: Secondary | ICD-10-CM | POA: Diagnosis not present

## 2013-04-06 DIAGNOSIS — N2581 Secondary hyperparathyroidism of renal origin: Secondary | ICD-10-CM | POA: Diagnosis not present

## 2013-04-08 DIAGNOSIS — N186 End stage renal disease: Secondary | ICD-10-CM | POA: Diagnosis not present

## 2013-04-09 DIAGNOSIS — E1129 Type 2 diabetes mellitus with other diabetic kidney complication: Secondary | ICD-10-CM | POA: Diagnosis not present

## 2013-04-09 DIAGNOSIS — N2581 Secondary hyperparathyroidism of renal origin: Secondary | ICD-10-CM | POA: Diagnosis not present

## 2013-04-09 DIAGNOSIS — D631 Anemia in chronic kidney disease: Secondary | ICD-10-CM | POA: Diagnosis not present

## 2013-04-09 DIAGNOSIS — D509 Iron deficiency anemia, unspecified: Secondary | ICD-10-CM | POA: Diagnosis not present

## 2013-04-09 DIAGNOSIS — N186 End stage renal disease: Secondary | ICD-10-CM | POA: Diagnosis not present

## 2013-05-09 DIAGNOSIS — N186 End stage renal disease: Secondary | ICD-10-CM | POA: Diagnosis not present

## 2013-05-10 HISTORY — PX: NEPHRECTOMY TRANSPLANTED ORGAN: SUR880

## 2013-05-11 DIAGNOSIS — N186 End stage renal disease: Secondary | ICD-10-CM | POA: Diagnosis not present

## 2013-05-13 DIAGNOSIS — J810 Acute pulmonary edema: Secondary | ICD-10-CM | POA: Diagnosis not present

## 2013-05-13 DIAGNOSIS — Z5181 Encounter for therapeutic drug level monitoring: Secondary | ICD-10-CM | POA: Diagnosis not present

## 2013-05-13 DIAGNOSIS — N170 Acute kidney failure with tubular necrosis: Secondary | ICD-10-CM | POA: Diagnosis not present

## 2013-05-13 DIAGNOSIS — Z79899 Other long term (current) drug therapy: Secondary | ICD-10-CM | POA: Diagnosis not present

## 2013-05-13 DIAGNOSIS — E1149 Type 2 diabetes mellitus with other diabetic neurological complication: Secondary | ICD-10-CM | POA: Diagnosis not present

## 2013-05-13 DIAGNOSIS — Z48298 Encounter for aftercare following other organ transplant: Secondary | ICD-10-CM | POA: Diagnosis not present

## 2013-05-13 DIAGNOSIS — N186 End stage renal disease: Secondary | ICD-10-CM | POA: Diagnosis not present

## 2013-05-13 DIAGNOSIS — Z940 Kidney transplant status: Secondary | ICD-10-CM | POA: Diagnosis not present

## 2013-05-13 DIAGNOSIS — I120 Hypertensive chronic kidney disease with stage 5 chronic kidney disease or end stage renal disease: Secondary | ICD-10-CM | POA: Diagnosis not present

## 2013-05-13 DIAGNOSIS — I498 Other specified cardiac arrhythmias: Secondary | ICD-10-CM | POA: Diagnosis not present

## 2013-05-14 DIAGNOSIS — E1129 Type 2 diabetes mellitus with other diabetic kidney complication: Secondary | ICD-10-CM | POA: Diagnosis present

## 2013-05-14 DIAGNOSIS — Z794 Long term (current) use of insulin: Secondary | ICD-10-CM | POA: Diagnosis not present

## 2013-05-14 DIAGNOSIS — N186 End stage renal disease: Secondary | ICD-10-CM | POA: Diagnosis not present

## 2013-05-14 DIAGNOSIS — E1142 Type 2 diabetes mellitus with diabetic polyneuropathy: Secondary | ICD-10-CM | POA: Diagnosis present

## 2013-05-14 DIAGNOSIS — I120 Hypertensive chronic kidney disease with stage 5 chronic kidney disease or end stage renal disease: Secondary | ICD-10-CM | POA: Diagnosis present

## 2013-05-14 DIAGNOSIS — N2581 Secondary hyperparathyroidism of renal origin: Secondary | ICD-10-CM | POA: Diagnosis present

## 2013-05-14 DIAGNOSIS — D631 Anemia in chronic kidney disease: Secondary | ICD-10-CM | POA: Diagnosis present

## 2013-05-14 DIAGNOSIS — K929 Disease of digestive system, unspecified: Secondary | ICD-10-CM | POA: Diagnosis not present

## 2013-05-14 DIAGNOSIS — Z96659 Presence of unspecified artificial knee joint: Secondary | ICD-10-CM | POA: Diagnosis not present

## 2013-05-14 DIAGNOSIS — Z79899 Other long term (current) drug therapy: Secondary | ICD-10-CM | POA: Diagnosis not present

## 2013-05-14 DIAGNOSIS — R141 Gas pain: Secondary | ICD-10-CM | POA: Diagnosis not present

## 2013-05-14 DIAGNOSIS — N058 Unspecified nephritic syndrome with other morphologic changes: Secondary | ICD-10-CM | POA: Diagnosis present

## 2013-05-14 DIAGNOSIS — J811 Chronic pulmonary edema: Secondary | ICD-10-CM | POA: Diagnosis not present

## 2013-05-14 DIAGNOSIS — Y9210 Unspecified residential institution as the place of occurrence of the external cause: Secondary | ICD-10-CM | POA: Diagnosis not present

## 2013-05-14 DIAGNOSIS — E875 Hyperkalemia: Secondary | ICD-10-CM | POA: Diagnosis not present

## 2013-05-14 DIAGNOSIS — I959 Hypotension, unspecified: Secondary | ICD-10-CM | POA: Diagnosis not present

## 2013-05-14 DIAGNOSIS — E11319 Type 2 diabetes mellitus with unspecified diabetic retinopathy without macular edema: Secondary | ICD-10-CM | POA: Diagnosis present

## 2013-05-14 DIAGNOSIS — Z48298 Encounter for aftercare following other organ transplant: Secondary | ICD-10-CM | POA: Diagnosis not present

## 2013-05-14 DIAGNOSIS — E1139 Type 2 diabetes mellitus with other diabetic ophthalmic complication: Secondary | ICD-10-CM | POA: Diagnosis present

## 2013-05-14 DIAGNOSIS — Z940 Kidney transplant status: Secondary | ICD-10-CM | POA: Diagnosis not present

## 2013-05-14 DIAGNOSIS — Z992 Dependence on renal dialysis: Secondary | ICD-10-CM | POA: Diagnosis not present

## 2013-05-14 DIAGNOSIS — E1149 Type 2 diabetes mellitus with other diabetic neurological complication: Secondary | ICD-10-CM | POA: Diagnosis present

## 2013-05-14 DIAGNOSIS — I2510 Atherosclerotic heart disease of native coronary artery without angina pectoris: Secondary | ICD-10-CM | POA: Diagnosis present

## 2013-05-14 DIAGNOSIS — E119 Type 2 diabetes mellitus without complications: Secondary | ICD-10-CM | POA: Diagnosis not present

## 2013-05-14 DIAGNOSIS — Z5181 Encounter for therapeutic drug level monitoring: Secondary | ICD-10-CM | POA: Diagnosis not present

## 2013-05-14 DIAGNOSIS — R918 Other nonspecific abnormal finding of lung field: Secondary | ICD-10-CM | POA: Diagnosis not present

## 2013-05-14 DIAGNOSIS — K560 Paralytic ileus: Secondary | ICD-10-CM | POA: Diagnosis not present

## 2013-05-21 DIAGNOSIS — E119 Type 2 diabetes mellitus without complications: Secondary | ICD-10-CM | POA: Diagnosis not present

## 2013-05-21 DIAGNOSIS — E1129 Type 2 diabetes mellitus with other diabetic kidney complication: Secondary | ICD-10-CM | POA: Diagnosis not present

## 2013-05-21 DIAGNOSIS — D849 Immunodeficiency, unspecified: Secondary | ICD-10-CM | POA: Insufficient documentation

## 2013-05-21 DIAGNOSIS — Z79899 Other long term (current) drug therapy: Secondary | ICD-10-CM | POA: Diagnosis not present

## 2013-05-21 DIAGNOSIS — Z96652 Presence of left artificial knee joint: Secondary | ICD-10-CM

## 2013-05-21 DIAGNOSIS — I10 Essential (primary) hypertension: Secondary | ICD-10-CM | POA: Diagnosis not present

## 2013-05-21 DIAGNOSIS — Z940 Kidney transplant status: Secondary | ICD-10-CM | POA: Diagnosis not present

## 2013-05-22 DIAGNOSIS — E1129 Type 2 diabetes mellitus with other diabetic kidney complication: Secondary | ICD-10-CM | POA: Diagnosis not present

## 2013-05-22 DIAGNOSIS — Z48298 Encounter for aftercare following other organ transplant: Secondary | ICD-10-CM | POA: Diagnosis not present

## 2013-05-22 DIAGNOSIS — N186 End stage renal disease: Secondary | ICD-10-CM | POA: Diagnosis not present

## 2013-05-22 DIAGNOSIS — I120 Hypertensive chronic kidney disease with stage 5 chronic kidney disease or end stage renal disease: Secondary | ICD-10-CM | POA: Diagnosis not present

## 2013-05-22 DIAGNOSIS — R269 Unspecified abnormalities of gait and mobility: Secondary | ICD-10-CM | POA: Diagnosis not present

## 2013-05-22 DIAGNOSIS — Z940 Kidney transplant status: Secondary | ICD-10-CM | POA: Diagnosis not present

## 2013-05-24 DIAGNOSIS — E1129 Type 2 diabetes mellitus with other diabetic kidney complication: Secondary | ICD-10-CM | POA: Diagnosis not present

## 2013-05-24 DIAGNOSIS — E119 Type 2 diabetes mellitus without complications: Secondary | ICD-10-CM | POA: Diagnosis not present

## 2013-05-24 DIAGNOSIS — I10 Essential (primary) hypertension: Secondary | ICD-10-CM | POA: Diagnosis not present

## 2013-05-24 DIAGNOSIS — D649 Anemia, unspecified: Secondary | ICD-10-CM | POA: Diagnosis not present

## 2013-05-24 DIAGNOSIS — D899 Disorder involving the immune mechanism, unspecified: Secondary | ICD-10-CM | POA: Diagnosis not present

## 2013-05-24 DIAGNOSIS — M25569 Pain in unspecified knee: Secondary | ICD-10-CM | POA: Diagnosis not present

## 2013-05-24 DIAGNOSIS — Z940 Kidney transplant status: Secondary | ICD-10-CM | POA: Diagnosis not present

## 2013-05-24 DIAGNOSIS — N186 End stage renal disease: Secondary | ICD-10-CM | POA: Diagnosis not present

## 2013-05-24 DIAGNOSIS — I120 Hypertensive chronic kidney disease with stage 5 chronic kidney disease or end stage renal disease: Secondary | ICD-10-CM | POA: Diagnosis not present

## 2013-05-24 DIAGNOSIS — Z48298 Encounter for aftercare following other organ transplant: Secondary | ICD-10-CM | POA: Diagnosis not present

## 2013-05-24 DIAGNOSIS — Z79899 Other long term (current) drug therapy: Secondary | ICD-10-CM | POA: Diagnosis not present

## 2013-05-25 DIAGNOSIS — N186 End stage renal disease: Secondary | ICD-10-CM | POA: Diagnosis not present

## 2013-05-25 DIAGNOSIS — I120 Hypertensive chronic kidney disease with stage 5 chronic kidney disease or end stage renal disease: Secondary | ICD-10-CM | POA: Diagnosis not present

## 2013-05-25 DIAGNOSIS — Z48298 Encounter for aftercare following other organ transplant: Secondary | ICD-10-CM | POA: Diagnosis not present

## 2013-05-25 DIAGNOSIS — Z940 Kidney transplant status: Secondary | ICD-10-CM | POA: Diagnosis not present

## 2013-05-25 DIAGNOSIS — E1129 Type 2 diabetes mellitus with other diabetic kidney complication: Secondary | ICD-10-CM | POA: Diagnosis not present

## 2013-05-25 DIAGNOSIS — R269 Unspecified abnormalities of gait and mobility: Secondary | ICD-10-CM | POA: Diagnosis not present

## 2013-05-29 DIAGNOSIS — I120 Hypertensive chronic kidney disease with stage 5 chronic kidney disease or end stage renal disease: Secondary | ICD-10-CM | POA: Diagnosis present

## 2013-05-29 DIAGNOSIS — Z940 Kidney transplant status: Secondary | ICD-10-CM | POA: Diagnosis not present

## 2013-05-29 DIAGNOSIS — E878 Other disorders of electrolyte and fluid balance, not elsewhere classified: Secondary | ICD-10-CM | POA: Diagnosis not present

## 2013-05-29 DIAGNOSIS — N186 End stage renal disease: Secondary | ICD-10-CM | POA: Diagnosis not present

## 2013-05-29 DIAGNOSIS — Z48298 Encounter for aftercare following other organ transplant: Secondary | ICD-10-CM | POA: Diagnosis not present

## 2013-05-29 DIAGNOSIS — E1139 Type 2 diabetes mellitus with other diabetic ophthalmic complication: Secondary | ICD-10-CM | POA: Diagnosis present

## 2013-05-29 DIAGNOSIS — Z79899 Other long term (current) drug therapy: Secondary | ICD-10-CM | POA: Diagnosis not present

## 2013-05-29 DIAGNOSIS — E11319 Type 2 diabetes mellitus with unspecified diabetic retinopathy without macular edema: Secondary | ICD-10-CM | POA: Diagnosis present

## 2013-05-29 DIAGNOSIS — Z794 Long term (current) use of insulin: Secondary | ICD-10-CM | POA: Diagnosis not present

## 2013-05-29 DIAGNOSIS — I10 Essential (primary) hypertension: Secondary | ICD-10-CM | POA: Diagnosis not present

## 2013-05-29 DIAGNOSIS — E1129 Type 2 diabetes mellitus with other diabetic kidney complication: Secondary | ICD-10-CM | POA: Diagnosis present

## 2013-05-29 DIAGNOSIS — I4892 Unspecified atrial flutter: Secondary | ICD-10-CM | POA: Diagnosis present

## 2013-05-29 DIAGNOSIS — E1142 Type 2 diabetes mellitus with diabetic polyneuropathy: Secondary | ICD-10-CM | POA: Diagnosis present

## 2013-05-29 DIAGNOSIS — I499 Cardiac arrhythmia, unspecified: Secondary | ICD-10-CM | POA: Diagnosis not present

## 2013-05-29 DIAGNOSIS — I739 Peripheral vascular disease, unspecified: Secondary | ICD-10-CM | POA: Diagnosis present

## 2013-05-29 DIAGNOSIS — I359 Nonrheumatic aortic valve disorder, unspecified: Secondary | ICD-10-CM | POA: Diagnosis not present

## 2013-05-29 DIAGNOSIS — Z006 Encounter for examination for normal comparison and control in clinical research program: Secondary | ICD-10-CM | POA: Diagnosis not present

## 2013-05-29 DIAGNOSIS — E119 Type 2 diabetes mellitus without complications: Secondary | ICD-10-CM | POA: Diagnosis not present

## 2013-05-29 DIAGNOSIS — E1149 Type 2 diabetes mellitus with other diabetic neurological complication: Secondary | ICD-10-CM | POA: Diagnosis present

## 2013-05-29 DIAGNOSIS — N039 Chronic nephritic syndrome with unspecified morphologic changes: Secondary | ICD-10-CM | POA: Diagnosis present

## 2013-05-29 DIAGNOSIS — D631 Anemia in chronic kidney disease: Secondary | ICD-10-CM | POA: Diagnosis present

## 2013-05-29 DIAGNOSIS — R9431 Abnormal electrocardiogram [ECG] [EKG]: Secondary | ICD-10-CM | POA: Diagnosis not present

## 2013-05-29 DIAGNOSIS — I2510 Atherosclerotic heart disease of native coronary artery without angina pectoris: Secondary | ICD-10-CM | POA: Diagnosis present

## 2013-05-29 DIAGNOSIS — IMO0002 Reserved for concepts with insufficient information to code with codable children: Secondary | ICD-10-CM | POA: Diagnosis not present

## 2013-05-29 DIAGNOSIS — K219 Gastro-esophageal reflux disease without esophagitis: Secondary | ICD-10-CM | POA: Diagnosis present

## 2013-05-29 DIAGNOSIS — E872 Acidosis, unspecified: Secondary | ICD-10-CM | POA: Diagnosis present

## 2013-05-29 DIAGNOSIS — N058 Unspecified nephritic syndrome with other morphologic changes: Secondary | ICD-10-CM | POA: Diagnosis present

## 2013-05-29 DIAGNOSIS — E875 Hyperkalemia: Secondary | ICD-10-CM | POA: Diagnosis not present

## 2013-05-29 DIAGNOSIS — Z5181 Encounter for therapeutic drug level monitoring: Secondary | ICD-10-CM | POA: Diagnosis not present

## 2013-05-29 DIAGNOSIS — I517 Cardiomegaly: Secondary | ICD-10-CM | POA: Diagnosis not present

## 2013-05-31 DIAGNOSIS — Z940 Kidney transplant status: Secondary | ICD-10-CM | POA: Diagnosis not present

## 2013-05-31 DIAGNOSIS — I4892 Unspecified atrial flutter: Secondary | ICD-10-CM | POA: Diagnosis not present

## 2013-05-31 DIAGNOSIS — N186 End stage renal disease: Secondary | ICD-10-CM | POA: Diagnosis not present

## 2013-06-04 DIAGNOSIS — I4892 Unspecified atrial flutter: Secondary | ICD-10-CM | POA: Diagnosis not present

## 2013-06-04 DIAGNOSIS — D631 Anemia in chronic kidney disease: Secondary | ICD-10-CM | POA: Diagnosis not present

## 2013-06-04 DIAGNOSIS — I10 Essential (primary) hypertension: Secondary | ICD-10-CM | POA: Diagnosis not present

## 2013-06-04 DIAGNOSIS — Z4802 Encounter for removal of sutures: Secondary | ICD-10-CM | POA: Diagnosis not present

## 2013-06-04 DIAGNOSIS — E1129 Type 2 diabetes mellitus with other diabetic kidney complication: Secondary | ICD-10-CM | POA: Diagnosis not present

## 2013-06-04 DIAGNOSIS — Z48298 Encounter for aftercare following other organ transplant: Secondary | ICD-10-CM | POA: Diagnosis not present

## 2013-06-04 DIAGNOSIS — N039 Chronic nephritic syndrome with unspecified morphologic changes: Secondary | ICD-10-CM | POA: Diagnosis not present

## 2013-06-04 DIAGNOSIS — E785 Hyperlipidemia, unspecified: Secondary | ICD-10-CM | POA: Diagnosis not present

## 2013-06-04 DIAGNOSIS — Z940 Kidney transplant status: Secondary | ICD-10-CM | POA: Diagnosis not present

## 2013-06-04 DIAGNOSIS — IMO0002 Reserved for concepts with insufficient information to code with codable children: Secondary | ICD-10-CM | POA: Diagnosis not present

## 2013-06-04 DIAGNOSIS — Z298 Encounter for other specified prophylactic measures: Secondary | ICD-10-CM | POA: Diagnosis not present

## 2013-06-04 DIAGNOSIS — Z792 Long term (current) use of antibiotics: Secondary | ICD-10-CM | POA: Diagnosis not present

## 2013-06-04 DIAGNOSIS — Z79899 Other long term (current) drug therapy: Secondary | ICD-10-CM | POA: Diagnosis not present

## 2013-06-04 DIAGNOSIS — E119 Type 2 diabetes mellitus without complications: Secondary | ICD-10-CM | POA: Diagnosis not present

## 2013-06-04 DIAGNOSIS — D899 Disorder involving the immune mechanism, unspecified: Secondary | ICD-10-CM | POA: Diagnosis not present

## 2013-06-04 DIAGNOSIS — I120 Hypertensive chronic kidney disease with stage 5 chronic kidney disease or end stage renal disease: Secondary | ICD-10-CM | POA: Diagnosis not present

## 2013-06-04 DIAGNOSIS — N186 End stage renal disease: Secondary | ICD-10-CM | POA: Diagnosis not present

## 2013-06-07 DIAGNOSIS — I120 Hypertensive chronic kidney disease with stage 5 chronic kidney disease or end stage renal disease: Secondary | ICD-10-CM | POA: Diagnosis not present

## 2013-06-07 DIAGNOSIS — Z940 Kidney transplant status: Secondary | ICD-10-CM | POA: Diagnosis not present

## 2013-06-07 DIAGNOSIS — N186 End stage renal disease: Secondary | ICD-10-CM | POA: Diagnosis not present

## 2013-06-07 DIAGNOSIS — I10 Essential (primary) hypertension: Secondary | ICD-10-CM | POA: Diagnosis not present

## 2013-06-07 DIAGNOSIS — R269 Unspecified abnormalities of gait and mobility: Secondary | ICD-10-CM | POA: Diagnosis not present

## 2013-06-07 DIAGNOSIS — IMO0001 Reserved for inherently not codable concepts without codable children: Secondary | ICD-10-CM | POA: Diagnosis not present

## 2013-06-07 DIAGNOSIS — Z48298 Encounter for aftercare following other organ transplant: Secondary | ICD-10-CM | POA: Diagnosis not present

## 2013-06-07 DIAGNOSIS — E1129 Type 2 diabetes mellitus with other diabetic kidney complication: Secondary | ICD-10-CM | POA: Diagnosis not present

## 2013-06-08 DIAGNOSIS — R269 Unspecified abnormalities of gait and mobility: Secondary | ICD-10-CM | POA: Diagnosis not present

## 2013-06-08 DIAGNOSIS — N186 End stage renal disease: Secondary | ICD-10-CM | POA: Diagnosis not present

## 2013-06-08 DIAGNOSIS — E1129 Type 2 diabetes mellitus with other diabetic kidney complication: Secondary | ICD-10-CM | POA: Diagnosis not present

## 2013-06-08 DIAGNOSIS — Z940 Kidney transplant status: Secondary | ICD-10-CM | POA: Diagnosis not present

## 2013-06-08 DIAGNOSIS — Z48298 Encounter for aftercare following other organ transplant: Secondary | ICD-10-CM | POA: Diagnosis not present

## 2013-06-08 DIAGNOSIS — I120 Hypertensive chronic kidney disease with stage 5 chronic kidney disease or end stage renal disease: Secondary | ICD-10-CM | POA: Diagnosis not present

## 2013-06-11 DIAGNOSIS — E872 Acidosis, unspecified: Secondary | ICD-10-CM | POA: Diagnosis not present

## 2013-06-11 DIAGNOSIS — I10 Essential (primary) hypertension: Secondary | ICD-10-CM | POA: Diagnosis not present

## 2013-06-11 DIAGNOSIS — Z794 Long term (current) use of insulin: Secondary | ICD-10-CM | POA: Diagnosis not present

## 2013-06-11 DIAGNOSIS — D649 Anemia, unspecified: Secondary | ICD-10-CM | POA: Diagnosis not present

## 2013-06-11 DIAGNOSIS — Z7901 Long term (current) use of anticoagulants: Secondary | ICD-10-CM | POA: Diagnosis not present

## 2013-06-11 DIAGNOSIS — Z48298 Encounter for aftercare following other organ transplant: Secondary | ICD-10-CM | POA: Diagnosis not present

## 2013-06-11 DIAGNOSIS — Z5181 Encounter for therapeutic drug level monitoring: Secondary | ICD-10-CM | POA: Diagnosis not present

## 2013-06-11 DIAGNOSIS — Z940 Kidney transplant status: Secondary | ICD-10-CM | POA: Diagnosis not present

## 2013-06-11 DIAGNOSIS — E119 Type 2 diabetes mellitus without complications: Secondary | ICD-10-CM | POA: Diagnosis not present

## 2013-06-11 DIAGNOSIS — Z79899 Other long term (current) drug therapy: Secondary | ICD-10-CM | POA: Diagnosis not present

## 2013-06-12 DIAGNOSIS — R269 Unspecified abnormalities of gait and mobility: Secondary | ICD-10-CM | POA: Diagnosis not present

## 2013-06-12 DIAGNOSIS — E1129 Type 2 diabetes mellitus with other diabetic kidney complication: Secondary | ICD-10-CM | POA: Diagnosis not present

## 2013-06-12 DIAGNOSIS — Z48298 Encounter for aftercare following other organ transplant: Secondary | ICD-10-CM | POA: Diagnosis not present

## 2013-06-12 DIAGNOSIS — N186 End stage renal disease: Secondary | ICD-10-CM | POA: Diagnosis not present

## 2013-06-12 DIAGNOSIS — Z940 Kidney transplant status: Secondary | ICD-10-CM | POA: Diagnosis not present

## 2013-06-12 DIAGNOSIS — I120 Hypertensive chronic kidney disease with stage 5 chronic kidney disease or end stage renal disease: Secondary | ICD-10-CM | POA: Diagnosis not present

## 2013-06-14 DIAGNOSIS — Z940 Kidney transplant status: Secondary | ICD-10-CM | POA: Diagnosis not present

## 2013-06-14 DIAGNOSIS — E119 Type 2 diabetes mellitus without complications: Secondary | ICD-10-CM | POA: Diagnosis not present

## 2013-06-14 DIAGNOSIS — Z7901 Long term (current) use of anticoagulants: Secondary | ICD-10-CM | POA: Diagnosis not present

## 2013-06-14 DIAGNOSIS — N170 Acute kidney failure with tubular necrosis: Secondary | ICD-10-CM | POA: Diagnosis not present

## 2013-06-14 DIAGNOSIS — Z7982 Long term (current) use of aspirin: Secondary | ICD-10-CM | POA: Diagnosis not present

## 2013-06-14 DIAGNOSIS — Z48298 Encounter for aftercare following other organ transplant: Secondary | ICD-10-CM | POA: Diagnosis not present

## 2013-06-14 DIAGNOSIS — Z79899 Other long term (current) drug therapy: Secondary | ICD-10-CM | POA: Diagnosis not present

## 2013-06-14 DIAGNOSIS — Z794 Long term (current) use of insulin: Secondary | ICD-10-CM | POA: Diagnosis not present

## 2013-06-14 DIAGNOSIS — IMO0002 Reserved for concepts with insufficient information to code with codable children: Secondary | ICD-10-CM | POA: Diagnosis not present

## 2013-06-14 DIAGNOSIS — I4892 Unspecified atrial flutter: Secondary | ICD-10-CM | POA: Diagnosis not present

## 2013-06-14 DIAGNOSIS — I10 Essential (primary) hypertension: Secondary | ICD-10-CM | POA: Diagnosis not present

## 2013-06-15 DIAGNOSIS — I120 Hypertensive chronic kidney disease with stage 5 chronic kidney disease or end stage renal disease: Secondary | ICD-10-CM | POA: Diagnosis not present

## 2013-06-15 DIAGNOSIS — R269 Unspecified abnormalities of gait and mobility: Secondary | ICD-10-CM | POA: Diagnosis not present

## 2013-06-15 DIAGNOSIS — Z940 Kidney transplant status: Secondary | ICD-10-CM | POA: Diagnosis not present

## 2013-06-15 DIAGNOSIS — N186 End stage renal disease: Secondary | ICD-10-CM | POA: Diagnosis not present

## 2013-06-15 DIAGNOSIS — E1129 Type 2 diabetes mellitus with other diabetic kidney complication: Secondary | ICD-10-CM | POA: Diagnosis not present

## 2013-06-15 DIAGNOSIS — Z48298 Encounter for aftercare following other organ transplant: Secondary | ICD-10-CM | POA: Diagnosis not present

## 2013-06-18 DIAGNOSIS — IMO0001 Reserved for inherently not codable concepts without codable children: Secondary | ICD-10-CM | POA: Diagnosis not present

## 2013-06-18 DIAGNOSIS — N186 End stage renal disease: Secondary | ICD-10-CM | POA: Diagnosis not present

## 2013-06-18 DIAGNOSIS — Z48298 Encounter for aftercare following other organ transplant: Secondary | ICD-10-CM | POA: Diagnosis not present

## 2013-06-18 DIAGNOSIS — D631 Anemia in chronic kidney disease: Secondary | ICD-10-CM | POA: Diagnosis not present

## 2013-06-18 DIAGNOSIS — Z940 Kidney transplant status: Secondary | ICD-10-CM | POA: Diagnosis not present

## 2013-06-18 DIAGNOSIS — Z792 Long term (current) use of antibiotics: Secondary | ICD-10-CM | POA: Diagnosis not present

## 2013-06-18 DIAGNOSIS — I4892 Unspecified atrial flutter: Secondary | ICD-10-CM | POA: Diagnosis not present

## 2013-06-18 DIAGNOSIS — I120 Hypertensive chronic kidney disease with stage 5 chronic kidney disease or end stage renal disease: Secondary | ICD-10-CM | POA: Diagnosis not present

## 2013-06-18 DIAGNOSIS — I10 Essential (primary) hypertension: Secondary | ICD-10-CM | POA: Diagnosis not present

## 2013-06-18 DIAGNOSIS — Z298 Encounter for other specified prophylactic measures: Secondary | ICD-10-CM | POA: Diagnosis not present

## 2013-06-18 DIAGNOSIS — E119 Type 2 diabetes mellitus without complications: Secondary | ICD-10-CM | POA: Diagnosis not present

## 2013-06-18 DIAGNOSIS — D899 Disorder involving the immune mechanism, unspecified: Secondary | ICD-10-CM | POA: Diagnosis not present

## 2013-06-18 DIAGNOSIS — Z794 Long term (current) use of insulin: Secondary | ICD-10-CM | POA: Diagnosis not present

## 2013-06-18 DIAGNOSIS — E1129 Type 2 diabetes mellitus with other diabetic kidney complication: Secondary | ICD-10-CM | POA: Diagnosis not present

## 2013-06-18 DIAGNOSIS — Z79899 Other long term (current) drug therapy: Secondary | ICD-10-CM | POA: Diagnosis not present

## 2013-06-18 DIAGNOSIS — IMO0002 Reserved for concepts with insufficient information to code with codable children: Secondary | ICD-10-CM | POA: Diagnosis not present

## 2013-06-19 DIAGNOSIS — I120 Hypertensive chronic kidney disease with stage 5 chronic kidney disease or end stage renal disease: Secondary | ICD-10-CM | POA: Diagnosis not present

## 2013-06-19 DIAGNOSIS — Z48298 Encounter for aftercare following other organ transplant: Secondary | ICD-10-CM | POA: Diagnosis not present

## 2013-06-19 DIAGNOSIS — R269 Unspecified abnormalities of gait and mobility: Secondary | ICD-10-CM | POA: Diagnosis not present

## 2013-06-19 DIAGNOSIS — Z940 Kidney transplant status: Secondary | ICD-10-CM | POA: Diagnosis not present

## 2013-06-19 DIAGNOSIS — N186 End stage renal disease: Secondary | ICD-10-CM | POA: Diagnosis not present

## 2013-06-19 DIAGNOSIS — E1129 Type 2 diabetes mellitus with other diabetic kidney complication: Secondary | ICD-10-CM | POA: Diagnosis not present

## 2013-06-21 DIAGNOSIS — I120 Hypertensive chronic kidney disease with stage 5 chronic kidney disease or end stage renal disease: Secondary | ICD-10-CM | POA: Diagnosis not present

## 2013-06-21 DIAGNOSIS — Z940 Kidney transplant status: Secondary | ICD-10-CM | POA: Diagnosis not present

## 2013-06-21 DIAGNOSIS — R269 Unspecified abnormalities of gait and mobility: Secondary | ICD-10-CM | POA: Diagnosis not present

## 2013-06-21 DIAGNOSIS — Z48298 Encounter for aftercare following other organ transplant: Secondary | ICD-10-CM | POA: Diagnosis not present

## 2013-06-21 DIAGNOSIS — N186 End stage renal disease: Secondary | ICD-10-CM | POA: Diagnosis not present

## 2013-06-21 DIAGNOSIS — E1129 Type 2 diabetes mellitus with other diabetic kidney complication: Secondary | ICD-10-CM | POA: Diagnosis not present

## 2013-06-25 DIAGNOSIS — E1129 Type 2 diabetes mellitus with other diabetic kidney complication: Secondary | ICD-10-CM | POA: Diagnosis not present

## 2013-06-25 DIAGNOSIS — Z79899 Other long term (current) drug therapy: Secondary | ICD-10-CM | POA: Diagnosis not present

## 2013-06-25 DIAGNOSIS — Z48298 Encounter for aftercare following other organ transplant: Secondary | ICD-10-CM | POA: Diagnosis not present

## 2013-06-25 DIAGNOSIS — D899 Disorder involving the immune mechanism, unspecified: Secondary | ICD-10-CM | POA: Diagnosis not present

## 2013-06-25 DIAGNOSIS — Z940 Kidney transplant status: Secondary | ICD-10-CM | POA: Diagnosis not present

## 2013-06-25 DIAGNOSIS — I10 Essential (primary) hypertension: Secondary | ICD-10-CM | POA: Diagnosis not present

## 2013-06-25 DIAGNOSIS — E119 Type 2 diabetes mellitus without complications: Secondary | ICD-10-CM | POA: Diagnosis not present

## 2013-06-25 DIAGNOSIS — Z5181 Encounter for therapeutic drug level monitoring: Secondary | ICD-10-CM | POA: Diagnosis not present

## 2013-06-25 DIAGNOSIS — I4892 Unspecified atrial flutter: Secondary | ICD-10-CM | POA: Diagnosis not present

## 2013-06-28 DIAGNOSIS — E1129 Type 2 diabetes mellitus with other diabetic kidney complication: Secondary | ICD-10-CM | POA: Diagnosis not present

## 2013-06-28 DIAGNOSIS — I120 Hypertensive chronic kidney disease with stage 5 chronic kidney disease or end stage renal disease: Secondary | ICD-10-CM | POA: Diagnosis not present

## 2013-06-28 DIAGNOSIS — Z48298 Encounter for aftercare following other organ transplant: Secondary | ICD-10-CM | POA: Diagnosis not present

## 2013-06-28 DIAGNOSIS — Z940 Kidney transplant status: Secondary | ICD-10-CM | POA: Diagnosis not present

## 2013-06-28 DIAGNOSIS — N186 End stage renal disease: Secondary | ICD-10-CM | POA: Diagnosis not present

## 2013-06-28 DIAGNOSIS — R269 Unspecified abnormalities of gait and mobility: Secondary | ICD-10-CM | POA: Diagnosis not present

## 2013-07-02 DIAGNOSIS — I4892 Unspecified atrial flutter: Secondary | ICD-10-CM | POA: Diagnosis not present

## 2013-07-02 DIAGNOSIS — E785 Hyperlipidemia, unspecified: Secondary | ICD-10-CM | POA: Diagnosis not present

## 2013-07-02 DIAGNOSIS — E119 Type 2 diabetes mellitus without complications: Secondary | ICD-10-CM | POA: Diagnosis not present

## 2013-07-02 DIAGNOSIS — I10 Essential (primary) hypertension: Secondary | ICD-10-CM | POA: Diagnosis not present

## 2013-07-02 DIAGNOSIS — I4891 Unspecified atrial fibrillation: Secondary | ICD-10-CM | POA: Diagnosis not present

## 2013-07-02 DIAGNOSIS — E872 Acidosis, unspecified: Secondary | ICD-10-CM | POA: Diagnosis not present

## 2013-07-02 DIAGNOSIS — N186 End stage renal disease: Secondary | ICD-10-CM | POA: Diagnosis not present

## 2013-07-02 DIAGNOSIS — D899 Disorder involving the immune mechanism, unspecified: Secondary | ICD-10-CM | POA: Diagnosis not present

## 2013-07-02 DIAGNOSIS — E1129 Type 2 diabetes mellitus with other diabetic kidney complication: Secondary | ICD-10-CM | POA: Diagnosis not present

## 2013-07-02 DIAGNOSIS — D631 Anemia in chronic kidney disease: Secondary | ICD-10-CM | POA: Diagnosis not present

## 2013-07-02 DIAGNOSIS — I120 Hypertensive chronic kidney disease with stage 5 chronic kidney disease or end stage renal disease: Secondary | ICD-10-CM | POA: Diagnosis not present

## 2013-07-02 DIAGNOSIS — Z940 Kidney transplant status: Secondary | ICD-10-CM | POA: Diagnosis not present

## 2013-07-02 DIAGNOSIS — Z5181 Encounter for therapeutic drug level monitoring: Secondary | ICD-10-CM | POA: Diagnosis not present

## 2013-07-02 DIAGNOSIS — Z48298 Encounter for aftercare following other organ transplant: Secondary | ICD-10-CM | POA: Diagnosis not present

## 2013-07-02 DIAGNOSIS — Z79899 Other long term (current) drug therapy: Secondary | ICD-10-CM | POA: Diagnosis not present

## 2013-07-04 DIAGNOSIS — E1129 Type 2 diabetes mellitus with other diabetic kidney complication: Secondary | ICD-10-CM | POA: Diagnosis not present

## 2013-07-04 DIAGNOSIS — Z48298 Encounter for aftercare following other organ transplant: Secondary | ICD-10-CM | POA: Diagnosis not present

## 2013-07-04 DIAGNOSIS — I120 Hypertensive chronic kidney disease with stage 5 chronic kidney disease or end stage renal disease: Secondary | ICD-10-CM | POA: Diagnosis not present

## 2013-07-04 DIAGNOSIS — R269 Unspecified abnormalities of gait and mobility: Secondary | ICD-10-CM | POA: Diagnosis not present

## 2013-07-04 DIAGNOSIS — N186 End stage renal disease: Secondary | ICD-10-CM | POA: Diagnosis not present

## 2013-07-04 DIAGNOSIS — Z940 Kidney transplant status: Secondary | ICD-10-CM | POA: Diagnosis not present

## 2013-07-06 DIAGNOSIS — R269 Unspecified abnormalities of gait and mobility: Secondary | ICD-10-CM | POA: Diagnosis not present

## 2013-07-06 DIAGNOSIS — Z48298 Encounter for aftercare following other organ transplant: Secondary | ICD-10-CM | POA: Diagnosis not present

## 2013-07-06 DIAGNOSIS — E1129 Type 2 diabetes mellitus with other diabetic kidney complication: Secondary | ICD-10-CM | POA: Diagnosis not present

## 2013-07-06 DIAGNOSIS — I120 Hypertensive chronic kidney disease with stage 5 chronic kidney disease or end stage renal disease: Secondary | ICD-10-CM | POA: Diagnosis not present

## 2013-07-06 DIAGNOSIS — Z940 Kidney transplant status: Secondary | ICD-10-CM | POA: Diagnosis not present

## 2013-07-06 DIAGNOSIS — N186 End stage renal disease: Secondary | ICD-10-CM | POA: Diagnosis not present

## 2013-07-09 DIAGNOSIS — I10 Essential (primary) hypertension: Secondary | ICD-10-CM | POA: Diagnosis not present

## 2013-07-09 DIAGNOSIS — E119 Type 2 diabetes mellitus without complications: Secondary | ICD-10-CM | POA: Diagnosis not present

## 2013-07-09 DIAGNOSIS — Z940 Kidney transplant status: Secondary | ICD-10-CM | POA: Diagnosis not present

## 2013-07-09 DIAGNOSIS — K5900 Constipation, unspecified: Secondary | ICD-10-CM | POA: Diagnosis not present

## 2013-07-09 DIAGNOSIS — Z48298 Encounter for aftercare following other organ transplant: Secondary | ICD-10-CM | POA: Diagnosis not present

## 2013-07-09 DIAGNOSIS — E1129 Type 2 diabetes mellitus with other diabetic kidney complication: Secondary | ICD-10-CM | POA: Diagnosis not present

## 2013-07-09 DIAGNOSIS — D899 Disorder involving the immune mechanism, unspecified: Secondary | ICD-10-CM | POA: Diagnosis not present

## 2013-07-10 DIAGNOSIS — I4892 Unspecified atrial flutter: Secondary | ICD-10-CM | POA: Diagnosis not present

## 2013-07-10 DIAGNOSIS — R9431 Abnormal electrocardiogram [ECG] [EKG]: Secondary | ICD-10-CM | POA: Diagnosis not present

## 2013-07-10 DIAGNOSIS — Z7901 Long term (current) use of anticoagulants: Secondary | ICD-10-CM | POA: Diagnosis not present

## 2013-07-11 DIAGNOSIS — N186 End stage renal disease: Secondary | ICD-10-CM | POA: Diagnosis not present

## 2013-07-11 DIAGNOSIS — Z48298 Encounter for aftercare following other organ transplant: Secondary | ICD-10-CM | POA: Diagnosis not present

## 2013-07-11 DIAGNOSIS — Z940 Kidney transplant status: Secondary | ICD-10-CM | POA: Diagnosis not present

## 2013-07-11 DIAGNOSIS — E1129 Type 2 diabetes mellitus with other diabetic kidney complication: Secondary | ICD-10-CM | POA: Diagnosis not present

## 2013-07-11 DIAGNOSIS — I120 Hypertensive chronic kidney disease with stage 5 chronic kidney disease or end stage renal disease: Secondary | ICD-10-CM | POA: Diagnosis not present

## 2013-07-11 DIAGNOSIS — R269 Unspecified abnormalities of gait and mobility: Secondary | ICD-10-CM | POA: Diagnosis not present

## 2013-07-12 DIAGNOSIS — N186 End stage renal disease: Secondary | ICD-10-CM | POA: Diagnosis not present

## 2013-07-12 DIAGNOSIS — Z48298 Encounter for aftercare following other organ transplant: Secondary | ICD-10-CM | POA: Diagnosis not present

## 2013-07-12 DIAGNOSIS — I120 Hypertensive chronic kidney disease with stage 5 chronic kidney disease or end stage renal disease: Secondary | ICD-10-CM | POA: Diagnosis not present

## 2013-07-12 DIAGNOSIS — E1129 Type 2 diabetes mellitus with other diabetic kidney complication: Secondary | ICD-10-CM | POA: Diagnosis not present

## 2013-07-12 DIAGNOSIS — Z940 Kidney transplant status: Secondary | ICD-10-CM | POA: Diagnosis not present

## 2013-07-12 DIAGNOSIS — R269 Unspecified abnormalities of gait and mobility: Secondary | ICD-10-CM | POA: Diagnosis not present

## 2013-07-13 DIAGNOSIS — N186 End stage renal disease: Secondary | ICD-10-CM | POA: Diagnosis not present

## 2013-07-13 DIAGNOSIS — Z48298 Encounter for aftercare following other organ transplant: Secondary | ICD-10-CM | POA: Diagnosis not present

## 2013-07-13 DIAGNOSIS — Z940 Kidney transplant status: Secondary | ICD-10-CM | POA: Diagnosis not present

## 2013-07-13 DIAGNOSIS — E1129 Type 2 diabetes mellitus with other diabetic kidney complication: Secondary | ICD-10-CM | POA: Diagnosis not present

## 2013-07-13 DIAGNOSIS — R269 Unspecified abnormalities of gait and mobility: Secondary | ICD-10-CM | POA: Diagnosis not present

## 2013-07-13 DIAGNOSIS — I120 Hypertensive chronic kidney disease with stage 5 chronic kidney disease or end stage renal disease: Secondary | ICD-10-CM | POA: Diagnosis not present

## 2013-07-16 DIAGNOSIS — I4892 Unspecified atrial flutter: Secondary | ICD-10-CM | POA: Diagnosis not present

## 2013-07-16 DIAGNOSIS — I10 Essential (primary) hypertension: Secondary | ICD-10-CM | POA: Diagnosis not present

## 2013-07-16 DIAGNOSIS — E875 Hyperkalemia: Secondary | ICD-10-CM | POA: Diagnosis not present

## 2013-07-16 DIAGNOSIS — K5900 Constipation, unspecified: Secondary | ICD-10-CM | POA: Diagnosis not present

## 2013-07-16 DIAGNOSIS — D899 Disorder involving the immune mechanism, unspecified: Secondary | ICD-10-CM | POA: Diagnosis not present

## 2013-07-16 DIAGNOSIS — E119 Type 2 diabetes mellitus without complications: Secondary | ICD-10-CM | POA: Diagnosis not present

## 2013-07-16 DIAGNOSIS — Z940 Kidney transplant status: Secondary | ICD-10-CM | POA: Diagnosis not present

## 2013-07-16 DIAGNOSIS — Z48298 Encounter for aftercare following other organ transplant: Secondary | ICD-10-CM | POA: Diagnosis not present

## 2013-07-17 DIAGNOSIS — Z48298 Encounter for aftercare following other organ transplant: Secondary | ICD-10-CM | POA: Diagnosis not present

## 2013-07-17 DIAGNOSIS — I120 Hypertensive chronic kidney disease with stage 5 chronic kidney disease or end stage renal disease: Secondary | ICD-10-CM | POA: Diagnosis not present

## 2013-07-17 DIAGNOSIS — Z940 Kidney transplant status: Secondary | ICD-10-CM | POA: Diagnosis not present

## 2013-07-17 DIAGNOSIS — E1129 Type 2 diabetes mellitus with other diabetic kidney complication: Secondary | ICD-10-CM | POA: Diagnosis not present

## 2013-07-17 DIAGNOSIS — N186 End stage renal disease: Secondary | ICD-10-CM | POA: Diagnosis not present

## 2013-07-17 DIAGNOSIS — R269 Unspecified abnormalities of gait and mobility: Secondary | ICD-10-CM | POA: Diagnosis not present

## 2013-07-23 DIAGNOSIS — N186 End stage renal disease: Secondary | ICD-10-CM | POA: Diagnosis not present

## 2013-07-23 DIAGNOSIS — E119 Type 2 diabetes mellitus without complications: Secondary | ICD-10-CM | POA: Diagnosis not present

## 2013-07-23 DIAGNOSIS — E1129 Type 2 diabetes mellitus with other diabetic kidney complication: Secondary | ICD-10-CM | POA: Diagnosis not present

## 2013-07-23 DIAGNOSIS — I120 Hypertensive chronic kidney disease with stage 5 chronic kidney disease or end stage renal disease: Secondary | ICD-10-CM | POA: Diagnosis not present

## 2013-07-23 DIAGNOSIS — R630 Anorexia: Secondary | ICD-10-CM | POA: Diagnosis not present

## 2013-07-23 DIAGNOSIS — R634 Abnormal weight loss: Secondary | ICD-10-CM | POA: Diagnosis not present

## 2013-07-23 DIAGNOSIS — Z48298 Encounter for aftercare following other organ transplant: Secondary | ICD-10-CM | POA: Diagnosis not present

## 2013-07-23 DIAGNOSIS — I10 Essential (primary) hypertension: Secondary | ICD-10-CM | POA: Diagnosis not present

## 2013-07-23 DIAGNOSIS — D899 Disorder involving the immune mechanism, unspecified: Secondary | ICD-10-CM | POA: Diagnosis not present

## 2013-07-23 DIAGNOSIS — Z940 Kidney transplant status: Secondary | ICD-10-CM | POA: Diagnosis not present

## 2013-07-23 DIAGNOSIS — I4892 Unspecified atrial flutter: Secondary | ICD-10-CM | POA: Diagnosis not present

## 2013-07-27 DIAGNOSIS — N186 End stage renal disease: Secondary | ICD-10-CM | POA: Diagnosis not present

## 2013-07-27 DIAGNOSIS — Z940 Kidney transplant status: Secondary | ICD-10-CM | POA: Diagnosis not present

## 2013-07-30 DIAGNOSIS — I10 Essential (primary) hypertension: Secondary | ICD-10-CM | POA: Diagnosis not present

## 2013-07-30 DIAGNOSIS — Z48298 Encounter for aftercare following other organ transplant: Secondary | ICD-10-CM | POA: Diagnosis not present

## 2013-07-30 DIAGNOSIS — Z940 Kidney transplant status: Secondary | ICD-10-CM | POA: Diagnosis not present

## 2013-07-30 DIAGNOSIS — Z79899 Other long term (current) drug therapy: Secondary | ICD-10-CM | POA: Diagnosis not present

## 2013-07-30 DIAGNOSIS — I4892 Unspecified atrial flutter: Secondary | ICD-10-CM | POA: Diagnosis not present

## 2013-07-30 DIAGNOSIS — IMO0001 Reserved for inherently not codable concepts without codable children: Secondary | ICD-10-CM | POA: Diagnosis not present

## 2013-08-06 DIAGNOSIS — D899 Disorder involving the immune mechanism, unspecified: Secondary | ICD-10-CM | POA: Diagnosis not present

## 2013-08-06 DIAGNOSIS — Z48298 Encounter for aftercare following other organ transplant: Secondary | ICD-10-CM | POA: Diagnosis not present

## 2013-08-06 DIAGNOSIS — E1129 Type 2 diabetes mellitus with other diabetic kidney complication: Secondary | ICD-10-CM | POA: Diagnosis not present

## 2013-08-06 DIAGNOSIS — I4892 Unspecified atrial flutter: Secondary | ICD-10-CM | POA: Diagnosis not present

## 2013-08-06 DIAGNOSIS — Z940 Kidney transplant status: Secondary | ICD-10-CM | POA: Diagnosis not present

## 2013-08-06 DIAGNOSIS — Z7901 Long term (current) use of anticoagulants: Secondary | ICD-10-CM | POA: Diagnosis not present

## 2013-08-06 DIAGNOSIS — I10 Essential (primary) hypertension: Secondary | ICD-10-CM | POA: Diagnosis not present

## 2013-08-06 DIAGNOSIS — E119 Type 2 diabetes mellitus without complications: Secondary | ICD-10-CM | POA: Diagnosis not present

## 2013-08-20 DIAGNOSIS — I10 Essential (primary) hypertension: Secondary | ICD-10-CM | POA: Diagnosis not present

## 2013-08-20 DIAGNOSIS — Z940 Kidney transplant status: Secondary | ICD-10-CM | POA: Diagnosis not present

## 2013-08-20 DIAGNOSIS — Z48298 Encounter for aftercare following other organ transplant: Secondary | ICD-10-CM | POA: Diagnosis not present

## 2013-08-20 DIAGNOSIS — E872 Acidosis, unspecified: Secondary | ICD-10-CM | POA: Diagnosis not present

## 2013-08-20 DIAGNOSIS — E1129 Type 2 diabetes mellitus with other diabetic kidney complication: Secondary | ICD-10-CM | POA: Diagnosis not present

## 2013-08-20 DIAGNOSIS — E875 Hyperkalemia: Secondary | ICD-10-CM | POA: Diagnosis not present

## 2013-08-20 DIAGNOSIS — Z79899 Other long term (current) drug therapy: Secondary | ICD-10-CM | POA: Diagnosis not present

## 2013-08-20 DIAGNOSIS — I4891 Unspecified atrial fibrillation: Secondary | ICD-10-CM | POA: Diagnosis not present

## 2013-08-20 DIAGNOSIS — IMO0001 Reserved for inherently not codable concepts without codable children: Secondary | ICD-10-CM | POA: Diagnosis not present

## 2013-08-20 DIAGNOSIS — Z794 Long term (current) use of insulin: Secondary | ICD-10-CM | POA: Diagnosis not present

## 2013-08-20 DIAGNOSIS — D899 Disorder involving the immune mechanism, unspecified: Secondary | ICD-10-CM | POA: Diagnosis not present

## 2013-09-03 DIAGNOSIS — I10 Essential (primary) hypertension: Secondary | ICD-10-CM | POA: Diagnosis not present

## 2013-09-03 DIAGNOSIS — D899 Disorder involving the immune mechanism, unspecified: Secondary | ICD-10-CM | POA: Diagnosis not present

## 2013-09-03 DIAGNOSIS — Z7901 Long term (current) use of anticoagulants: Secondary | ICD-10-CM | POA: Diagnosis not present

## 2013-09-03 DIAGNOSIS — Z940 Kidney transplant status: Secondary | ICD-10-CM | POA: Diagnosis not present

## 2013-09-03 DIAGNOSIS — E1129 Type 2 diabetes mellitus with other diabetic kidney complication: Secondary | ICD-10-CM | POA: Diagnosis not present

## 2013-09-03 DIAGNOSIS — Z5181 Encounter for therapeutic drug level monitoring: Secondary | ICD-10-CM | POA: Diagnosis not present

## 2013-09-03 DIAGNOSIS — E1142 Type 2 diabetes mellitus with diabetic polyneuropathy: Secondary | ICD-10-CM | POA: Diagnosis not present

## 2013-09-03 DIAGNOSIS — Z48298 Encounter for aftercare following other organ transplant: Secondary | ICD-10-CM | POA: Diagnosis not present

## 2013-09-03 DIAGNOSIS — E1149 Type 2 diabetes mellitus with other diabetic neurological complication: Secondary | ICD-10-CM | POA: Diagnosis not present

## 2013-09-03 DIAGNOSIS — Z79899 Other long term (current) drug therapy: Secondary | ICD-10-CM | POA: Diagnosis not present

## 2013-09-20 DIAGNOSIS — D899 Disorder involving the immune mechanism, unspecified: Secondary | ICD-10-CM | POA: Diagnosis not present

## 2013-09-20 DIAGNOSIS — Z940 Kidney transplant status: Secondary | ICD-10-CM | POA: Diagnosis not present

## 2013-09-20 DIAGNOSIS — Z79899 Other long term (current) drug therapy: Secondary | ICD-10-CM | POA: Diagnosis not present

## 2013-10-05 DIAGNOSIS — M1710 Unilateral primary osteoarthritis, unspecified knee: Secondary | ICD-10-CM | POA: Diagnosis not present

## 2013-10-05 DIAGNOSIS — E1129 Type 2 diabetes mellitus with other diabetic kidney complication: Secondary | ICD-10-CM | POA: Diagnosis not present

## 2013-10-05 DIAGNOSIS — I10 Essential (primary) hypertension: Secondary | ICD-10-CM | POA: Diagnosis not present

## 2013-10-05 DIAGNOSIS — N186 End stage renal disease: Secondary | ICD-10-CM | POA: Diagnosis not present

## 2013-10-05 DIAGNOSIS — E1165 Type 2 diabetes mellitus with hyperglycemia: Secondary | ICD-10-CM | POA: Diagnosis not present

## 2013-10-08 DIAGNOSIS — Z794 Long term (current) use of insulin: Secondary | ICD-10-CM | POA: Diagnosis not present

## 2013-10-08 DIAGNOSIS — I4892 Unspecified atrial flutter: Secondary | ICD-10-CM | POA: Diagnosis not present

## 2013-10-08 DIAGNOSIS — E1129 Type 2 diabetes mellitus with other diabetic kidney complication: Secondary | ICD-10-CM | POA: Diagnosis not present

## 2013-10-08 DIAGNOSIS — I10 Essential (primary) hypertension: Secondary | ICD-10-CM | POA: Diagnosis not present

## 2013-10-08 DIAGNOSIS — Z79899 Other long term (current) drug therapy: Secondary | ICD-10-CM | POA: Diagnosis not present

## 2013-10-08 DIAGNOSIS — E785 Hyperlipidemia, unspecified: Secondary | ICD-10-CM | POA: Diagnosis not present

## 2013-10-08 DIAGNOSIS — Z940 Kidney transplant status: Secondary | ICD-10-CM | POA: Diagnosis not present

## 2013-10-08 DIAGNOSIS — D899 Disorder involving the immune mechanism, unspecified: Secondary | ICD-10-CM | POA: Diagnosis not present

## 2013-10-16 ENCOUNTER — Inpatient Hospital Stay (HOSPITAL_COMMUNITY)
Admission: EM | Admit: 2013-10-16 | Discharge: 2013-10-19 | DRG: 389 | Disposition: A | Discharge status: Home / Self Care | Payer: Medicare Other | Attending: Internal Medicine | Admitting: Internal Medicine

## 2013-10-16 ENCOUNTER — Encounter (HOSPITAL_COMMUNITY): Payer: Self-pay | Admitting: Emergency Medicine

## 2013-10-16 ENCOUNTER — Emergency Department (HOSPITAL_COMMUNITY): Payer: Medicare Other

## 2013-10-16 ENCOUNTER — Inpatient Hospital Stay (HOSPITAL_COMMUNITY): Payer: Medicare Other

## 2013-10-16 DIAGNOSIS — K56609 Unspecified intestinal obstruction, unspecified as to partial versus complete obstruction: Secondary | ICD-10-CM | POA: Diagnosis present

## 2013-10-16 DIAGNOSIS — Z940 Kidney transplant status: Secondary | ICD-10-CM | POA: Diagnosis not present

## 2013-10-16 DIAGNOSIS — I4892 Unspecified atrial flutter: Secondary | ICD-10-CM | POA: Diagnosis present

## 2013-10-16 DIAGNOSIS — N179 Acute kidney failure, unspecified: Secondary | ICD-10-CM | POA: Diagnosis not present

## 2013-10-16 DIAGNOSIS — Z992 Dependence on renal dialysis: Secondary | ICD-10-CM | POA: Diagnosis not present

## 2013-10-16 DIAGNOSIS — K5903 Drug induced constipation: Secondary | ICD-10-CM

## 2013-10-16 DIAGNOSIS — I509 Heart failure, unspecified: Secondary | ICD-10-CM | POA: Diagnosis not present

## 2013-10-16 DIAGNOSIS — E1165 Type 2 diabetes mellitus with hyperglycemia: Secondary | ICD-10-CM | POA: Diagnosis present

## 2013-10-16 DIAGNOSIS — IMO0001 Reserved for inherently not codable concepts without codable children: Secondary | ICD-10-CM | POA: Diagnosis not present

## 2013-10-16 DIAGNOSIS — Z794 Long term (current) use of insulin: Secondary | ICD-10-CM | POA: Diagnosis not present

## 2013-10-16 DIAGNOSIS — Z792 Long term (current) use of antibiotics: Secondary | ICD-10-CM | POA: Diagnosis not present

## 2013-10-16 DIAGNOSIS — I129 Hypertensive chronic kidney disease with stage 1 through stage 4 chronic kidney disease, or unspecified chronic kidney disease: Secondary | ICD-10-CM | POA: Diagnosis present

## 2013-10-16 DIAGNOSIS — R142 Eructation: Secondary | ICD-10-CM | POA: Diagnosis not present

## 2013-10-16 DIAGNOSIS — Z96652 Presence of left artificial knee joint: Secondary | ICD-10-CM

## 2013-10-16 DIAGNOSIS — Z7982 Long term (current) use of aspirin: Secondary | ICD-10-CM | POA: Diagnosis not present

## 2013-10-16 DIAGNOSIS — K5669 Other intestinal obstruction: Secondary | ICD-10-CM | POA: Diagnosis not present

## 2013-10-16 DIAGNOSIS — M7989 Other specified soft tissue disorders: Secondary | ICD-10-CM | POA: Diagnosis not present

## 2013-10-16 DIAGNOSIS — G589 Mononeuropathy, unspecified: Secondary | ICD-10-CM | POA: Diagnosis present

## 2013-10-16 DIAGNOSIS — K2970 Gastritis, unspecified, without bleeding: Secondary | ICD-10-CM | POA: Diagnosis not present

## 2013-10-16 DIAGNOSIS — K219 Gastro-esophageal reflux disease without esophagitis: Secondary | ICD-10-CM | POA: Diagnosis present

## 2013-10-16 DIAGNOSIS — T8610 Unspecified complication of kidney transplant: Secondary | ICD-10-CM | POA: Diagnosis not present

## 2013-10-16 DIAGNOSIS — Z96659 Presence of unspecified artificial knee joint: Secondary | ICD-10-CM | POA: Diagnosis not present

## 2013-10-16 DIAGNOSIS — R1084 Generalized abdominal pain: Secondary | ICD-10-CM

## 2013-10-16 DIAGNOSIS — R52 Pain, unspecified: Secondary | ICD-10-CM

## 2013-10-16 DIAGNOSIS — I4891 Unspecified atrial fibrillation: Secondary | ICD-10-CM | POA: Diagnosis not present

## 2013-10-16 DIAGNOSIS — K565 Intestinal adhesions [bands], unspecified as to partial versus complete obstruction: Secondary | ICD-10-CM

## 2013-10-16 DIAGNOSIS — N189 Chronic kidney disease, unspecified: Secondary | ICD-10-CM | POA: Diagnosis present

## 2013-10-16 DIAGNOSIS — B1920 Unspecified viral hepatitis C without hepatic coma: Secondary | ICD-10-CM | POA: Diagnosis present

## 2013-10-16 DIAGNOSIS — R141 Gas pain: Secondary | ICD-10-CM | POA: Diagnosis not present

## 2013-10-16 DIAGNOSIS — I10 Essential (primary) hypertension: Secondary | ICD-10-CM | POA: Diagnosis not present

## 2013-10-16 DIAGNOSIS — Y830 Surgical operation with transplant of whole organ as the cause of abnormal reaction of the patient, or of later complication, without mention of misadventure at the time of the procedure: Secondary | ICD-10-CM | POA: Diagnosis present

## 2013-10-16 DIAGNOSIS — K5909 Other constipation: Secondary | ICD-10-CM | POA: Diagnosis not present

## 2013-10-16 DIAGNOSIS — R9431 Abnormal electrocardiogram [ECG] [EKG]: Secondary | ICD-10-CM

## 2013-10-16 DIAGNOSIS — IMO0002 Reserved for concepts with insufficient information to code with codable children: Secondary | ICD-10-CM | POA: Diagnosis present

## 2013-10-16 DIAGNOSIS — I120 Hypertensive chronic kidney disease with stage 5 chronic kidney disease or end stage renal disease: Secondary | ICD-10-CM | POA: Diagnosis not present

## 2013-10-16 DIAGNOSIS — Z7901 Long term (current) use of anticoagulants: Secondary | ICD-10-CM | POA: Diagnosis not present

## 2013-10-16 DIAGNOSIS — N186 End stage renal disease: Secondary | ICD-10-CM | POA: Diagnosis present

## 2013-10-16 DIAGNOSIS — I482 Chronic atrial fibrillation, unspecified: Secondary | ICD-10-CM | POA: Diagnosis present

## 2013-10-16 DIAGNOSIS — Z452 Encounter for adjustment and management of vascular access device: Secondary | ICD-10-CM | POA: Diagnosis not present

## 2013-10-16 DIAGNOSIS — R10819 Abdominal tenderness, unspecified site: Secondary | ICD-10-CM | POA: Diagnosis not present

## 2013-10-16 HISTORY — DX: Unspecified intestinal obstruction, unspecified as to partial versus complete obstruction: K56.609

## 2013-10-16 HISTORY — DX: Type 2 diabetes mellitus without complications: E11.9

## 2013-10-16 LAB — I-STAT CHEM 8, ED
BUN: 29 mg/dL — ABNORMAL HIGH (ref 6–23)
Calcium, Ion: 1.1 mmol/L — ABNORMAL LOW (ref 1.12–1.23)
Chloride: 101 mEq/L (ref 96–112)
Creatinine, Ser: 3 mg/dL — ABNORMAL HIGH (ref 0.50–1.35)
Glucose, Bld: 73 mg/dL (ref 70–99)
HCT: 39 % (ref 39.0–52.0)
Hemoglobin: 13.3 g/dL (ref 13.0–17.0)
Potassium: 3.8 mEq/L (ref 3.7–5.3)
Sodium: 133 mEq/L — ABNORMAL LOW (ref 137–147)
TCO2: 28 mmol/L (ref 0–100)

## 2013-10-16 LAB — CBC WITH DIFFERENTIAL/PLATELET
Basophils Absolute: 0 10*3/uL (ref 0.0–0.1)
Basophils Relative: 0 % (ref 0–1)
Eosinophils Absolute: 0.1 10*3/uL (ref 0.0–0.7)
Eosinophils Relative: 2 % (ref 0–5)
HCT: 37.3 % — ABNORMAL LOW (ref 39.0–52.0)
Hemoglobin: 11.5 g/dL — ABNORMAL LOW (ref 13.0–17.0)
Lymphocytes Relative: 10 % — ABNORMAL LOW (ref 12–46)
Lymphs Abs: 0.5 10*3/uL — ABNORMAL LOW (ref 0.7–4.0)
MCH: 25.8 pg — ABNORMAL LOW (ref 26.0–34.0)
MCHC: 30.8 g/dL (ref 30.0–36.0)
MCV: 83.8 fL (ref 78.0–100.0)
Monocytes Absolute: 0.5 10*3/uL (ref 0.1–1.0)
Monocytes Relative: 9 % (ref 3–12)
Neutro Abs: 4.2 10*3/uL (ref 1.7–7.7)
Neutrophils Relative %: 79 % — ABNORMAL HIGH (ref 43–77)
Platelets: 181 10*3/uL (ref 150–400)
RBC: 4.45 MIL/uL (ref 4.22–5.81)
RDW: 14.4 % (ref 11.5–15.5)
WBC: 5.3 10*3/uL (ref 4.0–10.5)

## 2013-10-16 LAB — URINALYSIS, ROUTINE W REFLEX MICROSCOPIC
Bilirubin Urine: NEGATIVE
Glucose, UA: NEGATIVE mg/dL
Hgb urine dipstick: NEGATIVE
Ketones, ur: 15 mg/dL — AB
Leukocytes, UA: NEGATIVE
Nitrite: NEGATIVE
Protein, ur: NEGATIVE mg/dL
Specific Gravity, Urine: 1.013 (ref 1.005–1.030)
Urobilinogen, UA: 1 mg/dL (ref 0.0–1.0)
pH: 8 (ref 5.0–8.0)

## 2013-10-16 LAB — COMPREHENSIVE METABOLIC PANEL
ALT: 32 U/L (ref 0–53)
AST: 36 U/L (ref 0–37)
Albumin: 3.5 g/dL (ref 3.5–5.2)
Alkaline Phosphatase: 88 U/L (ref 39–117)
BUN: 33 mg/dL — ABNORMAL HIGH (ref 6–23)
CO2: 27 mEq/L (ref 19–32)
Calcium: 9.7 mg/dL (ref 8.4–10.5)
Chloride: 95 mEq/L — ABNORMAL LOW (ref 96–112)
Creatinine, Ser: 2.71 mg/dL — ABNORMAL HIGH (ref 0.50–1.35)
GFR calc Af Amer: 28 mL/min — ABNORMAL LOW (ref >90)
GFR calc non Af Amer: 24 mL/min — ABNORMAL LOW (ref >90)
Glucose, Bld: 75 mg/dL (ref 70–99)
Potassium: 4.2 mEq/L (ref 3.7–5.3)
Sodium: 135 mEq/L — ABNORMAL LOW (ref 137–147)
Total Bilirubin: 0.5 mg/dL (ref 0.3–1.2)
Total Protein: 6.5 g/dL (ref 6.0–8.3)

## 2013-10-16 LAB — GLUCOSE, CAPILLARY
Glucose-Capillary: 182 mg/dL — ABNORMAL HIGH (ref 70–99)
Glucose-Capillary: 168 mg/dL — ABNORMAL HIGH (ref 70–99)

## 2013-10-16 LAB — POC OCCULT BLOOD, ED: Fecal Occult Bld: NEGATIVE

## 2013-10-16 MED ORDER — TACROLIMUS 1 MG PO CAPS: 1.0000 mg | ORAL_CAPSULE | Freq: Two times a day (BID) | ORAL | Status: DC

## 2013-10-16 MED ORDER — CARVEDILOL 25 MG PO TABS
25.0000 mg | ORAL_TABLET | Freq: Two times a day (BID) | ORAL | Status: DC
  Administered 2013-10-16 – 2013-10-19 (×6): 25 mg via ORAL
  Filled 2013-10-16 (×7): qty 1

## 2013-10-16 MED ORDER — MYCOPHENOLATE MOFETIL HCL 500 MG IV SOLR
1000.0000 mg | Freq: Two times a day (BID) | INTRAVENOUS | Status: DC
  Administered 2013-10-16 – 2013-10-17 (×3): 1000 mg via INTRAVENOUS
  Filled 2013-10-16 (×8): qty 30

## 2013-10-16 MED ORDER — ONDANSETRON 4 MG PO TBDP
4.0000 mg | ORAL_TABLET | Freq: Once | ORAL | Status: AC
  Administered 2013-10-16: 4 mg via ORAL
  Filled 2013-10-16: qty 1

## 2013-10-16 MED ORDER — FENTANYL CITRATE 0.05 MG/ML IJ SOLN
50.0000 ug | Freq: Once | INTRAMUSCULAR | Status: AC
  Administered 2013-10-16: 50 ug via INTRAVENOUS
  Filled 2013-10-16: qty 2

## 2013-10-16 MED ORDER — ONDANSETRON HCL 4 MG/2ML IJ SOLN: 4.0000 mg | Freq: Four times a day (QID) | INTRAMUSCULAR | Status: DC | PRN

## 2013-10-16 MED ORDER — TACROLIMUS 1 MG/ML ORAL SUSPENSION
1.0000 mg | Freq: Every evening | ORAL | Status: DC
  Filled 2013-10-16: qty 1

## 2013-10-16 MED ORDER — HYDRALAZINE HCL 20 MG/ML IJ SOLN
10.0000 mg | Freq: Four times a day (QID) | INTRAMUSCULAR | Status: DC | PRN
  Administered 2013-10-17: 10 mg via INTRAVENOUS
  Filled 2013-10-16: qty 1

## 2013-10-16 MED ORDER — SODIUM CHLORIDE 0.9 % IV SOLN: INTRAVENOUS | Status: DC

## 2013-10-16 MED ORDER — SODIUM CHLORIDE 0.9 % IV BOLUS (SEPSIS)
500.0000 mL | Freq: Once | INTRAVENOUS | Status: AC
  Administered 2013-10-16: 15:00:00 via INTRAVENOUS

## 2013-10-16 MED ORDER — TACROLIMUS 1 MG PO CAPS
1.0000 mg | ORAL_CAPSULE | Freq: Every evening | ORAL | Status: DC
  Administered 2013-10-16 – 2013-10-18 (×3): 1 mg via ORAL
  Filled 2013-10-16 (×4): qty 1

## 2013-10-16 MED ORDER — SODIUM CHLORIDE 0.9 % IV SOLN
INTRAVENOUS | Status: DC
  Administered 2013-10-17 – 2013-10-19 (×3): via INTRAVENOUS

## 2013-10-16 MED ORDER — LIDOCAINE HCL (PF) 1 % IJ SOLN
2.0000 mL | Freq: Once | INTRAMUSCULAR | Status: AC
  Administered 2013-10-16: 2 mL

## 2013-10-16 MED ORDER — ONDANSETRON HCL 4 MG/2ML IJ SOLN
4.0000 mg | Freq: Once | INTRAMUSCULAR | Status: AC
  Administered 2013-10-16: 4 mg via INTRAVENOUS
  Filled 2013-10-16: qty 2

## 2013-10-16 MED ORDER — ACETAMINOPHEN 650 MG RE SUPP: 650.0000 mg | Freq: Four times a day (QID) | RECTAL | Status: DC | PRN

## 2013-10-16 MED ORDER — MORPHINE SULFATE 2 MG/ML IJ SOLN
1.0000 mg | INTRAMUSCULAR | Status: DC | PRN
  Administered 2013-10-17 (×4): 1 mg via INTRAVENOUS
  Filled 2013-10-16 (×4): qty 1

## 2013-10-16 MED ORDER — HEPARIN SODIUM (PORCINE) 5000 UNIT/ML IJ SOLN
5000.0000 [IU] | Freq: Three times a day (TID) | INTRAMUSCULAR | Status: DC
Start: 2013-10-16 — End: 2013-10-19
  Administered 2013-10-16 – 2013-10-19 (×8): 5000 [IU] via SUBCUTANEOUS
  Filled 2013-10-16 (×11): qty 1

## 2013-10-16 MED ORDER — BIOTENE DRY MOUTH MT LIQD
15.0000 mL | Freq: Two times a day (BID) | OROMUCOSAL | Status: DC
Start: 2013-10-17 — End: 2013-10-19
  Administered 2013-10-17 – 2013-10-19 (×3): 15 mL via OROMUCOSAL

## 2013-10-16 MED ORDER — SODIUM CHLORIDE 0.9 % IV BOLUS (SEPSIS)
500.0000 mL | Freq: Once | INTRAVENOUS | Status: AC
  Administered 2013-10-16: 500 mL via INTRAVENOUS

## 2013-10-16 MED ORDER — ALBUTEROL SULFATE (2.5 MG/3ML) 0.083% IN NEBU: 2.5000 mg | INHALATION_SOLUTION | RESPIRATORY_TRACT | Status: DC | PRN

## 2013-10-16 MED ORDER — MINERAL OIL RE ENEM
1.0000 | ENEMA | Freq: Once | RECTAL | Status: DC
  Filled 2013-10-16: qty 1

## 2013-10-16 MED ORDER — SODIUM CHLORIDE 0.9 % IJ SOLN
3.0000 mL | Freq: Two times a day (BID) | INTRAMUSCULAR | Status: DC
  Administered 2013-10-17 – 2013-10-18 (×2): 3 mL via INTRAVENOUS

## 2013-10-16 MED ORDER — IOHEXOL 300 MG/ML  SOLN: 25.0000 mL | INTRAMUSCULAR | Status: DC

## 2013-10-16 MED ORDER — INSULIN ASPART 100 UNIT/ML ~~LOC~~ SOLN
0.0000 [IU] | SUBCUTANEOUS | Status: DC
  Administered 2013-10-16 – 2013-10-17 (×2): 2 [IU] via SUBCUTANEOUS
  Administered 2013-10-17 (×2): 1 [IU] via SUBCUTANEOUS
  Administered 2013-10-17 – 2013-10-18 (×3): 2 [IU] via SUBCUTANEOUS
  Administered 2013-10-18: 1 [IU] via SUBCUTANEOUS

## 2013-10-16 MED ORDER — SODIUM CHLORIDE 0.9 % IV BOLUS (SEPSIS)
250.0000 mL | Freq: Once | INTRAVENOUS | Status: AC
  Administered 2013-10-16: 250 mL via INTRAVENOUS

## 2013-10-16 MED ORDER — CHLORHEXIDINE GLUCONATE 0.12 % MT SOLN
15.0000 mL | Freq: Two times a day (BID) | OROMUCOSAL | Status: DC
  Administered 2013-10-17 – 2013-10-19 (×5): 15 mL via OROMUCOSAL
  Filled 2013-10-16 (×8): qty 15

## 2013-10-16 MED ORDER — TACROLIMUS 1 MG PO CAPS
2.0000 mg | ORAL_CAPSULE | Freq: Every day | ORAL | Status: DC
  Administered 2013-10-17 – 2013-10-19 (×3): 2 mg via ORAL
  Filled 2013-10-16 (×4): qty 2

## 2013-10-16 MED ORDER — TACROLIMUS 1 MG/ML ORAL SUSPENSION
2.0000 mg | Freq: Every day | ORAL | Status: DC
  Filled 2013-10-16: qty 2

## 2013-10-16 MED ORDER — LIDOCAINE HCL (PF) 1 % IJ SOLN
INTRAMUSCULAR | Status: AC
Start: 2013-10-16 — End: 2013-10-17
  Filled 2013-10-16: qty 5

## 2013-10-16 MED ORDER — ONDANSETRON HCL 4 MG PO TABS
4.0000 mg | ORAL_TABLET | Freq: Four times a day (QID) | ORAL | Status: DC | PRN
  Filled 2013-10-16: qty 1

## 2013-10-16 MED ORDER — ACETAMINOPHEN 325 MG PO TABS: 650.0000 mg | ORAL_TABLET | Freq: Four times a day (QID) | ORAL | Status: DC | PRN

## 2013-10-16 MED ORDER — AMIODARONE HCL 200 MG PO TABS
200.0000 mg | ORAL_TABLET | Freq: Every day | ORAL | Status: DC
  Administered 2013-10-17 – 2013-10-19 (×3): 200 mg via ORAL
  Filled 2013-10-16 (×3): qty 1

## 2013-10-16 MED ORDER — LIDOCAINE HCL 2 % EX GEL
Freq: Once | CUTANEOUS | Status: AC
  Administered 2013-10-16: 12:00:00
  Filled 2013-10-16: qty 20

## 2013-10-16 MED ORDER — MYCOPHENOLATE MOFETIL 200 MG/ML PO SUSR: 1000.0000 mg | Freq: Two times a day (BID) | ORAL | Status: DC

## 2013-10-16 NOTE — ED Notes (Signed)
Spoke with CT, pt to be transported to CT now
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
## 2013-10-16 NOTE — ED Notes (Signed)
Pt is actively vomiting. RN notified

## 2013-10-16 NOTE — ED Notes (Signed)
pts family given graham crackers

## 2013-10-16 NOTE — ED Notes (Signed)
Pt reports relief post emesis, pt denies current nausea, PA at bedside discussing plan of care

## 2013-10-16 NOTE — ED Notes (Signed)
Per EMS: pt has been having abdominal pain for 5 hours. Pt has not had a bowel movement in 2-3 days. Pt also had a kidney transplant in Jan 2015. Pt VS and place on 2L of O2 for comfort due to feeling some SOB.

## 2013-10-16 NOTE — ED Notes (Signed)
IV team unable to gain IV access, Ray, Md at bedside attempting EJ access with [place]

## 2013-10-16 NOTE — ED Notes (Signed)
IV team paged for IV access for CT

## 2013-10-16 NOTE — ED Notes (Signed)
Pt returned from xray

## 2013-10-16 NOTE — ED Notes (Signed)
IV team paged & aware for need for IV Access

## 2013-10-16 NOTE — ED Provider Notes (Signed)
58 y.o. Male s/p renal transplant January 2015.  Presents today complaining of diffuse abdominal pain began last night now with vomiting here.  CT reveals sbo. Patient with creatinine increased to 3 from 1.5 last Monday.  Discussed with Dr. Roxy Horseman on [facility] renal transplant and advised to ask patient preference.  Patient and wife live in [place] and do not have a car, so would prefer admission here.  Dr. Roxy Horseman states he would change myfortic to cellcept 1 g bid while ng in and can clamp ng to give prograf.  They are available for consultation or transfer if needed.  Discussed with Modena Jansky, PA on for general surgery and asks that hospitalist admit.   Patient with 600 cc out of ng and feels improved at this time.   I performed a history and physical examination of [place] and discussed his management with Ms. Forucci.  I agree with the history, physical, assessment, and plan of care, with the following exceptions: None  I was present for the following procedures: None Time Spent in Critical Care of the patient: None Time spent in discussions with the patient and family: [place], MD 10/16/13 [phone]

## 2013-10-16 NOTE — Consult Note (Signed)
Pt seen and chart reviewed. ? sbo but no transition point.  Full of stool.  No peritonitis.  WBC normal.  NGT and follow for now.
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
## 2013-10-16 NOTE — ED Notes (Signed)
Pt states that he has been taking percoet (1 pill a day for knee pain.) pt states he has not had a bowel movement for 2-3 days. Pt abdomen distended and hard.
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
## 2013-10-16 NOTE — ED Notes (Signed)
pts family given a coke and graham crackers

## 2013-10-16 NOTE — ED Notes (Signed)
IV team at bedside attempting IV access
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
## 2013-10-16 NOTE — ED Notes (Signed)
Per PA hold enema until CT abd results

## 2013-10-17 ENCOUNTER — Inpatient Hospital Stay (HOSPITAL_COMMUNITY): Payer: Medicare Other

## 2013-10-17 DIAGNOSIS — Z940 Kidney transplant status: Secondary | ICD-10-CM

## 2013-10-17 DIAGNOSIS — M7989 Other specified soft tissue disorders: Secondary | ICD-10-CM

## 2013-10-17 LAB — CBC
HCT: 38.3 % — ABNORMAL LOW (ref 39.0–52.0)
Hemoglobin: 11.8 g/dL — ABNORMAL LOW (ref 13.0–17.0)
MCH: 25.8 pg — ABNORMAL LOW (ref 26.0–34.0)
MCHC: 30.8 g/dL (ref 30.0–36.0)
MCV: 83.6 fL (ref 78.0–100.0)
Platelets: 209 10*3/uL (ref 150–400)
RBC: 4.58 MIL/uL (ref 4.22–5.81)
RDW: 14.5 % (ref 11.5–15.5)
WBC: 2.9 10*3/uL — ABNORMAL LOW (ref 4.0–10.5)

## 2013-10-17 LAB — GLUCOSE, CAPILLARY
Glucose-Capillary: 130 mg/dL — ABNORMAL HIGH (ref 70–99)
Glucose-Capillary: 137 mg/dL — ABNORMAL HIGH (ref 70–99)
Glucose-Capillary: 147 mg/dL — ABNORMAL HIGH (ref 70–99)
Glucose-Capillary: 149 mg/dL — ABNORMAL HIGH (ref 70–99)
Glucose-Capillary: 168 mg/dL — ABNORMAL HIGH (ref 70–99)
Glucose-Capillary: 200 mg/dL — ABNORMAL HIGH (ref 70–99)

## 2013-10-17 LAB — COMPREHENSIVE METABOLIC PANEL
ALT: 26 U/L (ref 0–53)
AST: 28 U/L (ref 0–37)
Albumin: 3.3 g/dL — ABNORMAL LOW (ref 3.5–5.2)
Alkaline Phosphatase: 92 U/L (ref 39–117)
BUN: 18 mg/dL (ref 6–23)
CO2: 22 mEq/L (ref 19–32)
Calcium: 9.6 mg/dL (ref 8.4–10.5)
Chloride: 101 mEq/L (ref 96–112)
Creatinine, Ser: 1.52 mg/dL — ABNORMAL HIGH (ref 0.50–1.35)
GFR calc Af Amer: 57 mL/min — ABNORMAL LOW (ref >90)
GFR calc non Af Amer: 49 mL/min — ABNORMAL LOW (ref >90)
Glucose, Bld: 147 mg/dL — ABNORMAL HIGH (ref 70–99)
Potassium: 4 mEq/L (ref 3.7–5.3)
Sodium: 139 mEq/L (ref 137–147)
Total Bilirubin: 0.7 mg/dL (ref 0.3–1.2)
Total Protein: 6.6 g/dL (ref 6.0–8.3)

## 2013-10-17 LAB — HEMOGLOBIN A1C
Hgb A1c MFr Bld: 9.3 % — ABNORMAL HIGH (ref <5.7)
Mean Plasma Glucose: 220 mg/dL — ABNORMAL HIGH (ref <117)

## 2013-10-17 NOTE — Progress Notes (Addendum)
[facility] PROGRESS NOTE  NA TROWER [id number] DOB: [birth year] DOA: 10/16/2013 PCP: Philis Fendt, MD  Assessment/Plan: Principal Problem:   SBO (small bowel obstruction): X-ray this morning shows no improvement, however since then, patient has had a bowel movement plus gas. Continue NG tube for now. Reassessment in the morning Active Problems:   DM (diabetes mellitus), type 2, uncontrolled: Sliding scale only.    HTN (hypertension): Stable. Monitor blood pressures    History of renal transplant: Stable. Appreciate nephrology's help in discussion with transplant doctors for medications.  Repeat EKG in the morning monitoring QT intervals given adjustments in transplant medications    Acute on chronic renal failure: Repeat labs in the morning   Atrial fibrillation: On subcutaneous heparin     Code Status: Full code  Family Communication: Wife at the bedside.  Disposition Plan: Continue in hospital. Hopefully discontinuation of NG tube tomorrow   Consultants:  Nephrology  General surgery  Procedures:  None  Antibiotics:  None  HPI/Subjective: Patient doing okay. No bowel pain. Tolerating NG tube  Objective: Filed Vitals:   10/17/13 1005  BP: 156/78  Pulse: 84  Temp: 98.2 F (36.8 C)  Resp: 16    Intake/Output Summary (Last 24 hours) at 10/17/13 1416 Last data filed at 10/17/13 0700  Gross per 24 hour  Intake 1213.75 ml  Output   1750 ml  Net -536.25 ml   Filed Weights   10/16/13 0540 10/16/13 2117  Weight: 79.379 kg (175 lb) 84.5 kg (186 lb 4.6 oz)    Exam:   General:  Alert and oriented x3, no acute distress  Cardiovascular: Regular rate and rhythm, S1-S2  Respiratory: Clear to auscultation bilaterally  Abdomen: Soft, nondistended, few bowel sounds, nontender  Musculoskeletal: No clubbing or cyanosis or edema   Data Reviewed: Basic Metabolic Panel:  Recent Labs Lab 10/16/13 0758 10/16/13 0815  NA 135* 133*  K 4.2 3.8   CL 95* 101  CO2 27  --   GLUCOSE 75 73  BUN 33* 29*  CREATININE 2.71* 3.00*  CALCIUM 9.7  --    Liver Function Tests:  Recent Labs Lab 10/16/13 0758  AST 36  ALT 32  ALKPHOS 88  BILITOT 0.5  PROT 6.5  ALBUMIN 3.5   No results found for this basename: LIPASE, AMYLASE,  in the last 168 hours No results found for this basename: AMMONIA,  in the last 168 hours CBC:  Recent Labs Lab 10/16/13 0758 10/16/13 0815  WBC 5.3  --   NEUTROABS 4.2  --   HGB 11.5* 13.3  HCT 37.3* 39.0  MCV 83.8  --   PLT 181  --    Cardiac Enzymes: No results found for this basename: CKTOTAL, CKMB, CKMBINDEX, TROPONINI,  in the last 168 hours BNP (last 3 results) No results found for this basename: PROBNP,  in the last 8760 hours CBG:  Recent Labs Lab 10/16/13 2014 10/17/13 0016 10/17/13 0425 10/17/13 0741 10/17/13 1232  GLUCAP 168* 200* 168* 137* 147*    No results found for this or any previous visit (from the past 240 hour(s)).   Studies: Ct Abdomen Pelvis Wo Contrast  10/16/2013   CLINICAL DATA:  Abdominal pain.  EXAM: CT ABDOMEN AND PELVIS WITHOUT CONTRAST  TECHNIQUE: Multidetector CT imaging of the abdomen and pelvis was performed following the standard protocol without IV contrast.  COMPARISON:  Radiographs 10/16/2013.  FINDINGS: Bones: No aggressive osseous lesions are present. Lumbar spondylosis with calcified disc protrusions at L3-L4 and  L4-L5. Thoracic spine DISH. Left-greater-than-right sacroiliac joint degenerative disease.  Lung Bases: Dependent atelectasis.  Liver: Unenhanced CT was performed per clinician order. Lack of IV contrast limits sensitivity and specificity, especially for evaluation of abdominal/pelvic solid viscera. Nonspecific low-density lesion is present in the right hepatic lobe measuring 1 cm (image 20 series 2). Statistically this likely represents a cyst.  Spleen: Within normal limits. Small 1 cm low-density lesion over the superior spleen probably represents  cyst or hemangioma.  Gallbladder: No calcified stones. No inflammatory changes by noncontrast CT.  Common bile duct:  Normal.  Pancreas:  Normal.  Adrenal glands:  Normal bilaterally.  Kidneys: Polycystic kidneys are present. There is no hydronephrosis. No discrete mass lesion is identified. The ureters are within normal limits. Right iliac fossa renal allograft is present, also without hydronephrosis. The allograft ureter appears within normal limits.  Stomach: Small hiatal hernia. Thickening of the distal thoracic esophagus near the hernia suggesting reflux.  Small bowel: Duodenum appears normal. There is progressive dilation of small bowel in the abdomen. Dilated bowel appears to involve the jejunum. The ileum is decompressed. Small bowel anastomosis is present in the left central abdomen which appears patent. The ileum appears completely decompressed. Evaluation of the point of obstruction is degraded by paucity of oral contrast.  Colon: Normal appendix. Large stool burden in the right colon. With a large amount of stool suggests relatively acute high-grade small bowel obstruction. Distal colon appears within normal limits.  Pelvic Genitourinary:  Urinary bladder normal.  No free fluid.  Vasculature: Atherosclerosis. No gross noncontrast vascular abnormality.  Body Wall: Stranding along the right flank may represent postsurgical scarring in this patient with a renal allograft.  IMPRESSION: 1. Mechanical small bowel obstruction appears high-grade if not complete based on decompressed ileum. The transition point is not identified. 2. Polycystic kidney disease with right iliac fossa renal allograft. No complicating features of the renal allograft. 3. Low-density lesions in the liver and spleen, probably representing cysts and/or hemangioma. 4. Small hiatal hernia with distal esophageal thickening suggesting reflux.   Electronically Signed   By: Dereck Ligas M.D.   On: 10/16/2013 11:46   Dg Chest Port 1  View  10/16/2013   CLINICAL DATA:  Status post central line placement  EXAM: PORTABLE CHEST - 1 VIEW  COMPARISON:  Film from earlier in the same day.  FINDINGS: Cardiac shadow is within normal limits. A nasogastric catheter is again seen coiled within the stomach. A right jugular central line is again seen with the catheter tip at the cavoatrial junction in satisfactory position. The lungs are clear. No pneumothorax is noted. No other focal abnormality is seen.  IMPRESSION: Jugular central line in satisfactory position. No acute abnormality noted.   Electronically Signed   By: Inez Catalina M.D.   On: 10/16/2013 17:47   Dg Chest Port 1 View  10/16/2013   CLINICAL DATA:  Central line placement, shortness of breath  EXAM: PORTABLE CHEST - 1 VIEW  COMPARISON:  01/02/2013  FINDINGS: Cardiomediastinal silhouette is stable. No acute infiltrate or pulmonary edema. NG tube in place with tip in proximal stomach. There is right IJ central line with tip in right atrium. For distal SVC position Central line should be retracted about 1.5 cm. No pneumothorax.  IMPRESSION: Right IJ line with tip in right atrium. For distal SVC position central line should be retracted about 1.5 cm. No pneumothorax.   Electronically Signed   By: Lahoma Crocker M.D.   On: 10/16/2013 15:16  Dg Abd 2 Views  10/17/2013   CLINICAL DATA:  Small bowel obstruction  EXAM: ABDOMEN - 2 VIEW  COMPARISON:  Yesterday  FINDINGS: NG tube is coiled in the stomach with its tip in the fundus. Dilated small bowel loops with air-fluid levels persist. Colonic gas is present. No obvious free intraperitoneal gas.  IMPRESSION: NG tube placed.  Persistent partial small bowel obstruction pattern.   Electronically Signed   By: Maryclare Bean M.D.   On: 10/17/2013 07:50   Dg Abd 2 Views  10/16/2013   CLINICAL DATA:  Abdominal pain and distention for 5 hr. Constipation.  EXAM: ABDOMEN - 2 VIEW  COMPARISON:  Barium enema 02/25/2011  FINDINGS: Gas and stool throughout the colon  with moderate dilatation suggesting ileus. Bowel staples in the left mid abdomen. Suggestion of small bowel dilatation and air-fluid levels. Small bowel obstruction is not excluded. Changes could also be related to constipation. No free intra-abdominal air. No radiopaque stones. Degenerative changes in the spine.  IMPRESSION: Gas and stool in the colon suggesting constipation and ileus. Distention of left abdominal small bowel with air-fluid levels may indicate obstruction.   Electronically Signed   By: Lucienne Capers M.D.   On: 10/16/2013 06:57    Scheduled Meds: . amiodarone  200 mg Oral Daily  . antiseptic oral rinse  15 mL Mouth Rinse q12n4p  . carvedilol  25 mg Oral BID  . chlorhexidine  15 mL Mouth Rinse BID  . heparin subcutaneous  5,000 Units Subcutaneous 3 times per day  . insulin aspart  0-9 Units Subcutaneous 6 times per day  . mycophenolate (CELLCEPT) IV  1,000 mg Intravenous Q12H  . sodium chloride  3 mL Intravenous Q12H  . tacrolimus  1 mg Oral QPM  . tacrolimus  2 mg Oral Q0600   Continuous Infusions: . sodium chloride 75 mL/hr at 10/16/13 2252    Principal Problem:   SBO (small bowel obstruction) Active Problems:   DM (diabetes mellitus), type 2, uncontrolled   HTN (hypertension)   ESRD on dialysis   History of renal transplant   Acute on chronic renal failure   Atrial fibrillation    Time spent: 15 minutes    [facility] Pager [phone]. If 7PM-7AM, please contact night-coverage at [email or web address], password [facility] 10/17/2013, 2:16 PM  LOS: 1 day

## 2013-10-17 NOTE — Progress Notes (Signed)
.  VASCULAR LAB PRELIMINARY  PRELIMINARY  PRELIMINARY  PRELIMINARY  Bilateral lower extremity venous duplex completed.    Preliminary report:  Bilateral:  No evidence of DVT, superficial thrombosis, or Baker's Cyst.   Syed Zukas, RVS 10/17/2013, 11:31 AM

## 2013-10-17 NOTE — Progress Notes (Signed)
Hopefully avoid surgery.  Continue medical management

## 2013-10-17 NOTE — Progress Notes (Signed)
Pt back from venous doppler.

## 2013-10-17 NOTE — Progress Notes (Signed)
Impression/Plan  1. Renal transplant (05/2013) with AKI on CKD .  2.   SBO  Subjective: Interval History: He has had 2 bowel movements today  Objective: Vital signs in last 24 hours: Temp:  [98.1 F (36.7 C)-99 F (37.2 C)] 98.2 F (36.8 C) (06/10 1005) Pulse Rate:  [77-84] 84 (06/10 1005) Resp:  [15-16] 16 (06/10 1005) BP: (133-156)/(67-78) 156/78 mmHg (06/10 1005) SpO2:  [96 %-100 %] 98 % (06/10 1005) Weight:  [84.5 kg (186 lb 4.6 oz)] 84.5 kg (186 lb 4.6 oz) (06/09 2117) Weight change: 5.121 kg (11 lb 4.6 oz)  Intake/Output from previous day: 06/09 0701 - 06/10 0700 In: 1213.8 [I.V.:713.8; IV Piggyback:500] Out: 3500 [Urine:475; Emesis/NG output:1875] Intake/Output this shift:    General appearance: alert and cooperative Nose: Nares normal. Septum midline. Mucosa normal. No drainage or sinus tenderness., NG tube GI: soft, non-tender; bowel sounds normal; no masses,  no organomegaly  Lab Results:  Recent Labs  10/16/13 0758 10/16/13 0815 10/17/13 1350  WBC 5.3  --  2.9*  HGB 11.5* 13.3 11.8*  HCT 37.3* 39.0 38.3*  PLT 181  --  209   BMET:  Recent Labs  10/16/13 0758 10/16/13 0815  NA 135* 133*  K 4.2 3.8  CL 95* 101  CO2 27  --   GLUCOSE 75 73  BUN 33* 29*  CREATININE 2.71* 3.00*  CALCIUM 9.7  --    No results found for this basename: PTH,  in the last 72 hours Iron Studies: No results found for this basename: IRON, TIBC, TRANSFERRIN, FERRITIN,  in the last 72 hours Studies/Results: Ct Abdomen Pelvis Wo Contrast  10/16/2013   CLINICAL DATA:  Abdominal pain.  EXAM: CT ABDOMEN AND PELVIS WITHOUT CONTRAST  TECHNIQUE: Multidetector CT imaging of the abdomen and pelvis was performed following the standard protocol without IV contrast.  COMPARISON:  Radiographs 10/16/2013.  FINDINGS: Bones: No aggressive osseous lesions are present. Lumbar spondylosis with calcified disc protrusions at L3-L4 and L4-L5. Thoracic spine DISH. Left-greater-than-right sacroiliac  joint degenerative disease.  Lung Bases: Dependent atelectasis.  Liver: Unenhanced CT was performed per clinician order. Lack of IV contrast limits sensitivity and specificity, especially for evaluation of abdominal/pelvic solid viscera. Nonspecific low-density lesion is present in the right hepatic lobe measuring 1 cm (image 20 series 2). Statistically this likely represents a cyst.  Spleen: Within normal limits. Small 1 cm low-density lesion over the superior spleen probably represents cyst or hemangioma.  Gallbladder: No calcified stones. No inflammatory changes by noncontrast CT.  Common bile duct:  Normal.  Pancreas:  Normal.  Adrenal glands:  Normal bilaterally.  Kidneys: Polycystic kidneys are present. There is no hydronephrosis. No discrete mass lesion is identified. The ureters are within normal limits. Right iliac fossa renal allograft is present, also without hydronephrosis. The allograft ureter appears within normal limits.  Stomach: Small hiatal hernia. Thickening of the distal thoracic esophagus near the hernia suggesting reflux.  Small bowel: Duodenum appears normal. There is progressive dilation of small bowel in the abdomen. Dilated bowel appears to involve the jejunum. The ileum is decompressed. Small bowel anastomosis is present in the left central abdomen which appears patent. The ileum appears completely decompressed. Evaluation of the point of obstruction is degraded by paucity of oral contrast.  Colon: Normal appendix. Large stool burden in the right colon. With a large amount of stool suggests relatively acute high-grade small bowel obstruction. Distal colon appears within normal limits.  Pelvic Genitourinary:  Urinary bladder normal.  No  free fluid.  Vasculature: Atherosclerosis. No gross noncontrast vascular abnormality.  Body Wall: Stranding along the right flank may represent postsurgical scarring in this patient with a renal allograft.  IMPRESSION: 1. Mechanical small bowel obstruction  appears high-grade if not complete based on decompressed ileum. The transition point is not identified. 2. Polycystic kidney disease with right iliac fossa renal allograft. No complicating features of the renal allograft. 3. Low-density lesions in the liver and spleen, probably representing cysts and/or hemangioma. 4. Small hiatal hernia with distal esophageal thickening suggesting reflux.   Electronically Signed   By: Dereck Ligas M.D.   On: 10/16/2013 11:46   Dg Chest Port 1 View  10/16/2013   CLINICAL DATA:  Status post central line placement  EXAM: PORTABLE CHEST - 1 VIEW  COMPARISON:  Film from earlier in the same day.  FINDINGS: Cardiac shadow is within normal limits. A nasogastric catheter is again seen coiled within the stomach. A right jugular central line is again seen with the catheter tip at the cavoatrial junction in satisfactory position. The lungs are clear. No pneumothorax is noted. No other focal abnormality is seen.  IMPRESSION: Jugular central line in satisfactory position. No acute abnormality noted.   Electronically Signed   By: Inez Catalina M.D.   On: 10/16/2013 17:47   Dg Chest Port 1 View  10/16/2013   CLINICAL DATA:  Central line placement, shortness of breath  EXAM: PORTABLE CHEST - 1 VIEW  COMPARISON:  01/02/2013  FINDINGS: Cardiomediastinal silhouette is stable. No acute infiltrate or pulmonary edema. NG tube in place with tip in proximal stomach. There is right IJ central line with tip in right atrium. For distal SVC position Central line should be retracted about 1.5 cm. No pneumothorax.  IMPRESSION: Right IJ line with tip in right atrium. For distal SVC position central line should be retracted about 1.5 cm. No pneumothorax.   Electronically Signed   By: Lahoma Crocker M.D.   On: 10/16/2013 15:16   Dg Abd 2 Views  10/17/2013   CLINICAL DATA:  Small bowel obstruction  EXAM: ABDOMEN - 2 VIEW  COMPARISON:  Yesterday  FINDINGS: NG tube is coiled in the stomach with its tip in the  fundus. Dilated small bowel loops with air-fluid levels persist. Colonic gas is present. No obvious free intraperitoneal gas.  IMPRESSION: NG tube placed.  Persistent partial small bowel obstruction pattern.   Electronically Signed   By: Maryclare Bean M.D.   On: 10/17/2013 07:50   Dg Abd 2 Views  10/16/2013   CLINICAL DATA:  Abdominal pain and distention for 5 hr. Constipation.  EXAM: ABDOMEN - 2 VIEW  COMPARISON:  Barium enema 02/25/2011  FINDINGS: Gas and stool throughout the colon with moderate dilatation suggesting ileus. Bowel staples in the left mid abdomen. Suggestion of small bowel dilatation and air-fluid levels. Small bowel obstruction is not excluded. Changes could also be related to constipation. No free intra-abdominal air. No radiopaque stones. Degenerative changes in the spine.  IMPRESSION: Gas and stool in the colon suggesting constipation and ileus. Distention of left abdominal small bowel with air-fluid levels may indicate obstruction.   Electronically Signed   By: Lucienne Capers M.D.   On: 10/16/2013 06:57    Scheduled: . amiodarone  200 mg Oral Daily  . antiseptic oral rinse  15 mL Mouth Rinse q12n4p  . carvedilol  25 mg Oral BID  . chlorhexidine  15 mL Mouth Rinse BID  . heparin subcutaneous  5,000 Units Subcutaneous  3 times per day  . insulin aspart  0-9 Units Subcutaneous 6 times per day  . mycophenolate (CELLCEPT) IV  1,000 mg Intravenous Q12H  . sodium chloride  3 mL Intravenous Q12H  . tacrolimus  1 mg Oral QPM  . tacrolimus  2 mg Oral Q0600     LOS: 1 day   Malia Corsi C 10/17/2013,3:54 PM

## 2013-10-17 NOTE — Progress Notes (Signed)
Subjective: Since the film this AM he has had a BM, and is passing gas.  Feels better. NG is in to far.  Objective: Vital signs in last 24 hours: Temp:  [98.1 F (36.7 C)-99 F (37.2 C)] 98.2 F (36.8 C) (06/10 1005) Pulse Rate:  [70-84] 84 (06/10 1005) Resp:  [13-20] 16 (06/10 1005) BP: (132-159)/(67-90) 156/78 mmHg (06/10 1005) SpO2:  [96 %-100 %] 98 % (06/10 1005) Weight:  [84.5 kg (186 lb 4.6 oz)] 84.5 kg (186 lb 4.6 oz) (06/09 2117) Last BM Date: 10/13/13 1875 from NG recorded yesterday NPO Afebrile, VSS No labs Film:  NG ok, persistent SBO Intake/Output from previous day: 06/09 0701 - 06/10 0700 In: 1213.8 [I.V.:713.8; IV Piggyback:500] Out: 3500 [Urine:475; Emesis/NG [id number] Intake/Output this shift:    General appearance: alert, cooperative and no distress GI: soft, not really tender, + BS, 1 BM.  Lab Results:   Recent Labs  10/16/13 0758 10/16/13 0815  WBC 5.3  --   HGB 11.5* 13.3  HCT 37.3* 39.0  PLT 181  --     BMET  Recent Labs  10/16/13 0758 10/16/13 0815  NA 135* 133*  K 4.2 3.8  CL 95* 101  CO2 27  --   GLUCOSE 75 73  BUN 33* 29*  CREATININE 2.71* 3.00*  CALCIUM 9.7  --    PT/INR No results found for this basename: LABPROT, INR,  in the last 72 hours   Recent Labs Lab 10/16/13 0758  AST 36  ALT 32  ALKPHOS 88  BILITOT 0.5  PROT 6.5  ALBUMIN 3.5     Lipase  No results found for this basename: lipase     Studies/Results: Ct Abdomen Pelvis Wo Contrast  10/16/2013   CLINICAL DATA:  Abdominal pain.  EXAM: CT ABDOMEN AND PELVIS WITHOUT CONTRAST  TECHNIQUE: Multidetector CT imaging of the abdomen and pelvis was performed following the standard protocol without IV contrast.  COMPARISON:  Radiographs 10/16/2013.  FINDINGS: Bones: No aggressive osseous lesions are present. Lumbar spondylosis with calcified disc protrusions at L3-L4 and L4-L5. Thoracic spine DISH. Left-greater-than-right sacroiliac joint degenerative disease.   Lung Bases: Dependent atelectasis.  Liver: Unenhanced CT was performed per clinician order. Lack of IV contrast limits sensitivity and specificity, especially for evaluation of abdominal/pelvic solid viscera. Nonspecific low-density lesion is present in the right hepatic lobe measuring 1 cm (image 20 series 2). Statistically this likely represents a cyst.  Spleen: Within normal limits. Small 1 cm low-density lesion over the superior spleen probably represents cyst or hemangioma.  Gallbladder: No calcified stones. No inflammatory changes by noncontrast CT.  Common bile duct:  Normal.  Pancreas:  Normal.  Adrenal glands:  Normal bilaterally.  Kidneys: Polycystic kidneys are present. There is no hydronephrosis. No discrete mass lesion is identified. The ureters are within normal limits. Right iliac fossa renal allograft is present, also without hydronephrosis. The allograft ureter appears within normal limits.  Stomach: Small hiatal hernia. Thickening of the distal thoracic esophagus near the hernia suggesting reflux.  Small bowel: Duodenum appears normal. There is progressive dilation of small bowel in the abdomen. Dilated bowel appears to involve the jejunum. The ileum is decompressed. Small bowel anastomosis is present in the left central abdomen which appears patent. The ileum appears completely decompressed. Evaluation of the point of obstruction is degraded by paucity of oral contrast.  Colon: Normal appendix. Large stool burden in the right colon. With a large amount of stool suggests relatively acute high-grade small bowel obstruction.  Distal colon appears within normal limits.  Pelvic Genitourinary:  Urinary bladder normal.  No free fluid.  Vasculature: Atherosclerosis. No gross noncontrast vascular abnormality.  Body Wall: Stranding along the right flank may represent postsurgical scarring in this patient with a renal allograft.  IMPRESSION: 1. Mechanical small bowel obstruction appears high-grade if not  complete based on decompressed ileum. The transition point is not identified. 2. Polycystic kidney disease with right iliac fossa renal allograft. No complicating features of the renal allograft. 3. Low-density lesions in the liver and spleen, probably representing cysts and/or hemangioma. 4. Small hiatal hernia with distal esophageal thickening suggesting reflux.   Electronically Signed   By: Dereck Ligas M.D.   On: 10/16/2013 11:46   Dg Chest Port 1 View  10/16/2013   CLINICAL DATA:  Status post central line placement  EXAM: PORTABLE CHEST - 1 VIEW  COMPARISON:  Film from earlier in the same day.  FINDINGS: Cardiac shadow is within normal limits. A nasogastric catheter is again seen coiled within the stomach. A right jugular central line is again seen with the catheter tip at the cavoatrial junction in satisfactory position. The lungs are clear. No pneumothorax is noted. No other focal abnormality is seen.  IMPRESSION: Jugular central line in satisfactory position. No acute abnormality noted.   Electronically Signed   By: Inez Catalina M.D.   On: 10/16/2013 17:47   Dg Chest Port 1 View  10/16/2013   CLINICAL DATA:  Central line placement, shortness of breath  EXAM: PORTABLE CHEST - 1 VIEW  COMPARISON:  01/02/2013  FINDINGS: Cardiomediastinal silhouette is stable. No acute infiltrate or pulmonary edema. NG tube in place with tip in proximal stomach. There is right IJ central line with tip in right atrium. For distal SVC position Central line should be retracted about 1.5 cm. No pneumothorax.  IMPRESSION: Right IJ line with tip in right atrium. For distal SVC position central line should be retracted about 1.5 cm. No pneumothorax.   Electronically Signed   By: Lahoma Crocker M.D.   On: 10/16/2013 15:16   Dg Abd 2 Views  10/17/2013   CLINICAL DATA:  Small bowel obstruction  EXAM: ABDOMEN - 2 VIEW  COMPARISON:  Yesterday  FINDINGS: NG tube is coiled in the stomach with its tip in the fundus. Dilated small bowel  loops with air-fluid levels persist. Colonic gas is present. No obvious free intraperitoneal gas.  IMPRESSION: NG tube placed.  Persistent partial small bowel obstruction pattern.   Electronically Signed   By: Maryclare Bean M.D.   On: 10/17/2013 07:50   Dg Abd 2 Views  10/16/2013   CLINICAL DATA:  Abdominal pain and distention for 5 hr. Constipation.  EXAM: ABDOMEN - 2 VIEW  COMPARISON:  Barium enema 02/25/2011  FINDINGS: Gas and stool throughout the colon with moderate dilatation suggesting ileus. Bowel staples in the left mid abdomen. Suggestion of small bowel dilatation and air-fluid levels. Small bowel obstruction is not excluded. Changes could also be related to constipation. No free intra-abdominal air. No radiopaque stones. Degenerative changes in the spine.  IMPRESSION: Gas and stool in the colon suggesting constipation and ileus. Distention of left abdominal small bowel with air-fluid levels may indicate obstruction.   Electronically Signed   By: Lucienne Capers M.D.   On: 10/16/2013 06:57    Medications: . amiodarone  200 mg Oral Daily  . antiseptic oral rinse  15 mL Mouth Rinse q12n4p  . carvedilol  25 mg Oral BID  .  chlorhexidine  15 mL Mouth Rinse BID  . heparin subcutaneous  5,000 Units Subcutaneous 3 times per day  . insulin aspart  0-9 Units Subcutaneous 6 times per day  . mycophenolate (CELLCEPT) IV  1,000 mg Intravenous Q12H  . sodium chloride  3 mL Intravenous Q12H  . tacrolimus  1 mg Oral QPM  . tacrolimus  2 mg Oral Q0600    Assessment/Plan SBO with hx of bowel resection, colostomy and reversal 1988 Renal transplant [facility] - On Tacrolimus ESRD Atrial fibrillation on Eliquis for anticoagulation on hold AODM Insulin dependant Hypertension   Plan:  I am going to leave NG for now since he had so much drainage last PM.  We may try clamping later, if he continues to do well.  Ambulate in halls, we will continue to follow.  Hopefully this is resolving  quickly.    LOS: 1 day    Amaiyah Nordhoff 10/17/2013

## 2013-10-18 DIAGNOSIS — R9431 Abnormal electrocardiogram [ECG] [EKG]: Secondary | ICD-10-CM | POA: Diagnosis not present

## 2013-10-18 LAB — BASIC METABOLIC PANEL
BUN: 15 mg/dL (ref 6–23)
CO2: 24 mEq/L (ref 19–32)
Calcium: 9.7 mg/dL (ref 8.4–10.5)
Chloride: 104 mEq/L (ref 96–112)
Creatinine, Ser: 1.52 mg/dL — ABNORMAL HIGH (ref 0.50–1.35)
GFR calc Af Amer: 57 mL/min — ABNORMAL LOW (ref >90)
GFR calc non Af Amer: 49 mL/min — ABNORMAL LOW (ref >90)
Glucose, Bld: 112 mg/dL — ABNORMAL HIGH (ref 70–99)
Potassium: 3.7 mEq/L (ref 3.7–5.3)
Sodium: 142 mEq/L (ref 137–147)

## 2013-10-18 LAB — GLUCOSE, CAPILLARY
Glucose-Capillary: 104 mg/dL — ABNORMAL HIGH (ref 70–99)
Glucose-Capillary: 105 mg/dL — ABNORMAL HIGH (ref 70–99)
Glucose-Capillary: 132 mg/dL — ABNORMAL HIGH (ref 70–99)
Glucose-Capillary: 175 mg/dL — ABNORMAL HIGH (ref 70–99)
Glucose-Capillary: 185 mg/dL — ABNORMAL HIGH (ref 70–99)
Glucose-Capillary: 258 mg/dL — ABNORMAL HIGH (ref 70–99)

## 2013-10-18 MED ORDER — SODIUM CHLORIDE 0.9 % IJ SOLN
10.0000 mL | INTRAMUSCULAR | Status: DC | PRN
  Administered 2013-10-18 – 2013-10-19 (×4): 10 mL

## 2013-10-18 MED ORDER — INSULIN ASPART 100 UNIT/ML ~~LOC~~ SOLN
0.0000 [IU] | Freq: Three times a day (TID) | SUBCUTANEOUS | Status: DC
  Administered 2013-10-18: 2 [IU] via SUBCUTANEOUS
  Administered 2013-10-19: 3 [IU] via SUBCUTANEOUS

## 2013-10-18 MED ORDER — MYCOPHENOLATE SODIUM 180 MG PO TBEC
720.0000 mg | DELAYED_RELEASE_TABLET | Freq: Two times a day (BID) | ORAL | Status: DC
  Administered 2013-10-18 – 2013-10-19 (×2): 720 mg via ORAL
  Filled 2013-10-18 (×6): qty 4

## 2013-10-18 MED ORDER — SODIUM CHLORIDE 0.9 % IJ SOLN: 10.0000 mL | Freq: Two times a day (BID) | INTRAMUSCULAR | Status: DC

## 2013-10-18 MED ORDER — INSULIN ASPART 100 UNIT/ML ~~LOC~~ SOLN
0.0000 [IU] | Freq: Every day | SUBCUTANEOUS | Status: DC
  Administered 2013-10-18: 3 [IU] via SUBCUTANEOUS

## 2013-10-18 MED ORDER — CLONIDINE HCL 0.3 MG PO TABS
0.3000 mg | ORAL_TABLET | Freq: Three times a day (TID) | ORAL | Status: DC
  Administered 2013-10-18 – 2013-10-19 (×3): 0.3 mg via ORAL
  Filled 2013-10-18 (×5): qty 1

## 2013-10-18 MED ORDER — FUROSEMIDE 20 MG PO TABS
20.0000 mg | ORAL_TABLET | Freq: Every day | ORAL | Status: DC
  Administered 2013-10-18 – 2013-10-19 (×2): 20 mg via ORAL
  Filled 2013-10-18 (×2): qty 1

## 2013-10-18 MED ORDER — PREGABALIN 100 MG PO CAPS
100.0000 mg | ORAL_CAPSULE | Freq: Two times a day (BID) | ORAL | Status: DC
  Administered 2013-10-18 – 2013-10-19 (×3): 100 mg via ORAL
  Filled 2013-10-18 (×3): qty 1

## 2013-10-18 NOTE — Progress Notes (Signed)
Subjective: Clear green drainage from NG he feels much better, will clamp and start clears pull tube later today if he has no complaints.  Objective: Vital signs in last 24 hours: Temp:  [98.2 F (36.8 C)-98.6 F (37 C)] 98.5 F (36.9 C) (06/11 0524) Pulse Rate:  [70-84] 74 (06/11 0524) Resp:  [16-18] 18 (06/11 0524) BP: (149-187)/(78-91) 169/78 mmHg (06/11 0524) SpO2:  [96 %-98 %] 98 % (06/11 0524) Weight:  [79.9 kg (176 lb 2.4 oz)] 79.9 kg (176 lb 2.4 oz) (06/10 2114) Last BM Date: 10/17/13 Nothing po 1600 from NG recorded 4 BM recorded Afebrile BP up some BMP OK  Intake/Output from previous day: 06/10 0701 - 06/11 0700 In: 550 [I.V.:550] Out: 2954 [Urine:1350; Emesis/NG output:1600; Stool:4] Intake/Output this shift:    General appearance: alert, cooperative and no distress GI: soft, non-tender; bowel sounds normal; no masses,  no organomegaly  Lab Results:   Recent Labs  10/16/13 0758 10/16/13 0815 10/17/13 1350  WBC 5.3  --  2.9*  HGB 11.5* 13.3 11.8*  HCT 37.3* 39.0 38.3*  PLT 181  --  209    BMET  Recent Labs  10/17/13 1745 10/18/13 0509  NA 139 142  K 4.0 3.7  CL 101 104  CO2 22 24  GLUCOSE 147* 112*  BUN 18 15  CREATININE 1.52* 1.52*  CALCIUM 9.6 9.7   PT/INR No results found for this basename: LABPROT, INR,  in the last 72 hours   Recent Labs Lab 10/16/13 0758 10/17/13 1745  AST 36 28  ALT 32 26  ALKPHOS 88 92  BILITOT 0.5 0.7  PROT 6.5 6.6  ALBUMIN 3.5 3.3*     Lipase  No results found for this basename: lipase     Studies/Results: Ct Abdomen Pelvis Wo Contrast  10/16/2013   CLINICAL DATA:  Abdominal pain.  EXAM: CT ABDOMEN AND PELVIS WITHOUT CONTRAST  TECHNIQUE: Multidetector CT imaging of the abdomen and pelvis was performed following the standard protocol without IV contrast.  COMPARISON:  Radiographs 10/16/2013.  FINDINGS: Bones: No aggressive osseous lesions are present. Lumbar spondylosis with calcified disc  protrusions at L3-L4 and L4-L5. Thoracic spine DISH. Left-greater-than-right sacroiliac joint degenerative disease.  Lung Bases: Dependent atelectasis.  Liver: Unenhanced CT was performed per clinician order. Lack of IV contrast limits sensitivity and specificity, especially for evaluation of abdominal/pelvic solid viscera. Nonspecific low-density lesion is present in the right hepatic lobe measuring 1 cm (image 20 series 2). Statistically this likely represents a cyst.  Spleen: Within normal limits. Small 1 cm low-density lesion over the superior spleen probably represents cyst or hemangioma.  Gallbladder: No calcified stones. No inflammatory changes by noncontrast CT.  Common bile duct:  Normal.  Pancreas:  Normal.  Adrenal glands:  Normal bilaterally.  Kidneys: Polycystic kidneys are present. There is no hydronephrosis. No discrete mass lesion is identified. The ureters are within normal limits. Right iliac fossa renal allograft is present, also without hydronephrosis. The allograft ureter appears within normal limits.  Stomach: Small hiatal hernia. Thickening of the distal thoracic esophagus near the hernia suggesting reflux.  Small bowel: Duodenum appears normal. There is progressive dilation of small bowel in the abdomen. Dilated bowel appears to involve the jejunum. The ileum is decompressed. Small bowel anastomosis is present in the left central abdomen which appears patent. The ileum appears completely decompressed. Evaluation of the point of obstruction is degraded by paucity of oral contrast.  Colon: Normal appendix. Large stool burden in the right colon. With  a large amount of stool suggests relatively acute high-grade small bowel obstruction. Distal colon appears within normal limits.  Pelvic Genitourinary:  Urinary bladder normal.  No free fluid.  Vasculature: Atherosclerosis. No gross noncontrast vascular abnormality.  Body Wall: Stranding along the right flank may represent postsurgical scarring in  this patient with a renal allograft.  IMPRESSION: 1. Mechanical small bowel obstruction appears high-grade if not complete based on decompressed ileum. The transition point is not identified. 2. Polycystic kidney disease with right iliac fossa renal allograft. No complicating features of the renal allograft. 3. Low-density lesions in the liver and spleen, probably representing cysts and/or hemangioma. 4. Small hiatal hernia with distal esophageal thickening suggesting reflux.   Electronically Signed   By: Dereck Ligas M.D.   On: 10/16/2013 11:46   Dg Chest Port 1 View  10/16/2013   CLINICAL DATA:  Status post central line placement  EXAM: PORTABLE CHEST - 1 VIEW  COMPARISON:  Film from earlier in the same day.  FINDINGS: Cardiac shadow is within normal limits. A nasogastric catheter is again seen coiled within the stomach. A right jugular central line is again seen with the catheter tip at the cavoatrial junction in satisfactory position. The lungs are clear. No pneumothorax is noted. No other focal abnormality is seen.  IMPRESSION: Jugular central line in satisfactory position. No acute abnormality noted.   Electronically Signed   By: Inez Catalina M.D.   On: 10/16/2013 17:47   Dg Chest Port 1 View  10/16/2013   CLINICAL DATA:  Central line placement, shortness of breath  EXAM: PORTABLE CHEST - 1 VIEW  COMPARISON:  01/02/2013  FINDINGS: Cardiomediastinal silhouette is stable. No acute infiltrate or pulmonary edema. NG tube in place with tip in proximal stomach. There is right IJ central line with tip in right atrium. For distal SVC position Central line should be retracted about 1.5 cm. No pneumothorax.  IMPRESSION: Right IJ line with tip in right atrium. For distal SVC position central line should be retracted about 1.5 cm. No pneumothorax.   Electronically Signed   By: Lahoma Crocker M.D.   On: 10/16/2013 15:16   Dg Abd 2 Views  10/17/2013   CLINICAL DATA:  Small bowel obstruction  EXAM: ABDOMEN - 2 VIEW   COMPARISON:  Yesterday  FINDINGS: NG tube is coiled in the stomach with its tip in the fundus. Dilated small bowel loops with air-fluid levels persist. Colonic gas is present. No obvious free intraperitoneal gas.  IMPRESSION: NG tube placed.  Persistent partial small bowel obstruction pattern.   Electronically Signed   By: Maryclare Bean M.D.   On: 10/17/2013 07:50    Medications: . amiodarone  200 mg Oral Daily  . antiseptic oral rinse  15 mL Mouth Rinse q12n4p  . carvedilol  25 mg Oral BID  . chlorhexidine  15 mL Mouth Rinse BID  . heparin subcutaneous  5,000 Units Subcutaneous 3 times per day  . insulin aspart  0-9 Units Subcutaneous 6 times per day  . mycophenolate (CELLCEPT) IV  1,000 mg Intravenous Q12H  . sodium chloride  3 mL Intravenous Q12H  . tacrolimus  1 mg Oral QPM  . tacrolimus  2 mg Oral Q0600    Assessment/Plan SBO with hx of bowel resection, colostomy and reversal 1988  [facility] - On Tacrolimus  ESRD  Atrial fibrillation on Eliquis for anticoagulation on hold  AODM Insulin dependant  Hypertension   Plan:  Clamp NG now,  start clears and if no nausea pull NG at lunch time.  LOS: 2 days    [name] 10/18/2013

## 2013-10-18 NOTE — Progress Notes (Signed)
Having BM.  Hopefully get tube out.

## 2013-10-18 NOTE — Progress Notes (Signed)
Spoke with patient about diabetes and home regimen for diabetes control. Patient reports that he is followed by Kidney Doctor at [facility] for diabetes management and currently he takes 70/30 45 units in the morning with breakfast and 70/30 40 units with supper as an outpatient for diabetes control. Patient states that he checks his blood glucose 3-4 times per day and is runs between low 100's mg/dl to low 200's mg/dl.  Patient reports that he rarely has any low blood sugars and he can not even remember when the last time was that his glucose was low.  Inquired about knowledge about A1C and patient reports that he does know what an A1C is and that his A1C about one month ago was in the 8% range. Discussed A1C results (9.3% on 10/17/13) and stressed importance of checking CBGs and maintaining good CBG control to prevent long-term and short-term complications especially since patient has had a renal transplant. Patient states that he is not followed by an endocrinologist and he would like to be referred to an endocrinologist to help with improving diabetes control. Encouraged patient to talk with his kidney doctor at [facility] (has upcoming appointment on Monday) about referring him to an endocrinologist.  Patient verbalized understanding of information discussed and he states that he has no further questions at this time related to diabetes.   Thanks, Barnie Alderman, RN, MSN, CCRN Diabetes Coordinator Inpatient Diabetes Program [phone] (Team Pager) [phone] ([facility] office) [phone] [facility] office)

## 2013-10-18 NOTE — Progress Notes (Signed)
[facility] PROGRESS NOTE  [name] [id number] DOB: [birth year] DOA: 10/16/2013 PCP: Philis Fendt, MD  Assessment/Plan: Principal Problem:   SBO (small bowel obstruction): Continues to improve. NG tube removed. Patient tolerated clear liquids. Surgery following. Have started to restart by mouth medications. Advance and dinner to fully quit. Active Problems:   DM (diabetes mellitus), type 2, uncontrolled: Change sliding scale to 3 times a day with meals plus each bedtime    HTN (hypertension): Blood pressure starting to increase. Have restarted clonidine.    History of renal transplant: Stable. Appreciate nephrology's help in discussion with transplant doctors for medications.  Noting prolonged QT, continue to check EKG    Acute on chronic renal failure: Repeat labs in the morning   Atrial fibrillation: On subcutaneous heparin     Code Status: Full code  Family Communication: Spoke with wife yesterday  Disposition Plan: Continue in hospital. Likely home tomorrow   Consultants:  Nephrology  General surgery  Procedures:  None  Antibiotics:  None  HPI/Subjective: Patient doing okay. Having bowel movements. Tolerating clear liquids  Objective: Filed Vitals:   10/18/13 0934  BP: 168/82  Pulse: 71  Temp: 98.6 F (37 C)  Resp: 14    Intake/Output Summary (Last 24 hours) at 10/18/13 1347 Last data filed at 10/18/13 0913  Gross per 24 hour  Intake    670 ml  Output   2452 ml  Net  -1782 ml   Filed Weights   10/16/13 0540 10/16/13 2117 10/17/13 2114  Weight: 79.379 kg (175 lb) 84.5 kg (186 lb 4.6 oz) 79.9 kg (176 lb 2.4 oz)    Exam:   General:  No acute distress, ambulatory  Cardiovascular: Regular rate and rhythm, S1-S2  Respiratory: Clear to auscultation bilaterally  Abdomen: Soft nontender nondistended, positive bowel sounds  Musculoskeletal: No clubbing or cyanosis or edema   Data Reviewed: Basic Metabolic Panel:  Recent  Labs Lab 10/16/13 0758 10/16/13 0815 10/17/13 1745 10/18/13 0509  NA 135* 133* 139 142  K 4.2 3.8 4.0 3.7  CL 95* 101 101 104  CO2 27  --  22 24  GLUCOSE 75 73 147* 112*  BUN 33* 29* 18 15  CREATININE 2.71* 3.00* 1.52* 1.52*  CALCIUM 9.7  --  9.6 9.7   Liver Function Tests:  Recent Labs Lab 10/16/13 0758 10/17/13 1745  AST 36 28  ALT 32 26  ALKPHOS 88 92  BILITOT 0.5 0.7  PROT 6.5 6.6  ALBUMIN 3.5 3.3*   No results found for this basename: LIPASE, AMYLASE,  in the last 168 hours No results found for this basename: AMMONIA,  in the last 168 hours CBC:  Recent Labs Lab 10/16/13 0758 10/16/13 0815 10/17/13 1350  WBC 5.3  --  2.9*  NEUTROABS 4.2  --   --   HGB 11.5* 13.3 11.8*  HCT 37.3* 39.0 38.3*  MCV 83.8  --  83.6  PLT 181  --  209   Cardiac Enzymes: No results found for this basename: CKTOTAL, CKMB, CKMBINDEX, TROPONINI,  in the last 168 hours BNP (last 3 results) No results found for this basename: PROBNP,  in the last 8760 hours CBG:  Recent Labs Lab 10/17/13 2006 10/18/13 0003 10/18/13 0401 10/18/13 0748 10/18/13 1145  GLUCAP 149* 132* 105* 104* 175*    No results found for this or any previous visit (from the past 240 hour(s)).   Studies: Dg Chest Port 1 View  10/16/2013   CLINICAL DATA:  Status  post central line placement  EXAM: PORTABLE CHEST - 1 VIEW  COMPARISON:  Film from earlier in the same day.  FINDINGS: Cardiac shadow is within normal limits. A nasogastric catheter is again seen coiled within the stomach. A right jugular central line is again seen with the catheter tip at the cavoatrial junction in satisfactory position. The lungs are clear. No pneumothorax is noted. No other focal abnormality is seen.  IMPRESSION: Jugular central line in satisfactory position. No acute abnormality noted.   Electronically Signed   By: Inez Catalina M.D.   On: 10/16/2013 17:47   Dg Chest Port 1 View  10/16/2013   CLINICAL DATA:  Central line placement,  shortness of breath  EXAM: PORTABLE CHEST - 1 VIEW  COMPARISON:  01/02/2013  FINDINGS: Cardiomediastinal silhouette is stable. No acute infiltrate or pulmonary edema. NG tube in place with tip in proximal stomach. There is right IJ central line with tip in right atrium. For distal SVC position Central line should be retracted about 1.5 cm. No pneumothorax.  IMPRESSION: Right IJ line with tip in right atrium. For distal SVC position central line should be retracted about 1.5 cm. No pneumothorax.   Electronically Signed   By: Lahoma Crocker M.D.   On: 10/16/2013 15:16   Dg Abd 2 Views  10/17/2013   CLINICAL DATA:  Small bowel obstruction  EXAM: ABDOMEN - 2 VIEW  COMPARISON:  Yesterday  FINDINGS: NG tube is coiled in the stomach with its tip in the fundus. Dilated small bowel loops with air-fluid levels persist. Colonic gas is present. No obvious free intraperitoneal gas.  IMPRESSION: NG tube placed.  Persistent partial small bowel obstruction pattern.   Electronically Signed   By: Maryclare Bean M.D.   On: 10/17/2013 07:50    Scheduled Meds: . amiodarone  200 mg Oral Daily  . antiseptic oral rinse  15 mL Mouth Rinse q12n4p  . carvedilol  25 mg Oral BID  . chlorhexidine  15 mL Mouth Rinse BID  . cloNIDine  0.3 mg Oral TID  . furosemide  20 mg Oral Daily  . heparin subcutaneous  5,000 Units Subcutaneous 3 times per day  . insulin aspart  0-9 Units Subcutaneous 6 times per day  . mycophenolate  720 mg Oral BID  . pregabalin  100 mg Oral BID  . sodium chloride  3 mL Intravenous Q12H  . tacrolimus  1 mg Oral QPM  . tacrolimus  2 mg Oral Q0600   Continuous Infusions: . sodium chloride 75 mL/hr at 10/18/13 1155    Principal Problem:   SBO (small bowel obstruction) Active Problems:   DM (diabetes mellitus), type 2, uncontrolled   HTN (hypertension)   ESRD on dialysis   History of renal transplant   Acute on chronic renal failure   Atrial fibrillation    Time spent: 15 minutes    [facility] Pager [phone]. If 7PM-7AM, please contact night-coverage at [email or web address], password [facility] 10/18/2013, 1:47 PM  LOS: 2 days

## 2013-10-18 NOTE — Progress Notes (Signed)
Impression/Plan  1. Renal transplant (05/2013) with AKI on CKD . AKI, improved       2. SBO  Subjective: Interval History: Having BM, c/o neuropathy while off Lyrica  Objective: Vital signs in last 24 hours: Temp:  [98.2 F (36.8 C)-98.6 F (37 C)] 98.5 F (36.9 C) (06/11 0524) Pulse Rate:  [70-84] 74 (06/11 0524) Resp:  [16-18] 18 (06/11 0524) BP: (149-187)/(78-91) 169/78 mmHg (06/11 0524) SpO2:  [96 %-98 %] 98 % (06/11 0524) Weight:  [79.9 kg (176 lb 2.4 oz)] 79.9 kg (176 lb 2.4 oz) (06/10 2114) Weight change: -4.6 kg (-10 lb 2.3 oz)  Intake/Output from previous day: 06/10 0701 - 06/11 0700 In: 550 [I.V.:550] Out: 2954 [Urine:1350; Emesis/NG output:1600; Stool:4] Intake/Output this shift:    Resp: clear to auscultation bilaterally Chest wall: no tenderness GI: soft, non-tender; bowel sounds normal; no masses,  no organomegaly Extremities: extremities normal, atraumatic, no cyanosis or edema  Lab Results:  Recent Labs  10/16/13 0758 10/16/13 0815 10/17/13 1350  WBC 5.3  --  2.9*  HGB 11.5* 13.3 11.8*  HCT 37.3* 39.0 38.3*  PLT 181  --  209   BMET:  Recent Labs  10/17/13 1745 10/18/13 0509  NA 139 142  K 4.0 3.7  CL 101 104  CO2 22 24  GLUCOSE 147* 112*  BUN 18 15  CREATININE 1.52* 1.52*  CALCIUM 9.6 9.7   No results found for this basename: PTH,  in the last 72 hours Iron Studies: No results found for this basename: IRON, TIBC, TRANSFERRIN, FERRITIN,  in the last 72 hours Studies/Results: Ct Abdomen Pelvis Wo Contrast  10/16/2013   CLINICAL DATA:  Abdominal pain.  EXAM: CT ABDOMEN AND PELVIS WITHOUT CONTRAST  TECHNIQUE: Multidetector CT imaging of the abdomen and pelvis was performed following the standard protocol without IV contrast.  COMPARISON:  Radiographs 10/16/2013.  FINDINGS: Bones: No aggressive osseous lesions are present. Lumbar spondylosis with calcified disc protrusions at L3-L4 and L4-L5. Thoracic spine DISH. Left-greater-than-right  sacroiliac joint degenerative disease.  Lung Bases: Dependent atelectasis.  Liver: Unenhanced CT was performed per clinician order. Lack of IV contrast limits sensitivity and specificity, especially for evaluation of abdominal/pelvic solid viscera. Nonspecific low-density lesion is present in the right hepatic lobe measuring 1 cm (image 20 series 2). Statistically this likely represents a cyst.  Spleen: Within normal limits. Small 1 cm low-density lesion over the superior spleen probably represents cyst or hemangioma.  Gallbladder: No calcified stones. No inflammatory changes by noncontrast CT.  Common bile duct:  Normal.  Pancreas:  Normal.  Adrenal glands:  Normal bilaterally.  Kidneys: Polycystic kidneys are present. There is no hydronephrosis. No discrete mass lesion is identified. The ureters are within normal limits. Right iliac fossa renal allograft is present, also without hydronephrosis. The allograft ureter appears within normal limits.  Stomach: Small hiatal hernia. Thickening of the distal thoracic esophagus near the hernia suggesting reflux.  Small bowel: Duodenum appears normal. There is progressive dilation of small bowel in the abdomen. Dilated bowel appears to involve the jejunum. The ileum is decompressed. Small bowel anastomosis is present in the left central abdomen which appears patent. The ileum appears completely decompressed. Evaluation of the point of obstruction is degraded by paucity of oral contrast.  Colon: Normal appendix. Large stool burden in the right colon. With a large amount of stool suggests relatively acute high-grade small bowel obstruction. Distal colon appears within normal limits.  Pelvic Genitourinary:  Urinary bladder normal.  No free fluid.  Vasculature: Atherosclerosis. No gross noncontrast vascular abnormality.  Body Wall: Stranding along the right flank may represent postsurgical scarring in this patient with a renal allograft.  IMPRESSION: 1. Mechanical small bowel  obstruction appears high-grade if not complete based on decompressed ileum. The transition point is not identified. 2. Polycystic kidney disease with right iliac fossa renal allograft. No complicating features of the renal allograft. 3. Low-density lesions in the liver and spleen, probably representing cysts and/or hemangioma. 4. Small hiatal hernia with distal esophageal thickening suggesting reflux.   Electronically Signed   By: Dereck Ligas M.D.   On: 10/16/2013 11:46   Dg Chest Port 1 View  10/16/2013   CLINICAL DATA:  Status post central line placement  EXAM: PORTABLE CHEST - 1 VIEW  COMPARISON:  Film from earlier in the same day.  FINDINGS: Cardiac shadow is within normal limits. A nasogastric catheter is again seen coiled within the stomach. A right jugular central line is again seen with the catheter tip at the cavoatrial junction in satisfactory position. The lungs are clear. No pneumothorax is noted. No other focal abnormality is seen.  IMPRESSION: Jugular central line in satisfactory position. No acute abnormality noted.   Electronically Signed   By: Inez Catalina M.D.   On: 10/16/2013 17:47   Dg Chest Port 1 View  10/16/2013   CLINICAL DATA:  Central line placement, shortness of breath  EXAM: PORTABLE CHEST - 1 VIEW  COMPARISON:  01/02/2013  FINDINGS: Cardiomediastinal silhouette is stable. No acute infiltrate or pulmonary edema. NG tube in place with tip in proximal stomach. There is right IJ central line with tip in right atrium. For distal SVC position Central line should be retracted about 1.5 cm. No pneumothorax.  IMPRESSION: Right IJ line with tip in right atrium. For distal SVC position central line should be retracted about 1.5 cm. No pneumothorax.   Electronically Signed   By: Lahoma Crocker M.D.   On: 10/16/2013 15:16   Dg Abd 2 Views  10/17/2013   CLINICAL DATA:  Small bowel obstruction  EXAM: ABDOMEN - 2 VIEW  COMPARISON:  Yesterday  FINDINGS: NG tube is coiled in the stomach with its  tip in the fundus. Dilated small bowel loops with air-fluid levels persist. Colonic gas is present. No obvious free intraperitoneal gas.  IMPRESSION: NG tube placed.  Persistent partial small bowel obstruction pattern.   Electronically Signed   By: Maryclare Bean M.D.   On: 10/17/2013 07:50   Scheduled: . amiodarone  200 mg Oral Daily  . antiseptic oral rinse  15 mL Mouth Rinse q12n4p  . carvedilol  25 mg Oral BID  . chlorhexidine  15 mL Mouth Rinse BID  . heparin subcutaneous  5,000 Units Subcutaneous 3 times per day  . insulin aspart  0-9 Units Subcutaneous 6 times per day  . mycophenolate (CELLCEPT) IV  1,000 mg Intravenous Q12H  . sodium chloride  3 mL Intravenous Q12H  . tacrolimus  1 mg Oral QPM  . tacrolimus  2 mg Oral Q0600     LOS: 2 days   Seraphina Mitchner C 10/18/2013,9:11 AM

## 2013-10-19 DIAGNOSIS — I4891 Unspecified atrial fibrillation: Secondary | ICD-10-CM

## 2013-10-19 LAB — GLUCOSE, CAPILLARY
Glucose-Capillary: 157 mg/dL — ABNORMAL HIGH (ref 70–99)
Glucose-Capillary: 206 mg/dL — ABNORMAL HIGH (ref 70–99)

## 2013-10-19 MED ORDER — INSULIN ASPART PROT & ASPART (70-30 MIX) 100 UNIT/ML ~~LOC~~ SUSP: SUBCUTANEOUS | Status: DC

## 2013-10-19 MED ORDER — FUROSEMIDE 20 MG PO TABS
20.0000 mg | ORAL_TABLET | ORAL | Status: AC
  Administered 2013-10-19: 20 mg via ORAL
  Filled 2013-10-19: qty 1

## 2013-10-19 MED ORDER — INSULIN ASPART PROT & ASPART (70-30 MIX) 100 UNIT/ML ~~LOC~~ SUSP
20.0000 [IU] | Freq: Two times a day (BID) | SUBCUTANEOUS | Status: DC
  Administered 2013-10-19: 20 [IU] via SUBCUTANEOUS
  Filled 2013-10-19: qty 10

## 2013-10-19 MED ORDER — NIFEDIPINE ER 60 MG PO TB24
60.0000 mg | ORAL_TABLET | Freq: Every day | ORAL | Status: DC
  Administered 2013-10-19: 60 mg via ORAL
  Filled 2013-10-19: qty 1

## 2013-10-19 NOTE — Progress Notes (Signed)
Impression/Plan  1. Renal transplant (05/2013) with AKI on CKD . AKI, improved 2. SBO   He is better and primary team transitioning to PO meds and plan for discharge.  We will sign off.  Mekesha Solomon C

## 2013-10-19 NOTE — Progress Notes (Signed)
Patient discharge teaching given, including activity, diet, follow-up appoints, and medications. Patient verbalized understanding of all discharge instructions. IV access was d/c'd. Vitals are stable. Skin is intact except as charted in most recent assessments. Pt to be escorted out by NT, to be driven home by family.  Dianca Owensby, MBA, BS, RN 

## 2013-10-19 NOTE — Discharge Summary (Signed)
Physician Discharge Summary  [name] [id number] DOB: [birth year] DOA: 10/16/2013  PCP: Philis Fendt, MD  Admit date: 10/16/2013 Discharge date: 10/19/2013  Time spent: 25 minutes  Recommendations for Outpatient Follow-up:  1. Patient will followup with primary care physician in the next month as needed 2. Patient will followup with his transplant doctor next month  Discharge Diagnoses:  Principal Problem:   SBO (small bowel obstruction) Active Problems:   DM (diabetes mellitus), type 2, uncontrolled   HTN (hypertension)   ESRD on dialysis   History of renal transplant   Acute on chronic renal failure   Atrial fibrillation   Prolonged Q-T interval on ECG   Discharge Condition: Improved, being discharged home  Diet recommendation: Heart healthy, carb modified  Filed Weights   10/16/13 2117 10/17/13 2114 10/19/13 0425  Weight: 84.5 kg (186 lb 4.6 oz) 79.9 kg (176 lb 2.4 oz) 82.3 kg (181 lb 7 oz)    History of present illness:  58 year old male with past medical history of chronic kidney disease status post renal transplant in January as well as diabetes and atrial fibrillation on anticoagulation who presented to the emergency room on 6/9 with complaints of abdominal distention and pain x1 day with no bowel movement x2 days. He was found to have a large small bowel obstruction and admitted to the hospitalist service with surgery consultation. In addition, he was found to be in acute renal failure with a creatinine of 3. After discussion with his transplant doctor, patient was given several transplant medications IV.  Patient underwent NG tube placement.  Hospital Course:  Principal Problem:   SBO (small bowel obstruction): X-ray on the morning of hospital day 2 showed no change, however by later that morning, patient was starting to have positive flatus and bowel movements later that day. He is feeling much better and monitored closely. Surgery following as well. NG tube  clamped and then later removed on hospital day 3. Patient started on clear liquids which were able to be advanced by that evening. By 6/12, patient tolerating solid food and stable to be discharged home.  Active Problems:   DM (diabetes mellitus), type 2, uncontrolled: Patient initially on sliding scale only. Started on low dose 70/30 insulin on day of discharge    HTN (hypertension): Initially low blood pressures likely in the setting of dehydration. Patient gently hydrated and started having elevated blood pressures in the systolic range 0000000. Put on when necessary hydralazine and restarting of home meds.    History of renal transplant: Discussed with patient's transplant doctor. Herecommends giving the patient CellCept 1 g twice a day instead of Myfortic. Also recommends giving Prograf after clamping the NG tube. Patient did well resume his home medications upon discharge    Acute on chronic renal failure: Following hydration, creatinine down to 1.5 to the following day which is his baseline and has stayed there since    Atrial fibrillation: Patient was continued with IV amiodarone and beta blocker. Monitor on telemetry. Patient put on subcutaneous heparin for prophylaxis. Eliquis on hold initially given concerns for possible surgery. Once small bowel structure and found to have resolved on its own, eliquis safe to be restarted.    Prolonged Q-T interval on ECG: As per pharmacy recommendations, patient on tacrolimus plus amiodarone and in the setting of when necessary Zofran concerns for QT prolongation. Followup EKG did note some QT prolongation on 6/11, however Zofran discontinued and issues since stabilized  Procedures:  None  Consultations:  Nephrology  General surgery  Discharge Exam: Filed Vitals:   10/19/13 0951  BP: 182/88  Pulse: 62  Temp: 98.2 F (36.8 C)  Resp: 16    General: Alert and oriented x3, no acute distress Cardiovascular: Regular rate and rhythm,  S1-S2 Respiratory: Clear to auscultation bilaterally Abdomen: Soft, nontender, nondistended, positive bowel sounds Extremities: No clubbing or cyanosis or edema  Discharge Instructions You were cared for by a hospitalist during your hospital stay. If you have any questions about your discharge medications or the care you received while you were in the hospital after you are discharged, you can call the unit and asked to speak with the hospitalist on call if the hospitalist that took care of you is not available. Once you are discharged, your primary care physician will handle any further medical issues. Please note that NO REFILLS for any discharge medications will be authorized once you are discharged, as it is imperative that you return to your primary care physician (or establish a relationship with a primary care physician if you do not have one) for your aftercare needs so that they can reassess your need for medications and monitor your lab values.  Discharge Instructions   Diet - low sodium heart healthy    Complete by:  As directed      Increase activity slowly    Complete by:  As directed             Medication List         acetaminophen 500 MG tablet  Commonly known as:  TYLENOL  Take 500 mg by mouth every 6 (six) hours as needed.     amiodarone 400 MG tablet  Commonly known as:  PACERONE  Take 200 mg by mouth daily.     apixaban 5 MG Tabs tablet  Commonly known as:  ELIQUIS  Take 5 mg by mouth 2 (two) times daily.     aspirin 81 MG chewable tablet  Chew 81 mg by mouth daily.     carvedilol 25 MG tablet  Commonly known as:  COREG  Take 25 mg by mouth 2 (two) times daily with a meal.     cloNIDine 0.3 MG tablet  Commonly known as:  CATAPRES  Take 0.3 mg by mouth 3 (three) times daily.     docusate sodium 100 MG capsule  Commonly known as:  COLACE  Take 100 mg by mouth 2 (two) times daily as needed for mild constipation.     furosemide 20 MG tablet  Commonly  known as:  LASIX  Take 20 mg by mouth daily.     insulin aspart protamine- aspart (70-30) 100 UNIT/ML injection  Commonly known as:  NOVOLOG MIX 70/30  Inject 25-35 Units into the skin 2 (two) times daily with a meal. Injects 50 units every morning and injects 35 units every evening     magnesium oxide 400 MG tablet  Commonly known as:  MAG-OX  Take 400 mg by mouth daily.     mycophenolate 180 MG EC tablet  Commonly known as:  MYFORTIC  Take 720 mg by mouth 2 (two) times daily.     NIFEdipine 60 MG 24 hr tablet  Commonly known as:  PROCARDIA XL/ADALAT-CC  Take 60 mg by mouth daily.     omeprazole 20 MG capsule  Commonly known as:  PRILOSEC  Take 20 mg by mouth daily.     oxyCODONE-acetaminophen 5-325 MG per tablet  Commonly known as:  PERCOCET/ROXICET  Take 1  tablet by mouth every 4 (four) hours as needed for pain.     polyethylene glycol packet  Commonly known as:  MIRALAX / GLYCOLAX  Take 17 g by mouth daily as needed.     pravastatin 40 MG tablet  Commonly known as:  PRAVACHOL  Take 40 mg by mouth every Monday, Wednesday, and Friday at 6 PM.     pregabalin 100 MG capsule  Commonly known as:  LYRICA  Take 100 mg by mouth 2 (two) times daily.     sodium bicarbonate 650 MG tablet  Take 650 mg by mouth 2 (two) times daily.     sulfamethoxazole-trimethoprim 400-80 MG per tablet  Commonly known as:  BACTRIM,SEPTRA  Take 1 tablet by mouth every Monday, Wednesday, and Friday.     tacrolimus 1 MG capsule  Commonly known as:  PROGRAF  Take 1-2 mg by mouth 2 (two) times daily. 2mg  in AM and 1mg  in PM       No Known Allergies     Follow-up Information   Follow up with AVBUERE,EDWIN A, MD In 1 month. (As needed)    Specialty:  Internal Medicine   Contact information:   [address] [place] [address] [phone]       Follow up with STRATTA,ROBERT, MD In 1 month.   Specialty:  Transplant   Contact information:   [place] [address] [phone]        The results of significant diagnostics from this hospitalization (including imaging, microbiology, ancillary and laboratory) are listed below for reference.    Significant Diagnostic Studies: Ct Abdomen Pelvis Wo Contrast  10/16/2013   CLINICAL DATA:  Abdominal pain.  EXAM: CT ABDOMEN AND PELVIS WITHOUT CONTRAST  TECHNIQUE: Multidetector CT imaging of the abdomen and pelvis was performed following the standard protocol without IV contrast.  COMPARISON:  Radiographs 10/16/2013.  FINDINGS: Bones: No aggressive osseous lesions are present. Lumbar spondylosis with calcified disc protrusions at L3-L4 and L4-L5. Thoracic spine DISH. Left-greater-than-right sacroiliac joint degenerative disease.  Lung Bases: Dependent atelectasis.  Liver: Unenhanced CT was performed per clinician order. Lack of IV contrast limits sensitivity and specificity, especially for evaluation of abdominal/pelvic solid viscera. Nonspecific low-density lesion is present in the right hepatic lobe measuring 1 cm (image 20 series 2). Statistically this likely represents a cyst.  Spleen: Within normal limits. Small 1 cm low-density lesion over the superior spleen probably represents cyst or hemangioma.  Gallbladder: No calcified stones. No inflammatory changes by noncontrast CT.  Common bile duct:  Normal.  Pancreas:  Normal.  Adrenal glands:  Normal bilaterally.  Kidneys: Polycystic kidneys are present. There is no hydronephrosis. No discrete mass lesion is identified. The ureters are within normal limits. Right iliac fossa renal allograft is present, also without hydronephrosis. The allograft ureter appears within normal limits.  Stomach: Small hiatal hernia. Thickening of the distal thoracic esophagus near the hernia suggesting reflux.  Small bowel: Duodenum appears normal. There is progressive dilation of small bowel in the abdomen. Dilated bowel appears to involve the jejunum. The ileum is  decompressed. Small bowel anastomosis is present in the left central abdomen which appears patent. The ileum appears completely decompressed. Evaluation of the point of obstruction is degraded by paucity of oral contrast.  Colon: Normal appendix. Large stool burden in the right colon. With a large amount of stool suggests relatively acute high-grade small bowel obstruction. Distal colon appears within normal limits.  Pelvic Genitourinary:  Urinary bladder normal.  No free fluid.  Vasculature: Atherosclerosis. No gross noncontrast vascular abnormality.  Body Wall: Stranding along the right flank may represent postsurgical scarring in this patient with a renal allograft.  IMPRESSION: 1. Mechanical small bowel obstruction appears high-grade if not complete based on decompressed ileum. The transition point is not identified. 2. Polycystic kidney disease with right iliac fossa renal allograft. No complicating features of the renal allograft. 3. Low-density lesions in the liver and spleen, probably representing cysts and/or hemangioma. 4. Small hiatal hernia with distal esophageal thickening suggesting reflux.   Electronically Signed   By: Dereck Ligas M.D.   On: 10/16/2013 11:46   Dg Chest Port 1 View  10/16/2013   CLINICAL DATA:  Status post central line placement  EXAM: PORTABLE CHEST - 1 VIEW  COMPARISON:  Film from earlier in the same day.  FINDINGS: Cardiac shadow is within normal limits. A nasogastric catheter is again seen coiled within the stomach. A right jugular central line is again seen with the catheter tip at the cavoatrial junction in satisfactory position. The lungs are clear. No pneumothorax is noted. No other focal abnormality is seen.  IMPRESSION: Jugular central line in satisfactory position. No acute abnormality noted.   Electronically Signed   By: Inez Catalina M.D.   On: 10/16/2013 17:47   Dg Chest Port 1 View  10/16/2013   CLINICAL DATA:  Central line placement, shortness of breath  EXAM:  PORTABLE CHEST - 1 VIEW  COMPARISON:  01/02/2013  FINDINGS: Cardiomediastinal silhouette is stable. No acute infiltrate or pulmonary edema. NG tube in place with tip in proximal stomach. There is right IJ central line with tip in right atrium. For distal SVC position Central line should be retracted about 1.5 cm. No pneumothorax.  IMPRESSION: Right IJ line with tip in right atrium. For distal SVC position central line should be retracted about 1.5 cm. No pneumothorax.   Electronically Signed   By: Lahoma Crocker M.D.   On: 10/16/2013 15:16   Dg Abd 2 Views  10/17/2013   CLINICAL DATA:  Small bowel obstruction  EXAM: ABDOMEN - 2 VIEW  COMPARISON:  Yesterday  FINDINGS: NG tube is coiled in the stomach with its tip in the fundus. Dilated small bowel loops with air-fluid levels persist. Colonic gas is present. No obvious free intraperitoneal gas.  IMPRESSION: NG tube placed.  Persistent partial small bowel obstruction pattern.   Electronically Signed   By: Maryclare Bean M.D.   On: 10/17/2013 07:50   Dg Abd 2 Views  10/16/2013   CLINICAL DATA:  Abdominal pain and distention for 5 hr. Constipation.  EXAM: ABDOMEN - 2 VIEW  COMPARISON:  Barium enema 02/25/2011  FINDINGS: Gas and stool throughout the colon with moderate dilatation suggesting ileus. Bowel staples in the left mid abdomen. Suggestion of small bowel dilatation and air-fluid levels. Small bowel obstruction is not excluded. Changes could also be related to constipation. No free intra-abdominal air. No radiopaque stones. Degenerative changes in the spine.  IMPRESSION: Gas and stool in the colon suggesting constipation and ileus. Distention of left abdominal small bowel with air-fluid levels may indicate obstruction.   Electronically Signed   By: Lucienne Capers M.D.   On: 10/16/2013 06:57    Microbiology: No results found for this or any previous visit (from the past 240 hour(s)).   Labs: Basic Metabolic Panel:  Recent Labs Lab 10/16/13 0758  10/16/13 0815 10/17/13 1745 10/18/13 0509  NA 135* 133* 139 142  K 4.2 3.8 4.0 3.7  CL 95* 101 101 104  CO2 27  --  22 24  GLUCOSE 75 73 147* 112*  BUN 33* 29* 18 15  CREATININE 2.71* 3.00* 1.52* 1.52*  CALCIUM 9.7  --  9.6 9.7   Liver Function Tests:  Recent Labs Lab 10/16/13 0758 10/17/13 1745  AST 36 28  ALT 32 26  ALKPHOS 88 92  BILITOT 0.5 0.7  PROT 6.5 6.6  ALBUMIN 3.5 3.3*   No results found for this basename: LIPASE, AMYLASE,  in the last 168 hours No results found for this basename: AMMONIA,  in the last 168 hours CBC:  Recent Labs Lab 10/16/13 0758 10/16/13 0815 10/17/13 1350  WBC 5.3  --  2.9*  NEUTROABS 4.2  --   --   HGB 11.5* 13.3 11.8*  HCT 37.3* 39.0 38.3*  MCV 83.8  --  83.6  PLT 181  --  209   Cardiac Enzymes: No results found for this basename: CKTOTAL, CKMB, CKMBINDEX, TROPONINI,  in the last 168 hours BNP: BNP (last 3 results) No results found for this basename: PROBNP,  in the last 8760 hours CBG:  Recent Labs Lab 10/18/13 0748 10/18/13 1145 10/18/13 1552 10/18/13 2033 10/19/13 0727  GLUCAP 104* 175* 185* 258* 157*       Signed:  Tiffanni Scarfo K  [facility] 10/19/2013, 11:50 AM

## 2013-10-19 NOTE — Progress Notes (Signed)
  Subjective: Doing well with NG out yesterday, tolerating diet without significant pain or nausea. Pt reports several "good" BM's yesterday. Otherwise without specific complaints.  Objective: Vital signs in last 24 hours: Temp:  [98.2 F (36.8 C)-98.6 F (37 C)] 98.2 F (36.8 C) (06/12 0425) Pulse Rate:  [64-71] 64 (06/12 0425) Resp:  [14-18] 18 (06/12 0425) BP: (138-168)/(81-83) 161/81 mmHg (06/12 0425) SpO2:  [98 %-99 %] 99 % (06/12 0425) Weight:  [181 lb 7 oz (82.3 kg)] 181 lb 7 oz (82.3 kg) (06/12 0425) Last BM Date: 10/18/13  Intake/Output from previous day: 06/11 0701 - 06/12 0700 In: 840 [P.O.:840] Out: -  Intake/Output this shift:    Physical Exam: Gen: adult male in NAD, sitting up at bedside in NAD, eating breakfast Cardio: RRR, no murmur appreciated Pulm: CTAB, no wheezes Abd: soft, nontender, BS+; numerous previous surgical scars present  No peritoneal signs, pt changes position in bed easily without assistance Ext: warm, well-perfused, no LE edema  Lab Results:   Recent Labs  10/17/13 1350  WBC 2.9*  HGB 11.8*  HCT 38.3*  PLT 209    BMET  Recent Labs  10/17/13 1745 10/18/13 0509  NA 139 142  K 4.0 3.7  CL 101 104  CO2 22 24  GLUCOSE 147* 112*  BUN 18 15  CREATININE 1.52* 1.52*  CALCIUM 9.6 9.7   PT/INR No results found for this basename: LABPROT, INR,  in the last 72 hours   Recent Labs Lab 10/16/13 0758 10/17/13 1745  AST 36 28  ALT 32 26  ALKPHOS 88 92  BILITOT 0.5 0.7  PROT 6.5 6.6  ALBUMIN 3.5 3.3*     Lipase  No results found for this basename: lipase     Studies/Results: No results found.  Medications: . amiodarone  200 mg Oral Daily  . antiseptic oral rinse  15 mL Mouth Rinse q12n4p  . carvedilol  25 mg Oral BID  . chlorhexidine  15 mL Mouth Rinse BID  . cloNIDine  0.3 mg Oral TID  . furosemide  20 mg Oral Daily  . heparin subcutaneous  5,000 Units Subcutaneous 3 times per day  . insulin aspart  0-5 Units  Subcutaneous QHS  . insulin aspart  0-9 Units Subcutaneous TID WC  . insulin aspart protamine- aspart  20 Units Subcutaneous BID WC  . mycophenolate  720 mg Oral BID  . pregabalin  100 mg Oral BID  . sodium chloride  10-40 mL Intracatheter Q12H  . sodium chloride  3 mL Intravenous Q12H  . tacrolimus  1 mg Oral QPM  . tacrolimus  2 mg Oral Q0600    Assessment/Plan 1. SBO with hx of bowel resection, colostomy and reversal 1988 - improved / resolved s/p bowel rest and NG tube 2. [facility] On Tacrolimus  3. ESRD  4. Atrial fibrillation on Eliquis for anticoagulation on hold  5. AODM Insulin dependant  6. Hypertension  Plan: Diet already advanced to carb modified. Continue to mobilize / encourage OOB. Otherwise per primary service. No acute surgical needs at this time. Discussed with Theodoro Grist, PA - surgery will sign off. Please do not hesitate to call or re-consult if needed.   LOS: 3 days   Please see also pending attending cosign for any edits / additions.   Emmaline Kluver, MD CCS General Surgery (PGY-2, [place]) 10/19/2013, 9:02 AM

## 2013-10-19 NOTE — Discharge Instructions (Signed)
Small Bowel Obstruction A small bowel obstruction is a blockage (obstruction) of the small intestine (small bowel). The small bowel is a long, slender tube that connects the stomach to the colon. Its job is to absorb nutrients from the fluids and foods you consume into the bloodstream.  CAUSES  There are many causes of intestinal blockage. The most common ones include:  Hernias. This is a more common cause in children than adults.  Inflammatory bowel disease (enteritis and colitis).  Twisting of the bowel (volvulus).  Tumors.  Scar tissue (adhesions) from previous surgery or radiation treatment.  Recent surgery. This may cause an acute small bowel obstruction called an ileus. SYMPTOMS   Abdominal pain. This may be dull cramps or sharp pain. It may occur in one area or may be present in the entire abdomen. Pain can range from mild to severe, depending on the degree of obstruction.  Nausea and vomiting. Vomit may be greenish or yellow bile color.  Distended or swollen stomach. Abdominal bloating is a common symptom.  Constipation.  Lack of passing gas.  Frequent belching.  Diarrhea. This may occur if runny stool is able to leak around the obstruction. DIAGNOSIS  Your caregiver can usually diagnose small bowel obstruction by taking a history, doing a physical exam, and taking X-rays. If the cause is unclear, a CT scan (computerized tomography) of your abdomen and pelvis may be needed. TREATMENT  Treatment of the blockage depends on the cause and how bad the problem is.   Sometimes, the obstruction improves with bed rest and intravenous (IV) fluids.  Resting the bowel is very important. This means following a simple diet. Sometimes, a clear liquid diet may be required for several days.  Sometimes, a small tube (nasogastric tube) is placed into the stomach to decompress the bowel. When the bowel is blocked, it usually swells up like a balloon filled with air and fluids.  Decompression means that the air and fluids are removed by suction through that tube. This can help with pain, discomfort, and nausea. It can also help the obstruction resolve faster.  Surgery may be required if other treatments do not work. Bowel obstruction from a hernia may require early surgery and can be an emergency procedure. Adhesions that cause frequent or severe obstructions may also require surgery. HOME CARE INSTRUCTIONS If your bowel obstruction is only partial or incomplete, you may be allowed to go home.  Get plenty of rest.  Follow your diet as directed by your caregiver.  Only consume clear liquids until your condition improves.  Avoid solid foods as instructed. SEEK IMMEDIATE MEDICAL CARE IF:  You have increased pain or cramping.  You vomit blood.  You have uncontrolled vomiting or nausea.  You cannot drink fluids due to vomiting or pain.  You develop confusion.  You begin feeling very dry or thirsty (dehydrated).  You have severe bloating.  You have chills.  You have a fever.  You feel extremely weak or you faint. MAKE SURE YOU:  Understand these instructions.  Will watch your condition.  Will get help right away if you are not doing well or get worse. Document Released: 07/13/2005 Document Revised: 07/19/2011 Document Reviewed: 07/10/2010 Exit[employer] Patient Information 2014 [employer].  

## 2013-10-19 NOTE — Progress Notes (Signed)
Once eating well can restart anticoagulation

## 2013-10-22 DIAGNOSIS — Z940 Kidney transplant status: Secondary | ICD-10-CM | POA: Diagnosis not present

## 2013-10-22 DIAGNOSIS — I10 Essential (primary) hypertension: Secondary | ICD-10-CM | POA: Diagnosis not present

## 2013-10-22 DIAGNOSIS — E1129 Type 2 diabetes mellitus with other diabetic kidney complication: Secondary | ICD-10-CM | POA: Diagnosis not present

## 2013-10-22 DIAGNOSIS — E872 Acidosis, unspecified: Secondary | ICD-10-CM | POA: Diagnosis not present

## 2013-10-22 DIAGNOSIS — Z7901 Long term (current) use of anticoagulants: Secondary | ICD-10-CM | POA: Diagnosis not present

## 2013-10-22 DIAGNOSIS — D899 Disorder involving the immune mechanism, unspecified: Secondary | ICD-10-CM | POA: Diagnosis not present

## 2013-10-31 DIAGNOSIS — Z79899 Other long term (current) drug therapy: Secondary | ICD-10-CM | POA: Diagnosis not present

## 2013-10-31 DIAGNOSIS — Z940 Kidney transplant status: Secondary | ICD-10-CM | POA: Diagnosis not present
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
## 2013-11-04 NOTE — ED Provider Notes (Signed)
History/physical exam/procedure(s) were performed by non-physician practitioner and as supervising physician I was immediately available for consultation/collaboration. I have reviewed all notes and am in agreement with care and plan.   Shaune Pollack, MD 11/04/13 1540

## 2013-11-05 DIAGNOSIS — I10 Essential (primary) hypertension: Secondary | ICD-10-CM | POA: Diagnosis not present

## 2013-11-05 DIAGNOSIS — M5137 Other intervertebral disc degeneration, lumbosacral region: Secondary | ICD-10-CM | POA: Diagnosis not present

## 2013-11-05 DIAGNOSIS — Z13220 Encounter for screening for lipoid disorders: Secondary | ICD-10-CM | POA: Diagnosis not present

## 2013-11-05 DIAGNOSIS — E1129 Type 2 diabetes mellitus with other diabetic kidney complication: Secondary | ICD-10-CM | POA: Diagnosis not present

## 2013-11-05 DIAGNOSIS — M51379 Other intervertebral disc degeneration, lumbosacral region without mention of lumbar back pain or lower extremity pain: Secondary | ICD-10-CM | POA: Diagnosis not present

## 2013-11-05 DIAGNOSIS — E1165 Type 2 diabetes mellitus with hyperglycemia: Secondary | ICD-10-CM | POA: Diagnosis not present

## 2013-11-08 DIAGNOSIS — Z794 Long term (current) use of insulin: Secondary | ICD-10-CM | POA: Diagnosis not present

## 2013-11-08 DIAGNOSIS — D899 Disorder involving the immune mechanism, unspecified: Secondary | ICD-10-CM | POA: Diagnosis not present

## 2013-11-08 DIAGNOSIS — Z79899 Other long term (current) drug therapy: Secondary | ICD-10-CM | POA: Diagnosis not present

## 2013-11-08 DIAGNOSIS — E785 Hyperlipidemia, unspecified: Secondary | ICD-10-CM | POA: Diagnosis not present

## 2013-11-08 DIAGNOSIS — I10 Essential (primary) hypertension: Secondary | ICD-10-CM | POA: Diagnosis not present

## 2013-11-08 DIAGNOSIS — N186 End stage renal disease: Secondary | ICD-10-CM | POA: Diagnosis not present

## 2013-11-08 DIAGNOSIS — IMO0002 Reserved for concepts with insufficient information to code with codable children: Secondary | ICD-10-CM | POA: Diagnosis not present

## 2013-11-08 DIAGNOSIS — E119 Type 2 diabetes mellitus without complications: Secondary | ICD-10-CM | POA: Diagnosis not present

## 2013-11-08 DIAGNOSIS — Z006 Encounter for examination for normal comparison and control in clinical research program: Secondary | ICD-10-CM | POA: Diagnosis not present

## 2013-11-08 DIAGNOSIS — E1129 Type 2 diabetes mellitus with other diabetic kidney complication: Secondary | ICD-10-CM | POA: Diagnosis not present

## 2013-11-08 DIAGNOSIS — I120 Hypertensive chronic kidney disease with stage 5 chronic kidney disease or end stage renal disease: Secondary | ICD-10-CM | POA: Diagnosis not present

## 2013-11-08 DIAGNOSIS — Z298 Encounter for other specified prophylactic measures: Secondary | ICD-10-CM | POA: Diagnosis not present

## 2013-11-08 DIAGNOSIS — Z940 Kidney transplant status: Secondary | ICD-10-CM | POA: Diagnosis not present

## 2013-11-08 DIAGNOSIS — Z792 Long term (current) use of antibiotics: Secondary | ICD-10-CM | POA: Diagnosis not present

## 2013-11-08 DIAGNOSIS — Z48298 Encounter for aftercare following other organ transplant: Secondary | ICD-10-CM | POA: Diagnosis not present

## 2013-11-13 DIAGNOSIS — I10 Essential (primary) hypertension: Secondary | ICD-10-CM | POA: Diagnosis not present

## 2013-11-13 DIAGNOSIS — Z7901 Long term (current) use of anticoagulants: Secondary | ICD-10-CM | POA: Diagnosis not present

## 2013-11-13 DIAGNOSIS — R7989 Other specified abnormal findings of blood chemistry: Secondary | ICD-10-CM | POA: Insufficient documentation

## 2013-11-13 DIAGNOSIS — E872 Acidosis, unspecified: Secondary | ICD-10-CM | POA: Diagnosis not present

## 2013-11-13 DIAGNOSIS — E875 Hyperkalemia: Secondary | ICD-10-CM | POA: Diagnosis not present

## 2013-11-13 DIAGNOSIS — Z48298 Encounter for aftercare following other organ transplant: Secondary | ICD-10-CM | POA: Diagnosis not present

## 2013-11-13 DIAGNOSIS — R799 Abnormal finding of blood chemistry, unspecified: Secondary | ICD-10-CM | POA: Diagnosis not present

## 2013-11-13 DIAGNOSIS — Z792 Long term (current) use of antibiotics: Secondary | ICD-10-CM | POA: Diagnosis not present

## 2013-11-13 DIAGNOSIS — E1129 Type 2 diabetes mellitus with other diabetic kidney complication: Secondary | ICD-10-CM | POA: Diagnosis not present

## 2013-11-13 DIAGNOSIS — T8610 Unspecified complication of kidney transplant: Secondary | ICD-10-CM | POA: Diagnosis not present

## 2013-11-13 DIAGNOSIS — E1149 Type 2 diabetes mellitus with other diabetic neurological complication: Secondary | ICD-10-CM | POA: Diagnosis not present

## 2013-11-13 DIAGNOSIS — I4892 Unspecified atrial flutter: Secondary | ICD-10-CM | POA: Diagnosis not present

## 2013-11-13 DIAGNOSIS — E1142 Type 2 diabetes mellitus with diabetic polyneuropathy: Secondary | ICD-10-CM | POA: Diagnosis not present

## 2013-11-13 DIAGNOSIS — Z794 Long term (current) use of insulin: Secondary | ICD-10-CM | POA: Diagnosis not present

## 2013-11-13 DIAGNOSIS — E11319 Type 2 diabetes mellitus with unspecified diabetic retinopathy without macular edema: Secondary | ICD-10-CM | POA: Diagnosis not present

## 2013-11-13 DIAGNOSIS — Z940 Kidney transplant status: Secondary | ICD-10-CM | POA: Diagnosis not present

## 2013-11-13 DIAGNOSIS — E1139 Type 2 diabetes mellitus with other diabetic ophthalmic complication: Secondary | ICD-10-CM | POA: Diagnosis not present

## 2013-11-13 DIAGNOSIS — R944 Abnormal results of kidney function studies: Secondary | ICD-10-CM | POA: Diagnosis not present

## 2013-11-13 DIAGNOSIS — E1169 Type 2 diabetes mellitus with other specified complication: Secondary | ICD-10-CM | POA: Diagnosis not present

## 2013-11-13 DIAGNOSIS — Z885 Allergy status to narcotic agent status: Secondary | ICD-10-CM | POA: Diagnosis not present

## 2013-11-13 DIAGNOSIS — Z79899 Other long term (current) drug therapy: Secondary | ICD-10-CM | POA: Diagnosis not present

## 2013-11-19 DIAGNOSIS — Z7901 Long term (current) use of anticoagulants: Secondary | ICD-10-CM | POA: Diagnosis not present

## 2013-11-19 DIAGNOSIS — Z940 Kidney transplant status: Secondary | ICD-10-CM | POA: Diagnosis not present

## 2013-11-19 DIAGNOSIS — D899 Disorder involving the immune mechanism, unspecified: Secondary | ICD-10-CM | POA: Diagnosis not present

## 2013-11-19 DIAGNOSIS — I10 Essential (primary) hypertension: Secondary | ICD-10-CM | POA: Diagnosis not present

## 2013-11-19 DIAGNOSIS — I4892 Unspecified atrial flutter: Secondary | ICD-10-CM | POA: Diagnosis not present

## 2013-11-19 DIAGNOSIS — E1129 Type 2 diabetes mellitus with other diabetic kidney complication: Secondary | ICD-10-CM | POA: Diagnosis not present

## 2013-11-19 DIAGNOSIS — Z48298 Encounter for aftercare following other organ transplant: Secondary | ICD-10-CM | POA: Diagnosis not present

## 2013-11-19 DIAGNOSIS — Z79899 Other long term (current) drug therapy: Secondary | ICD-10-CM | POA: Diagnosis not present

## 2013-11-19 DIAGNOSIS — Z794 Long term (current) use of insulin: Secondary | ICD-10-CM | POA: Diagnosis not present

## 2013-12-06 DIAGNOSIS — E1129 Type 2 diabetes mellitus with other diabetic kidney complication: Secondary | ICD-10-CM | POA: Diagnosis not present

## 2013-12-06 DIAGNOSIS — D8989 Other specified disorders involving the immune mechanism, not elsewhere classified: Secondary | ICD-10-CM | POA: Diagnosis not present

## 2013-12-06 DIAGNOSIS — E872 Acidosis, unspecified: Secondary | ICD-10-CM | POA: Diagnosis not present

## 2013-12-06 DIAGNOSIS — I10 Essential (primary) hypertension: Secondary | ICD-10-CM | POA: Diagnosis not present

## 2013-12-06 DIAGNOSIS — Z48298 Encounter for aftercare following other organ transplant: Secondary | ICD-10-CM | POA: Diagnosis not present

## 2013-12-06 DIAGNOSIS — N186 End stage renal disease: Secondary | ICD-10-CM | POA: Diagnosis not present

## 2013-12-06 DIAGNOSIS — D899 Disorder involving the immune mechanism, unspecified: Secondary | ICD-10-CM | POA: Diagnosis not present

## 2013-12-06 DIAGNOSIS — Z940 Kidney transplant status: Secondary | ICD-10-CM | POA: Diagnosis not present

## 2013-12-06 DIAGNOSIS — E119 Type 2 diabetes mellitus without complications: Secondary | ICD-10-CM | POA: Diagnosis not present

## 2013-12-06 DIAGNOSIS — I120 Hypertensive chronic kidney disease with stage 5 chronic kidney disease or end stage renal disease: Secondary | ICD-10-CM | POA: Diagnosis not present

## 2013-12-06 DIAGNOSIS — Z794 Long term (current) use of insulin: Secondary | ICD-10-CM | POA: Diagnosis not present

## 2013-12-14 DIAGNOSIS — Z79899 Other long term (current) drug therapy: Secondary | ICD-10-CM | POA: Diagnosis not present

## 2013-12-18 DIAGNOSIS — I4892 Unspecified atrial flutter: Secondary | ICD-10-CM | POA: Diagnosis not present

## 2013-12-18 DIAGNOSIS — I10 Essential (primary) hypertension: Secondary | ICD-10-CM | POA: Diagnosis not present

## 2013-12-18 DIAGNOSIS — M25569 Pain in unspecified knee: Secondary | ICD-10-CM | POA: Diagnosis not present

## 2013-12-18 DIAGNOSIS — Z940 Kidney transplant status: Secondary | ICD-10-CM | POA: Diagnosis not present

## 2013-12-19 DIAGNOSIS — I10 Essential (primary) hypertension: Secondary | ICD-10-CM | POA: Diagnosis not present

## 2013-12-19 DIAGNOSIS — E1049 Type 1 diabetes mellitus with other diabetic neurological complication: Secondary | ICD-10-CM | POA: Diagnosis not present

## 2013-12-19 DIAGNOSIS — E1129 Type 2 diabetes mellitus with other diabetic kidney complication: Secondary | ICD-10-CM | POA: Diagnosis not present

## 2013-12-19 DIAGNOSIS — N186 End stage renal disease: Secondary | ICD-10-CM | POA: Diagnosis not present

## 2013-12-24 DIAGNOSIS — M25469 Effusion, unspecified knee: Secondary | ICD-10-CM | POA: Diagnosis not present

## 2013-12-24 DIAGNOSIS — A499 Bacterial infection, unspecified: Secondary | ICD-10-CM | POA: Diagnosis not present

## 2013-12-24 DIAGNOSIS — B9689 Other specified bacterial agents as the cause of diseases classified elsewhere: Secondary | ICD-10-CM | POA: Diagnosis not present

## 2013-12-24 DIAGNOSIS — M25569 Pain in unspecified knee: Secondary | ICD-10-CM | POA: Diagnosis not present

## 2014-01-01 DIAGNOSIS — E109 Type 1 diabetes mellitus without complications: Secondary | ICD-10-CM | POA: Diagnosis not present

## 2014-01-01 DIAGNOSIS — Z961 Presence of intraocular lens: Secondary | ICD-10-CM | POA: Diagnosis not present

## 2014-01-03 DIAGNOSIS — M25569 Pain in unspecified knee: Secondary | ICD-10-CM | POA: Diagnosis not present

## 2014-01-11 DIAGNOSIS — E119 Type 2 diabetes mellitus without complications: Secondary | ICD-10-CM | POA: Diagnosis not present

## 2014-01-11 DIAGNOSIS — Z79899 Other long term (current) drug therapy: Secondary | ICD-10-CM | POA: Diagnosis not present

## 2014-01-11 DIAGNOSIS — Z940 Kidney transplant status: Secondary | ICD-10-CM | POA: Diagnosis not present

## 2014-01-11 DIAGNOSIS — E785 Hyperlipidemia, unspecified: Secondary | ICD-10-CM | POA: Diagnosis not present

## 2014-01-21 DIAGNOSIS — I10 Essential (primary) hypertension: Secondary | ICD-10-CM | POA: Diagnosis not present

## 2014-01-21 DIAGNOSIS — Z23 Encounter for immunization: Secondary | ICD-10-CM | POA: Diagnosis not present

## 2014-01-21 DIAGNOSIS — I4892 Unspecified atrial flutter: Secondary | ICD-10-CM | POA: Diagnosis not present

## 2014-01-21 DIAGNOSIS — E119 Type 2 diabetes mellitus without complications: Secondary | ICD-10-CM | POA: Diagnosis not present

## 2014-01-21 DIAGNOSIS — Z940 Kidney transplant status: Secondary | ICD-10-CM | POA: Diagnosis not present

## 2014-01-30 DIAGNOSIS — E1049 Type 1 diabetes mellitus with other diabetic neurological complication: Secondary | ICD-10-CM | POA: Diagnosis not present

## 2014-01-30 DIAGNOSIS — I10 Essential (primary) hypertension: Secondary | ICD-10-CM | POA: Diagnosis not present

## 2014-01-30 DIAGNOSIS — E1129 Type 2 diabetes mellitus with other diabetic kidney complication: Secondary | ICD-10-CM | POA: Diagnosis not present

## 2014-01-30 DIAGNOSIS — N186 End stage renal disease: Secondary | ICD-10-CM | POA: Diagnosis not present

## 2014-01-30 DIAGNOSIS — E1165 Type 2 diabetes mellitus with hyperglycemia: Secondary | ICD-10-CM | POA: Diagnosis not present

## 2014-02-11 DIAGNOSIS — Z79899 Other long term (current) drug therapy: Secondary | ICD-10-CM | POA: Diagnosis not present

## 2014-02-11 DIAGNOSIS — Z940 Kidney transplant status: Secondary | ICD-10-CM | POA: Diagnosis not present

## 2014-02-13 DIAGNOSIS — Z940 Kidney transplant status: Secondary | ICD-10-CM | POA: Diagnosis not present

## 2014-02-13 DIAGNOSIS — E119 Type 2 diabetes mellitus without complications: Secondary | ICD-10-CM | POA: Diagnosis not present

## 2014-02-13 DIAGNOSIS — I10 Essential (primary) hypertension: Secondary | ICD-10-CM | POA: Diagnosis not present

## 2014-02-13 DIAGNOSIS — I4892 Unspecified atrial flutter: Secondary | ICD-10-CM | POA: Diagnosis not present

## 2014-02-27 DIAGNOSIS — E119 Type 2 diabetes mellitus without complications: Secondary | ICD-10-CM | POA: Diagnosis not present

## 2014-02-27 DIAGNOSIS — N186 End stage renal disease: Secondary | ICD-10-CM | POA: Diagnosis not present

## 2014-02-27 DIAGNOSIS — Z96652 Presence of left artificial knee joint: Secondary | ICD-10-CM | POA: Diagnosis not present

## 2014-02-27 DIAGNOSIS — Z940 Kidney transplant status: Secondary | ICD-10-CM | POA: Diagnosis not present

## 2014-02-27 DIAGNOSIS — Z471 Aftercare following joint replacement surgery: Secondary | ICD-10-CM | POA: Diagnosis not present

## 2014-02-27 DIAGNOSIS — Z89611 Acquired absence of right leg above knee: Secondary | ICD-10-CM | POA: Diagnosis not present

## 2014-02-27 DIAGNOSIS — I120 Hypertensive chronic kidney disease with stage 5 chronic kidney disease or end stage renal disease: Secondary | ICD-10-CM | POA: Diagnosis not present

## 2014-03-18 DIAGNOSIS — Z79899 Other long term (current) drug therapy: Secondary | ICD-10-CM | POA: Diagnosis not present

## 2014-03-18 DIAGNOSIS — Z940 Kidney transplant status: Secondary | ICD-10-CM | POA: Diagnosis not present

## 2014-03-25 DIAGNOSIS — Z79899 Other long term (current) drug therapy: Secondary | ICD-10-CM | POA: Diagnosis not present

## 2014-03-26 DIAGNOSIS — N2889 Other specified disorders of kidney and ureter: Secondary | ICD-10-CM | POA: Diagnosis not present

## 2014-03-26 DIAGNOSIS — I151 Hypertension secondary to other renal disorders: Secondary | ICD-10-CM | POA: Diagnosis not present

## 2014-03-26 DIAGNOSIS — Z940 Kidney transplant status: Secondary | ICD-10-CM | POA: Diagnosis not present

## 2014-03-26 DIAGNOSIS — Z79899 Other long term (current) drug therapy: Secondary | ICD-10-CM | POA: Diagnosis not present

## 2014-03-29 DIAGNOSIS — E1040 Type 1 diabetes mellitus with diabetic neuropathy, unspecified: Secondary | ICD-10-CM | POA: Diagnosis not present

## 2014-03-29 DIAGNOSIS — M179 Osteoarthritis of knee, unspecified: Secondary | ICD-10-CM | POA: Diagnosis not present

## 2014-03-29 DIAGNOSIS — N186 End stage renal disease: Secondary | ICD-10-CM | POA: Diagnosis not present

## 2014-03-29 DIAGNOSIS — E1121 Type 2 diabetes mellitus with diabetic nephropathy: Secondary | ICD-10-CM | POA: Diagnosis not present

## 2014-03-29 DIAGNOSIS — I10 Essential (primary) hypertension: Secondary | ICD-10-CM | POA: Diagnosis not present

## 2014-04-06 ENCOUNTER — Emergency Department (HOSPITAL_COMMUNITY): Payer: Medicare Other

## 2014-04-06 ENCOUNTER — Encounter (HOSPITAL_COMMUNITY): Payer: Self-pay | Admitting: Emergency Medicine

## 2014-04-06 ENCOUNTER — Emergency Department (HOSPITAL_COMMUNITY)
Admission: EM | Admit: 2014-04-06 | Discharge: 2014-04-06 | Disposition: A | Discharge status: Home / Self Care | Payer: Medicare Other | Attending: Emergency Medicine | Admitting: Emergency Medicine

## 2014-04-06 DIAGNOSIS — R079 Chest pain, unspecified: Secondary | ICD-10-CM | POA: Insufficient documentation

## 2014-04-06 DIAGNOSIS — Y998 Other external cause status: Secondary | ICD-10-CM | POA: Diagnosis not present

## 2014-04-06 DIAGNOSIS — I10 Essential (primary) hypertension: Secondary | ICD-10-CM | POA: Diagnosis not present

## 2014-04-06 DIAGNOSIS — M545 Low back pain: Secondary | ICD-10-CM | POA: Diagnosis not present

## 2014-04-06 DIAGNOSIS — E119 Type 2 diabetes mellitus without complications: Secondary | ICD-10-CM | POA: Insufficient documentation

## 2014-04-06 DIAGNOSIS — M546 Pain in thoracic spine: Secondary | ICD-10-CM | POA: Diagnosis not present

## 2014-04-06 DIAGNOSIS — S199XXA Unspecified injury of neck, initial encounter: Secondary | ICD-10-CM | POA: Insufficient documentation

## 2014-04-06 DIAGNOSIS — Y9389 Activity, other specified: Secondary | ICD-10-CM | POA: Diagnosis not present

## 2014-04-06 DIAGNOSIS — S0990XA Unspecified injury of head, initial encounter: Secondary | ICD-10-CM | POA: Insufficient documentation

## 2014-04-06 DIAGNOSIS — Z940 Kidney transplant status: Secondary | ICD-10-CM | POA: Diagnosis not present

## 2014-04-06 DIAGNOSIS — N19 Unspecified kidney failure: Secondary | ICD-10-CM | POA: Diagnosis not present

## 2014-04-06 DIAGNOSIS — S3992XA Unspecified injury of lower back, initial encounter: Secondary | ICD-10-CM | POA: Diagnosis not present

## 2014-04-06 DIAGNOSIS — S29002A Unspecified injury of muscle and tendon of back wall of thorax, initial encounter: Secondary | ICD-10-CM | POA: Diagnosis not present

## 2014-04-06 DIAGNOSIS — K219 Gastro-esophageal reflux disease without esophagitis: Secondary | ICD-10-CM | POA: Diagnosis not present

## 2014-04-06 DIAGNOSIS — Z79899 Other long term (current) drug therapy: Secondary | ICD-10-CM | POA: Diagnosis not present

## 2014-04-06 DIAGNOSIS — S3993XA Unspecified injury of pelvis, initial encounter: Secondary | ICD-10-CM | POA: Diagnosis not present

## 2014-04-06 DIAGNOSIS — Z87828 Personal history of other (healed) physical injury and trauma: Secondary | ICD-10-CM | POA: Insufficient documentation

## 2014-04-06 DIAGNOSIS — S299XXA Unspecified injury of thorax, initial encounter: Secondary | ICD-10-CM | POA: Diagnosis not present

## 2014-04-06 DIAGNOSIS — I499 Cardiac arrhythmia, unspecified: Secondary | ICD-10-CM | POA: Insufficient documentation

## 2014-04-06 DIAGNOSIS — Z792 Long term (current) use of antibiotics: Secondary | ICD-10-CM | POA: Insufficient documentation

## 2014-04-06 DIAGNOSIS — I509 Heart failure, unspecified: Secondary | ICD-10-CM | POA: Diagnosis not present

## 2014-04-06 DIAGNOSIS — Y92009 Unspecified place in unspecified non-institutional (private) residence as the place of occurrence of the external cause: Secondary | ICD-10-CM | POA: Insufficient documentation

## 2014-04-06 DIAGNOSIS — Z794 Long term (current) use of insulin: Secondary | ICD-10-CM | POA: Insufficient documentation

## 2014-04-06 DIAGNOSIS — M542 Cervicalgia: Secondary | ICD-10-CM | POA: Diagnosis not present

## 2014-04-06 DIAGNOSIS — W208XXA Other cause of strike by thrown, projected or falling object, initial encounter: Secondary | ICD-10-CM | POA: Diagnosis not present

## 2014-04-06 DIAGNOSIS — Z79891 Long term (current) use of opiate analgesic: Secondary | ICD-10-CM | POA: Insufficient documentation

## 2014-04-06 DIAGNOSIS — S29001A Unspecified injury of muscle and tendon of front wall of thorax, initial encounter: Secondary | ICD-10-CM | POA: Diagnosis not present

## 2014-04-06 DIAGNOSIS — S3991XA Unspecified injury of abdomen, initial encounter: Secondary | ICD-10-CM | POA: Diagnosis not present

## 2014-04-06 DIAGNOSIS — T1490XA Injury, unspecified, initial encounter: Secondary | ICD-10-CM

## 2014-04-06 DIAGNOSIS — T148 Other injury of unspecified body region: Secondary | ICD-10-CM | POA: Diagnosis not present

## 2014-04-06 DIAGNOSIS — R52 Pain, unspecified: Secondary | ICD-10-CM | POA: Diagnosis not present

## 2014-04-06 DIAGNOSIS — R109 Unspecified abdominal pain: Secondary | ICD-10-CM | POA: Diagnosis not present

## 2014-04-06 LAB — COMPREHENSIVE METABOLIC PANEL
ALT: 17 U/L (ref 0–53)
AST: 23 U/L (ref 0–37)
Albumin: 3.6 g/dL (ref 3.5–5.2)
Alkaline Phosphatase: 109 U/L (ref 39–117)
Anion gap: 13 (ref 5–15)
BUN: 21 mg/dL (ref 6–23)
CO2: 22 mEq/L (ref 19–32)
Calcium: 9.5 mg/dL (ref 8.4–10.5)
Chloride: 100 mEq/L (ref 96–112)
Creatinine, Ser: 1.91 mg/dL — ABNORMAL HIGH (ref 0.50–1.35)
GFR calc Af Amer: 43 mL/min — ABNORMAL LOW (ref >90)
GFR calc non Af Amer: 37 mL/min — ABNORMAL LOW (ref >90)
Glucose, Bld: 119 mg/dL — ABNORMAL HIGH (ref 70–99)
Potassium: 4.1 mEq/L (ref 3.7–5.3)
Sodium: 135 mEq/L — ABNORMAL LOW (ref 137–147)
Total Bilirubin: 0.4 mg/dL (ref 0.3–1.2)
Total Protein: 7.1 g/dL (ref 6.0–8.3)

## 2014-04-06 LAB — I-STAT CHEM 8, ED
BUN: 25 mg/dL — ABNORMAL HIGH (ref 6–23)
Calcium, Ion: 1.12 mmol/L (ref 1.12–1.23)
Chloride: 104 mEq/L (ref 96–112)
Creatinine, Ser: 2 mg/dL — ABNORMAL HIGH (ref 0.50–1.35)
Glucose, Bld: 125 mg/dL — ABNORMAL HIGH (ref 70–99)
HCT: 48 % (ref 39.0–52.0)
Hemoglobin: 16.3 g/dL (ref 13.0–17.0)
Potassium: 3.9 mEq/L (ref 3.7–5.3)
Sodium: 135 mEq/L — ABNORMAL LOW (ref 137–147)
TCO2: 21 mmol/L (ref 0–100)

## 2014-04-06 LAB — URINALYSIS, ROUTINE W REFLEX MICROSCOPIC
Bilirubin Urine: NEGATIVE
Glucose, UA: NEGATIVE mg/dL
Ketones, ur: NEGATIVE mg/dL
Leukocytes, UA: NEGATIVE
Nitrite: NEGATIVE
Protein, ur: 30 mg/dL — AB
Specific Gravity, Urine: 1.01 (ref 1.005–1.030)
Urobilinogen, UA: 0.2 mg/dL (ref 0.0–1.0)
pH: 6 (ref 5.0–8.0)

## 2014-04-06 LAB — CBC WITH DIFFERENTIAL/PLATELET
Basophils Absolute: 0 10*3/uL (ref 0.0–0.1)
Basophils Relative: 0 % (ref 0–1)
Eosinophils Absolute: 0.2 10*3/uL (ref 0.0–0.7)
Eosinophils Relative: 3 % (ref 0–5)
HCT: 43.5 % (ref 39.0–52.0)
Hemoglobin: 14 g/dL (ref 13.0–17.0)
Lymphocytes Relative: 16 % (ref 12–46)
Lymphs Abs: 0.8 10*3/uL (ref 0.7–4.0)
MCH: 26.2 pg (ref 26.0–34.0)
MCHC: 32.2 g/dL (ref 30.0–36.0)
MCV: 81.3 fL (ref 78.0–100.0)
Monocytes Absolute: 0.6 10*3/uL (ref 0.1–1.0)
Monocytes Relative: 12 % (ref 3–12)
Neutro Abs: 3.6 10*3/uL (ref 1.7–7.7)
Neutrophils Relative %: 69 % (ref 43–77)
Platelets: 191 10*3/uL (ref 150–400)
RBC: 5.35 MIL/uL (ref 4.22–5.81)
RDW: 14.3 % (ref 11.5–15.5)
WBC: 5.2 10*3/uL (ref 4.0–10.5)

## 2014-04-06 LAB — LIPASE, BLOOD: Lipase: 21 U/L (ref 11–59)

## 2014-04-06 LAB — URINE MICROSCOPIC-ADD ON

## 2014-04-06 LAB — TROPONIN I: Troponin I: <0.3 ng/mL (ref <0.30)

## 2014-04-06 LAB — I-STAT CG4 LACTIC ACID, ED: Lactic Acid, Venous: 1.8 mmol/L (ref 0.5–2.2)

## 2014-04-06 MED ORDER — CYCLOBENZAPRINE HCL 10 MG PO TABS: 10.0000 mg | ORAL_TABLET | Freq: Three times a day (TID) | ORAL | Status: DC | PRN

## 2014-04-06 MED ORDER — ONDANSETRON HCL 4 MG/2ML IJ SOLN
4.0000 mg | Freq: Once | INTRAMUSCULAR | Status: AC
  Administered 2014-04-06: 4 mg via INTRAVENOUS
  Filled 2014-04-06: qty 2

## 2014-04-06 MED ORDER — HYDROMORPHONE HCL 1 MG/ML IJ SOLN
1.0000 mg | Freq: Once | INTRAMUSCULAR | Status: AC
  Administered 2014-04-06: 1 mg via INTRAVENOUS
  Filled 2014-04-06: qty 1
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
## 2014-04-06 NOTE — ED Notes (Signed)
Pt arrived via [facility] on LSB and c-collar intact; Myself and [place], RN assisted Pollina, MD with removing pt from LSB; pt then placed on monitor, continuous pulse oximetry and blood pressure cuff; c-collar still intact

## 2014-04-06 NOTE — ED Notes (Signed)
Pt handed an urinal to use; aware that an urine specimen is needed

## 2014-04-06 NOTE — ED Notes (Signed)
Pt has returned from being out of the department; pt placed back on monitor, continuous pulse oximetry and blood pressure cuff; pt handed an urinal to use again

## 2014-04-06 NOTE — ED Notes (Signed)
Patient transported to CT 

## 2014-04-06 NOTE — ED Notes (Signed)
Pt was at home working under a sink when the sink fell on him , pt is c/o low back pain , left knee pain , and chest /neck pain , pt is on blood thinners
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
## 2014-04-10 DIAGNOSIS — S300XXA Contusion of lower back and pelvis, initial encounter: Secondary | ICD-10-CM | POA: Diagnosis not present

## 2014-04-10 DIAGNOSIS — M5136 Other intervertebral disc degeneration, lumbar region: Secondary | ICD-10-CM | POA: Diagnosis not present

## 2014-04-10 DIAGNOSIS — E1121 Type 2 diabetes mellitus with diabetic nephropathy: Secondary | ICD-10-CM | POA: Diagnosis not present

## 2014-04-12 DIAGNOSIS — Z940 Kidney transplant status: Secondary | ICD-10-CM | POA: Diagnosis not present

## 2014-04-12 DIAGNOSIS — E785 Hyperlipidemia, unspecified: Secondary | ICD-10-CM | POA: Diagnosis not present

## 2014-04-12 DIAGNOSIS — E119 Type 2 diabetes mellitus without complications: Secondary | ICD-10-CM | POA: Diagnosis not present

## 2014-04-12 DIAGNOSIS — Z79899 Other long term (current) drug therapy: Secondary | ICD-10-CM | POA: Diagnosis not present

## 2014-04-22 DIAGNOSIS — Z79899 Other long term (current) drug therapy: Secondary | ICD-10-CM | POA: Diagnosis not present

## 2014-04-22 DIAGNOSIS — Z940 Kidney transplant status: Secondary | ICD-10-CM | POA: Diagnosis not present

## 2014-04-22 DIAGNOSIS — N2889 Other specified disorders of kidney and ureter: Secondary | ICD-10-CM | POA: Diagnosis not present

## 2014-04-22 DIAGNOSIS — I151 Hypertension secondary to other renal disorders: Secondary | ICD-10-CM | POA: Diagnosis not present

## 2014-04-24 DIAGNOSIS — M179 Osteoarthritis of knee, unspecified: Secondary | ICD-10-CM | POA: Diagnosis not present

## 2014-04-24 DIAGNOSIS — I10 Essential (primary) hypertension: Secondary | ICD-10-CM | POA: Diagnosis not present

## 2014-04-24 DIAGNOSIS — S7010XA Contusion of unspecified thigh, initial encounter: Secondary | ICD-10-CM | POA: Diagnosis not present

## 2014-04-24 DIAGNOSIS — E1121 Type 2 diabetes mellitus with diabetic nephropathy: Secondary | ICD-10-CM | POA: Diagnosis not present

## 2014-04-30 DIAGNOSIS — Z471 Aftercare following joint replacement surgery: Secondary | ICD-10-CM | POA: Diagnosis not present

## 2014-04-30 DIAGNOSIS — Z96652 Presence of left artificial knee joint: Secondary | ICD-10-CM | POA: Diagnosis not present

## 2014-04-30 DIAGNOSIS — M25662 Stiffness of left knee, not elsewhere classified: Secondary | ICD-10-CM | POA: Diagnosis not present

## 2014-04-30 DIAGNOSIS — M25562 Pain in left knee: Secondary | ICD-10-CM | POA: Diagnosis not present

## 2014-05-13 DIAGNOSIS — Z006 Encounter for examination for normal comparison and control in clinical research program: Secondary | ICD-10-CM | POA: Diagnosis not present

## 2014-05-13 DIAGNOSIS — E119 Type 2 diabetes mellitus without complications: Secondary | ICD-10-CM | POA: Diagnosis not present

## 2014-05-13 DIAGNOSIS — Z79899 Other long term (current) drug therapy: Secondary | ICD-10-CM | POA: Diagnosis not present

## 2014-05-13 DIAGNOSIS — D899 Disorder involving the immune mechanism, unspecified: Secondary | ICD-10-CM | POA: Diagnosis not present

## 2014-05-13 DIAGNOSIS — Z7901 Long term (current) use of anticoagulants: Secondary | ICD-10-CM | POA: Diagnosis not present

## 2014-05-13 DIAGNOSIS — Z4822 Encounter for aftercare following kidney transplant: Secondary | ICD-10-CM | POA: Diagnosis not present

## 2014-05-13 DIAGNOSIS — Z792 Long term (current) use of antibiotics: Secondary | ICD-10-CM | POA: Diagnosis not present

## 2014-05-13 DIAGNOSIS — Z79891 Long term (current) use of opiate analgesic: Secondary | ICD-10-CM | POA: Diagnosis not present

## 2014-05-13 DIAGNOSIS — E785 Hyperlipidemia, unspecified: Secondary | ICD-10-CM | POA: Diagnosis not present

## 2014-05-13 DIAGNOSIS — I10 Essential (primary) hypertension: Secondary | ICD-10-CM | POA: Diagnosis not present

## 2014-05-13 DIAGNOSIS — Z940 Kidney transplant status: Secondary | ICD-10-CM | POA: Diagnosis not present

## 2014-05-13 DIAGNOSIS — Z7982 Long term (current) use of aspirin: Secondary | ICD-10-CM | POA: Diagnosis not present

## 2014-05-13 DIAGNOSIS — Z794 Long term (current) use of insulin: Secondary | ICD-10-CM | POA: Diagnosis not present

## 2014-05-14 DIAGNOSIS — I2510 Atherosclerotic heart disease of native coronary artery without angina pectoris: Secondary | ICD-10-CM | POA: Insufficient documentation

## 2014-05-14 DIAGNOSIS — Z96652 Presence of left artificial knee joint: Secondary | ICD-10-CM | POA: Diagnosis not present

## 2014-05-14 DIAGNOSIS — E1121 Type 2 diabetes mellitus with diabetic nephropathy: Secondary | ICD-10-CM | POA: Diagnosis not present

## 2014-05-14 DIAGNOSIS — I483 Typical atrial flutter: Secondary | ICD-10-CM | POA: Diagnosis not present

## 2014-05-14 DIAGNOSIS — Z940 Kidney transplant status: Secondary | ICD-10-CM | POA: Diagnosis not present

## 2014-05-14 DIAGNOSIS — I10 Essential (primary) hypertension: Secondary | ICD-10-CM | POA: Diagnosis not present

## 2014-05-14 DIAGNOSIS — I340 Nonrheumatic mitral (valve) insufficiency: Secondary | ICD-10-CM | POA: Insufficient documentation

## 2014-05-24 DIAGNOSIS — Z940 Kidney transplant status: Secondary | ICD-10-CM | POA: Diagnosis not present

## 2014-06-05 DIAGNOSIS — M179 Osteoarthritis of knee, unspecified: Secondary | ICD-10-CM | POA: Diagnosis not present

## 2014-06-05 DIAGNOSIS — M5136 Other intervertebral disc degeneration, lumbar region: Secondary | ICD-10-CM | POA: Diagnosis not present

## 2014-06-05 DIAGNOSIS — I10 Essential (primary) hypertension: Secondary | ICD-10-CM | POA: Diagnosis not present

## 2014-06-05 DIAGNOSIS — E1121 Type 2 diabetes mellitus with diabetic nephropathy: Secondary | ICD-10-CM | POA: Diagnosis not present

## 2014-06-17 DIAGNOSIS — Z79899 Other long term (current) drug therapy: Secondary | ICD-10-CM | POA: Diagnosis not present

## 2014-06-17 DIAGNOSIS — Z940 Kidney transplant status: Secondary | ICD-10-CM | POA: Diagnosis not present

## 2014-06-18 DIAGNOSIS — I151 Hypertension secondary to other renal disorders: Secondary | ICD-10-CM | POA: Diagnosis not present

## 2014-06-18 DIAGNOSIS — Z79899 Other long term (current) drug therapy: Secondary | ICD-10-CM | POA: Diagnosis not present

## 2014-06-18 DIAGNOSIS — Z940 Kidney transplant status: Secondary | ICD-10-CM | POA: Diagnosis not present

## 2014-06-18 DIAGNOSIS — N2889 Other specified disorders of kidney and ureter: Secondary | ICD-10-CM | POA: Diagnosis not present

## 2014-06-24 DIAGNOSIS — Z96652 Presence of left artificial knee joint: Secondary | ICD-10-CM | POA: Diagnosis not present

## 2014-06-24 DIAGNOSIS — Z940 Kidney transplant status: Secondary | ICD-10-CM | POA: Diagnosis not present

## 2014-06-24 DIAGNOSIS — M25562 Pain in left knee: Secondary | ICD-10-CM | POA: Diagnosis not present

## 2014-06-24 DIAGNOSIS — T84093D Other mechanical complication of internal left knee prosthesis, subsequent encounter: Secondary | ICD-10-CM | POA: Diagnosis not present

## 2014-06-24 DIAGNOSIS — T84098D Other mechanical complication of other internal joint prosthesis, subsequent encounter: Secondary | ICD-10-CM | POA: Diagnosis not present

## 2014-06-25 DIAGNOSIS — I10 Essential (primary) hypertension: Secondary | ICD-10-CM | POA: Diagnosis not present

## 2014-06-25 DIAGNOSIS — E119 Type 2 diabetes mellitus without complications: Secondary | ICD-10-CM | POA: Diagnosis not present

## 2014-06-25 DIAGNOSIS — I4892 Unspecified atrial flutter: Secondary | ICD-10-CM | POA: Diagnosis not present

## 2014-06-25 DIAGNOSIS — Z940 Kidney transplant status: Secondary | ICD-10-CM | POA: Diagnosis not present

## 2014-06-25 DIAGNOSIS — Z96652 Presence of left artificial knee joint: Secondary | ICD-10-CM | POA: Diagnosis not present

## 2014-06-25 DIAGNOSIS — Z794 Long term (current) use of insulin: Secondary | ICD-10-CM | POA: Diagnosis not present

## 2014-06-25 DIAGNOSIS — I2510 Atherosclerotic heart disease of native coronary artery without angina pectoris: Secondary | ICD-10-CM | POA: Diagnosis not present

## 2014-06-28 DIAGNOSIS — N186 End stage renal disease: Secondary | ICD-10-CM | POA: Diagnosis not present

## 2014-06-28 DIAGNOSIS — M5136 Other intervertebral disc degeneration, lumbar region: Secondary | ICD-10-CM | POA: Diagnosis not present

## 2014-06-28 DIAGNOSIS — M179 Osteoarthritis of knee, unspecified: Secondary | ICD-10-CM | POA: Diagnosis not present

## 2014-06-28 DIAGNOSIS — I10 Essential (primary) hypertension: Secondary | ICD-10-CM | POA: Diagnosis not present

## 2014-06-28 DIAGNOSIS — E1165 Type 2 diabetes mellitus with hyperglycemia: Secondary | ICD-10-CM | POA: Diagnosis not present

## 2014-07-02 DIAGNOSIS — T84098D Other mechanical complication of other internal joint prosthesis, subsequent encounter: Secondary | ICD-10-CM | POA: Diagnosis not present

## 2014-07-05 DIAGNOSIS — Z792 Long term (current) use of antibiotics: Secondary | ICD-10-CM | POA: Diagnosis not present

## 2014-07-05 DIAGNOSIS — I440 Atrioventricular block, first degree: Secondary | ICD-10-CM | POA: Diagnosis not present

## 2014-07-05 DIAGNOSIS — I2510 Atherosclerotic heart disease of native coronary artery without angina pectoris: Secondary | ICD-10-CM | POA: Diagnosis present

## 2014-07-05 DIAGNOSIS — E785 Hyperlipidemia, unspecified: Secondary | ICD-10-CM | POA: Diagnosis not present

## 2014-07-05 DIAGNOSIS — Z8619 Personal history of other infectious and parasitic diseases: Secondary | ICD-10-CM | POA: Diagnosis not present

## 2014-07-05 DIAGNOSIS — E1122 Type 2 diabetes mellitus with diabetic chronic kidney disease: Secondary | ICD-10-CM | POA: Diagnosis present

## 2014-07-05 DIAGNOSIS — M79605 Pain in left leg: Secondary | ICD-10-CM | POA: Diagnosis not present

## 2014-07-05 DIAGNOSIS — Z886 Allergy status to analgesic agent status: Secondary | ICD-10-CM | POA: Diagnosis not present

## 2014-07-05 DIAGNOSIS — Z96651 Presence of right artificial knee joint: Secondary | ICD-10-CM | POA: Diagnosis not present

## 2014-07-05 DIAGNOSIS — B957 Other staphylococcus as the cause of diseases classified elsewhere: Secondary | ICD-10-CM | POA: Diagnosis not present

## 2014-07-05 DIAGNOSIS — Z794 Long term (current) use of insulin: Secondary | ICD-10-CM | POA: Diagnosis not present

## 2014-07-05 DIAGNOSIS — G8918 Other acute postprocedural pain: Secondary | ICD-10-CM | POA: Diagnosis not present

## 2014-07-05 DIAGNOSIS — T84093A Other mechanical complication of internal left knee prosthesis, initial encounter: Secondary | ICD-10-CM | POA: Diagnosis not present

## 2014-07-05 DIAGNOSIS — T8453XA Infection and inflammatory reaction due to internal right knee prosthesis, initial encounter: Secondary | ICD-10-CM | POA: Diagnosis not present

## 2014-07-05 DIAGNOSIS — T50905A Adverse effect of unspecified drugs, medicaments and biological substances, initial encounter: Secondary | ICD-10-CM | POA: Diagnosis not present

## 2014-07-05 DIAGNOSIS — Z96652 Presence of left artificial knee joint: Secondary | ICD-10-CM | POA: Diagnosis not present

## 2014-07-05 DIAGNOSIS — N186 End stage renal disease: Secondary | ICD-10-CM | POA: Diagnosis not present

## 2014-07-05 DIAGNOSIS — Z940 Kidney transplant status: Secondary | ICD-10-CM | POA: Diagnosis not present

## 2014-07-05 DIAGNOSIS — Z472 Encounter for removal of internal fixation device: Secondary | ICD-10-CM | POA: Diagnosis not present

## 2014-07-05 DIAGNOSIS — I4891 Unspecified atrial fibrillation: Secondary | ICD-10-CM | POA: Diagnosis present

## 2014-07-05 DIAGNOSIS — M8618 Other acute osteomyelitis, other site: Secondary | ICD-10-CM | POA: Diagnosis not present

## 2014-07-05 DIAGNOSIS — D62 Acute posthemorrhagic anemia: Secondary | ICD-10-CM | POA: Diagnosis not present

## 2014-07-05 DIAGNOSIS — Z7901 Long term (current) use of anticoagulants: Secondary | ICD-10-CM | POA: Diagnosis not present

## 2014-07-05 DIAGNOSIS — M7989 Other specified soft tissue disorders: Secondary | ICD-10-CM | POA: Diagnosis not present

## 2014-07-05 DIAGNOSIS — E875 Hyperkalemia: Secondary | ICD-10-CM | POA: Diagnosis not present

## 2014-07-05 DIAGNOSIS — I120 Hypertensive chronic kidney disease with stage 5 chronic kidney disease or end stage renal disease: Secondary | ICD-10-CM | POA: Diagnosis not present

## 2014-07-05 DIAGNOSIS — T84498A Other mechanical complication of other internal orthopedic devices, implants and grafts, initial encounter: Secondary | ICD-10-CM | POA: Insufficient documentation

## 2014-07-05 DIAGNOSIS — E871 Hypo-osmolality and hyponatremia: Secondary | ICD-10-CM | POA: Diagnosis not present

## 2014-07-05 DIAGNOSIS — M00062 Staphylococcal arthritis, left knee: Secondary | ICD-10-CM | POA: Diagnosis present

## 2014-07-05 DIAGNOSIS — M545 Low back pain: Secondary | ICD-10-CM | POA: Diagnosis present

## 2014-07-05 DIAGNOSIS — T8584XA Pain due to internal prosthetic devices, implants and grafts, not elsewhere classified, initial encounter: Secondary | ICD-10-CM | POA: Diagnosis not present

## 2014-07-05 DIAGNOSIS — I129 Hypertensive chronic kidney disease with stage 1 through stage 4 chronic kidney disease, or unspecified chronic kidney disease: Secondary | ICD-10-CM | POA: Diagnosis present

## 2014-07-05 DIAGNOSIS — G8929 Other chronic pain: Secondary | ICD-10-CM | POA: Diagnosis present

## 2014-07-05 DIAGNOSIS — Z992 Dependence on renal dialysis: Secondary | ICD-10-CM | POA: Diagnosis not present

## 2014-07-05 DIAGNOSIS — E784 Other hyperlipidemia: Secondary | ICD-10-CM | POA: Diagnosis not present

## 2014-07-05 DIAGNOSIS — T847XXA Infection and inflammatory reaction due to other internal orthopedic prosthetic devices, implants and grafts, initial encounter: Secondary | ICD-10-CM | POA: Diagnosis not present

## 2014-07-05 DIAGNOSIS — N189 Chronic kidney disease, unspecified: Secondary | ICD-10-CM | POA: Diagnosis present

## 2014-07-05 DIAGNOSIS — M25562 Pain in left knee: Secondary | ICD-10-CM | POA: Diagnosis not present

## 2014-07-05 DIAGNOSIS — T84033A Mechanical loosening of internal left knee prosthetic joint, initial encounter: Secondary | ICD-10-CM | POA: Diagnosis present

## 2014-07-05 DIAGNOSIS — E119 Type 2 diabetes mellitus without complications: Secondary | ICD-10-CM | POA: Diagnosis not present

## 2014-07-05 DIAGNOSIS — Z471 Aftercare following joint replacement surgery: Secondary | ICD-10-CM | POA: Diagnosis not present

## 2014-07-11 ENCOUNTER — Ambulatory Visit: Payer: Medicare Other | Attending: Student | Admitting: Physical Therapy

## 2014-07-12 DIAGNOSIS — Z471 Aftercare following joint replacement surgery: Secondary | ICD-10-CM | POA: Diagnosis not present

## 2014-07-12 DIAGNOSIS — M8618 Other acute osteomyelitis, other site: Secondary | ICD-10-CM | POA: Diagnosis not present

## 2014-07-15 DIAGNOSIS — M8618 Other acute osteomyelitis, other site: Secondary | ICD-10-CM | POA: Diagnosis not present

## 2014-07-15 DIAGNOSIS — M89569 Osteolysis, unspecified lower leg: Secondary | ICD-10-CM | POA: Diagnosis not present

## 2014-07-15 DIAGNOSIS — Z471 Aftercare following joint replacement surgery: Secondary | ICD-10-CM | POA: Diagnosis not present

## 2014-07-17 DIAGNOSIS — Z471 Aftercare following joint replacement surgery: Secondary | ICD-10-CM | POA: Diagnosis not present

## 2014-07-17 DIAGNOSIS — M8618 Other acute osteomyelitis, other site: Secondary | ICD-10-CM | POA: Diagnosis not present

## 2014-07-19 DIAGNOSIS — Z471 Aftercare following joint replacement surgery: Secondary | ICD-10-CM | POA: Diagnosis not present

## 2014-07-19 DIAGNOSIS — M8618 Other acute osteomyelitis, other site: Secondary | ICD-10-CM | POA: Diagnosis not present

## 2014-07-22 DIAGNOSIS — M89569 Osteolysis, unspecified lower leg: Secondary | ICD-10-CM | POA: Diagnosis not present

## 2014-07-22 DIAGNOSIS — Z471 Aftercare following joint replacement surgery: Secondary | ICD-10-CM | POA: Diagnosis not present

## 2014-07-22 DIAGNOSIS — M8618 Other acute osteomyelitis, other site: Secondary | ICD-10-CM | POA: Diagnosis not present

## 2014-07-23 DIAGNOSIS — Z471 Aftercare following joint replacement surgery: Secondary | ICD-10-CM | POA: Diagnosis not present

## 2014-07-23 DIAGNOSIS — M8618 Other acute osteomyelitis, other site: Secondary | ICD-10-CM | POA: Diagnosis not present

## 2014-07-24 DIAGNOSIS — M8618 Other acute osteomyelitis, other site: Secondary | ICD-10-CM | POA: Diagnosis not present

## 2014-07-24 DIAGNOSIS — Z471 Aftercare following joint replacement surgery: Secondary | ICD-10-CM | POA: Diagnosis not present

## 2014-07-26 DIAGNOSIS — Z471 Aftercare following joint replacement surgery: Secondary | ICD-10-CM | POA: Diagnosis not present

## 2014-07-26 DIAGNOSIS — M8618 Other acute osteomyelitis, other site: Secondary | ICD-10-CM | POA: Diagnosis not present

## 2014-07-29 DIAGNOSIS — I120 Hypertensive chronic kidney disease with stage 5 chronic kidney disease or end stage renal disease: Secondary | ICD-10-CM | POA: Diagnosis not present

## 2014-07-29 DIAGNOSIS — Z4789 Encounter for other orthopedic aftercare: Secondary | ICD-10-CM | POA: Diagnosis not present

## 2014-07-29 DIAGNOSIS — E119 Type 2 diabetes mellitus without complications: Secondary | ICD-10-CM | POA: Diagnosis not present

## 2014-07-29 DIAGNOSIS — I2510 Atherosclerotic heart disease of native coronary artery without angina pectoris: Secondary | ICD-10-CM | POA: Diagnosis not present

## 2014-07-29 DIAGNOSIS — N186 End stage renal disease: Secondary | ICD-10-CM | POA: Diagnosis not present

## 2014-07-30 DIAGNOSIS — Z96652 Presence of left artificial knee joint: Secondary | ICD-10-CM | POA: Diagnosis not present

## 2014-07-30 DIAGNOSIS — M8618 Other acute osteomyelitis, other site: Secondary | ICD-10-CM | POA: Diagnosis not present

## 2014-07-30 DIAGNOSIS — Z471 Aftercare following joint replacement surgery: Secondary | ICD-10-CM | POA: Diagnosis not present

## 2014-07-30 NOTE — Patient Outreach (Signed)
This home visit was rescheduled for Thursday, August 08, 2014 at 12 noon

## 2014-07-31 ENCOUNTER — Other Ambulatory Visit: Payer: Medicare Other

## 2014-08-01 DIAGNOSIS — Z471 Aftercare following joint replacement surgery: Secondary | ICD-10-CM | POA: Diagnosis not present

## 2014-08-01 DIAGNOSIS — M8618 Other acute osteomyelitis, other site: Secondary | ICD-10-CM | POA: Diagnosis not present

## 2014-08-05 DIAGNOSIS — Z471 Aftercare following joint replacement surgery: Secondary | ICD-10-CM | POA: Diagnosis not present

## 2014-08-05 DIAGNOSIS — M8618 Other acute osteomyelitis, other site: Secondary | ICD-10-CM | POA: Diagnosis not present

## 2014-08-06 DIAGNOSIS — Z5181 Encounter for therapeutic drug level monitoring: Secondary | ICD-10-CM | POA: Diagnosis not present

## 2014-08-06 DIAGNOSIS — R748 Abnormal levels of other serum enzymes: Secondary | ICD-10-CM | POA: Diagnosis not present

## 2014-08-06 DIAGNOSIS — K130 Diseases of lips: Secondary | ICD-10-CM | POA: Diagnosis not present

## 2014-08-06 DIAGNOSIS — Z940 Kidney transplant status: Secondary | ICD-10-CM | POA: Diagnosis not present

## 2014-08-06 DIAGNOSIS — I10 Essential (primary) hypertension: Secondary | ICD-10-CM | POA: Diagnosis not present

## 2014-08-06 DIAGNOSIS — Z4822 Encounter for aftercare following kidney transplant: Secondary | ICD-10-CM | POA: Diagnosis not present

## 2014-08-06 DIAGNOSIS — T8610 Unspecified complication of kidney transplant: Secondary | ICD-10-CM | POA: Diagnosis not present

## 2014-08-06 DIAGNOSIS — B001 Herpesviral vesicular dermatitis: Secondary | ICD-10-CM | POA: Insufficient documentation

## 2014-08-06 DIAGNOSIS — Z79899 Other long term (current) drug therapy: Secondary | ICD-10-CM | POA: Diagnosis not present

## 2014-08-06 DIAGNOSIS — D899 Disorder involving the immune mechanism, unspecified: Secondary | ICD-10-CM | POA: Diagnosis not present

## 2014-08-06 DIAGNOSIS — E1121 Type 2 diabetes mellitus with diabetic nephropathy: Secondary | ICD-10-CM | POA: Diagnosis not present

## 2014-08-06 DIAGNOSIS — N186 End stage renal disease: Secondary | ICD-10-CM | POA: Diagnosis not present

## 2014-08-08 ENCOUNTER — Other Ambulatory Visit: Payer: Medicare Other

## 2014-08-08 NOTE — Patient Outreach (Signed)
[facility]) Care Management  [place]  08/08/2014   [name] 12/21/1955 [id number]  Subjective:   Objective:   Current Medications:  Current Outpatient Prescriptions  Medication Sig Dispense Refill  . acetaminophen (TYLENOL) 500 MG tablet Take 500 mg by mouth every 6 (six) hours as needed (pain).     Marland Kitchen amiodarone (PACERONE) 400 MG tablet Take 200 mg by mouth daily.    Marland Kitchen apixaban (ELIQUIS) 5 MG TABS tablet Take 5 mg by mouth 2 (two) times daily.    Marland Kitchen aspirin 81 MG chewable tablet Chew 81 mg by mouth daily.    . carvedilol (COREG) 25 MG tablet Take 25 mg by mouth 2 (two) times daily with a meal.     . cloNIDine (CATAPRES) 0.3 MG tablet Take 0.3 mg by mouth 3 (three) times daily.     . cyclobenzaprine (FLEXERIL) 10 MG tablet Take 1 tablet (10 mg total) by mouth 3 (three) times daily as needed for muscle spasms. 20 tablet 0  . docusate sodium (COLACE) 100 MG capsule Take 100 mg by mouth 2 (two) times daily as needed for mild constipation.    . furosemide (LASIX) 20 MG tablet Take 20 mg by mouth daily.     . insulin aspart protamine- aspart (NOVOLOG MIX 70/30) (70-30) 100 UNIT/ML injection Injects 45 units every morning and injects 40units every evening 10 mL 11  . magnesium oxide (MAG-OX) 400 MG tablet Take 400 mg by mouth daily.    . mycophenolate (MYFORTIC) 180 MG EC tablet Take 720 mg by mouth 2 (two) times daily.    Marland Kitchen NIFEdipine (PROCARDIA XL/ADALAT-CC) 60 MG 24 hr tablet Take 60 mg by mouth daily.    Marland Kitchen omeprazole (PRILOSEC) 20 MG capsule Take 20 mg by mouth daily.     Marland Kitchen oxyCODONE-acetaminophen (PERCOCET/ROXICET) 5-325 MG per tablet Take 1 tablet by mouth every 4 (four) hours as needed for pain. 30 tablet 0  . polyethylene glycol (MIRALAX / GLYCOLAX) packet Take 17 g by mouth daily as needed.    . pravastatin (PRAVACHOL) 40 MG tablet Take 40 mg by mouth every Monday, Wednesday, and Friday at 6 PM.     . pregabalin (LYRICA) 100 MG capsule Take 100 mg by  mouth 2 (two) times daily.    . sodium bicarbonate 650 MG tablet Take 650 mg by mouth 2 (two) times daily.    Marland Kitchen sulfamethoxazole-trimethoprim (BACTRIM,SEPTRA) 400-80 MG per tablet Take 1 tablet by mouth every Monday, Wednesday, and Friday.    . tacrolimus (PROGRAF) 1 MG capsule Take 1 mg by mouth 2 (two) times daily. 1mg  in AM and 1mg  in PM     No current facility-administered medications for this visit.    Functional Status:  In your present state of health, do you have any difficulty performing the following activities: 10/16/2013  Is the patient deaf or have difficulty hearing? N  Hearing N  Vision N  Difficulty concentrating or making decisions Y  Walking or climbing stairs? Y  Doing errands, shopping? Y    Fall/Depression Screening: No flowsheet data found.  Assessment: Plan:  Unable to reach patient via telephone to remind him of scheduled home visit for today. Will make final attempt to contact patient on August 14, 2014

## 2014-08-09 DIAGNOSIS — N186 End stage renal disease: Secondary | ICD-10-CM | POA: Diagnosis not present

## 2014-08-09 DIAGNOSIS — M8618 Other acute osteomyelitis, other site: Secondary | ICD-10-CM | POA: Diagnosis not present

## 2014-08-09 DIAGNOSIS — E1121 Type 2 diabetes mellitus with diabetic nephropathy: Secondary | ICD-10-CM | POA: Diagnosis not present

## 2014-08-09 DIAGNOSIS — Z471 Aftercare following joint replacement surgery: Secondary | ICD-10-CM | POA: Diagnosis not present

## 2014-08-09 DIAGNOSIS — M179 Osteoarthritis of knee, unspecified: Secondary | ICD-10-CM | POA: Diagnosis not present

## 2014-08-09 DIAGNOSIS — M5136 Other intervertebral disc degeneration, lumbar region: Secondary | ICD-10-CM | POA: Diagnosis not present

## 2014-08-09 DIAGNOSIS — I10 Essential (primary) hypertension: Secondary | ICD-10-CM | POA: Diagnosis not present

## 2014-08-12 DIAGNOSIS — M8618 Other acute osteomyelitis, other site: Secondary | ICD-10-CM | POA: Diagnosis not present

## 2014-08-12 DIAGNOSIS — Z471 Aftercare following joint replacement surgery: Secondary | ICD-10-CM | POA: Diagnosis not present

## 2014-08-13 DIAGNOSIS — Z471 Aftercare following joint replacement surgery: Secondary | ICD-10-CM | POA: Diagnosis not present

## 2014-08-13 DIAGNOSIS — M8618 Other acute osteomyelitis, other site: Secondary | ICD-10-CM | POA: Diagnosis not present

## 2014-08-14 ENCOUNTER — Other Ambulatory Visit: Payer: Self-pay

## 2014-08-14 ENCOUNTER — Ambulatory Visit: Payer: Self-pay

## 2014-08-14 ENCOUNTER — Ambulatory Visit: Payer: Medicare Other

## 2014-08-14 DIAGNOSIS — Z7982 Long term (current) use of aspirin: Secondary | ICD-10-CM | POA: Diagnosis not present

## 2014-08-14 DIAGNOSIS — Z794 Long term (current) use of insulin: Secondary | ICD-10-CM | POA: Diagnosis not present

## 2014-08-14 DIAGNOSIS — D649 Anemia, unspecified: Secondary | ICD-10-CM | POA: Diagnosis not present

## 2014-08-14 DIAGNOSIS — B001 Herpesviral vesicular dermatitis: Secondary | ICD-10-CM | POA: Diagnosis not present

## 2014-08-14 DIAGNOSIS — E785 Hyperlipidemia, unspecified: Secondary | ICD-10-CM | POA: Diagnosis not present

## 2014-08-14 DIAGNOSIS — T8454XA Infection and inflammatory reaction due to internal left knee prosthesis, initial encounter: Secondary | ICD-10-CM | POA: Diagnosis not present

## 2014-08-14 DIAGNOSIS — I10 Essential (primary) hypertension: Secondary | ICD-10-CM | POA: Diagnosis not present

## 2014-08-14 DIAGNOSIS — Z940 Kidney transplant status: Secondary | ICD-10-CM | POA: Diagnosis not present

## 2014-08-14 DIAGNOSIS — D899 Disorder involving the immune mechanism, unspecified: Secondary | ICD-10-CM | POA: Diagnosis not present

## 2014-08-14 DIAGNOSIS — Z792 Long term (current) use of antibiotics: Secondary | ICD-10-CM | POA: Diagnosis not present

## 2014-08-14 DIAGNOSIS — D8989 Other specified disorders involving the immune mechanism, not elsewhere classified: Secondary | ICD-10-CM | POA: Diagnosis not present

## 2014-08-14 DIAGNOSIS — Z885 Allergy status to narcotic agent status: Secondary | ICD-10-CM | POA: Diagnosis not present

## 2014-08-14 DIAGNOSIS — E119 Type 2 diabetes mellitus without complications: Secondary | ICD-10-CM | POA: Diagnosis not present

## 2014-08-14 NOTE — Patient Instructions (Signed)
This RNCM was successful in making contact with patient via telephone to discuss case management and to schedule appointment for home visit.  Patient's wife answered telephone, stated she and patient were in the doctor's office in [place] and would be available tomorrow for telephonic contact.  This RNCM agreed to make contact with patient via telephone on August 15, 2014

## 2014-08-15 ENCOUNTER — Other Ambulatory Visit: Payer: Self-pay

## 2014-08-15 NOTE — Patient Instructions (Signed)
Unsuccessful attempt made to contact patient via telephone to assess for chronic disease assessment. Will make another attempt to contact patient on tomorrow, August 16, 2014

## 2014-08-16 ENCOUNTER — Other Ambulatory Visit: Payer: Self-pay

## 2014-08-16 DIAGNOSIS — Z471 Aftercare following joint replacement surgery: Secondary | ICD-10-CM | POA: Diagnosis not present

## 2014-08-16 DIAGNOSIS — M8618 Other acute osteomyelitis, other site: Secondary | ICD-10-CM | POA: Diagnosis not present

## 2014-08-16 NOTE — Patient Outreach (Signed)
Home visit scheduled August 27, 2014

## 2014-08-19 DIAGNOSIS — M8618 Other acute osteomyelitis, other site: Secondary | ICD-10-CM | POA: Diagnosis not present

## 2014-08-19 DIAGNOSIS — Z96652 Presence of left artificial knee joint: Secondary | ICD-10-CM | POA: Diagnosis not present

## 2014-08-19 DIAGNOSIS — Z471 Aftercare following joint replacement surgery: Secondary | ICD-10-CM | POA: Diagnosis not present

## 2014-08-20 DIAGNOSIS — Z940 Kidney transplant status: Secondary | ICD-10-CM | POA: Diagnosis not present

## 2014-08-20 DIAGNOSIS — I10 Essential (primary) hypertension: Secondary | ICD-10-CM | POA: Diagnosis not present

## 2014-08-20 DIAGNOSIS — Z471 Aftercare following joint replacement surgery: Secondary | ICD-10-CM | POA: Diagnosis not present

## 2014-08-20 DIAGNOSIS — E119 Type 2 diabetes mellitus without complications: Secondary | ICD-10-CM | POA: Diagnosis not present

## 2014-08-20 DIAGNOSIS — Z794 Long term (current) use of insulin: Secondary | ICD-10-CM | POA: Diagnosis not present

## 2014-08-20 DIAGNOSIS — Z7982 Long term (current) use of aspirin: Secondary | ICD-10-CM | POA: Diagnosis not present

## 2014-08-20 DIAGNOSIS — Z96652 Presence of left artificial knee joint: Secondary | ICD-10-CM | POA: Diagnosis not present

## 2014-08-20 DIAGNOSIS — T8454XA Infection and inflammatory reaction due to internal left knee prosthesis, initial encounter: Secondary | ICD-10-CM | POA: Diagnosis not present

## 2014-08-20 DIAGNOSIS — I340 Nonrheumatic mitral (valve) insufficiency: Secondary | ICD-10-CM | POA: Diagnosis not present

## 2014-08-21 DIAGNOSIS — T8454XA Infection and inflammatory reaction due to internal left knee prosthesis, initial encounter: Secondary | ICD-10-CM | POA: Diagnosis not present

## 2014-08-21 DIAGNOSIS — E1122 Type 2 diabetes mellitus with diabetic chronic kidney disease: Secondary | ICD-10-CM | POA: Diagnosis not present

## 2014-08-21 DIAGNOSIS — Z792 Long term (current) use of antibiotics: Secondary | ICD-10-CM | POA: Diagnosis not present

## 2014-08-21 DIAGNOSIS — Z79899 Other long term (current) drug therapy: Secondary | ICD-10-CM | POA: Diagnosis not present

## 2014-08-21 DIAGNOSIS — Z7982 Long term (current) use of aspirin: Secondary | ICD-10-CM | POA: Diagnosis not present

## 2014-08-21 DIAGNOSIS — N186 End stage renal disease: Secondary | ICD-10-CM | POA: Diagnosis not present

## 2014-08-21 DIAGNOSIS — B998 Other infectious disease: Secondary | ICD-10-CM | POA: Diagnosis not present

## 2014-08-21 DIAGNOSIS — Z885 Allergy status to narcotic agent status: Secondary | ICD-10-CM | POA: Diagnosis not present

## 2014-08-21 DIAGNOSIS — D899 Disorder involving the immune mechanism, unspecified: Secondary | ICD-10-CM | POA: Diagnosis not present

## 2014-08-21 DIAGNOSIS — I120 Hypertensive chronic kidney disease with stage 5 chronic kidney disease or end stage renal disease: Secondary | ICD-10-CM | POA: Diagnosis not present

## 2014-08-21 DIAGNOSIS — E785 Hyperlipidemia, unspecified: Secondary | ICD-10-CM | POA: Diagnosis not present

## 2014-08-21 DIAGNOSIS — I129 Hypertensive chronic kidney disease with stage 1 through stage 4 chronic kidney disease, or unspecified chronic kidney disease: Secondary | ICD-10-CM | POA: Diagnosis not present

## 2014-08-21 DIAGNOSIS — Z794 Long term (current) use of insulin: Secondary | ICD-10-CM | POA: Diagnosis not present

## 2014-08-21 DIAGNOSIS — Z940 Kidney transplant status: Secondary | ICD-10-CM | POA: Diagnosis not present

## 2014-08-21 DIAGNOSIS — D649 Anemia, unspecified: Secondary | ICD-10-CM | POA: Diagnosis not present

## 2014-08-26 DIAGNOSIS — Z452 Encounter for adjustment and management of vascular access device: Secondary | ICD-10-CM | POA: Diagnosis not present

## 2014-08-27 ENCOUNTER — Other Ambulatory Visit: Payer: Self-pay

## 2014-08-27 NOTE — Patient Instructions (Signed)
Please  Call [facility] at [phone] if further case management needs arise.

## 2014-08-27 NOTE — Patient Outreach (Signed)
[facility]) Care Management  08/27/2014  [name] Sep 12, 1955 [id number]   Patient has met long and short term goals as documented in legend documentation system. Patient has been assisted with the following: Securing stable transportation to medical appointments in [place] and to [place], Securing plan for safe housing, patient's mother will be moving to [place] in May and he plans to move in with her. Patient has been educated on diabetic diet, identifying and reducing carbohydrates in diet  Goals reviewed with patient, patient states he feels secure with discharge at this time.   This RNCM educated patient again on referral process for [facility] including self-referral.  [name] RN-BC, [place] Coordinator

## 2014-08-28 DIAGNOSIS — Z940 Kidney transplant status: Secondary | ICD-10-CM | POA: Diagnosis not present

## 2014-09-03 NOTE — Patient Outreach (Signed)
[facility]) Care Management  09/03/2014  JOSHUS ROGAN Dec 07, 1955 [id number]   Received notification from Loni Muse, RN to close case due to goals met.  Ronnell Freshwater. [place] CM Assistant Phone: [phone] Fax: [phone]

## 2014-09-04 DIAGNOSIS — Z940 Kidney transplant status: Secondary | ICD-10-CM | POA: Diagnosis not present

## 2014-09-04 DIAGNOSIS — Z794 Long term (current) use of insulin: Secondary | ICD-10-CM | POA: Diagnosis not present

## 2014-09-04 DIAGNOSIS — Z7982 Long term (current) use of aspirin: Secondary | ICD-10-CM | POA: Diagnosis not present

## 2014-09-04 DIAGNOSIS — Z96652 Presence of left artificial knee joint: Secondary | ICD-10-CM | POA: Diagnosis not present

## 2014-09-04 DIAGNOSIS — I10 Essential (primary) hypertension: Secondary | ICD-10-CM | POA: Diagnosis not present

## 2014-09-04 DIAGNOSIS — E785 Hyperlipidemia, unspecified: Secondary | ICD-10-CM | POA: Diagnosis not present

## 2014-09-04 DIAGNOSIS — I120 Hypertensive chronic kidney disease with stage 5 chronic kidney disease or end stage renal disease: Secondary | ICD-10-CM | POA: Diagnosis not present

## 2014-09-04 DIAGNOSIS — Z4822 Encounter for aftercare following kidney transplant: Secondary | ICD-10-CM | POA: Diagnosis not present

## 2014-09-04 DIAGNOSIS — D649 Anemia, unspecified: Secondary | ICD-10-CM | POA: Diagnosis not present

## 2014-09-04 DIAGNOSIS — E1122 Type 2 diabetes mellitus with diabetic chronic kidney disease: Secondary | ICD-10-CM | POA: Diagnosis not present

## 2014-09-04 DIAGNOSIS — Z5181 Encounter for therapeutic drug level monitoring: Secondary | ICD-10-CM | POA: Diagnosis not present

## 2014-09-04 DIAGNOSIS — Z79899 Other long term (current) drug therapy: Secondary | ICD-10-CM | POA: Diagnosis not present

## 2014-09-04 DIAGNOSIS — N186 End stage renal disease: Secondary | ICD-10-CM | POA: Diagnosis not present

## 2014-09-04 DIAGNOSIS — E119 Type 2 diabetes mellitus without complications: Secondary | ICD-10-CM | POA: Diagnosis not present

## 2014-09-04 DIAGNOSIS — Z792 Long term (current) use of antibiotics: Secondary | ICD-10-CM | POA: Diagnosis not present

## 2014-09-11 DIAGNOSIS — Z79899 Other long term (current) drug therapy: Secondary | ICD-10-CM | POA: Diagnosis not present

## 2014-09-11 DIAGNOSIS — Z940 Kidney transplant status: Secondary | ICD-10-CM | POA: Diagnosis not present

## 2014-09-11 DIAGNOSIS — Z5181 Encounter for therapeutic drug level monitoring: Secondary | ICD-10-CM | POA: Diagnosis not present

## 2014-09-13 DIAGNOSIS — Z940 Kidney transplant status: Secondary | ICD-10-CM | POA: Diagnosis not present

## 2014-09-13 DIAGNOSIS — I151 Hypertension secondary to other renal disorders: Secondary | ICD-10-CM | POA: Diagnosis not present

## 2014-09-13 DIAGNOSIS — E119 Type 2 diabetes mellitus without complications: Secondary | ICD-10-CM | POA: Diagnosis not present

## 2014-09-13 DIAGNOSIS — N2889 Other specified disorders of kidney and ureter: Secondary | ICD-10-CM | POA: Diagnosis not present

## 2014-09-17 ENCOUNTER — Ambulatory Visit: Payer: Medicare Other | Attending: Student | Admitting: Physical Therapy

## 2014-09-17 ENCOUNTER — Encounter: Payer: Self-pay | Admitting: Physical Therapy

## 2014-09-17 DIAGNOSIS — Z96652 Presence of left artificial knee joint: Secondary | ICD-10-CM | POA: Diagnosis not present

## 2014-09-17 DIAGNOSIS — M6281 Muscle weakness (generalized): Secondary | ICD-10-CM | POA: Diagnosis not present

## 2014-09-17 DIAGNOSIS — R262 Difficulty in walking, not elsewhere classified: Secondary | ICD-10-CM | POA: Diagnosis not present

## 2014-09-17 DIAGNOSIS — M25662 Stiffness of left knee, not elsewhere classified: Secondary | ICD-10-CM | POA: Diagnosis not present

## 2014-09-17 NOTE — Patient Instructions (Signed)
Knee flexion 2-3 minutes every 2 hours:  Seated, supine, supine with foot on wall and standing with foot on step.
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
## 2014-09-18 DIAGNOSIS — Z792 Long term (current) use of antibiotics: Secondary | ICD-10-CM | POA: Diagnosis not present

## 2014-09-18 DIAGNOSIS — M7989 Other specified soft tissue disorders: Secondary | ICD-10-CM | POA: Diagnosis not present

## 2014-09-18 DIAGNOSIS — B001 Herpesviral vesicular dermatitis: Secondary | ICD-10-CM | POA: Diagnosis not present

## 2014-09-18 DIAGNOSIS — I151 Hypertension secondary to other renal disorders: Secondary | ICD-10-CM | POA: Diagnosis not present

## 2014-09-18 DIAGNOSIS — Z794 Long term (current) use of insulin: Secondary | ICD-10-CM | POA: Diagnosis not present

## 2014-09-18 DIAGNOSIS — D649 Anemia, unspecified: Secondary | ICD-10-CM | POA: Diagnosis not present

## 2014-09-18 DIAGNOSIS — E785 Hyperlipidemia, unspecified: Secondary | ICD-10-CM | POA: Diagnosis not present

## 2014-09-18 DIAGNOSIS — M25562 Pain in left knee: Secondary | ICD-10-CM | POA: Diagnosis not present

## 2014-09-18 DIAGNOSIS — E119 Type 2 diabetes mellitus without complications: Secondary | ICD-10-CM | POA: Diagnosis not present

## 2014-09-18 DIAGNOSIS — L538 Other specified erythematous conditions: Secondary | ICD-10-CM | POA: Diagnosis not present

## 2014-09-18 DIAGNOSIS — Z7982 Long term (current) use of aspirin: Secondary | ICD-10-CM | POA: Diagnosis not present

## 2014-09-18 DIAGNOSIS — Z96652 Presence of left artificial knee joint: Secondary | ICD-10-CM | POA: Diagnosis not present

## 2014-09-18 DIAGNOSIS — I10 Essential (primary) hypertension: Secondary | ICD-10-CM | POA: Diagnosis not present

## 2014-09-18 DIAGNOSIS — Z4822 Encounter for aftercare following kidney transplant: Secondary | ICD-10-CM | POA: Diagnosis not present

## 2014-09-18 DIAGNOSIS — N2889 Other specified disorders of kidney and ureter: Secondary | ICD-10-CM | POA: Diagnosis not present

## 2014-09-18 DIAGNOSIS — D899 Disorder involving the immune mechanism, unspecified: Secondary | ICD-10-CM | POA: Diagnosis not present

## 2014-09-18 DIAGNOSIS — Z940 Kidney transplant status: Secondary | ICD-10-CM | POA: Diagnosis not present

## 2014-09-18 DIAGNOSIS — Z5181 Encounter for therapeutic drug level monitoring: Secondary | ICD-10-CM | POA: Diagnosis not present

## 2014-09-18 DIAGNOSIS — Z79899 Other long term (current) drug therapy: Secondary | ICD-10-CM | POA: Diagnosis not present

## 2014-09-30 DIAGNOSIS — Z79899 Other long term (current) drug therapy: Secondary | ICD-10-CM | POA: Diagnosis not present

## 2014-09-30 DIAGNOSIS — Z7982 Long term (current) use of aspirin: Secondary | ICD-10-CM | POA: Diagnosis not present

## 2014-09-30 DIAGNOSIS — E1122 Type 2 diabetes mellitus with diabetic chronic kidney disease: Secondary | ICD-10-CM | POA: Diagnosis not present

## 2014-09-30 DIAGNOSIS — Z96652 Presence of left artificial knee joint: Secondary | ICD-10-CM | POA: Diagnosis not present

## 2014-09-30 DIAGNOSIS — E785 Hyperlipidemia, unspecified: Secondary | ICD-10-CM | POA: Diagnosis not present

## 2014-09-30 DIAGNOSIS — D8989 Other specified disorders involving the immune mechanism, not elsewhere classified: Secondary | ICD-10-CM | POA: Diagnosis not present

## 2014-09-30 DIAGNOSIS — Z4822 Encounter for aftercare following kidney transplant: Secondary | ICD-10-CM | POA: Diagnosis not present

## 2014-09-30 DIAGNOSIS — Z940 Kidney transplant status: Secondary | ICD-10-CM | POA: Diagnosis not present

## 2014-09-30 DIAGNOSIS — D649 Anemia, unspecified: Secondary | ICD-10-CM | POA: Diagnosis not present

## 2014-09-30 DIAGNOSIS — Z794 Long term (current) use of insulin: Secondary | ICD-10-CM | POA: Diagnosis not present

## 2014-09-30 DIAGNOSIS — E119 Type 2 diabetes mellitus without complications: Secondary | ICD-10-CM | POA: Diagnosis not present

## 2014-09-30 DIAGNOSIS — T8454XA Infection and inflammatory reaction due to internal left knee prosthesis, initial encounter: Secondary | ICD-10-CM | POA: Diagnosis not present

## 2014-09-30 DIAGNOSIS — Z5181 Encounter for therapeutic drug level monitoring: Secondary | ICD-10-CM | POA: Diagnosis not present

## 2014-09-30 DIAGNOSIS — Z792 Long term (current) use of antibiotics: Secondary | ICD-10-CM | POA: Diagnosis not present

## 2014-09-30 DIAGNOSIS — I10 Essential (primary) hypertension: Secondary | ICD-10-CM | POA: Diagnosis not present

## 2014-10-01 ENCOUNTER — Ambulatory Visit: Payer: Medicare Other | Admitting: Physical Therapy

## 2014-10-01 DIAGNOSIS — M25662 Stiffness of left knee, not elsewhere classified: Secondary | ICD-10-CM

## 2014-10-01 DIAGNOSIS — M6281 Muscle weakness (generalized): Secondary | ICD-10-CM

## 2014-10-01 DIAGNOSIS — Z96652 Presence of left artificial knee joint: Secondary | ICD-10-CM | POA: Diagnosis not present
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
## 2014-10-01 NOTE — Patient Instructions (Signed)
Retrograde soft tissue work to be done at home prior to exercise.

## 2014-10-03 ENCOUNTER — Ambulatory Visit: Payer: Medicare Other | Admitting: Physical Therapy

## 2014-10-03 DIAGNOSIS — Z96652 Presence of left artificial knee joint: Secondary | ICD-10-CM

## 2014-10-03 DIAGNOSIS — M25662 Stiffness of left knee, not elsewhere classified: Secondary | ICD-10-CM

## 2014-10-03 DIAGNOSIS — M6281 Muscle weakness (generalized): Secondary | ICD-10-CM
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
## 2014-10-08 ENCOUNTER — Ambulatory Visit: Payer: Medicare Other | Admitting: Physical Therapy

## 2014-10-10 ENCOUNTER — Ambulatory Visit: Payer: Medicare Other | Attending: Student | Admitting: Physical Therapy

## 2014-10-10 DIAGNOSIS — Z96652 Presence of left artificial knee joint: Secondary | ICD-10-CM | POA: Insufficient documentation

## 2014-10-10 DIAGNOSIS — M6281 Muscle weakness (generalized): Secondary | ICD-10-CM | POA: Insufficient documentation

## 2014-10-10 DIAGNOSIS — M25662 Stiffness of left knee, not elsewhere classified: Secondary | ICD-10-CM | POA: Insufficient documentation

## 2014-10-10 DIAGNOSIS — R262 Difficulty in walking, not elsewhere classified: Secondary | ICD-10-CM | POA: Insufficient documentation
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
## 2014-10-11 ENCOUNTER — Encounter (HOSPITAL_COMMUNITY): Payer: Self-pay | Admitting: Cardiology

## 2014-10-11 ENCOUNTER — Emergency Department (HOSPITAL_COMMUNITY)
Admission: EM | Admit: 2014-10-11 | Discharge: 2014-10-11 | Discharge status: Against Medical Advice | Payer: Medicare Other | Attending: Emergency Medicine | Admitting: Emergency Medicine

## 2014-10-11 DIAGNOSIS — E119 Type 2 diabetes mellitus without complications: Secondary | ICD-10-CM | POA: Insufficient documentation

## 2014-10-11 DIAGNOSIS — I509 Heart failure, unspecified: Secondary | ICD-10-CM | POA: Insufficient documentation

## 2014-10-11 DIAGNOSIS — M7989 Other specified soft tissue disorders: Secondary | ICD-10-CM | POA: Insufficient documentation

## 2014-10-11 DIAGNOSIS — M25569 Pain in unspecified knee: Secondary | ICD-10-CM | POA: Diagnosis not present

## 2014-10-11 DIAGNOSIS — I10 Essential (primary) hypertension: Secondary | ICD-10-CM | POA: Insufficient documentation

## 2014-10-11 NOTE — ED Notes (Signed)
Patient called multiple times over the past 10 minutes with no response in waiting room and outside ED

## 2014-10-11 NOTE — ED Notes (Signed)
Pt reports he had a knee replacement done in March and has been having PT. Reports swelling and heat to the knee recently. States the Ortho MD is concerned about a DVT or infection.

## 2014-10-13 ENCOUNTER — Emergency Department (HOSPITAL_BASED_OUTPATIENT_CLINIC_OR_DEPARTMENT_OTHER)
Admit: 2014-10-13 | Discharge: 2014-10-13 | Disposition: A | Discharge status: Home / Self Care | Payer: Medicare Other | Attending: Emergency Medicine | Admitting: Emergency Medicine

## 2014-10-13 ENCOUNTER — Emergency Department (HOSPITAL_COMMUNITY)
Admission: EM | Admit: 2014-10-13 | Discharge: 2014-10-13 | Disposition: A | Discharge status: Home / Self Care | Payer: Medicare Other | Attending: Emergency Medicine | Admitting: Emergency Medicine

## 2014-10-13 ENCOUNTER — Encounter (HOSPITAL_COMMUNITY): Payer: Self-pay | Admitting: *Deleted

## 2014-10-13 ENCOUNTER — Emergency Department (HOSPITAL_COMMUNITY): Payer: Medicare Other

## 2014-10-13 DIAGNOSIS — M2548 Effusion, other site: Secondary | ICD-10-CM | POA: Insufficient documentation

## 2014-10-13 DIAGNOSIS — Z87828 Personal history of other (healed) physical injury and trauma: Secondary | ICD-10-CM | POA: Diagnosis not present

## 2014-10-13 DIAGNOSIS — M25562 Pain in left knee: Secondary | ICD-10-CM | POA: Diagnosis not present

## 2014-10-13 DIAGNOSIS — I509 Heart failure, unspecified: Secondary | ICD-10-CM | POA: Diagnosis not present

## 2014-10-13 DIAGNOSIS — I10 Essential (primary) hypertension: Secondary | ICD-10-CM | POA: Diagnosis not present

## 2014-10-13 DIAGNOSIS — E119 Type 2 diabetes mellitus without complications: Secondary | ICD-10-CM | POA: Diagnosis not present

## 2014-10-13 DIAGNOSIS — M7989 Other specified soft tissue disorders: Secondary | ICD-10-CM

## 2014-10-13 DIAGNOSIS — M79662 Pain in left lower leg: Secondary | ICD-10-CM

## 2014-10-13 DIAGNOSIS — Z7982 Long term (current) use of aspirin: Secondary | ICD-10-CM | POA: Insufficient documentation

## 2014-10-13 DIAGNOSIS — Z87448 Personal history of other diseases of urinary system: Secondary | ICD-10-CM | POA: Insufficient documentation

## 2014-10-13 DIAGNOSIS — Z794 Long term (current) use of insulin: Secondary | ICD-10-CM | POA: Diagnosis not present

## 2014-10-13 DIAGNOSIS — Z79899 Other long term (current) drug therapy: Secondary | ICD-10-CM | POA: Insufficient documentation

## 2014-10-13 DIAGNOSIS — K219 Gastro-esophageal reflux disease without esophagitis: Secondary | ICD-10-CM | POA: Diagnosis not present

## 2014-10-13 DIAGNOSIS — Z7902 Long term (current) use of antithrombotics/antiplatelets: Secondary | ICD-10-CM | POA: Diagnosis not present

## 2014-10-13 DIAGNOSIS — M79605 Pain in left leg: Secondary | ICD-10-CM | POA: Diagnosis present

## 2014-10-13 LAB — BASIC METABOLIC PANEL
Anion gap: 10 (ref 5–15)
BUN: 10 mg/dL (ref 6–20)
CO2: 23 mmol/L (ref 22–32)
Calcium: 9.8 mg/dL (ref 8.9–10.3)
Chloride: 104 mmol/L (ref 101–111)
Creatinine, Ser: 1.26 mg/dL — ABNORMAL HIGH (ref 0.61–1.24)
GFR calc Af Amer: >60 mL/min (ref >60)
GFR calc non Af Amer: >60 mL/min (ref >60)
Glucose, Bld: 174 mg/dL — ABNORMAL HIGH (ref 65–99)
Potassium: 4.3 mmol/L (ref 3.5–5.1)
Sodium: 137 mmol/L (ref 135–145)

## 2014-10-13 LAB — CBC WITH DIFFERENTIAL/PLATELET
Basophils Absolute: 0 10*3/uL (ref 0.0–0.1)
Basophils Relative: 0 % (ref 0–1)
Eosinophils Absolute: 0.1 10*3/uL (ref 0.0–0.7)
Eosinophils Relative: 1 % (ref 0–5)
HCT: 43.1 % (ref 39.0–52.0)
Hemoglobin: 13.8 g/dL (ref 13.0–17.0)
Lymphocytes Relative: 24 % (ref 12–46)
Lymphs Abs: 1.4 10*3/uL (ref 0.7–4.0)
MCH: 25.2 pg — ABNORMAL LOW (ref 26.0–34.0)
MCHC: 32 g/dL (ref 30.0–36.0)
MCV: 78.8 fL (ref 78.0–100.0)
Monocytes Absolute: 0.4 10*3/uL (ref 0.1–1.0)
Monocytes Relative: 7 % (ref 3–12)
Neutro Abs: 4.1 10*3/uL (ref 1.7–7.7)
Neutrophils Relative %: 68 % (ref 43–77)
Platelets: 257 10*3/uL (ref 150–400)
RBC: 5.47 MIL/uL (ref 4.22–5.81)
RDW: 15.7 % — ABNORMAL HIGH (ref 11.5–15.5)
WBC: 6 10*3/uL (ref 4.0–10.5)

## 2014-10-13 LAB — C-REACTIVE PROTEIN: CRP: 0.8 mg/dL (ref <1.0)

## 2014-10-13 LAB — SEDIMENTATION RATE: Sed Rate: 20 mm/hr — ABNORMAL HIGH (ref 0–16)

## 2014-10-13 LAB — CBG MONITORING, ED: Glucose-Capillary: 188 mg/dL — ABNORMAL HIGH (ref 65–99)

## 2014-10-13 MED ORDER — SULFAMETHOXAZOLE-TRIMETHOPRIM 800-160 MG PO TABS: 1.0000 | ORAL_TABLET | Freq: Once | ORAL | Status: DC

## 2014-10-13 MED ORDER — HYDROCODONE-ACETAMINOPHEN 5-325 MG PO TABS
1.0000 | ORAL_TABLET | Freq: Once | ORAL | Status: AC
  Administered 2014-10-13: 1 via ORAL
  Filled 2014-10-13: qty 1

## 2014-10-13 NOTE — Progress Notes (Signed)
VASCULAR LAB PRELIMINARY  PRELIMINARY  PRELIMINARY  PRELIMINARY  Left lower extremity venous Doppler completed.    Preliminary report:  There is no DVT or SVT noted in the left lower extremity.  Interstitial fluid noted in left calf.   Kaspar Albornoz, RVT 10/13/2014, 5:35 PM

## 2014-10-13 NOTE — ED Notes (Signed)
RN attempted to obtain labs. Unsuccessful. Notified phlebotomy

## 2014-10-13 NOTE — Discharge Instructions (Signed)
Knee Rehabilitation, Guidelines Following Surgery Results after knee surgery are often greatly improved when you follow the exercise, range of motion and muscle strengthening exercises prescribed by your doctor. Safety measures are also important to protect the knee from further injury. Any time any of these exercises cause you to have increased pain or swelling in your knee joint, decrease the amount until you are comfortable again and slowly increase them. If you have problems or questions, call your caregiver or physical therapist for advice. HOME CARE INSTRUCTIONS   Remove items at home which could result in a fall. This includes throw rugs or furniture in walking pathways.  Continue medications as instructed.  You may shower or take tub baths when your staples or stitches are removed or as instructed.  Walk using crutches or walker as instructed.  Put weight on your legs and walk as much as is comfortable.  You may resume a sexual relationship in one month or when given the OK by your doctor.  Return to work as instructed by your doctor.  Do not drive a car for 6 weeks or as instructed.  Wear elastic stockings until instructed not to.  Make sure you keep all of your appointments after your operation with all of your doctors and caregivers. RANGE OF MOTION AND STRENGTHENING EXERCISES Rehabilitation of the knee is important following a knee injury or an operation. After just a few days of immobilization, the muscles of the thigh which control the knee become weakened and shrink (atrophy). Knee exercises are designed to build up the tone and strength of the thigh muscles and to improve knee motion. Often times heat used for twenty to thirty minutes before working out will loosen up your tissues and help with improving the range of motion. These exercises can be done on a training (exercise) mat, on the floor, on a table or on a bed. Use what ever works the best and is most comfortable for  you Knee exercises include:  Leg Lifts - While your knee is still immobilized in a splint or cast, you can do straight leg raises. Lift the leg to 60 degrees, hold for 3 sec, and slowly lower the leg. Repeat 10-20 times 2-3 times daily. Perform this exercise against resistance later as your knee gets better.  Quad and Hamstring Sets - Tighten up the muscle on the front of the thigh (Quad) and hold for 5-10 sec. Repeat this 10-20 times hourly. Hamstring sets are done by pushing the foot backward against an object and holding for 5-10 sec. Repeat as with quad sets.  Resistance and Weight exercises - After your knee no longer needs to be immobilized, progressive motion, resistance, and weight lifting exercises should be performed. This is best done under the guidance of a physical therapist. You may safely start with wall squats; with your feet 10 inches from a wall, place your back flat against the wall and lower your trunk about 6 inches until you feel work in your thighs. Hold 20-40 seconds, then rise slowly. Do 3-6 repetitions 2-3 times daily.  Endurance Training - Bicycle and walking are helpful in restoring strength and endurance to the leg. A rehabilitation program following serious knee injuries can speed recovery and prevent re-injury in the future due to weakened muscles. Contact your doctor or a physical therapist for more information on knee rehabilitation. MAKE SURE YOU:   Understand these instructions.  Will watch your condition.  Will get help right away if you are not doing  well or get worse. Document Released: 04/26/2005 Document Revised: 07/19/2011 Document Reviewed: 10/14/2006 [facility] Patient Information 2015 [place], [place]. This information is not intended to replace advice given to you by your health care provider. Make sure you discuss any questions you have with your health care provider.
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
## 2014-10-13 NOTE — ED Notes (Signed)
Pt states that he has had left leg pain and swelling. Pt states that it is tender to touch. Pt noted to have some reddness to leg as well. Pt had knee surgery on same leg in march.

## 2014-10-13 NOTE — ED Notes (Signed)
Pt transported to Xray. 

## 2014-10-14 ENCOUNTER — Ambulatory Visit: Payer: Medicare Other | Admitting: Physical Therapy

## 2014-10-15 DIAGNOSIS — M799 Soft tissue disorder, unspecified: Secondary | ICD-10-CM | POA: Diagnosis not present

## 2014-10-15 DIAGNOSIS — Z471 Aftercare following joint replacement surgery: Secondary | ICD-10-CM | POA: Diagnosis not present

## 2014-10-15 DIAGNOSIS — I7090 Unspecified atherosclerosis: Secondary | ICD-10-CM | POA: Diagnosis not present

## 2014-10-15 DIAGNOSIS — M25462 Effusion, left knee: Secondary | ICD-10-CM | POA: Diagnosis not present

## 2014-10-15 DIAGNOSIS — Z96652 Presence of left artificial knee joint: Secondary | ICD-10-CM | POA: Diagnosis not present

## 2014-10-16 ENCOUNTER — Ambulatory Visit: Payer: Medicare Other | Admitting: Physical Therapy

## 2014-10-16 DIAGNOSIS — Z96652 Presence of left artificial knee joint: Secondary | ICD-10-CM

## 2014-10-16 DIAGNOSIS — M25662 Stiffness of left knee, not elsewhere classified: Secondary | ICD-10-CM | POA: Diagnosis not present

## 2014-10-16 DIAGNOSIS — M6281 Muscle weakness (generalized): Secondary | ICD-10-CM

## 2014-10-16 DIAGNOSIS — R262 Difficulty in walking, not elsewhere classified: Secondary | ICD-10-CM
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
## 2014-10-24 ENCOUNTER — Ambulatory Visit: Payer: Medicare Other | Admitting: Physical Therapy

## 2014-10-24 DIAGNOSIS — R262 Difficulty in walking, not elsewhere classified: Secondary | ICD-10-CM

## 2014-10-24 DIAGNOSIS — M25662 Stiffness of left knee, not elsewhere classified: Secondary | ICD-10-CM

## 2014-10-24 DIAGNOSIS — Z96652 Presence of left artificial knee joint: Secondary | ICD-10-CM

## 2014-10-24 DIAGNOSIS — M6281 Muscle weakness (generalized): Secondary | ICD-10-CM
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
## 2014-10-29 ENCOUNTER — Ambulatory Visit: Payer: Medicare Other | Admitting: Physical Therapy

## 2014-10-29 DIAGNOSIS — Z471 Aftercare following joint replacement surgery: Secondary | ICD-10-CM | POA: Diagnosis not present

## 2014-10-29 DIAGNOSIS — M25462 Effusion, left knee: Secondary | ICD-10-CM | POA: Diagnosis not present

## 2014-10-29 DIAGNOSIS — M25562 Pain in left knee: Secondary | ICD-10-CM | POA: Diagnosis not present

## 2014-10-29 DIAGNOSIS — Z4789 Encounter for other orthopedic aftercare: Secondary | ICD-10-CM | POA: Diagnosis not present

## 2014-10-29 DIAGNOSIS — Z96652 Presence of left artificial knee joint: Secondary | ICD-10-CM | POA: Diagnosis not present

## 2014-10-31 ENCOUNTER — Ambulatory Visit: Payer: Medicare Other | Admitting: Physical Therapy

## 2014-10-31 DIAGNOSIS — Z96652 Presence of left artificial knee joint: Secondary | ICD-10-CM

## 2014-10-31 DIAGNOSIS — M25662 Stiffness of left knee, not elsewhere classified: Secondary | ICD-10-CM | POA: Diagnosis not present

## 2014-10-31 DIAGNOSIS — M6281 Muscle weakness (generalized): Secondary | ICD-10-CM

## 2014-10-31 DIAGNOSIS — R262 Difficulty in walking, not elsewhere classified: Secondary | ICD-10-CM
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
## 2014-11-05 ENCOUNTER — Ambulatory Visit: Payer: Medicare Other | Admitting: Physical Therapy

## 2014-11-05 DIAGNOSIS — M25662 Stiffness of left knee, not elsewhere classified: Secondary | ICD-10-CM

## 2014-11-05 DIAGNOSIS — R262 Difficulty in walking, not elsewhere classified: Secondary | ICD-10-CM

## 2014-11-05 DIAGNOSIS — M6281 Muscle weakness (generalized): Secondary | ICD-10-CM
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
## 2014-11-07 ENCOUNTER — Ambulatory Visit: Payer: Medicare Other | Admitting: Physical Therapy

## 2014-11-07 DIAGNOSIS — R262 Difficulty in walking, not elsewhere classified: Secondary | ICD-10-CM

## 2014-11-07 DIAGNOSIS — Z96652 Presence of left artificial knee joint: Secondary | ICD-10-CM

## 2014-11-07 DIAGNOSIS — M6281 Muscle weakness (generalized): Secondary | ICD-10-CM

## 2014-11-07 DIAGNOSIS — M25662 Stiffness of left knee, not elsewhere classified: Secondary | ICD-10-CM | POA: Diagnosis not present
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
## 2014-11-19 ENCOUNTER — Ambulatory Visit: Payer: Medicare Other | Attending: Student | Admitting: Physical Therapy

## 2014-11-19 DIAGNOSIS — R262 Difficulty in walking, not elsewhere classified: Secondary | ICD-10-CM | POA: Diagnosis not present

## 2014-11-19 DIAGNOSIS — M25662 Stiffness of left knee, not elsewhere classified: Secondary | ICD-10-CM | POA: Insufficient documentation

## 2014-11-19 DIAGNOSIS — Z96652 Presence of left artificial knee joint: Secondary | ICD-10-CM

## 2014-11-19 DIAGNOSIS — M6281 Muscle weakness (generalized): Secondary | ICD-10-CM | POA: Diagnosis not present
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
## 2014-11-21 ENCOUNTER — Ambulatory Visit: Payer: Medicare Other | Admitting: Physical Therapy

## 2014-11-21 DIAGNOSIS — R262 Difficulty in walking, not elsewhere classified: Secondary | ICD-10-CM

## 2014-11-21 DIAGNOSIS — Z96652 Presence of left artificial knee joint: Secondary | ICD-10-CM | POA: Diagnosis not present

## 2014-11-21 DIAGNOSIS — M6281 Muscle weakness (generalized): Secondary | ICD-10-CM

## 2014-11-21 DIAGNOSIS — M25662 Stiffness of left knee, not elsewhere classified: Secondary | ICD-10-CM
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
## 2014-11-26 ENCOUNTER — Ambulatory Visit: Payer: Medicare Other | Admitting: Physical Therapy

## 2014-11-26 DIAGNOSIS — R262 Difficulty in walking, not elsewhere classified: Secondary | ICD-10-CM | POA: Diagnosis not present

## 2014-11-26 DIAGNOSIS — M25662 Stiffness of left knee, not elsewhere classified: Secondary | ICD-10-CM | POA: Diagnosis not present

## 2014-11-26 DIAGNOSIS — Z96652 Presence of left artificial knee joint: Secondary | ICD-10-CM

## 2014-11-26 DIAGNOSIS — M6281 Muscle weakness (generalized): Secondary | ICD-10-CM
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
## 2014-11-28 ENCOUNTER — Ambulatory Visit: Payer: Medicare Other | Admitting: Physical Therapy

## 2014-11-28 DIAGNOSIS — M25662 Stiffness of left knee, not elsewhere classified: Secondary | ICD-10-CM | POA: Diagnosis not present

## 2014-11-28 DIAGNOSIS — Z96652 Presence of left artificial knee joint: Secondary | ICD-10-CM

## 2014-11-28 DIAGNOSIS — R262 Difficulty in walking, not elsewhere classified: Secondary | ICD-10-CM

## 2014-11-28 DIAGNOSIS — M6281 Muscle weakness (generalized): Secondary | ICD-10-CM | POA: Diagnosis not present
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
## 2014-12-10 ENCOUNTER — Encounter: Payer: Medicare Other | Admitting: Physical Therapy

## 2014-12-11 DIAGNOSIS — E1140 Type 2 diabetes mellitus with diabetic neuropathy, unspecified: Secondary | ICD-10-CM | POA: Diagnosis not present

## 2014-12-11 DIAGNOSIS — Z96652 Presence of left artificial knee joint: Secondary | ICD-10-CM | POA: Diagnosis not present

## 2014-12-11 DIAGNOSIS — Z794 Long term (current) use of insulin: Secondary | ICD-10-CM | POA: Diagnosis not present

## 2014-12-11 DIAGNOSIS — Z471 Aftercare following joint replacement surgery: Secondary | ICD-10-CM | POA: Diagnosis not present

## 2014-12-11 DIAGNOSIS — N186 End stage renal disease: Secondary | ICD-10-CM | POA: Diagnosis not present

## 2014-12-11 DIAGNOSIS — Z7982 Long term (current) use of aspirin: Secondary | ICD-10-CM | POA: Diagnosis not present

## 2014-12-11 DIAGNOSIS — Z940 Kidney transplant status: Secondary | ICD-10-CM | POA: Diagnosis not present

## 2014-12-11 DIAGNOSIS — Z79899 Other long term (current) drug therapy: Secondary | ICD-10-CM | POA: Diagnosis not present

## 2014-12-11 DIAGNOSIS — E1122 Type 2 diabetes mellitus with diabetic chronic kidney disease: Secondary | ICD-10-CM | POA: Diagnosis not present

## 2014-12-11 DIAGNOSIS — Z886 Allergy status to analgesic agent status: Secondary | ICD-10-CM | POA: Diagnosis not present

## 2014-12-11 DIAGNOSIS — M25561 Pain in right knee: Secondary | ICD-10-CM | POA: Diagnosis not present

## 2014-12-11 DIAGNOSIS — I120 Hypertensive chronic kidney disease with stage 5 chronic kidney disease or end stage renal disease: Secondary | ICD-10-CM | POA: Diagnosis not present

## 2014-12-12 ENCOUNTER — Ambulatory Visit: Payer: Medicare Other | Attending: Student | Admitting: Physical Therapy

## 2014-12-12 DIAGNOSIS — M25662 Stiffness of left knee, not elsewhere classified: Secondary | ICD-10-CM | POA: Insufficient documentation

## 2014-12-12 DIAGNOSIS — M6281 Muscle weakness (generalized): Secondary | ICD-10-CM | POA: Insufficient documentation

## 2014-12-12 DIAGNOSIS — Z96652 Presence of left artificial knee joint: Secondary | ICD-10-CM | POA: Insufficient documentation

## 2014-12-12 DIAGNOSIS — R262 Difficulty in walking, not elsewhere classified: Secondary | ICD-10-CM | POA: Insufficient documentation

## 2014-12-23 DIAGNOSIS — Z7901 Long term (current) use of anticoagulants: Secondary | ICD-10-CM | POA: Diagnosis not present

## 2014-12-23 DIAGNOSIS — I4892 Unspecified atrial flutter: Secondary | ICD-10-CM | POA: Diagnosis not present

## 2014-12-23 DIAGNOSIS — Z8679 Personal history of other diseases of the circulatory system: Secondary | ICD-10-CM | POA: Diagnosis not present

## 2014-12-30 ENCOUNTER — Ambulatory Visit: Payer: Medicare Other | Admitting: Physical Therapy

## 2014-12-30 DIAGNOSIS — M6281 Muscle weakness (generalized): Secondary | ICD-10-CM

## 2014-12-30 DIAGNOSIS — M25662 Stiffness of left knee, not elsewhere classified: Secondary | ICD-10-CM | POA: Diagnosis not present

## 2014-12-30 DIAGNOSIS — R262 Difficulty in walking, not elsewhere classified: Secondary | ICD-10-CM | POA: Diagnosis not present

## 2014-12-30 DIAGNOSIS — Z96652 Presence of left artificial knee joint: Secondary | ICD-10-CM | POA: Diagnosis not present
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
## 2014-12-31 DIAGNOSIS — E119 Type 2 diabetes mellitus without complications: Secondary | ICD-10-CM | POA: Diagnosis not present

## 2014-12-31 DIAGNOSIS — H26491 Other secondary cataract, right eye: Secondary | ICD-10-CM | POA: Diagnosis not present

## 2015-01-06 DIAGNOSIS — Z940 Kidney transplant status: Secondary | ICD-10-CM | POA: Diagnosis not present

## 2015-01-06 DIAGNOSIS — Z79899 Other long term (current) drug therapy: Secondary | ICD-10-CM | POA: Diagnosis not present

## 2015-01-07 ENCOUNTER — Ambulatory Visit: Payer: Medicare Other | Admitting: Physical Therapy

## 2015-01-07 DIAGNOSIS — E119 Type 2 diabetes mellitus without complications: Secondary | ICD-10-CM | POA: Diagnosis not present

## 2015-01-07 DIAGNOSIS — N2589 Other disorders resulting from impaired renal tubular function: Secondary | ICD-10-CM | POA: Diagnosis not present

## 2015-01-07 DIAGNOSIS — R262 Difficulty in walking, not elsewhere classified: Secondary | ICD-10-CM | POA: Diagnosis not present

## 2015-01-07 DIAGNOSIS — M6281 Muscle weakness (generalized): Secondary | ICD-10-CM | POA: Diagnosis not present

## 2015-01-07 DIAGNOSIS — M25662 Stiffness of left knee, not elsewhere classified: Secondary | ICD-10-CM

## 2015-01-07 DIAGNOSIS — Z96652 Presence of left artificial knee joint: Secondary | ICD-10-CM

## 2015-01-07 DIAGNOSIS — I151 Hypertension secondary to other renal disorders: Secondary | ICD-10-CM | POA: Diagnosis not present

## 2015-01-07 DIAGNOSIS — Z940 Kidney transplant status: Secondary | ICD-10-CM | POA: Diagnosis not present

## 2015-01-07 NOTE — Patient Instructions (Signed)
   Welles Walthall PT, DPT, LAT, ATC  [facility] Phone: [phone]     
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
## 2015-01-10 ENCOUNTER — Encounter: Payer: Medicare Other | Admitting: Physical Therapy

## 2015-01-16 ENCOUNTER — Ambulatory Visit: Payer: Medicare Other | Attending: Student | Admitting: Physical Therapy

## 2015-01-16 DIAGNOSIS — Z96652 Presence of left artificial knee joint: Secondary | ICD-10-CM | POA: Diagnosis not present

## 2015-01-16 DIAGNOSIS — M6281 Muscle weakness (generalized): Secondary | ICD-10-CM

## 2015-01-16 DIAGNOSIS — R262 Difficulty in walking, not elsewhere classified: Secondary | ICD-10-CM | POA: Diagnosis not present

## 2015-01-16 DIAGNOSIS — M25662 Stiffness of left knee, not elsewhere classified: Secondary | ICD-10-CM | POA: Diagnosis not present
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
## 2015-01-20 ENCOUNTER — Encounter: Payer: Medicare Other | Admitting: Physical Therapy

## 2015-01-20 DIAGNOSIS — Z471 Aftercare following joint replacement surgery: Secondary | ICD-10-CM | POA: Diagnosis not present

## 2015-01-20 DIAGNOSIS — Z96652 Presence of left artificial knee joint: Secondary | ICD-10-CM | POA: Diagnosis not present

## 2015-01-22 ENCOUNTER — Ambulatory Visit: Payer: Medicare Other | Admitting: Physical Therapy

## 2015-01-22 DIAGNOSIS — Z96652 Presence of left artificial knee joint: Secondary | ICD-10-CM | POA: Diagnosis not present

## 2015-01-22 DIAGNOSIS — R262 Difficulty in walking, not elsewhere classified: Secondary | ICD-10-CM | POA: Diagnosis not present

## 2015-01-22 DIAGNOSIS — M25662 Stiffness of left knee, not elsewhere classified: Secondary | ICD-10-CM | POA: Diagnosis not present

## 2015-01-22 DIAGNOSIS — M6281 Muscle weakness (generalized): Secondary | ICD-10-CM | POA: Diagnosis not present
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
## 2015-01-27 ENCOUNTER — Ambulatory Visit: Payer: Medicare Other | Admitting: Physical Therapy

## 2015-01-27 DIAGNOSIS — M25662 Stiffness of left knee, not elsewhere classified: Secondary | ICD-10-CM

## 2015-01-27 DIAGNOSIS — M6281 Muscle weakness (generalized): Secondary | ICD-10-CM | POA: Diagnosis not present

## 2015-01-27 DIAGNOSIS — Z96652 Presence of left artificial knee joint: Secondary | ICD-10-CM

## 2015-01-27 DIAGNOSIS — R262 Difficulty in walking, not elsewhere classified: Secondary | ICD-10-CM
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
## 2015-01-29 ENCOUNTER — Ambulatory Visit: Payer: Medicare Other | Admitting: Physical Therapy

## 2015-01-29 DIAGNOSIS — M25662 Stiffness of left knee, not elsewhere classified: Secondary | ICD-10-CM | POA: Diagnosis not present

## 2015-01-29 DIAGNOSIS — R262 Difficulty in walking, not elsewhere classified: Secondary | ICD-10-CM

## 2015-01-29 DIAGNOSIS — M6281 Muscle weakness (generalized): Secondary | ICD-10-CM | POA: Diagnosis not present

## 2015-01-29 DIAGNOSIS — Z96652 Presence of left artificial knee joint: Secondary | ICD-10-CM | POA: Diagnosis not present
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
## 2015-02-03 ENCOUNTER — Ambulatory Visit: Payer: Medicare Other | Admitting: Physical Therapy

## 2015-02-03 DIAGNOSIS — M25662 Stiffness of left knee, not elsewhere classified: Secondary | ICD-10-CM | POA: Diagnosis not present

## 2015-02-03 DIAGNOSIS — M6281 Muscle weakness (generalized): Secondary | ICD-10-CM

## 2015-02-03 DIAGNOSIS — Z96652 Presence of left artificial knee joint: Secondary | ICD-10-CM | POA: Diagnosis not present

## 2015-02-03 DIAGNOSIS — R262 Difficulty in walking, not elsewhere classified: Secondary | ICD-10-CM | POA: Diagnosis not present
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
## 2015-02-05 ENCOUNTER — Ambulatory Visit: Payer: Medicare Other | Admitting: Physical Therapy

## 2015-02-05 DIAGNOSIS — R262 Difficulty in walking, not elsewhere classified: Secondary | ICD-10-CM

## 2015-02-05 DIAGNOSIS — M25662 Stiffness of left knee, not elsewhere classified: Secondary | ICD-10-CM

## 2015-02-05 DIAGNOSIS — M6281 Muscle weakness (generalized): Secondary | ICD-10-CM

## 2015-02-05 DIAGNOSIS — Z96652 Presence of left artificial knee joint: Secondary | ICD-10-CM | POA: Diagnosis not present
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
## 2015-02-10 ENCOUNTER — Ambulatory Visit: Payer: Medicare Other | Attending: Student | Admitting: Physical Therapy

## 2015-02-10 DIAGNOSIS — R262 Difficulty in walking, not elsewhere classified: Secondary | ICD-10-CM | POA: Diagnosis not present

## 2015-02-10 DIAGNOSIS — M6281 Muscle weakness (generalized): Secondary | ICD-10-CM | POA: Insufficient documentation

## 2015-02-10 DIAGNOSIS — M25662 Stiffness of left knee, not elsewhere classified: Secondary | ICD-10-CM | POA: Insufficient documentation

## 2015-02-10 DIAGNOSIS — Z96652 Presence of left artificial knee joint: Secondary | ICD-10-CM | POA: Diagnosis not present
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
## 2015-02-12 ENCOUNTER — Ambulatory Visit: Payer: Medicare Other | Admitting: Physical Therapy

## 2015-02-12 DIAGNOSIS — M25662 Stiffness of left knee, not elsewhere classified: Secondary | ICD-10-CM | POA: Diagnosis not present

## 2015-02-12 DIAGNOSIS — Z96652 Presence of left artificial knee joint: Secondary | ICD-10-CM

## 2015-02-12 DIAGNOSIS — M6281 Muscle weakness (generalized): Secondary | ICD-10-CM

## 2015-02-12 DIAGNOSIS — R262 Difficulty in walking, not elsewhere classified: Secondary | ICD-10-CM
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
## 2015-02-17 ENCOUNTER — Ambulatory Visit: Payer: Medicare Other | Admitting: Physical Therapy

## 2015-02-19 ENCOUNTER — Ambulatory Visit: Payer: Medicare Other | Admitting: Physical Therapy

## 2015-02-28 ENCOUNTER — Ambulatory Visit: Payer: Medicare Other | Admitting: Physical Therapy

## 2015-02-28 DIAGNOSIS — Z96652 Presence of left artificial knee joint: Secondary | ICD-10-CM

## 2015-02-28 DIAGNOSIS — M25662 Stiffness of left knee, not elsewhere classified: Secondary | ICD-10-CM | POA: Diagnosis not present

## 2015-02-28 DIAGNOSIS — M6281 Muscle weakness (generalized): Secondary | ICD-10-CM

## 2015-02-28 DIAGNOSIS — R262 Difficulty in walking, not elsewhere classified: Secondary | ICD-10-CM
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
## 2015-02-28 NOTE — Patient Instructions (Signed)
    at gym:  1. Recumbent Bike  2. Seated Stepper  3. Leg Press  4. Knee Extension  5. Hamstring Curls  6. Hip Abduction/Adduction  7. Hip Extension (may not have machine)  [name], PT, DPT 02/28/2015 8:37 AM  [facility] [address] [place] [address]  [phone] (office) [phone] (fax)

## 2015-03-03 ENCOUNTER — Ambulatory Visit: Payer: Medicare Other | Admitting: Physical Therapy

## 2015-03-05 ENCOUNTER — Encounter: Payer: Medicare Other | Admitting: Physical Therapy

## 2015-03-21 DIAGNOSIS — E119 Type 2 diabetes mellitus without complications: Secondary | ICD-10-CM | POA: Diagnosis not present

## 2015-03-21 DIAGNOSIS — E785 Hyperlipidemia, unspecified: Secondary | ICD-10-CM | POA: Diagnosis not present

## 2015-03-21 DIAGNOSIS — Z79899 Other long term (current) drug therapy: Secondary | ICD-10-CM | POA: Diagnosis not present

## 2015-03-21 DIAGNOSIS — Z940 Kidney transplant status: Secondary | ICD-10-CM | POA: Diagnosis not present

## 2015-03-26 DIAGNOSIS — I151 Hypertension secondary to other renal disorders: Secondary | ICD-10-CM | POA: Diagnosis not present

## 2015-03-26 DIAGNOSIS — Z940 Kidney transplant status: Secondary | ICD-10-CM | POA: Diagnosis not present

## 2015-03-26 DIAGNOSIS — N2889 Other specified disorders of kidney and ureter: Secondary | ICD-10-CM | POA: Diagnosis not present

## 2015-03-26 DIAGNOSIS — Z79899 Other long term (current) drug therapy: Secondary | ICD-10-CM | POA: Diagnosis not present

## 2015-03-26 DIAGNOSIS — E785 Hyperlipidemia, unspecified: Secondary | ICD-10-CM | POA: Diagnosis not present

## 2015-03-26 DIAGNOSIS — Z23 Encounter for immunization: Secondary | ICD-10-CM | POA: Diagnosis not present

## 2015-03-26 DIAGNOSIS — E119 Type 2 diabetes mellitus without complications: Secondary | ICD-10-CM | POA: Diagnosis not present

## 2015-04-21 DIAGNOSIS — Z96652 Presence of left artificial knee joint: Secondary | ICD-10-CM | POA: Diagnosis not present

## 2015-04-21 DIAGNOSIS — Z471 Aftercare following joint replacement surgery: Secondary | ICD-10-CM | POA: Diagnosis not present

## 2015-04-21 DIAGNOSIS — M25562 Pain in left knee: Secondary | ICD-10-CM | POA: Diagnosis not present

## 2015-04-21 DIAGNOSIS — G8929 Other chronic pain: Secondary | ICD-10-CM | POA: Diagnosis not present

## 2015-06-13 DIAGNOSIS — N189 Chronic kidney disease, unspecified: Secondary | ICD-10-CM | POA: Diagnosis not present

## 2015-06-13 DIAGNOSIS — H6120 Impacted cerumen, unspecified ear: Secondary | ICD-10-CM | POA: Diagnosis not present

## 2015-06-13 DIAGNOSIS — Z7189 Other specified counseling: Secondary | ICD-10-CM | POA: Diagnosis not present

## 2015-06-13 DIAGNOSIS — Z6827 Body mass index (BMI) 27.0-27.9, adult: Secondary | ICD-10-CM | POA: Diagnosis not present

## 2015-06-13 DIAGNOSIS — I10 Essential (primary) hypertension: Secondary | ICD-10-CM | POA: Diagnosis not present

## 2015-06-20 DIAGNOSIS — Z7982 Long term (current) use of aspirin: Secondary | ICD-10-CM | POA: Diagnosis not present

## 2015-06-20 DIAGNOSIS — G8929 Other chronic pain: Secondary | ICD-10-CM | POA: Diagnosis not present

## 2015-06-20 DIAGNOSIS — I120 Hypertensive chronic kidney disease with stage 5 chronic kidney disease or end stage renal disease: Secondary | ICD-10-CM | POA: Diagnosis not present

## 2015-06-20 DIAGNOSIS — Z794 Long term (current) use of insulin: Secondary | ICD-10-CM | POA: Diagnosis not present

## 2015-06-20 DIAGNOSIS — N186 End stage renal disease: Secondary | ICD-10-CM | POA: Diagnosis not present

## 2015-06-20 DIAGNOSIS — M25562 Pain in left knee: Secondary | ICD-10-CM | POA: Diagnosis not present

## 2015-06-20 DIAGNOSIS — Z886 Allergy status to analgesic agent status: Secondary | ICD-10-CM | POA: Diagnosis not present

## 2015-06-20 DIAGNOSIS — Z79899 Other long term (current) drug therapy: Secondary | ICD-10-CM | POA: Diagnosis not present

## 2015-06-20 DIAGNOSIS — R600 Localized edema: Secondary | ICD-10-CM | POA: Diagnosis not present

## 2015-06-20 DIAGNOSIS — I70292 Other atherosclerosis of native arteries of extremities, left leg: Secondary | ICD-10-CM | POA: Diagnosis not present

## 2015-06-20 DIAGNOSIS — E1122 Type 2 diabetes mellitus with diabetic chronic kidney disease: Secondary | ICD-10-CM | POA: Diagnosis not present

## 2015-06-20 DIAGNOSIS — Z96652 Presence of left artificial knee joint: Secondary | ICD-10-CM | POA: Diagnosis not present

## 2015-06-24 DIAGNOSIS — E119 Type 2 diabetes mellitus without complications: Secondary | ICD-10-CM | POA: Diagnosis not present

## 2015-06-24 DIAGNOSIS — Z79899 Other long term (current) drug therapy: Secondary | ICD-10-CM | POA: Diagnosis not present

## 2015-06-24 DIAGNOSIS — E785 Hyperlipidemia, unspecified: Secondary | ICD-10-CM | POA: Diagnosis not present

## 2015-06-24 DIAGNOSIS — Z940 Kidney transplant status: Secondary | ICD-10-CM | POA: Diagnosis not present

## 2015-06-25 DIAGNOSIS — E119 Type 2 diabetes mellitus without complications: Secondary | ICD-10-CM | POA: Diagnosis not present

## 2015-06-25 DIAGNOSIS — M25569 Pain in unspecified knee: Secondary | ICD-10-CM | POA: Diagnosis not present

## 2015-06-25 DIAGNOSIS — Z79899 Other long term (current) drug therapy: Secondary | ICD-10-CM | POA: Diagnosis not present

## 2015-06-25 DIAGNOSIS — I151 Hypertension secondary to other renal disorders: Secondary | ICD-10-CM | POA: Diagnosis not present

## 2015-06-25 DIAGNOSIS — Z940 Kidney transplant status: Secondary | ICD-10-CM | POA: Diagnosis not present

## 2015-06-25 DIAGNOSIS — I4892 Unspecified atrial flutter: Secondary | ICD-10-CM | POA: Diagnosis not present

## 2015-06-25 DIAGNOSIS — N2889 Other specified disorders of kidney and ureter: Secondary | ICD-10-CM | POA: Diagnosis not present

## 2015-06-27 DIAGNOSIS — R609 Edema, unspecified: Secondary | ICD-10-CM | POA: Diagnosis not present

## 2015-07-02 DIAGNOSIS — Z940 Kidney transplant status: Secondary | ICD-10-CM | POA: Diagnosis not present

## 2015-07-21 DIAGNOSIS — M2540 Effusion, unspecified joint: Secondary | ICD-10-CM | POA: Diagnosis not present

## 2015-07-21 DIAGNOSIS — M25462 Effusion, left knee: Secondary | ICD-10-CM | POA: Diagnosis not present

## 2015-07-21 DIAGNOSIS — Z471 Aftercare following joint replacement surgery: Secondary | ICD-10-CM | POA: Diagnosis not present

## 2015-07-21 DIAGNOSIS — Z96652 Presence of left artificial knee joint: Secondary | ICD-10-CM | POA: Diagnosis not present

## 2015-07-21 DIAGNOSIS — I7090 Unspecified atherosclerosis: Secondary | ICD-10-CM | POA: Diagnosis not present

## 2015-09-03 DIAGNOSIS — Z7982 Long term (current) use of aspirin: Secondary | ICD-10-CM | POA: Diagnosis not present

## 2015-09-03 DIAGNOSIS — I120 Hypertensive chronic kidney disease with stage 5 chronic kidney disease or end stage renal disease: Secondary | ICD-10-CM | POA: Diagnosis not present

## 2015-09-03 DIAGNOSIS — Z7902 Long term (current) use of antithrombotics/antiplatelets: Secondary | ICD-10-CM | POA: Diagnosis not present

## 2015-09-03 DIAGNOSIS — E1122 Type 2 diabetes mellitus with diabetic chronic kidney disease: Secondary | ICD-10-CM | POA: Diagnosis not present

## 2015-09-03 DIAGNOSIS — N186 End stage renal disease: Secondary | ICD-10-CM | POA: Diagnosis not present

## 2015-09-03 DIAGNOSIS — Z794 Long term (current) use of insulin: Secondary | ICD-10-CM | POA: Diagnosis not present

## 2015-09-03 DIAGNOSIS — Z886 Allergy status to analgesic agent status: Secondary | ICD-10-CM | POA: Diagnosis not present

## 2015-09-03 DIAGNOSIS — Z79899 Other long term (current) drug therapy: Secondary | ICD-10-CM | POA: Diagnosis not present

## 2015-09-03 DIAGNOSIS — Z96652 Presence of left artificial knee joint: Secondary | ICD-10-CM | POA: Diagnosis not present

## 2015-09-03 DIAGNOSIS — T8454XA Infection and inflammatory reaction due to internal left knee prosthesis, initial encounter: Secondary | ICD-10-CM | POA: Diagnosis not present

## 2015-09-03 DIAGNOSIS — I2510 Atherosclerotic heart disease of native coronary artery without angina pectoris: Secondary | ICD-10-CM | POA: Diagnosis not present

## 2015-09-18 DIAGNOSIS — Z940 Kidney transplant status: Secondary | ICD-10-CM | POA: Diagnosis not present

## 2015-09-18 DIAGNOSIS — E119 Type 2 diabetes mellitus without complications: Secondary | ICD-10-CM | POA: Diagnosis not present

## 2015-09-18 DIAGNOSIS — Z79899 Other long term (current) drug therapy: Secondary | ICD-10-CM | POA: Diagnosis not present

## 2015-09-18 DIAGNOSIS — E785 Hyperlipidemia, unspecified: Secondary | ICD-10-CM | POA: Diagnosis not present

## 2015-09-19 DIAGNOSIS — E119 Type 2 diabetes mellitus without complications: Secondary | ICD-10-CM | POA: Diagnosis not present

## 2015-09-19 DIAGNOSIS — Z940 Kidney transplant status: Secondary | ICD-10-CM | POA: Diagnosis not present

## 2015-09-19 DIAGNOSIS — E785 Hyperlipidemia, unspecified: Secondary | ICD-10-CM | POA: Diagnosis not present

## 2015-09-19 DIAGNOSIS — N2889 Other specified disorders of kidney and ureter: Secondary | ICD-10-CM | POA: Diagnosis not present

## 2015-09-19 DIAGNOSIS — Z79899 Other long term (current) drug therapy: Secondary | ICD-10-CM | POA: Diagnosis not present

## 2015-09-19 DIAGNOSIS — I151 Hypertension secondary to other renal disorders: Secondary | ICD-10-CM | POA: Diagnosis not present

## 2015-12-19 DIAGNOSIS — E119 Type 2 diabetes mellitus without complications: Secondary | ICD-10-CM | POA: Diagnosis not present

## 2015-12-19 DIAGNOSIS — R609 Edema, unspecified: Secondary | ICD-10-CM | POA: Diagnosis not present

## 2015-12-19 DIAGNOSIS — G8929 Other chronic pain: Secondary | ICD-10-CM | POA: Diagnosis not present

## 2015-12-19 DIAGNOSIS — I10 Essential (primary) hypertension: Secondary | ICD-10-CM | POA: Diagnosis not present

## 2015-12-24 DIAGNOSIS — Z79899 Other long term (current) drug therapy: Secondary | ICD-10-CM | POA: Diagnosis not present

## 2015-12-24 DIAGNOSIS — E119 Type 2 diabetes mellitus without complications: Secondary | ICD-10-CM | POA: Diagnosis not present

## 2015-12-24 DIAGNOSIS — E785 Hyperlipidemia, unspecified: Secondary | ICD-10-CM | POA: Diagnosis not present

## 2015-12-24 DIAGNOSIS — Z940 Kidney transplant status: Secondary | ICD-10-CM | POA: Diagnosis not present

## 2015-12-25 DIAGNOSIS — Z79899 Other long term (current) drug therapy: Secondary | ICD-10-CM | POA: Diagnosis not present

## 2015-12-25 DIAGNOSIS — E119 Type 2 diabetes mellitus without complications: Secondary | ICD-10-CM | POA: Diagnosis not present

## 2015-12-25 DIAGNOSIS — E785 Hyperlipidemia, unspecified: Secondary | ICD-10-CM | POA: Diagnosis not present

## 2015-12-25 DIAGNOSIS — Z940 Kidney transplant status: Secondary | ICD-10-CM | POA: Diagnosis not present

## 2015-12-25 DIAGNOSIS — I151 Hypertension secondary to other renal disorders: Secondary | ICD-10-CM | POA: Diagnosis not present

## 2015-12-25 DIAGNOSIS — N2889 Other specified disorders of kidney and ureter: Secondary | ICD-10-CM | POA: Diagnosis not present

## 2016-02-16 DIAGNOSIS — I2510 Atherosclerotic heart disease of native coronary artery without angina pectoris: Secondary | ICD-10-CM | POA: Diagnosis not present

## 2016-02-16 DIAGNOSIS — E1122 Type 2 diabetes mellitus with diabetic chronic kidney disease: Secondary | ICD-10-CM | POA: Diagnosis not present

## 2016-02-16 DIAGNOSIS — I4891 Unspecified atrial fibrillation: Secondary | ICD-10-CM | POA: Diagnosis not present

## 2016-02-16 DIAGNOSIS — N186 End stage renal disease: Secondary | ICD-10-CM | POA: Diagnosis not present

## 2016-02-16 DIAGNOSIS — Z471 Aftercare following joint replacement surgery: Secondary | ICD-10-CM | POA: Diagnosis not present

## 2016-02-16 DIAGNOSIS — I120 Hypertensive chronic kidney disease with stage 5 chronic kidney disease or end stage renal disease: Secondary | ICD-10-CM | POA: Diagnosis not present

## 2016-02-16 DIAGNOSIS — Z7901 Long term (current) use of anticoagulants: Secondary | ICD-10-CM | POA: Diagnosis not present

## 2016-02-16 DIAGNOSIS — Z96652 Presence of left artificial knee joint: Secondary | ICD-10-CM | POA: Diagnosis not present

## 2016-03-03 DIAGNOSIS — Z940 Kidney transplant status: Secondary | ICD-10-CM | POA: Diagnosis not present

## 2016-03-03 DIAGNOSIS — D631 Anemia in chronic kidney disease: Secondary | ICD-10-CM | POA: Diagnosis not present

## 2016-03-03 DIAGNOSIS — E785 Hyperlipidemia, unspecified: Secondary | ICD-10-CM | POA: Diagnosis not present

## 2016-03-03 DIAGNOSIS — E119 Type 2 diabetes mellitus without complications: Secondary | ICD-10-CM | POA: Diagnosis not present

## 2016-03-12 DIAGNOSIS — E119 Type 2 diabetes mellitus without complications: Secondary | ICD-10-CM | POA: Diagnosis not present

## 2016-03-12 DIAGNOSIS — I4892 Unspecified atrial flutter: Secondary | ICD-10-CM | POA: Diagnosis not present

## 2016-03-12 DIAGNOSIS — N2889 Other specified disorders of kidney and ureter: Secondary | ICD-10-CM | POA: Diagnosis not present

## 2016-03-12 DIAGNOSIS — Z940 Kidney transplant status: Secondary | ICD-10-CM | POA: Diagnosis not present

## 2016-03-12 DIAGNOSIS — I151 Hypertension secondary to other renal disorders: Secondary | ICD-10-CM | POA: Diagnosis not present

## 2016-03-12 DIAGNOSIS — Z23 Encounter for immunization: Secondary | ICD-10-CM | POA: Diagnosis not present

## 2016-03-12 DIAGNOSIS — Z79899 Other long term (current) drug therapy: Secondary | ICD-10-CM | POA: Diagnosis not present

## 2016-05-17 DIAGNOSIS — H26491 Other secondary cataract, right eye: Secondary | ICD-10-CM | POA: Diagnosis not present

## 2016-05-17 DIAGNOSIS — Z961 Presence of intraocular lens: Secondary | ICD-10-CM | POA: Diagnosis not present

## 2016-05-17 DIAGNOSIS — H26492 Other secondary cataract, left eye: Secondary | ICD-10-CM | POA: Diagnosis not present

## 2016-05-17 DIAGNOSIS — E119 Type 2 diabetes mellitus without complications: Secondary | ICD-10-CM | POA: Diagnosis not present

## 2016-06-16 DIAGNOSIS — E1122 Type 2 diabetes mellitus with diabetic chronic kidney disease: Secondary | ICD-10-CM | POA: Diagnosis not present

## 2016-06-16 DIAGNOSIS — N2889 Other specified disorders of kidney and ureter: Secondary | ICD-10-CM | POA: Diagnosis not present

## 2016-06-16 DIAGNOSIS — I120 Hypertensive chronic kidney disease with stage 5 chronic kidney disease or end stage renal disease: Secondary | ICD-10-CM | POA: Diagnosis not present

## 2016-06-16 DIAGNOSIS — B1920 Unspecified viral hepatitis C without hepatic coma: Secondary | ICD-10-CM | POA: Diagnosis not present

## 2016-06-16 DIAGNOSIS — Z79899 Other long term (current) drug therapy: Secondary | ICD-10-CM | POA: Diagnosis not present

## 2016-06-16 DIAGNOSIS — Z794 Long term (current) use of insulin: Secondary | ICD-10-CM | POA: Diagnosis not present

## 2016-06-16 DIAGNOSIS — Z7982 Long term (current) use of aspirin: Secondary | ICD-10-CM | POA: Diagnosis not present

## 2016-06-16 DIAGNOSIS — I4891 Unspecified atrial fibrillation: Secondary | ICD-10-CM | POA: Diagnosis not present

## 2016-06-16 DIAGNOSIS — I1311 Hypertensive heart and chronic kidney disease without heart failure, with stage 5 chronic kidney disease, or end stage renal disease: Secondary | ICD-10-CM | POA: Diagnosis not present

## 2016-06-16 DIAGNOSIS — D899 Disorder involving the immune mechanism, unspecified: Secondary | ICD-10-CM | POA: Diagnosis not present

## 2016-06-16 DIAGNOSIS — Z4822 Encounter for aftercare following kidney transplant: Secondary | ICD-10-CM | POA: Diagnosis not present

## 2016-06-16 DIAGNOSIS — Z940 Kidney transplant status: Secondary | ICD-10-CM | POA: Diagnosis not present

## 2016-06-16 DIAGNOSIS — I2510 Atherosclerotic heart disease of native coronary artery without angina pectoris: Secondary | ICD-10-CM | POA: Diagnosis not present

## 2016-06-16 DIAGNOSIS — Z96651 Presence of right artificial knee joint: Secondary | ICD-10-CM | POA: Diagnosis not present

## 2016-06-16 DIAGNOSIS — N186 End stage renal disease: Secondary | ICD-10-CM | POA: Diagnosis not present

## 2016-06-16 DIAGNOSIS — E785 Hyperlipidemia, unspecified: Secondary | ICD-10-CM | POA: Diagnosis not present

## 2016-06-17 DIAGNOSIS — M25569 Pain in unspecified knee: Secondary | ICD-10-CM | POA: Diagnosis not present

## 2016-06-17 DIAGNOSIS — K56609 Unspecified intestinal obstruction, unspecified as to partial versus complete obstruction: Secondary | ICD-10-CM | POA: Diagnosis not present

## 2016-06-17 DIAGNOSIS — I151 Hypertension secondary to other renal disorders: Secondary | ICD-10-CM | POA: Diagnosis not present

## 2016-06-17 DIAGNOSIS — E785 Hyperlipidemia, unspecified: Secondary | ICD-10-CM | POA: Diagnosis not present

## 2016-06-17 DIAGNOSIS — N529 Male erectile dysfunction, unspecified: Secondary | ICD-10-CM | POA: Diagnosis not present

## 2016-06-17 DIAGNOSIS — N2889 Other specified disorders of kidney and ureter: Secondary | ICD-10-CM | POA: Diagnosis not present

## 2016-06-17 DIAGNOSIS — Z940 Kidney transplant status: Secondary | ICD-10-CM | POA: Diagnosis not present

## 2016-06-17 DIAGNOSIS — G8929 Other chronic pain: Secondary | ICD-10-CM | POA: Diagnosis not present

## 2016-06-17 DIAGNOSIS — Z79899 Other long term (current) drug therapy: Secondary | ICD-10-CM | POA: Diagnosis not present

## 2016-06-17 DIAGNOSIS — Z6830 Body mass index (BMI) 30.0-30.9, adult: Secondary | ICD-10-CM | POA: Diagnosis not present

## 2016-06-17 DIAGNOSIS — E119 Type 2 diabetes mellitus without complications: Secondary | ICD-10-CM | POA: Diagnosis not present

## 2016-06-18 DIAGNOSIS — G8929 Other chronic pain: Secondary | ICD-10-CM | POA: Diagnosis not present

## 2016-06-18 DIAGNOSIS — H6120 Impacted cerumen, unspecified ear: Secondary | ICD-10-CM | POA: Diagnosis not present

## 2016-06-18 DIAGNOSIS — I10 Essential (primary) hypertension: Secondary | ICD-10-CM | POA: Diagnosis not present

## 2016-06-18 DIAGNOSIS — E119 Type 2 diabetes mellitus without complications: Secondary | ICD-10-CM | POA: Diagnosis not present

## 2016-07-07 DIAGNOSIS — E139 Other specified diabetes mellitus without complications: Secondary | ICD-10-CM | POA: Diagnosis not present

## 2016-07-07 DIAGNOSIS — G8929 Other chronic pain: Secondary | ICD-10-CM | POA: Diagnosis not present

## 2016-07-07 DIAGNOSIS — I10 Essential (primary) hypertension: Secondary | ICD-10-CM | POA: Diagnosis not present

## 2016-08-07 DIAGNOSIS — E139 Other specified diabetes mellitus without complications: Secondary | ICD-10-CM | POA: Diagnosis not present

## 2016-08-07 DIAGNOSIS — G8929 Other chronic pain: Secondary | ICD-10-CM | POA: Diagnosis not present

## 2016-09-06 DIAGNOSIS — G8929 Other chronic pain: Secondary | ICD-10-CM | POA: Diagnosis not present

## 2016-09-06 DIAGNOSIS — E118 Type 2 diabetes mellitus with unspecified complications: Secondary | ICD-10-CM | POA: Diagnosis not present

## 2016-09-14 DIAGNOSIS — Z940 Kidney transplant status: Secondary | ICD-10-CM | POA: Diagnosis not present

## 2016-09-14 DIAGNOSIS — N390 Urinary tract infection, site not specified: Secondary | ICD-10-CM | POA: Diagnosis not present

## 2016-09-14 DIAGNOSIS — E119 Type 2 diabetes mellitus without complications: Secondary | ICD-10-CM | POA: Diagnosis not present

## 2016-09-14 DIAGNOSIS — E785 Hyperlipidemia, unspecified: Secondary | ICD-10-CM | POA: Diagnosis not present

## 2016-09-15 DIAGNOSIS — E119 Type 2 diabetes mellitus without complications: Secondary | ICD-10-CM | POA: Diagnosis not present

## 2016-09-15 DIAGNOSIS — Z79899 Other long term (current) drug therapy: Secondary | ICD-10-CM | POA: Diagnosis not present

## 2016-09-15 DIAGNOSIS — K56609 Unspecified intestinal obstruction, unspecified as to partial versus complete obstruction: Secondary | ICD-10-CM | POA: Diagnosis not present

## 2016-09-15 DIAGNOSIS — G8929 Other chronic pain: Secondary | ICD-10-CM | POA: Diagnosis not present

## 2016-09-15 DIAGNOSIS — N2889 Other specified disorders of kidney and ureter: Secondary | ICD-10-CM | POA: Diagnosis not present

## 2016-09-15 DIAGNOSIS — Z940 Kidney transplant status: Secondary | ICD-10-CM | POA: Diagnosis not present

## 2016-09-15 DIAGNOSIS — E785 Hyperlipidemia, unspecified: Secondary | ICD-10-CM | POA: Diagnosis not present

## 2016-09-15 DIAGNOSIS — I151 Hypertension secondary to other renal disorders: Secondary | ICD-10-CM | POA: Diagnosis not present

## 2016-09-15 DIAGNOSIS — N529 Male erectile dysfunction, unspecified: Secondary | ICD-10-CM | POA: Diagnosis not present

## 2016-09-15 DIAGNOSIS — I4892 Unspecified atrial flutter: Secondary | ICD-10-CM | POA: Diagnosis not present

## 2016-09-15 DIAGNOSIS — M25569 Pain in unspecified knee: Secondary | ICD-10-CM | POA: Diagnosis not present

## 2016-11-03 ENCOUNTER — Encounter (HOSPITAL_COMMUNITY): Payer: Self-pay

## 2016-11-03 ENCOUNTER — Observation Stay (HOSPITAL_COMMUNITY)
Admission: EM | Admit: 2016-11-03 | Discharge: 2016-11-04 | Disposition: A | Discharge status: Home / Self Care | Payer: Medicare Other | Attending: Internal Medicine | Admitting: Internal Medicine

## 2016-11-03 ENCOUNTER — Emergency Department (HOSPITAL_COMMUNITY): Payer: Medicare Other

## 2016-11-03 DIAGNOSIS — Z96651 Presence of right artificial knee joint: Secondary | ICD-10-CM | POA: Diagnosis not present

## 2016-11-03 DIAGNOSIS — Z940 Kidney transplant status: Secondary | ICD-10-CM | POA: Diagnosis not present

## 2016-11-03 DIAGNOSIS — I482 Chronic atrial fibrillation, unspecified: Secondary | ICD-10-CM | POA: Diagnosis present

## 2016-11-03 DIAGNOSIS — Z79899 Other long term (current) drug therapy: Secondary | ICD-10-CM | POA: Insufficient documentation

## 2016-11-03 DIAGNOSIS — Z905 Acquired absence of kidney: Secondary | ICD-10-CM | POA: Diagnosis not present

## 2016-11-03 DIAGNOSIS — N179 Acute kidney failure, unspecified: Secondary | ICD-10-CM | POA: Insufficient documentation

## 2016-11-03 DIAGNOSIS — I5032 Chronic diastolic (congestive) heart failure: Secondary | ICD-10-CM | POA: Diagnosis not present

## 2016-11-03 DIAGNOSIS — Z96652 Presence of left artificial knee joint: Secondary | ICD-10-CM

## 2016-11-03 DIAGNOSIS — R079 Chest pain, unspecified: Principal | ICD-10-CM | POA: Diagnosis present

## 2016-11-03 DIAGNOSIS — G894 Chronic pain syndrome: Secondary | ICD-10-CM | POA: Diagnosis not present

## 2016-11-03 DIAGNOSIS — I4892 Unspecified atrial flutter: Secondary | ICD-10-CM | POA: Diagnosis present

## 2016-11-03 DIAGNOSIS — E876 Hypokalemia: Secondary | ICD-10-CM | POA: Diagnosis present

## 2016-11-03 DIAGNOSIS — Z7982 Long term (current) use of aspirin: Secondary | ICD-10-CM | POA: Insufficient documentation

## 2016-11-03 DIAGNOSIS — Z8249 Family history of ischemic heart disease and other diseases of the circulatory system: Secondary | ICD-10-CM | POA: Diagnosis not present

## 2016-11-03 DIAGNOSIS — Z09 Encounter for follow-up examination after completed treatment for conditions other than malignant neoplasm: Secondary | ICD-10-CM | POA: Diagnosis not present

## 2016-11-03 DIAGNOSIS — K219 Gastro-esophageal reflux disease without esophagitis: Secondary | ICD-10-CM | POA: Diagnosis not present

## 2016-11-03 DIAGNOSIS — E1165 Type 2 diabetes mellitus with hyperglycemia: Secondary | ICD-10-CM | POA: Insufficient documentation

## 2016-11-03 DIAGNOSIS — Z794 Long term (current) use of insulin: Secondary | ICD-10-CM | POA: Insufficient documentation

## 2016-11-03 DIAGNOSIS — I132 Hypertensive heart and chronic kidney disease with heart failure and with stage 5 chronic kidney disease, or end stage renal disease: Secondary | ICD-10-CM | POA: Diagnosis not present

## 2016-11-03 DIAGNOSIS — I4891 Unspecified atrial fibrillation: Secondary | ICD-10-CM | POA: Diagnosis not present

## 2016-11-03 DIAGNOSIS — IMO0002 Reserved for concepts with insufficient information to code with codable children: Secondary | ICD-10-CM | POA: Diagnosis present

## 2016-11-03 DIAGNOSIS — R0602 Shortness of breath: Secondary | ICD-10-CM | POA: Diagnosis not present

## 2016-11-03 DIAGNOSIS — E785 Hyperlipidemia, unspecified: Secondary | ICD-10-CM | POA: Diagnosis not present

## 2016-11-03 DIAGNOSIS — E1122 Type 2 diabetes mellitus with diabetic chronic kidney disease: Secondary | ICD-10-CM | POA: Insufficient documentation

## 2016-11-03 DIAGNOSIS — N186 End stage renal disease: Secondary | ICD-10-CM | POA: Diagnosis not present

## 2016-11-03 DIAGNOSIS — I083 Combined rheumatic disorders of mitral, aortic and tricuspid valves: Secondary | ICD-10-CM | POA: Insufficient documentation

## 2016-11-03 DIAGNOSIS — I700 Atherosclerosis of aorta: Secondary | ICD-10-CM | POA: Insufficient documentation

## 2016-11-03 DIAGNOSIS — R9431 Abnormal electrocardiogram [ECG] [EKG]: Secondary | ICD-10-CM | POA: Diagnosis not present

## 2016-11-03 DIAGNOSIS — Z800 Family history of malignant neoplasm of digestive organs: Secondary | ICD-10-CM | POA: Insufficient documentation

## 2016-11-03 DIAGNOSIS — I491 Atrial premature depolarization: Secondary | ICD-10-CM | POA: Diagnosis not present

## 2016-11-03 DIAGNOSIS — Z885 Allergy status to narcotic agent status: Secondary | ICD-10-CM | POA: Diagnosis not present

## 2016-11-03 DIAGNOSIS — I10 Essential (primary) hypertension: Secondary | ICD-10-CM | POA: Diagnosis present

## 2016-11-03 DIAGNOSIS — R0789 Other chest pain: Secondary | ICD-10-CM | POA: Diagnosis not present

## 2016-11-03 LAB — CBC
HCT: 42.6 % (ref 39.0–52.0)
Hemoglobin: 13.7 g/dL (ref 13.0–17.0)
MCH: 26.6 pg (ref 26.0–34.0)
MCHC: 32.2 g/dL (ref 30.0–36.0)
MCV: 82.7 fL (ref 78.0–100.0)
Platelets: 185 10*3/uL (ref 150–400)
RBC: 5.15 MIL/uL (ref 4.22–5.81)
RDW: 14.2 % (ref 11.5–15.5)
WBC: 6.7 10*3/uL (ref 4.0–10.5)

## 2016-11-03 LAB — GLUCOSE, CAPILLARY: Glucose-Capillary: 207 mg/dL — ABNORMAL HIGH (ref 65–99)

## 2016-11-03 LAB — COMPREHENSIVE METABOLIC PANEL
ALT: 14 U/L — ABNORMAL LOW (ref 17–63)
AST: 21 U/L (ref 15–41)
Albumin: 3.6 g/dL (ref 3.5–5.0)
Alkaline Phosphatase: 81 U/L (ref 38–126)
Anion gap: 9 (ref 5–15)
BUN: 22 mg/dL — ABNORMAL HIGH (ref 6–20)
CO2: 26 mmol/L (ref 22–32)
Calcium: 9.5 mg/dL (ref 8.9–10.3)
Chloride: 102 mmol/L (ref 101–111)
Creatinine, Ser: 1.68 mg/dL — ABNORMAL HIGH (ref 0.61–1.24)
GFR calc Af Amer: 49 mL/min — ABNORMAL LOW (ref >60)
GFR calc non Af Amer: 43 mL/min — ABNORMAL LOW (ref >60)
Glucose, Bld: 131 mg/dL — ABNORMAL HIGH (ref 65–99)
Potassium: 3.4 mmol/L — ABNORMAL LOW (ref 3.5–5.1)
Sodium: 137 mmol/L (ref 135–145)
Total Bilirubin: 0.6 mg/dL (ref 0.3–1.2)
Total Protein: 6.2 g/dL — ABNORMAL LOW (ref 6.5–8.1)

## 2016-11-03 LAB — I-STAT TROPONIN, ED: Troponin i, poc: 0.01 ng/mL (ref 0.00–0.08)

## 2016-11-03 LAB — MAGNESIUM: Magnesium: 1.8 mg/dL (ref 1.7–2.4)

## 2016-11-03 MED ORDER — CLONIDINE HCL 0.3 MG PO TABS
0.3000 mg | ORAL_TABLET | Freq: Three times a day (TID) | ORAL | Status: DC
  Administered 2016-11-03 – 2016-11-04 (×3): 0.3 mg via ORAL
  Filled 2016-11-03 (×3): qty 1

## 2016-11-03 MED ORDER — AMIODARONE HCL 200 MG PO TABS
200.0000 mg | ORAL_TABLET | Freq: Every day | ORAL | Status: DC
  Administered 2016-11-04: 200 mg via ORAL
  Filled 2016-11-03: qty 1

## 2016-11-03 MED ORDER — MAGNESIUM OXIDE 400 MG PO TABS: 400.0000 mg | ORAL_TABLET | Freq: Every day | ORAL | Status: DC

## 2016-11-03 MED ORDER — PREGABALIN 100 MG PO CAPS
100.0000 mg | ORAL_CAPSULE | Freq: Two times a day (BID) | ORAL | Status: DC
  Administered 2016-11-03 – 2016-11-04 (×2): 100 mg via ORAL
  Filled 2016-11-03 (×2): qty 1

## 2016-11-03 MED ORDER — POTASSIUM CHLORIDE CRYS ER 20 MEQ PO TBCR
30.0000 meq | EXTENDED_RELEASE_TABLET | Freq: Once | ORAL | Status: AC
  Administered 2016-11-03: 30 meq via ORAL
  Filled 2016-11-03: qty 1

## 2016-11-03 MED ORDER — POLYSACCHARIDE IRON COMPLEX 150 MG PO CAPS
150.0000 mg | ORAL_CAPSULE | Freq: Every day | ORAL | Status: DC
  Administered 2016-11-04: 150 mg via ORAL
  Filled 2016-11-03: qty 1

## 2016-11-03 MED ORDER — FUROSEMIDE 40 MG PO TABS
40.0000 mg | ORAL_TABLET | Freq: Every day | ORAL | Status: DC
  Administered 2016-11-04: 40 mg via ORAL
  Filled 2016-11-03: qty 1

## 2016-11-03 MED ORDER — CARVEDILOL 25 MG PO TABS
25.0000 mg | ORAL_TABLET | Freq: Two times a day (BID) | ORAL | Status: DC
  Administered 2016-11-04 (×2): 25 mg via ORAL
  Filled 2016-11-03 (×2): qty 1

## 2016-11-03 MED ORDER — ONDANSETRON HCL 4 MG/2ML IJ SOLN: 4.0000 mg | Freq: Four times a day (QID) | INTRAMUSCULAR | Status: DC | PRN

## 2016-11-03 MED ORDER — INSULIN ASPART PROT & ASPART (70-30 MIX) 100 UNIT/ML ~~LOC~~ SUSP
25.0000 [IU] | Freq: Two times a day (BID) | SUBCUTANEOUS | Status: DC
  Administered 2016-11-04 (×2): 35 [IU] via SUBCUTANEOUS
  Filled 2016-11-03: qty 10

## 2016-11-03 MED ORDER — MAGNESIUM SULFATE 2 GM/50ML IV SOLN
2.0000 g | Freq: Once | INTRAVENOUS | Status: AC
  Administered 2016-11-03: 2 g via INTRAVENOUS
  Filled 2016-11-03: qty 50

## 2016-11-03 MED ORDER — MYCOPHENOLATE SODIUM 180 MG PO TBEC
540.0000 mg | DELAYED_RELEASE_TABLET | Freq: Two times a day (BID) | ORAL | Status: DC
  Administered 2016-11-03 – 2016-11-04 (×2): 540 mg via ORAL
  Filled 2016-11-03 (×3): qty 3

## 2016-11-03 MED ORDER — ASPIRIN 81 MG PO CHEW
81.0000 mg | CHEWABLE_TABLET | Freq: Every day | ORAL | Status: DC
  Administered 2016-11-04: 81 mg via ORAL
  Filled 2016-11-03: qty 1

## 2016-11-03 MED ORDER — HEPARIN SODIUM (PORCINE) 5000 UNIT/ML IJ SOLN: 5000.0000 [IU] | Freq: Three times a day (TID) | INTRAMUSCULAR | Status: DC

## 2016-11-03 MED ORDER — NIFEDIPINE ER 60 MG PO TB24
60.0000 mg | ORAL_TABLET | Freq: Every day | ORAL | Status: DC
  Administered 2016-11-04: 60 mg via ORAL
  Filled 2016-11-03: qty 1

## 2016-11-03 MED ORDER — MAGNESIUM OXIDE 400 (241.3 MG) MG PO TABS
400.0000 mg | ORAL_TABLET | Freq: Every day | ORAL | Status: DC
  Administered 2016-11-04: 400 mg via ORAL
  Filled 2016-11-03: qty 1

## 2016-11-03 MED ORDER — INSULIN ASPART 100 UNIT/ML ~~LOC~~ SOLN: 0.0000 [IU] | Freq: Three times a day (TID) | SUBCUTANEOUS | Status: DC

## 2016-11-03 MED ORDER — TACROLIMUS 1 MG PO CAPS
2.0000 mg | ORAL_CAPSULE | Freq: Two times a day (BID) | ORAL | Status: DC
  Administered 2016-11-03 – 2016-11-04 (×2): 2 mg via ORAL
  Filled 2016-11-03 (×2): qty 2

## 2016-11-03 MED ORDER — SODIUM BICARBONATE 650 MG PO TABS
650.0000 mg | ORAL_TABLET | Freq: Two times a day (BID) | ORAL | Status: DC
  Administered 2016-11-03 – 2016-11-04 (×2): 650 mg via ORAL
  Filled 2016-11-03 (×2): qty 1

## 2016-11-03 MED ORDER — PRAVASTATIN SODIUM 40 MG PO TABS: 40.0000 mg | ORAL_TABLET | ORAL | Status: DC

## 2016-11-03 MED ORDER — HEPARIN SODIUM (PORCINE) 5000 UNIT/ML IJ SOLN
5000.0000 [IU] | Freq: Three times a day (TID) | INTRAMUSCULAR | Status: DC
  Administered 2016-11-03 – 2016-11-04 (×3): 5000 [IU] via SUBCUTANEOUS
  Filled 2016-11-03 (×4): qty 1

## 2016-11-03 NOTE — ED Notes (Signed)
IV team at bedside 

## 2016-11-03 NOTE — ED Notes (Signed)
Phlebotomy at the bedside  

## 2016-11-03 NOTE — ED Triage Notes (Signed)
Per EMS, patient coming from a clinic complaining of sharp, mid chest pain x 2 days.  Hurts worse when moving around. Also complaining of slight SOB.  Clear lung sounds.  Denies N/V/Dizziness.  Hx of Hypertension and CKD.  Kidney transplant in 2015.  Graft in [place].  No IV access.  12 Lead unremarkable.  324 aspirin and 1 nitro given prior to EMS.  States that it did not help pain.  152/92, HR 74, 97% RA, 16 RR.  Ambulates with cane.
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
## 2016-11-04 ENCOUNTER — Observation Stay (HOSPITAL_COMMUNITY): Payer: Medicare Other

## 2016-11-04 DIAGNOSIS — G458 Other transient cerebral ischemic attacks and related syndromes: Secondary | ICD-10-CM | POA: Diagnosis not present

## 2016-11-04 DIAGNOSIS — I4891 Unspecified atrial fibrillation: Secondary | ICD-10-CM | POA: Diagnosis not present

## 2016-11-04 DIAGNOSIS — R079 Chest pain, unspecified: Secondary | ICD-10-CM | POA: Diagnosis not present

## 2016-11-04 DIAGNOSIS — I5032 Chronic diastolic (congestive) heart failure: Secondary | ICD-10-CM

## 2016-11-04 DIAGNOSIS — E1122 Type 2 diabetes mellitus with diabetic chronic kidney disease: Secondary | ICD-10-CM | POA: Diagnosis not present

## 2016-11-04 DIAGNOSIS — E1165 Type 2 diabetes mellitus with hyperglycemia: Secondary | ICD-10-CM

## 2016-11-04 DIAGNOSIS — E785 Hyperlipidemia, unspecified: Secondary | ICD-10-CM | POA: Diagnosis not present

## 2016-11-04 DIAGNOSIS — N183 Chronic kidney disease, stage 3 (moderate): Secondary | ICD-10-CM

## 2016-11-04 DIAGNOSIS — E876 Hypokalemia: Secondary | ICD-10-CM | POA: Diagnosis not present

## 2016-11-04 DIAGNOSIS — I10 Essential (primary) hypertension: Secondary | ICD-10-CM | POA: Diagnosis not present

## 2016-11-04 DIAGNOSIS — Z940 Kidney transplant status: Secondary | ICD-10-CM | POA: Diagnosis not present

## 2016-11-04 DIAGNOSIS — Z794 Long term (current) use of insulin: Secondary | ICD-10-CM

## 2016-11-04 LAB — GLUCOSE, CAPILLARY
Glucose-Capillary: 206 mg/dL — ABNORMAL HIGH (ref 65–99)
Glucose-Capillary: 165 mg/dL — ABNORMAL HIGH (ref 65–99)
Glucose-Capillary: 201 mg/dL — ABNORMAL HIGH (ref 65–99)

## 2016-11-04 LAB — BASIC METABOLIC PANEL
Anion gap: 10 (ref 5–15)
BUN: 19 mg/dL (ref 6–20)
CO2: 25 mmol/L (ref 22–32)
Calcium: 9.5 mg/dL (ref 8.9–10.3)
Chloride: 103 mmol/L (ref 101–111)
Creatinine, Ser: 1.51 mg/dL — ABNORMAL HIGH (ref 0.61–1.24)
GFR calc Af Amer: 56 mL/min — ABNORMAL LOW (ref >60)
GFR calc non Af Amer: 49 mL/min — ABNORMAL LOW (ref >60)
Glucose, Bld: 239 mg/dL — ABNORMAL HIGH (ref 65–99)
Potassium: 3.8 mmol/L (ref 3.5–5.1)
Sodium: 138 mmol/L (ref 135–145)

## 2016-11-04 LAB — HIV ANTIBODY (ROUTINE TESTING W REFLEX): HIV Screen 4th Generation wRfx: NONREACTIVE

## 2016-11-04 LAB — TROPONIN I
Troponin I: 0.06 ng/mL (ref <0.03)
Troponin I: <0.03 ng/mL (ref <0.03)

## 2016-11-04 LAB — ECHOCARDIOGRAM COMPLETE
Height: 70 in
Weight: 3334.4 oz

## 2016-11-04 MED ORDER — PANTOPRAZOLE SODIUM 40 MG PO TBEC
40.0000 mg | DELAYED_RELEASE_TABLET | Freq: Every day | ORAL | Status: DC
  Administered 2016-11-04: 40 mg via ORAL
  Filled 2016-11-04: qty 1

## 2016-11-04 MED ORDER — PANTOPRAZOLE SODIUM 40 MG PO TBEC: 40.0000 mg | DELAYED_RELEASE_TABLET | Freq: Every day | ORAL | 1 refills | Status: DC

## 2016-11-04 MED ORDER — METHADONE HCL 10 MG PO TABS
95.0000 mg | ORAL_TABLET | Freq: Every day | ORAL | Status: DC
  Administered 2016-11-04: 95 mg via ORAL
  Filled 2016-11-04: qty 10

## 2016-11-04 NOTE — Progress Notes (Signed)
CRITICAL VALUE ALERT  Critical Value:  Troponin 0.06  Date & Time Notied:  11/04/16 0145  Provider Notified: Tylene Fantasia  Orders Received/Actions taken: awaiting

## 2016-11-04 NOTE — Progress Notes (Signed)
  Echocardiogram 2D Echocardiogram has been performed.  [name] 11/04/2016, 1:14 PM

## 2016-11-04 NOTE — Progress Notes (Signed)
Pt's gets methadone treatment at [facility]. Methadone dose is confirmed as 95mg  daily, most recent dose was received on 11/03/2016. Dr. Dyann Kief notified. Will order maintenance dose while in the hospital.   Maryanna Shape, PharmD, BCPS  Clinical Pharmacist  Pager: [phone]
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
## 2016-12-07 DIAGNOSIS — E118 Type 2 diabetes mellitus with unspecified complications: Secondary | ICD-10-CM | POA: Diagnosis not present

## 2016-12-07 DIAGNOSIS — G8929 Other chronic pain: Secondary | ICD-10-CM | POA: Diagnosis not present

## 2016-12-07 DIAGNOSIS — I10 Essential (primary) hypertension: Secondary | ICD-10-CM | POA: Diagnosis not present

## 2016-12-14 DIAGNOSIS — Z940 Kidney transplant status: Secondary | ICD-10-CM | POA: Diagnosis not present

## 2016-12-14 DIAGNOSIS — D631 Anemia in chronic kidney disease: Secondary | ICD-10-CM | POA: Diagnosis not present

## 2016-12-15 DIAGNOSIS — N2889 Other specified disorders of kidney and ureter: Secondary | ICD-10-CM | POA: Diagnosis not present

## 2016-12-15 DIAGNOSIS — I4892 Unspecified atrial flutter: Secondary | ICD-10-CM | POA: Diagnosis not present

## 2016-12-15 DIAGNOSIS — E785 Hyperlipidemia, unspecified: Secondary | ICD-10-CM | POA: Diagnosis not present

## 2016-12-15 DIAGNOSIS — E119 Type 2 diabetes mellitus without complications: Secondary | ICD-10-CM | POA: Diagnosis not present

## 2016-12-15 DIAGNOSIS — N189 Chronic kidney disease, unspecified: Secondary | ICD-10-CM | POA: Diagnosis not present

## 2016-12-15 DIAGNOSIS — N529 Male erectile dysfunction, unspecified: Secondary | ICD-10-CM | POA: Diagnosis not present

## 2016-12-15 DIAGNOSIS — I10 Essential (primary) hypertension: Secondary | ICD-10-CM | POA: Diagnosis not present

## 2016-12-15 DIAGNOSIS — G8929 Other chronic pain: Secondary | ICD-10-CM | POA: Diagnosis not present

## 2016-12-15 DIAGNOSIS — Z940 Kidney transplant status: Secondary | ICD-10-CM | POA: Diagnosis not present

## 2016-12-15 DIAGNOSIS — I151 Hypertension secondary to other renal disorders: Secondary | ICD-10-CM | POA: Diagnosis not present

## 2016-12-15 DIAGNOSIS — K56609 Unspecified intestinal obstruction, unspecified as to partial versus complete obstruction: Secondary | ICD-10-CM | POA: Diagnosis not present

## 2016-12-15 DIAGNOSIS — Z79899 Other long term (current) drug therapy: Secondary | ICD-10-CM | POA: Diagnosis not present

## 2016-12-15 DIAGNOSIS — M25569 Pain in unspecified knee: Secondary | ICD-10-CM | POA: Diagnosis not present

## 2016-12-15 DIAGNOSIS — M25562 Pain in left knee: Secondary | ICD-10-CM | POA: Diagnosis not present

## 2017-02-16 DIAGNOSIS — Z96652 Presence of left artificial knee joint: Secondary | ICD-10-CM | POA: Diagnosis not present

## 2017-02-16 DIAGNOSIS — Z471 Aftercare following joint replacement surgery: Secondary | ICD-10-CM | POA: Diagnosis not present

## 2017-03-11 DIAGNOSIS — D631 Anemia in chronic kidney disease: Secondary | ICD-10-CM | POA: Diagnosis not present

## 2017-03-11 DIAGNOSIS — Z940 Kidney transplant status: Secondary | ICD-10-CM | POA: Diagnosis not present

## 2017-03-14 DIAGNOSIS — Z23 Encounter for immunization: Secondary | ICD-10-CM | POA: Diagnosis not present

## 2017-03-14 DIAGNOSIS — I4892 Unspecified atrial flutter: Secondary | ICD-10-CM | POA: Diagnosis not present

## 2017-03-14 DIAGNOSIS — R609 Edema, unspecified: Secondary | ICD-10-CM | POA: Diagnosis not present

## 2017-03-14 DIAGNOSIS — N2889 Other specified disorders of kidney and ureter: Secondary | ICD-10-CM | POA: Diagnosis not present

## 2017-03-14 DIAGNOSIS — I10 Essential (primary) hypertension: Secondary | ICD-10-CM | POA: Diagnosis not present

## 2017-03-14 DIAGNOSIS — E119 Type 2 diabetes mellitus without complications: Secondary | ICD-10-CM | POA: Diagnosis not present

## 2017-03-14 DIAGNOSIS — M25569 Pain in unspecified knee: Secondary | ICD-10-CM | POA: Diagnosis not present

## 2017-03-14 DIAGNOSIS — G8929 Other chronic pain: Secondary | ICD-10-CM | POA: Diagnosis not present

## 2017-03-14 DIAGNOSIS — I151 Hypertension secondary to other renal disorders: Secondary | ICD-10-CM | POA: Diagnosis not present

## 2017-03-14 DIAGNOSIS — Z79899 Other long term (current) drug therapy: Secondary | ICD-10-CM | POA: Diagnosis not present

## 2017-03-14 DIAGNOSIS — Z940 Kidney transplant status: Secondary | ICD-10-CM | POA: Diagnosis not present

## 2017-03-14 DIAGNOSIS — E785 Hyperlipidemia, unspecified: Secondary | ICD-10-CM | POA: Diagnosis not present

## 2017-03-14 DIAGNOSIS — N529 Male erectile dysfunction, unspecified: Secondary | ICD-10-CM | POA: Diagnosis not present

## 2017-03-28 DIAGNOSIS — Z940 Kidney transplant status: Secondary | ICD-10-CM | POA: Diagnosis not present

## 2017-04-08 DIAGNOSIS — E118 Type 2 diabetes mellitus with unspecified complications: Secondary | ICD-10-CM | POA: Diagnosis not present

## 2017-04-08 DIAGNOSIS — I10 Essential (primary) hypertension: Secondary | ICD-10-CM | POA: Diagnosis not present

## 2017-04-26 DIAGNOSIS — Z940 Kidney transplant status: Secondary | ICD-10-CM | POA: Diagnosis not present

## 2017-06-16 ENCOUNTER — Emergency Department (HOSPITAL_COMMUNITY): Payer: Medicare Other

## 2017-06-16 ENCOUNTER — Encounter (HOSPITAL_COMMUNITY): Payer: Self-pay | Admitting: Emergency Medicine

## 2017-06-16 DIAGNOSIS — L819 Disorder of pigmentation, unspecified: Secondary | ICD-10-CM | POA: Diagnosis not present

## 2017-06-16 DIAGNOSIS — E878 Other disorders of electrolyte and fluid balance, not elsewhere classified: Secondary | ICD-10-CM | POA: Diagnosis not present

## 2017-06-16 DIAGNOSIS — Z8619 Personal history of other infectious and parasitic diseases: Secondary | ICD-10-CM | POA: Diagnosis not present

## 2017-06-16 DIAGNOSIS — L97429 Non-pressure chronic ulcer of left heel and midfoot with unspecified severity: Secondary | ICD-10-CM | POA: Diagnosis not present

## 2017-06-16 DIAGNOSIS — Z940 Kidney transplant status: Secondary | ICD-10-CM | POA: Diagnosis not present

## 2017-06-16 DIAGNOSIS — Z8679 Personal history of other diseases of the circulatory system: Secondary | ICD-10-CM | POA: Diagnosis not present

## 2017-06-16 DIAGNOSIS — D8989 Other specified disorders involving the immune mechanism, not elsewhere classified: Secondary | ICD-10-CM | POA: Diagnosis not present

## 2017-06-16 DIAGNOSIS — Z4822 Encounter for aftercare following kidney transplant: Secondary | ICD-10-CM | POA: Diagnosis not present

## 2017-06-16 DIAGNOSIS — Z794 Long term (current) use of insulin: Secondary | ICD-10-CM | POA: Diagnosis not present

## 2017-06-16 DIAGNOSIS — E119 Type 2 diabetes mellitus without complications: Secondary | ICD-10-CM | POA: Diagnosis not present

## 2017-06-16 DIAGNOSIS — S91302D Unspecified open wound, left foot, subsequent encounter: Secondary | ICD-10-CM | POA: Diagnosis not present

## 2017-06-16 DIAGNOSIS — I10 Essential (primary) hypertension: Secondary | ICD-10-CM | POA: Diagnosis not present

## 2017-06-16 DIAGNOSIS — E785 Hyperlipidemia, unspecified: Secondary | ICD-10-CM | POA: Diagnosis not present

## 2017-06-16 DIAGNOSIS — R600 Localized edema: Secondary | ICD-10-CM | POA: Diagnosis not present

## 2017-06-16 DIAGNOSIS — M7989 Other specified soft tissue disorders: Secondary | ICD-10-CM | POA: Diagnosis not present

## 2017-06-16 DIAGNOSIS — Z79899 Other long term (current) drug therapy: Secondary | ICD-10-CM | POA: Diagnosis not present

## 2017-06-16 DIAGNOSIS — Z5321 Procedure and treatment not carried out due to patient leaving prior to being seen by health care provider: Secondary | ICD-10-CM | POA: Diagnosis not present

## 2017-06-16 DIAGNOSIS — Z4800 Encounter for change or removal of nonsurgical wound dressing: Secondary | ICD-10-CM | POA: Diagnosis not present

## 2017-06-16 DIAGNOSIS — E11621 Type 2 diabetes mellitus with foot ulcer: Secondary | ICD-10-CM | POA: Diagnosis not present

## 2017-06-16 DIAGNOSIS — Z792 Long term (current) use of antibiotics: Secondary | ICD-10-CM | POA: Diagnosis not present

## 2017-06-16 LAB — CBG MONITORING, ED: Glucose-Capillary: 106 mg/dL — ABNORMAL HIGH (ref 65–99)

## 2017-06-16 NOTE — ED Triage Notes (Signed)
Pt states 1 month of skin irritation to left heel that has gotten worse this past month. He has a circular diabetic foot ulcer to left heel, closed wound. No drainage. Pt unable to put any pressure on the heel. No open areas to wound. Pt states his blood sugars have been normal consecutively. Pt bilateral legs feel tight to palpation, he states this is his norm to both legs for 2 years.

## 2017-06-16 NOTE — ED Notes (Signed)
Checked CBG 106, RN informed

## 2017-06-17 ENCOUNTER — Emergency Department (HOSPITAL_COMMUNITY)
Admission: EM | Admit: 2017-06-17 | Discharge: 2017-06-17 | Disposition: A | Discharge status: Home / Self Care | Payer: Medicare Other | Attending: Emergency Medicine | Admitting: Emergency Medicine

## 2017-06-17 NOTE — ED Notes (Signed)
Pt called multiple times without an answer

## 2017-06-17 NOTE — ED Notes (Signed)
Called pt x1 with no answer in lobby

## 2017-06-22 DIAGNOSIS — N529 Male erectile dysfunction, unspecified: Secondary | ICD-10-CM | POA: Diagnosis not present

## 2017-06-22 DIAGNOSIS — K56609 Unspecified intestinal obstruction, unspecified as to partial versus complete obstruction: Secondary | ICD-10-CM | POA: Diagnosis not present

## 2017-06-22 DIAGNOSIS — Z940 Kidney transplant status: Secondary | ICD-10-CM | POA: Diagnosis not present

## 2017-06-22 DIAGNOSIS — N2889 Other specified disorders of kidney and ureter: Secondary | ICD-10-CM | POA: Diagnosis not present

## 2017-06-22 DIAGNOSIS — I4892 Unspecified atrial flutter: Secondary | ICD-10-CM | POA: Diagnosis not present

## 2017-06-22 DIAGNOSIS — Z79899 Other long term (current) drug therapy: Secondary | ICD-10-CM | POA: Diagnosis not present

## 2017-06-22 DIAGNOSIS — I151 Hypertension secondary to other renal disorders: Secondary | ICD-10-CM | POA: Diagnosis not present

## 2017-06-22 DIAGNOSIS — E119 Type 2 diabetes mellitus without complications: Secondary | ICD-10-CM | POA: Diagnosis not present

## 2017-06-22 DIAGNOSIS — E785 Hyperlipidemia, unspecified: Secondary | ICD-10-CM | POA: Diagnosis not present

## 2017-06-22 DIAGNOSIS — E669 Obesity, unspecified: Secondary | ICD-10-CM | POA: Diagnosis not present

## 2017-06-23 ENCOUNTER — Ambulatory Visit: Payer: Self-pay | Admitting: Podiatry

## 2017-07-18 ENCOUNTER — Ambulatory Visit: Payer: Self-pay | Admitting: Podiatry

## 2017-07-21 ENCOUNTER — Encounter: Payer: Self-pay | Admitting: Podiatry

## 2017-07-21 ENCOUNTER — Ambulatory Visit (INDEPENDENT_AMBULATORY_CARE_PROVIDER_SITE_OTHER): Payer: Medicare Other

## 2017-07-21 ENCOUNTER — Other Ambulatory Visit: Payer: Self-pay | Admitting: Podiatry

## 2017-07-21 ENCOUNTER — Ambulatory Visit (INDEPENDENT_AMBULATORY_CARE_PROVIDER_SITE_OTHER): Payer: Medicare Other | Admitting: Podiatry

## 2017-07-21 DIAGNOSIS — M79672 Pain in left foot: Secondary | ICD-10-CM

## 2017-07-21 DIAGNOSIS — M779 Enthesopathy, unspecified: Secondary | ICD-10-CM

## 2017-07-21 DIAGNOSIS — I739 Peripheral vascular disease, unspecified: Secondary | ICD-10-CM

## 2017-07-21 DIAGNOSIS — L97401 Non-pressure chronic ulcer of unspecified heel and midfoot limited to breakdown of skin: Secondary | ICD-10-CM | POA: Diagnosis not present

## 2017-07-21 MED ORDER — CEPHALEXIN 500 MG PO CAPS: 500.0000 mg | ORAL_CAPSULE | Freq: Four times a day (QID) | ORAL | 1 refills | Status: DC

## 2017-07-21 NOTE — Progress Notes (Signed)
   Subjective:    Patient ID: [name], male    DOB: [birth year], 62 y.o.   MRN: [id number]  HPI    Review of Systems  All other systems reviewed and are negative.      Objective:   Physical Exam        Assessment & Plan:

## 2017-07-21 NOTE — Progress Notes (Signed)
Subjective:   Patient ID: [name], male   DOB: 62 y.o.   MRN: [id number]   HPI Patient presents stating he has had a lot of pain in the bottom of his left heel for the last couple weeks and that he is been soaking it and he has had a kidney transplant and overall his sugar is been in good control.  Patient does not smoke and is not able to be significantly active   Review of Systems  All other systems reviewed and are negative.       Objective:  Physical Exam  Constitutional: He appears well-developed and well-nourished.  Cardiovascular: Intact distal pulses.  Pulmonary/Chest: Effort normal.  Musculoskeletal: Normal range of motion.  Neurological: He is alert.  Skin: Skin is warm.  Nursing note and vitals reviewed. I did note that the pulses are intact but they are weak and ABIs did confirm this but he states he does get vascular studies done.  Patient has diminished neurological sharp dull vibratory overall and I noted on the plantar aspect left heel there is keratotic lesion measuring about 2 x 2 cm that is moderately painful when pressed and upon debridement showed superficial breakdown of tissue with no deep breakdown of tissue in the area of breakdown measuring approximately 7 x 7 mm with 1 mm of depth     Assessment:  Trauma to the left plantar heel with poor health individual     Plan:  H&P and condition x-rays reviewed with patient.  At this point I did do sharp sterile debridement of all tissue I flushed it found to be superficial and I applied thick padding around the area and a small amount of Iodosorb to dry the area out and gave instructions on wet-to-dry dressings at home and padding to keep pressure off this area.  I placed him on antibiotic cephalexin 500 mg 3 times daily and I will see him back again in 1 week or earlier if any issues should occur and I instructed if he should develop any systemic signs of infection to go straight to the hospital  X-rays were  negative for signs of bony encroachment or lysis condition

## 2017-07-25 ENCOUNTER — Ambulatory Visit (INDEPENDENT_AMBULATORY_CARE_PROVIDER_SITE_OTHER): Payer: Medicare Other | Admitting: Family Medicine

## 2017-07-25 ENCOUNTER — Encounter: Payer: Self-pay | Admitting: Family Medicine

## 2017-07-25 VITALS — BP 139/75 | HR 61 | Temp 97.8°F | Resp 18 | Ht 70.0 in | Wt 222.0 lb

## 2017-07-25 DIAGNOSIS — S91302A Unspecified open wound, left foot, initial encounter: Secondary | ICD-10-CM

## 2017-07-25 DIAGNOSIS — R600 Localized edema: Secondary | ICD-10-CM

## 2017-07-25 DIAGNOSIS — Z8679 Personal history of other diseases of the circulatory system: Secondary | ICD-10-CM | POA: Diagnosis not present

## 2017-07-25 DIAGNOSIS — I10 Essential (primary) hypertension: Secondary | ICD-10-CM

## 2017-07-25 DIAGNOSIS — E785 Hyperlipidemia, unspecified: Secondary | ICD-10-CM | POA: Diagnosis not present

## 2017-07-25 DIAGNOSIS — I872 Venous insufficiency (chronic) (peripheral): Secondary | ICD-10-CM

## 2017-07-25 DIAGNOSIS — S91302D Unspecified open wound, left foot, subsequent encounter: Secondary | ICD-10-CM

## 2017-07-25 DIAGNOSIS — F1190 Opioid use, unspecified, uncomplicated: Secondary | ICD-10-CM

## 2017-07-25 DIAGNOSIS — E1165 Type 2 diabetes mellitus with hyperglycemia: Secondary | ICD-10-CM | POA: Diagnosis not present

## 2017-07-25 DIAGNOSIS — F1120 Opioid dependence, uncomplicated: Secondary | ICD-10-CM | POA: Diagnosis not present

## 2017-07-25 DIAGNOSIS — Z940 Kidney transplant status: Secondary | ICD-10-CM

## 2017-07-25 LAB — POCT URINALYSIS DIP (DEVICE)
Bilirubin Urine: NEGATIVE
Glucose, UA: NEGATIVE mg/dL
Hgb urine dipstick: NEGATIVE
Ketones, ur: NEGATIVE mg/dL
Leukocytes, UA: NEGATIVE
Nitrite: NEGATIVE
Protein, ur: >=300 mg/dL — AB
Specific Gravity, Urine: 1.025 (ref 1.005–1.030)
Urobilinogen, UA: 0.2 mg/dL (ref 0.0–1.0)
pH: 5.5 (ref 5.0–8.0)

## 2017-07-25 LAB — GLUCOSE, POCT (MANUAL RESULT ENTRY): POC Glucose: 118 mg/dl — AB (ref 70–99)

## 2017-07-25 LAB — POCT GLYCOSYLATED HEMOGLOBIN (HGB A1C): Hemoglobin A1C: 7.1
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
## 2017-07-25 NOTE — Patient Instructions (Addendum)
Thanks for establishing care.   Follow up with podiatry as scheduled.   Your hemoglobin a1c is 7.1, which is at goal. Will continue medication as previously prescribe.   Your blood pressure is at goal on current medication regimen, no changes warranted. You have been lost to follow up with cardiology. I will send a referral to establish care. - Continue medication, monitor blood pressure at home. Continue DASH diet. Reminder to go to the ER if any CP, SOB, nausea, dizziness, severe HA, changes vision/speech, left arm numbness and tingling and jaw pain.  Continue following up with methadone clinic as scheduled

## 2017-07-26 LAB — CBC WITH DIFFERENTIAL/PLATELET
Basophils Absolute: 0 10*3/uL (ref 0.0–0.2)
Basos: 1 %
EOS (ABSOLUTE): 0.3 10*3/uL (ref 0.0–0.4)
Eos: 6 %
Hematocrit: 44.3 % (ref 37.5–51.0)
Hemoglobin: 14.5 g/dL (ref 13.0–17.7)
Immature Grans (Abs): 0 10*3/uL (ref 0.0–0.1)
Immature Granulocytes: 0 %
Lymphocytes Absolute: 1 10*3/uL (ref 0.7–3.1)
Lymphs: 20 %
MCH: 25.8 pg — ABNORMAL LOW (ref 26.6–33.0)
MCHC: 32.7 g/dL (ref 31.5–35.7)
MCV: 79 fL (ref 79–97)
Monocytes Absolute: 0.6 10*3/uL (ref 0.1–0.9)
Monocytes: 13 %
Neutrophils Absolute: 3 10*3/uL (ref 1.4–7.0)
Neutrophils: 60 %
Platelets: 283 10*3/uL (ref 150–379)
RBC: 5.61 x10E6/uL (ref 4.14–5.80)
RDW: 15.7 % — ABNORMAL HIGH (ref 12.3–15.4)
WBC: 4.9 10*3/uL (ref 3.4–10.8)

## 2017-07-26 LAB — BASIC METABOLIC PANEL
BUN/Creatinine Ratio: 14 (ref 10–24)
BUN: 30 mg/dL — ABNORMAL HIGH (ref 8–27)
CO2: 24 mmol/L (ref 20–29)
Calcium: 10 mg/dL (ref 8.6–10.2)
Chloride: 97 mmol/L (ref 96–106)
Creatinine, Ser: 2.15 mg/dL — ABNORMAL HIGH (ref 0.76–1.27)
GFR calc Af Amer: 37 mL/min/{1.73_m2} — ABNORMAL LOW (ref >59)
GFR calc non Af Amer: 32 mL/min/{1.73_m2} — ABNORMAL LOW (ref >59)
Glucose: 107 mg/dL — ABNORMAL HIGH (ref 65–99)
Potassium: 4.1 mmol/L (ref 3.5–5.2)
Sodium: 141 mmol/L (ref 134–144)

## 2017-07-26 LAB — MICROALBUMIN / CREATININE URINE RATIO
Creatinine, Urine: 176.6 mg/dL
Microalb/Creat Ratio: 749 mg/g creat — ABNORMAL HIGH (ref 0.0–30.0)
Microalbumin, Urine: 1322.8 ug/mL

## 2017-07-27 ENCOUNTER — Telehealth: Payer: Self-pay

## 2017-07-27 NOTE — Telephone Encounter (Signed)
Called and spoke with patient advised that creatinine is elevated and is consistent with chronic kidney disease. Advised that we will fax labs to [facility] and no changes to medications are going to be made at this time. Asked that patient keep next scheduled appointment with specialist. Thanks!

## 2017-07-27 NOTE — Telephone Encounter (Signed)
-----   Message from Dorena Dew, [place] sent at 07/26/2017  7:06 PM EDT ----- Regarding: lab results Please inform patient that her creatinine is elevated, which is consistent with chronic kidney disease.  Please fax labs to Dr. Moshe Cipro at [facility].  No medication changes warranted on today.  Patient is to continue to follow-up with specialist as scheduled. Thanks

## 2017-07-28 ENCOUNTER — Encounter: Payer: Self-pay | Admitting: Podiatry

## 2017-07-28 ENCOUNTER — Ambulatory Visit (INDEPENDENT_AMBULATORY_CARE_PROVIDER_SITE_OTHER): Payer: Medicare Other | Admitting: Podiatry

## 2017-07-28 VITALS — BP 155/92 | HR 87 | Resp 16

## 2017-07-28 DIAGNOSIS — I739 Peripheral vascular disease, unspecified: Secondary | ICD-10-CM | POA: Diagnosis not present

## 2017-07-28 DIAGNOSIS — L97401 Non-pressure chronic ulcer of unspecified heel and midfoot limited to breakdown of skin: Secondary | ICD-10-CM | POA: Diagnosis not present

## 2017-07-28 NOTE — Progress Notes (Signed)
Subjective:   Patient ID: [name], male   DOB: 62 y.o.   MRN: [id number]   HPI Patient states it is improving quite a bit but is still present on my left heel   ROS      Objective:  Physical Exam  Neurovascular status intact with slight breakdown of tissue plantar aspect left localized that has improved with no drainage no odor no erythema     Assessment:  Ulcer plantar aspect left heel that continues to improve with offloading and soaks medication     Plan:  Debrided tissue did not note any subcutaneous exposure and applied a small amount of Iodosorb with padding and sterile dressing and instructed on continuation of padding and utilization of soaks and patient was given strict instructions of any redness and drainage and odor were to occur to be seen back immediately or systemic signs of infection to go straight to the emergency room and if not this should heal uneventfully

## 2017-07-30 DIAGNOSIS — F1190 Opioid use, unspecified, uncomplicated: Secondary | ICD-10-CM | POA: Insufficient documentation

## 2017-07-30 DIAGNOSIS — F1120 Opioid dependence, uncomplicated: Secondary | ICD-10-CM | POA: Insufficient documentation

## 2017-08-04 ENCOUNTER — Telehealth: Payer: Self-pay

## 2017-08-04 NOTE — Telephone Encounter (Signed)
Called patient, he wishes to keep [facility] cardiologist and does not want the referral we put in for him.

## 2017-08-23 ENCOUNTER — Other Ambulatory Visit: Payer: Self-pay

## 2017-08-23 DIAGNOSIS — L97909 Non-pressure chronic ulcer of unspecified part of unspecified lower leg with unspecified severity: Secondary | ICD-10-CM

## 2017-08-23 DIAGNOSIS — I872 Venous insufficiency (chronic) (peripheral): Secondary | ICD-10-CM

## 2017-09-13 DIAGNOSIS — E119 Type 2 diabetes mellitus without complications: Secondary | ICD-10-CM | POA: Diagnosis not present

## 2017-09-13 DIAGNOSIS — Z940 Kidney transplant status: Secondary | ICD-10-CM | POA: Diagnosis not present

## 2017-09-21 ENCOUNTER — Ambulatory Visit (INDEPENDENT_AMBULATORY_CARE_PROVIDER_SITE_OTHER)
Admission: RE | Admit: 2017-09-21 | Discharge: 2017-09-21 | Disposition: A | Discharge status: Home / Self Care | Payer: Medicare Other | Source: Ambulatory Visit | Attending: Vascular Surgery | Admitting: Vascular Surgery

## 2017-09-21 ENCOUNTER — Other Ambulatory Visit: Payer: Self-pay

## 2017-09-21 ENCOUNTER — Ambulatory Visit (INDEPENDENT_AMBULATORY_CARE_PROVIDER_SITE_OTHER): Payer: Medicare Other | Admitting: Vascular Surgery

## 2017-09-21 ENCOUNTER — Encounter: Payer: Self-pay | Admitting: Vascular Surgery

## 2017-09-21 DIAGNOSIS — L97909 Non-pressure chronic ulcer of unspecified part of unspecified lower leg with unspecified severity: Secondary | ICD-10-CM

## 2017-09-21 DIAGNOSIS — I872 Venous insufficiency (chronic) (peripheral): Secondary | ICD-10-CM

## 2017-09-21 DIAGNOSIS — I132 Hypertensive heart and chronic kidney disease with heart failure and with stage 5 chronic kidney disease, or end stage renal disease: Secondary | ICD-10-CM | POA: Diagnosis not present

## 2017-09-21 DIAGNOSIS — I5033 Acute on chronic diastolic (congestive) heart failure: Secondary | ICD-10-CM | POA: Diagnosis not present

## 2017-09-21 DIAGNOSIS — N186 End stage renal disease: Secondary | ICD-10-CM | POA: Diagnosis not present

## 2017-09-21 DIAGNOSIS — Z940 Kidney transplant status: Secondary | ICD-10-CM | POA: Diagnosis not present

## 2017-09-21 DIAGNOSIS — R918 Other nonspecific abnormal finding of lung field: Secondary | ICD-10-CM | POA: Diagnosis not present

## 2017-09-21 DIAGNOSIS — J9601 Acute respiratory failure with hypoxia: Secondary | ICD-10-CM | POA: Diagnosis not present

## 2017-09-21 DIAGNOSIS — J189 Pneumonia, unspecified organism: Secondary | ICD-10-CM | POA: Diagnosis not present

## 2017-09-21 DIAGNOSIS — R0902 Hypoxemia: Secondary | ICD-10-CM | POA: Diagnosis not present
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
## 2017-09-21 NOTE — Progress Notes (Signed)
Vitals:   09/21/17 1322  BP: (!) 147/90  Pulse: 81  Resp: 18  Temp: (!) 97.4 F (36.3 C)  TempSrc: Oral  SpO2: 98%  Weight: 226 lb (102.5 kg)  Height: 5\' 11"  (1.803 m)

## 2017-09-22 ENCOUNTER — Other Ambulatory Visit: Payer: Self-pay

## 2017-09-22 DIAGNOSIS — L97909 Non-pressure chronic ulcer of unspecified part of unspecified lower leg with unspecified severity: Secondary | ICD-10-CM

## 2017-09-22 DIAGNOSIS — I872 Venous insufficiency (chronic) (peripheral): Secondary | ICD-10-CM

## 2017-09-23 ENCOUNTER — Encounter (HOSPITAL_COMMUNITY): Payer: Self-pay | Admitting: Emergency Medicine

## 2017-09-23 ENCOUNTER — Emergency Department (HOSPITAL_COMMUNITY): Payer: Medicare Other

## 2017-09-23 ENCOUNTER — Inpatient Hospital Stay (HOSPITAL_COMMUNITY)
Admission: EM | Admit: 2017-09-23 | Discharge: 2017-09-28 | DRG: 291 | Disposition: A | Discharge status: Home / Self Care | Payer: Medicare Other | Attending: Internal Medicine | Admitting: Internal Medicine

## 2017-09-23 ENCOUNTER — Other Ambulatory Visit: Payer: Self-pay

## 2017-09-23 DIAGNOSIS — I509 Heart failure, unspecified: Secondary | ICD-10-CM | POA: Diagnosis not present

## 2017-09-23 DIAGNOSIS — J9601 Acute respiratory failure with hypoxia: Secondary | ICD-10-CM | POA: Diagnosis not present

## 2017-09-23 DIAGNOSIS — E1122 Type 2 diabetes mellitus with diabetic chronic kidney disease: Secondary | ICD-10-CM | POA: Diagnosis present

## 2017-09-23 DIAGNOSIS — E669 Obesity, unspecified: Secondary | ICD-10-CM | POA: Diagnosis present

## 2017-09-23 DIAGNOSIS — F1120 Opioid dependence, uncomplicated: Secondary | ICD-10-CM | POA: Diagnosis present

## 2017-09-23 DIAGNOSIS — I4891 Unspecified atrial fibrillation: Secondary | ICD-10-CM | POA: Diagnosis not present

## 2017-09-23 DIAGNOSIS — G8929 Other chronic pain: Secondary | ICD-10-CM | POA: Diagnosis present

## 2017-09-23 DIAGNOSIS — Z7982 Long term (current) use of aspirin: Secondary | ICD-10-CM | POA: Diagnosis not present

## 2017-09-23 DIAGNOSIS — R042 Hemoptysis: Secondary | ICD-10-CM | POA: Diagnosis present

## 2017-09-23 DIAGNOSIS — Z96652 Presence of left artificial knee joint: Secondary | ICD-10-CM

## 2017-09-23 DIAGNOSIS — I340 Nonrheumatic mitral (valve) insufficiency: Secondary | ICD-10-CM | POA: Diagnosis not present

## 2017-09-23 DIAGNOSIS — N186 End stage renal disease: Secondary | ICD-10-CM | POA: Diagnosis present

## 2017-09-23 DIAGNOSIS — Z940 Kidney transplant status: Secondary | ICD-10-CM

## 2017-09-23 DIAGNOSIS — E11621 Type 2 diabetes mellitus with foot ulcer: Secondary | ICD-10-CM | POA: Diagnosis present

## 2017-09-23 DIAGNOSIS — N183 Chronic kidney disease, stage 3 unspecified: Secondary | ICD-10-CM

## 2017-09-23 DIAGNOSIS — Z794 Long term (current) use of insulin: Secondary | ICD-10-CM

## 2017-09-23 DIAGNOSIS — J189 Pneumonia, unspecified organism: Secondary | ICD-10-CM | POA: Diagnosis present

## 2017-09-23 DIAGNOSIS — R918 Other nonspecific abnormal finding of lung field: Secondary | ICD-10-CM | POA: Diagnosis not present

## 2017-09-23 DIAGNOSIS — I10 Essential (primary) hypertension: Secondary | ICD-10-CM | POA: Diagnosis present

## 2017-09-23 DIAGNOSIS — I5031 Acute diastolic (congestive) heart failure: Secondary | ICD-10-CM | POA: Diagnosis not present

## 2017-09-23 DIAGNOSIS — K920 Hematemesis: Secondary | ICD-10-CM | POA: Diagnosis present

## 2017-09-23 DIAGNOSIS — R0902 Hypoxemia: Secondary | ICD-10-CM

## 2017-09-23 DIAGNOSIS — L97429 Non-pressure chronic ulcer of left heel and midfoot with unspecified severity: Secondary | ICD-10-CM | POA: Diagnosis present

## 2017-09-23 DIAGNOSIS — K219 Gastro-esophageal reflux disease without esophagitis: Secondary | ICD-10-CM | POA: Diagnosis present

## 2017-09-23 DIAGNOSIS — I4892 Unspecified atrial flutter: Secondary | ICD-10-CM | POA: Diagnosis present

## 2017-09-23 DIAGNOSIS — IMO0002 Reserved for concepts with insufficient information to code with codable children: Secondary | ICD-10-CM | POA: Diagnosis present

## 2017-09-23 DIAGNOSIS — Z6832 Body mass index (BMI) 32.0-32.9, adult: Secondary | ICD-10-CM | POA: Diagnosis not present

## 2017-09-23 DIAGNOSIS — I5032 Chronic diastolic (congestive) heart failure: Secondary | ICD-10-CM | POA: Diagnosis not present

## 2017-09-23 DIAGNOSIS — Z79899 Other long term (current) drug therapy: Secondary | ICD-10-CM | POA: Diagnosis not present

## 2017-09-23 DIAGNOSIS — I482 Chronic atrial fibrillation, unspecified: Secondary | ICD-10-CM | POA: Diagnosis present

## 2017-09-23 DIAGNOSIS — R404 Transient alteration of awareness: Secondary | ICD-10-CM | POA: Diagnosis not present

## 2017-09-23 DIAGNOSIS — I272 Pulmonary hypertension, unspecified: Secondary | ICD-10-CM | POA: Diagnosis present

## 2017-09-23 DIAGNOSIS — F1190 Opioid use, unspecified, uncomplicated: Secondary | ICD-10-CM | POA: Diagnosis present

## 2017-09-23 DIAGNOSIS — I5033 Acute on chronic diastolic (congestive) heart failure: Secondary | ICD-10-CM | POA: Diagnosis present

## 2017-09-23 DIAGNOSIS — R531 Weakness: Secondary | ICD-10-CM | POA: Diagnosis not present

## 2017-09-23 DIAGNOSIS — R0602 Shortness of breath: Secondary | ICD-10-CM | POA: Diagnosis not present

## 2017-09-23 DIAGNOSIS — Z905 Acquired absence of kidney: Secondary | ICD-10-CM | POA: Diagnosis not present

## 2017-09-23 DIAGNOSIS — I351 Nonrheumatic aortic (valve) insufficiency: Secondary | ICD-10-CM | POA: Diagnosis not present

## 2017-09-23 DIAGNOSIS — I132 Hypertensive heart and chronic kidney disease with heart failure and with stage 5 chronic kidney disease, or end stage renal disease: Secondary | ICD-10-CM | POA: Diagnosis present

## 2017-09-23 DIAGNOSIS — Z992 Dependence on renal dialysis: Secondary | ICD-10-CM | POA: Diagnosis not present

## 2017-09-23 DIAGNOSIS — E1165 Type 2 diabetes mellitus with hyperglycemia: Secondary | ICD-10-CM | POA: Diagnosis not present

## 2017-09-23 LAB — I-STAT CG4 LACTIC ACID, ED
Lactic Acid, Venous: 2.11 mmol/L (ref 0.5–1.9)
Lactic Acid, Venous: 1.95 mmol/L — ABNORMAL HIGH (ref 0.5–1.9)

## 2017-09-23 LAB — URINALYSIS, ROUTINE W REFLEX MICROSCOPIC
Bilirubin Urine: NEGATIVE
Glucose, UA: 150 mg/dL — AB
Hgb urine dipstick: NEGATIVE
Ketones, ur: NEGATIVE mg/dL
Nitrite: NEGATIVE
Protein, ur: >=300 mg/dL — AB
Specific Gravity, Urine: 1.017 (ref 1.005–1.030)
pH: 5 (ref 5.0–8.0)

## 2017-09-23 LAB — COMPREHENSIVE METABOLIC PANEL
ALT: 16 U/L — ABNORMAL LOW (ref 17–63)
AST: 19 U/L (ref 15–41)
Albumin: 3.4 g/dL — ABNORMAL LOW (ref 3.5–5.0)
Alkaline Phosphatase: 71 U/L (ref 38–126)
Anion gap: 10 (ref 5–15)
BUN: 22 mg/dL — ABNORMAL HIGH (ref 6–20)
CO2: 23 mmol/L (ref 22–32)
Calcium: 8.9 mg/dL (ref 8.9–10.3)
Chloride: 104 mmol/L (ref 101–111)
Creatinine, Ser: 1.69 mg/dL — ABNORMAL HIGH (ref 0.61–1.24)
GFR calc Af Amer: 49 mL/min — ABNORMAL LOW (ref >60)
GFR calc non Af Amer: 42 mL/min — ABNORMAL LOW (ref >60)
Glucose, Bld: 127 mg/dL — ABNORMAL HIGH (ref 65–99)
Potassium: 4.2 mmol/L (ref 3.5–5.1)
Sodium: 137 mmol/L (ref 135–145)
Total Bilirubin: 0.8 mg/dL (ref 0.3–1.2)
Total Protein: 6.6 g/dL (ref 6.5–8.1)

## 2017-09-23 LAB — CBC
HCT: 43.8 % (ref 39.0–52.0)
Hemoglobin: 13.2 g/dL (ref 13.0–17.0)
MCH: 25.4 pg — ABNORMAL LOW (ref 26.0–34.0)
MCHC: 30.1 g/dL (ref 30.0–36.0)
MCV: 84.4 fL (ref 78.0–100.0)
Platelets: 292 10*3/uL (ref 150–400)
RBC: 5.19 MIL/uL (ref 4.22–5.81)
RDW: 15.3 % (ref 11.5–15.5)
WBC: 13.4 10*3/uL — ABNORMAL HIGH (ref 4.0–10.5)

## 2017-09-23 LAB — I-STAT TROPONIN, ED: Troponin i, poc: 0 ng/mL (ref 0.00–0.08)

## 2017-09-23 LAB — CBG MONITORING, ED: Glucose-Capillary: 107 mg/dL — ABNORMAL HIGH (ref 65–99)

## 2017-09-23 LAB — TYPE AND SCREEN
ABO/RH(D): A POS
Antibody Screen: NEGATIVE

## 2017-09-23 LAB — ABO/RH: ABO/RH(D): A POS

## 2017-09-23 LAB — LIPASE, BLOOD: Lipase: 20 U/L (ref 11–51)

## 2017-09-23 LAB — BRAIN NATRIURETIC PEPTIDE: B Natriuretic Peptide: 374.4 pg/mL — ABNORMAL HIGH (ref 0.0–100.0)

## 2017-09-23 MED ORDER — SODIUM CHLORIDE 0.9% FLUSH: 3.0000 mL | INTRAVENOUS | Status: DC | PRN

## 2017-09-23 MED ORDER — FUROSEMIDE 10 MG/ML IJ SOLN
20.0000 mg | Freq: Once | INTRAMUSCULAR | Status: AC
  Administered 2017-09-24: 20 mg via INTRAVENOUS
  Filled 2017-09-23: qty 2

## 2017-09-23 MED ORDER — VANCOMYCIN HCL 10 G IV SOLR
1250.0000 mg | INTRAVENOUS | Status: DC
  Administered 2017-09-24: 1250 mg via INTRAVENOUS
  Filled 2017-09-23: qty 1250

## 2017-09-23 MED ORDER — MAGNESIUM OXIDE 400 (241.3 MG) MG PO TABS
400.0000 mg | ORAL_TABLET | Freq: Every day | ORAL | Status: DC
  Administered 2017-09-24 – 2017-09-27 (×5): 400 mg via ORAL
  Filled 2017-09-23 (×5): qty 1

## 2017-09-23 MED ORDER — MYCOPHENOLATE SODIUM 180 MG PO TBEC
540.0000 mg | DELAYED_RELEASE_TABLET | Freq: Two times a day (BID) | ORAL | Status: DC
  Administered 2017-09-24 – 2017-09-28 (×10): 540 mg via ORAL
  Filled 2017-09-23 (×12): qty 3

## 2017-09-23 MED ORDER — AZITHROMYCIN 500 MG PO TABS
500.0000 mg | ORAL_TABLET | Freq: Every day | ORAL | Status: AC
  Administered 2017-09-24 – 2017-09-26 (×3): 500 mg via ORAL
  Filled 2017-09-23 (×3): qty 1

## 2017-09-23 MED ORDER — FUROSEMIDE 10 MG/ML IJ SOLN
20.0000 mg | Freq: Once | INTRAMUSCULAR | Status: AC
  Administered 2017-09-23: 20 mg via INTRAVENOUS
  Filled 2017-09-23: qty 2

## 2017-09-23 MED ORDER — ALBUTEROL SULFATE (2.5 MG/3ML) 0.083% IN NEBU
5.0000 mg | INHALATION_SOLUTION | Freq: Once | RESPIRATORY_TRACT | Status: AC
  Administered 2017-09-23: 5 mg via RESPIRATORY_TRACT
  Filled 2017-09-23: qty 6

## 2017-09-23 MED ORDER — CARVEDILOL 25 MG PO TABS
25.0000 mg | ORAL_TABLET | Freq: Two times a day (BID) | ORAL | Status: DC
  Administered 2017-09-24 – 2017-09-28 (×9): 25 mg via ORAL
  Filled 2017-09-23 (×9): qty 1

## 2017-09-23 MED ORDER — SODIUM CHLORIDE 0.9 % IV SOLN
2.0000 g | Freq: Once | INTRAVENOUS | Status: AC
  Administered 2017-09-23: 2 g via INTRAVENOUS
  Filled 2017-09-23: qty 2

## 2017-09-23 MED ORDER — PANTOPRAZOLE SODIUM 40 MG PO TBEC
40.0000 mg | DELAYED_RELEASE_TABLET | Freq: Every day | ORAL | Status: DC
  Administered 2017-09-24 – 2017-09-28 (×5): 40 mg via ORAL
  Filled 2017-09-23 (×5): qty 1

## 2017-09-23 MED ORDER — INSULIN ASPART 100 UNIT/ML ~~LOC~~ SOLN
0.0000 [IU] | Freq: Three times a day (TID) | SUBCUTANEOUS | Status: DC
  Administered 2017-09-24 (×2): 5 [IU] via SUBCUTANEOUS
  Administered 2017-09-24: 3 [IU] via SUBCUTANEOUS
  Administered 2017-09-25: 5 [IU] via SUBCUTANEOUS
  Administered 2017-09-25 (×2): 8 [IU] via SUBCUTANEOUS
  Administered 2017-09-26: 11 [IU] via SUBCUTANEOUS
  Administered 2017-09-26: 5 [IU] via SUBCUTANEOUS

## 2017-09-23 MED ORDER — NIFEDIPINE ER 60 MG PO TB24
60.0000 mg | ORAL_TABLET | Freq: Every day | ORAL | Status: DC
  Administered 2017-09-24 – 2017-09-28 (×5): 60 mg via ORAL
  Filled 2017-09-23 (×6): qty 1

## 2017-09-23 MED ORDER — SODIUM CHLORIDE 0.9 % IV SOLN: 250.0000 mL | INTRAVENOUS | Status: DC | PRN

## 2017-09-23 MED ORDER — SODIUM CHLORIDE 0.9 % IV SOLN
2.0000 g | INTRAVENOUS | Status: DC
  Administered 2017-09-24: 2 g via INTRAVENOUS
  Filled 2017-09-23 (×3): qty 20

## 2017-09-23 MED ORDER — PANTOPRAZOLE SODIUM 40 MG PO TBEC: 40.0000 mg | DELAYED_RELEASE_TABLET | Freq: Every day | ORAL | Status: DC

## 2017-09-23 MED ORDER — IPRATROPIUM-ALBUTEROL 0.5-2.5 (3) MG/3ML IN SOLN
3.0000 mL | Freq: Four times a day (QID) | RESPIRATORY_TRACT | Status: DC
  Administered 2017-09-24 – 2017-09-25 (×5): 3 mL via RESPIRATORY_TRACT
  Filled 2017-09-23 (×6): qty 3

## 2017-09-23 MED ORDER — TACROLIMUS 1 MG PO CAPS
2.0000 mg | ORAL_CAPSULE | Freq: Every day | ORAL | Status: DC
  Administered 2017-09-24 – 2017-09-28 (×5): 2 mg via ORAL
  Filled 2017-09-23 (×5): qty 2

## 2017-09-23 MED ORDER — TACROLIMUS 1 MG PO CAPS
1.0000 mg | ORAL_CAPSULE | Freq: Every day | ORAL | Status: DC
  Administered 2017-09-24 – 2017-09-27 (×5): 1 mg via ORAL
  Filled 2017-09-23 (×6): qty 1

## 2017-09-23 MED ORDER — PREGABALIN 100 MG PO CAPS
100.0000 mg | ORAL_CAPSULE | Freq: Three times a day (TID) | ORAL | Status: DC
  Administered 2017-09-24 – 2017-09-28 (×14): 100 mg via ORAL
  Filled 2017-09-23 (×14): qty 1

## 2017-09-23 MED ORDER — VANCOMYCIN HCL 10 G IV SOLR
2000.0000 mg | Freq: Once | INTRAVENOUS | Status: AC
  Administered 2017-09-23: 2000 mg via INTRAVENOUS
  Filled 2017-09-23: qty 2000

## 2017-09-23 MED ORDER — HEPARIN SODIUM (PORCINE) 5000 UNIT/ML IJ SOLN
5000.0000 [IU] | Freq: Three times a day (TID) | INTRAMUSCULAR | Status: DC
  Administered 2017-09-24: 5000 [IU] via SUBCUTANEOUS
  Filled 2017-09-23: qty 1

## 2017-09-23 MED ORDER — ASPIRIN 81 MG PO CHEW
81.0000 mg | CHEWABLE_TABLET | Freq: Every day | ORAL | Status: DC
  Administered 2017-09-24 – 2017-09-28 (×5): 81 mg via ORAL
  Filled 2017-09-23 (×5): qty 1

## 2017-09-23 MED ORDER — CLONIDINE HCL 0.3 MG PO TABS
0.3000 mg | ORAL_TABLET | Freq: Three times a day (TID) | ORAL | Status: DC
  Administered 2017-09-24 – 2017-09-27 (×13): 0.3 mg via ORAL
  Filled 2017-09-23 (×14): qty 1

## 2017-09-23 MED ORDER — FUROSEMIDE 10 MG/ML IJ SOLN
40.0000 mg | Freq: Two times a day (BID) | INTRAMUSCULAR | Status: DC
  Administered 2017-09-24 – 2017-09-25 (×3): 40 mg via INTRAVENOUS
  Filled 2017-09-23 (×3): qty 4

## 2017-09-23 MED ORDER — SODIUM CHLORIDE 0.9% FLUSH
3.0000 mL | Freq: Two times a day (BID) | INTRAVENOUS | Status: DC
  Administered 2017-09-24 – 2017-09-27 (×8): 3 mL via INTRAVENOUS

## 2017-09-23 MED ORDER — METHADONE HCL 10 MG PO TABS
115.0000 mg | ORAL_TABLET | Freq: Every day | ORAL | Status: DC
  Administered 2017-09-24 – 2017-09-28 (×5): 115 mg via ORAL
  Filled 2017-09-23 (×5): qty 12
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
## 2017-09-23 NOTE — ED Triage Notes (Signed)
Patient states he takes 35 units of novolog in the morning and 25 units at night.

## 2017-09-23 NOTE — ED Triage Notes (Signed)
CBG in triage 107.

## 2017-09-23 NOTE — ED Notes (Addendum)
Patient called EMS for complaint of vomiting blood. CBG 34, patient drank orange juice, last CBG check with EMS 195. Patient denies history of afib, EMS EKG shows patient in afib. Patient's chart shows history of afib since at least 2015. Patient denies chest pain.  EMS vitals 150/96 HR 76-110 RR 20 SpO2 95% 3L [place], does not wear O2 at baseline. Room air SpO2 89%

## 2017-09-23 NOTE — Progress Notes (Signed)
Pharmacy Antibiotic Note  [name] is a 62 y.o. male admitted on 09/23/2017 with pneumonia.  Pharmacy has been consulted for vancomycin dosing. Pt is afebrile but WBC is elevated and lactic acid is >2. Scr is elevated at 1.69.   Plan: Vancomycin 2gm IV x 1 then 1250mg  IV Q24H F/u renal fxn, C&S, clinical status and trough at [facility] F/u continuation of cefepime or other gram neg coverage     Temp (24hrs), Avg:97.7 F (36.5 C), Min:97.7 F (36.5 C), Max:97.7 F (36.5 C)  Recent Labs  Lab 09/23/17 1138 09/23/17 1841  WBC 13.4*  --   CREATININE 1.69*  --   LATICACIDVEN  --  2.11*    Estimated Creatinine Clearance: 56 mL/min (A) (by C-G formula based on SCr of 1.69 mg/dL (H)).    Allergies  Allergen Reactions  . Tramadol Hives, Itching and Rash    Antimicrobials this admission: Vanc 5/17>> Cefepime x 1 5/17  Dose adjustments this admission: N/A  Microbiology results: Pending  Thank you for allowing pharmacy to be a part of this patient's care.  Kenneshia Rehm, Rande Lawman 09/23/2017 8:04 PM

## 2017-09-23 NOTE — ED Notes (Signed)
Please note: EKG hardcopy is in triage; did NOT crossover.

## 2017-09-23 NOTE — ED Triage Notes (Signed)
Patient here with complaint of vomiting blood. Patient room air saturation 83% on room air. Placed on 3L [place], SpO2 98%. Patient denies history of GI problem.
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
## 2017-09-24 ENCOUNTER — Inpatient Hospital Stay (HOSPITAL_COMMUNITY): Payer: Medicare Other

## 2017-09-24 ENCOUNTER — Other Ambulatory Visit: Payer: Self-pay

## 2017-09-24 ENCOUNTER — Encounter (HOSPITAL_COMMUNITY): Payer: Self-pay | Admitting: General Practice

## 2017-09-24 DIAGNOSIS — I340 Nonrheumatic mitral (valve) insufficiency: Secondary | ICD-10-CM

## 2017-09-24 DIAGNOSIS — I351 Nonrheumatic aortic (valve) insufficiency: Secondary | ICD-10-CM

## 2017-09-24 DIAGNOSIS — I5031 Acute diastolic (congestive) heart failure: Secondary | ICD-10-CM

## 2017-09-24 LAB — GLUCOSE, CAPILLARY
Glucose-Capillary: 209 mg/dL — ABNORMAL HIGH (ref 65–99)
Glucose-Capillary: 180 mg/dL — ABNORMAL HIGH (ref 65–99)
Glucose-Capillary: 218 mg/dL — ABNORMAL HIGH (ref 65–99)
Glucose-Capillary: 264 mg/dL — ABNORMAL HIGH (ref 65–99)

## 2017-09-24 LAB — BASIC METABOLIC PANEL
Anion gap: 8 (ref 5–15)
BUN: 23 mg/dL — ABNORMAL HIGH (ref 6–20)
CO2: 25 mmol/L (ref 22–32)
Calcium: 8.7 mg/dL — ABNORMAL LOW (ref 8.9–10.3)
Chloride: 103 mmol/L (ref 101–111)
Creatinine, Ser: 1.56 mg/dL — ABNORMAL HIGH (ref 0.61–1.24)
GFR calc Af Amer: 54 mL/min — ABNORMAL LOW (ref >60)
GFR calc non Af Amer: 46 mL/min — ABNORMAL LOW (ref >60)
Glucose, Bld: 278 mg/dL — ABNORMAL HIGH (ref 65–99)
Potassium: 4.5 mmol/L (ref 3.5–5.1)
Sodium: 136 mmol/L (ref 135–145)

## 2017-09-24 LAB — MRSA PCR SCREENING: MRSA by PCR: NEGATIVE

## 2017-09-24 LAB — D-DIMER, QUANTITATIVE (NOT AT ARMC): D-Dimer, Quant: 1.72 ug/mL-FEU — ABNORMAL HIGH (ref 0.00–0.50)

## 2017-09-24 MED ORDER — HEPARIN BOLUS VIA INFUSION
5000.0000 [IU] | Freq: Once | INTRAVENOUS | Status: AC
  Administered 2017-09-24: 5000 [IU] via INTRAVENOUS
  Filled 2017-09-24: qty 5000

## 2017-09-24 MED ORDER — TECHNETIUM TC 99M DIETHYLENETRIAME-PENTAACETIC ACID
32.3000 | Freq: Once | INTRAVENOUS | Status: AC | PRN
  Administered 2017-09-24: 32.3 via RESPIRATORY_TRACT

## 2017-09-24 MED ORDER — TECHNETIUM TO 99M ALBUMIN AGGREGATED
4.2500 | Freq: Once | INTRAVENOUS | Status: AC | PRN
  Administered 2017-09-24: 4.25 via INTRAVENOUS

## 2017-09-24 MED ORDER — HEPARIN (PORCINE) IN NACL 100-0.45 UNIT/ML-% IJ SOLN
1750.0000 [IU]/h | INTRAMUSCULAR | Status: DC
  Administered 2017-09-24: 1750 [IU]/h via INTRAVENOUS
  Filled 2017-09-24: qty 250

## 2017-09-24 MED ORDER — ORAL CARE MOUTH RINSE
15.0000 mL | Freq: Two times a day (BID) | OROMUCOSAL | Status: DC
  Administered 2017-09-24 – 2017-09-27 (×6): 15 mL via OROMUCOSAL

## 2017-09-24 NOTE — Progress Notes (Signed)
Per NP heparin IV should be started due to elevated D Dimer.  V/Q scan will be done today since it was not done while patient was in ED.  Vayda Dungee, RN

## 2017-09-24 NOTE — Progress Notes (Signed)
Contacted phlebotomy for STAT D-dimer order.  Marlaysia Lenig, RN

## 2017-09-24 NOTE — Progress Notes (Signed)
Paged NP Shorr about elevated D Dimer 1.72.  Christine Morton, RN

## 2017-09-24 NOTE — Progress Notes (Addendum)
PROGRESS NOTE  [name]  KZS:[id number] DOB: 1955/07/31 DOA: 09/23/2017 PCP: Dorena Dew, FNP   Brief Narrative: [name] is a 62 y.o. male with a history of ESRD no longer on HD since renal transplant, HTN, T2DM, HTN, chronic AFib and chronic HFpEF who presented to the ED with a cough and hemoptysis associated with worsening shortness of breath. Symptoms worsened abruptly on the morning of admission in setting of viral URI symptoms. On arrival he was hypoxic with lower extremity edema which the patient reported chronically though has worsened recently with new orthopneic symptoms. BNP elevated to 374, troponin undetectable. WBC and lactic acid elevated and CXR demonstrated nonspecific bilateral airspace opacities. Blood cultures were obtained and antibiotics were started for pt on immunosuppressive therapy. D-dimer was found to be positive and heparin started empirically. V/Q scan has shown low probability of PE, so heparin was stopped.  Assessment & Plan: Active Problems:   DM (diabetes mellitus), type 2, uncontrolled ([name])   HTN (hypertension)   History of renal transplant   Atrial fibrillation (HCC)   Chronic diastolic CHF (congestive heart failure) (HCC)   Methadone use (HCC)   CHF exacerbation (HCC)   CKD (chronic kidney disease) stage 3, GFR 30-59 ml/min (HCC)   Acute CHF (congestive heart failure) ([name])  Acute hypoxic respiratory failure: Unclear etiology, though likely contributions by pulmonary edema and/or pneumonia. V/Q scan low probability and no wheezing on exam/hx COPD or tobacco use.  - Continue supplemental oxygen prn - Continue prn nebulizers, no indication for steroids. Consider outpatient PFTs. - Repeat CXR 2v in AM.  - Check MRSA PCR (was started on vancomycin initially)   Acute on chronic HFpEF: - Continue 2x potency lasix 40mg  IV BID  - Monitor daily weights, I/O - Repeat echo (60-65%, G2DD with no wall motion abnormalities on last check June  2018) - Continue telemetry while diuresing  Community acquired pneumonia: Bilateral bases possibly involved.  - Continue empiric abx given immunosuppressed status - Monitor blood cultures - Sputum culture ordered - Cannot rely on PCT in this patient  Stage III CKD, history of ESRD s/p renal transplant: - Monitor renal function with diuresis - Continue anti-rejection medications tacrolimus, mycophenolate  HTN: Mildly elevated - Continue home coreg, clonidine, nifedipine  T2DM: Last HbA1c 7.1%. - SSI  Chronic AFib: - Continue coreg for rate control - Continue ASA (CHADSVASc would indicate anticoagulation though this was stopped per nephrology as outpatient)  Chronic pain:  - Continue home medications: methadone, lyrica  Left heel ulcer:  - Local wound care.   DVT prophylaxis: SCDs Code Status: Full Family Communication: None at bedside Disposition Plan: Home when improved.  Consultants:   None  Procedures:   Echo pending  Antimicrobials:  Ceftriaxone, azithromycin 5/17 >>    Subjective: Feels slightly better from admission, but still very short of breath. Reports worsening orthopnea (which is new) and leg swelling (which is not new) PTA.   Objective: Vitals:   09/24/17 0500 09/24/17 0900 09/24/17 0903 09/24/17 1208  BP: (!) 150/91 (!) 155/83 (!) 155/83 (!) 146/86  Pulse: 98 79 79 80  Resp: 20 20 (!) 22 18  Temp: 98.2 F (36.8 C) 98.2 F (36.8 C) 98.2 F (36.8 C) 98.9 F (37.2 C)  TempSrc: Oral Oral Oral Oral  SpO2: 94% 92% 92% (!) 89%  Weight:      Height:        Intake/Output Summary (Last 24 hours) at 09/24/2017 1320 Last data filed at 09/24/2017  1100 Gross per 24 hour  Intake 1214.38 ml  Output 1475 ml  Net -260.62 ml   Filed Weights   09/24/17 0001  Weight: 105.2 kg (232 lb)    Gen: 63 y.o. male in no distress Pulm: Non-labored breathing supplemental oxygen. Bilateral bibasilar crackles and rhonchi.   CV: Irreg irreg. No murmur, rub, or  gallop. No JVD, 1+ LE edema. GI: Abdomen soft, non-tender, non-distended, with normoactive bowel sounds. No organomegaly or masses felt. Ext: Warm, no deformities Skin: Small shallow left heel ulcer Neuro: Alert and oriented. No focal neurological deficits. Psych: Judgement and insight appear normal. Mood & affect appropriate.   Data Reviewed: I have personally reviewed following labs and imaging studies  CBC: Recent Labs  Lab 09/23/17 1138  WBC 13.4*  HGB 13.2  HCT 43.8  MCV 84.4  PLT 893   Basic Metabolic Panel: Recent Labs  Lab 09/23/17 1138 09/24/17 0328  NA 137 136  K 4.2 4.5  CL 104 103  CO2 23 25  GLUCOSE 127* 278*  BUN 22* 23*  CREATININE 1.69* 1.56*  CALCIUM 8.9 8.7*   GFR: Estimated Creatinine Clearance: 61.4 mL/min (A) (by C-G formula based on SCr of 1.56 mg/dL (H)). Liver Function Tests: Recent Labs  Lab 09/23/17 1138  AST 19  ALT 16*  ALKPHOS 71  BILITOT 0.8  PROT 6.6  ALBUMIN 3.4*   Recent Labs  Lab 09/23/17 1138  LIPASE 20   No results for input(s): AMMONIA in the last 168 hours. Coagulation Profile: No results for input(s): INR, PROTIME in the last 168 hours. Cardiac Enzymes: No results for input(s): CKTOTAL, CKMB, CKMBINDEX, TROPONINI in the last 168 hours. BNP (last 3 results) No results for input(s): PROBNP in the last 8760 hours. HbA1C: No results for input(s): HGBA1C in the last 72 hours. CBG: Recent Labs  Lab 09/23/17 1136 09/24/17 0724 09/24/17 1204  GLUCAP 107* 209* 180*   Lipid Profile: No results for input(s): CHOL, HDL, LDLCALC, TRIG, CHOLHDL, LDLDIRECT in the last 72 hours. Thyroid Function Tests: No results for input(s): TSH, T4TOTAL, FREET4, T3FREE, THYROIDAB in the last 72 hours. Anemia Panel: No results for input(s): VITAMINB12, FOLATE, FERRITIN, TIBC, IRON, RETICCTPCT in the last 72 hours. Urine analysis:    Component Value Date/Time   COLORURINE YELLOW 09/23/2017 2109   APPEARANCEUR CLEAR 09/23/2017 2109     LABSPEC 1.017 09/23/2017 2109   PHURINE 5.0 09/23/2017 2109   GLUCOSEU 150 (A) 09/23/2017 2109   HGBUR NEGATIVE 09/23/2017 2109   [place] NEGATIVE 09/23/2017 2109   KETONESUR NEGATIVE 09/23/2017 2109   PROTEINUR >=300 (A) 09/23/2017 2109   UROBILINOGEN 0.2 07/25/2017 1004   NITRITE NEGATIVE 09/23/2017 2109   LEUKOCYTESUR TRACE (A) 09/23/2017 2109   Recent Results (from the past 240 hour(s))  Culture, blood (routine x 2)     Status: None (Preliminary result)   Collection Time: 09/23/17  6:12 PM  Result Value Ref Range Status   Specimen Description BLOOD RIGHT ANTECUBITAL  Final   Special Requests   Final    BOTTLES DRAWN AEROBIC AND ANAEROBIC Blood Culture adequate volume   Culture   Final    NO GROWTH < 24 HOURS Performed at [facility], [place] [address] [place], [place] [address]    Report Status PENDING  Incomplete  Culture, blood (routine x 2)     Status: None (Preliminary result)   Collection Time: 09/23/17  6:32 PM  Result Value Ref Range Status   Specimen Description BLOOD RIGHT  WRIST  Final   Special Requests   Final    BOTTLES DRAWN AEROBIC AND ANAEROBIC Blood Culture adequate volume   Culture   Final    NO GROWTH < 24 HOURS Performed at [facility], [address] [place], [place] [address]    Report Status PENDING  Incomplete      Radiology Studies: Dg Chest 2 View  Result Date: 09/23/2017 CLINICAL DATA:  62 year old male with hemoptysis. EXAM: CHEST - 2 VIEW COMPARISON:  11/03/2016 and prior chest radiographs FINDINGS: Cardiomegaly noted. Diffuse airspace disease within both mid lungs and LEFT lower lobe noted. There is no evidence of pleural effusion or pneumothorax. No acute bony abnormality noted. IMPRESSION: Bilateral airspace opacities which are nonspecific but may represent pneumonia, aspiration, edema or hemorrhage. Radiographic follow-up to resolution is recommended. Cardiomegaly. Electronically Signed   By: Margarette Canada M.D.   On:  09/23/2017 19:04   Nm Pulmonary Vent And Perf (v/q Scan)  Result Date: 09/24/2017 CLINICAL DATA:  Worsening productive cough and shortness of breath. Possible pulmonary embolism. Small amount of hemoptysis. EXAM: NUCLEAR MEDICINE VENTILATION - PERFUSION LUNG SCAN TECHNIQUE: Ventilation images were obtained in multiple projections using inhaled aerosol Tc-79m DTPA. Perfusion images were obtained in multiple projections after intravenous injection of Tc-77m-MAA. RADIOPHARMACEUTICALS:  32.3 mCi of Tc-59m DTPA aerosol inhalation and 4.25 mCi Tc43m-MAA IV COMPARISON:  Chest x-ray 09/23/2017 and chest CT 04/06/2014 FINDINGS: Ventilation: Subtle patchy nonsegmental decreased uptake over the central lungs compatible with recent chest x-ray findings of bilateral airspace disease. Perfusion: No wedge shaped peripheral perfusion defects to suggest acute pulmonary embolism. IMPRESSION: Normal VQ scan without evidence of pulmonary embolism. Electronically Signed   By: Marin Olp M.D.   On: 09/24/2017 09:02    Scheduled Meds: . aspirin  81 mg Oral Daily  . azithromycin  500 mg Oral Daily  . carvedilol  25 mg Oral BID WC  . cloNIDine  0.3 mg Oral TID  . furosemide  40 mg Intravenous BID  . insulin aspart  0-15 Units Subcutaneous TID WC  . ipratropium-albuterol  3 mL Nebulization Q6H  . magnesium oxide  400 mg Oral QHS  . mouth rinse  15 mL Mouth Rinse BID  . methadone  115 mg Oral Daily  . mycophenolate  540 mg Oral BID  . NIFEdipine  60 mg Oral Daily  . pantoprazole  40 mg Oral Daily  . pregabalin  100 mg Oral TID  . sodium chloride flush  3 mL Intravenous Q12H  . tacrolimus  1 mg Oral QHS  . tacrolimus  2 mg Oral Daily   Continuous Infusions: . sodium chloride    . cefTRIAXone (ROCEPHIN)  IV Stopped (09/24/17 0950)  . vancomycin       LOS: 1 day   Time spent: 35 minutes.  Patrecia Pour, MD [facility] Pager [phone]  If 7PM-7AM, please contact  night-coverage [email or web address] Password [email or web address] 09/24/2017, 1:20 PM

## 2017-09-24 NOTE — Progress Notes (Signed)
  Echocardiogram 2D Echocardiogram has been performed.  [name] 09/24/2017, 2:28 PM

## 2017-09-24 NOTE — Progress Notes (Addendum)
New Admission Note:   Arrival Method: from ED Mental Orientation: Alert and oriented x 4 Telemetry:3ebox 32 Assessment: Completed Skin: Diabetic ulcer on left heel  Pain:0/10 Safety Measures: Safety Fall Prevention Plan has been discussed  Admission:  [facility] [place] Orientation: Patient has been oriented to the room, unit and staff.  Family: wife at bedside   Orders to be reviewed and implemented. Will continue to monitor the patient. Call light has been placed within reach and bed alarm has been activated.   Venetia Night, RN Phone: [phone]
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
## 2017-09-25 ENCOUNTER — Inpatient Hospital Stay (HOSPITAL_COMMUNITY): Payer: Medicare Other

## 2017-09-25 LAB — BASIC METABOLIC PANEL
Anion gap: 12 (ref 5–15)
Anion gap: 10 (ref 5–15)
BUN: 26 mg/dL — ABNORMAL HIGH (ref 6–20)
BUN: 26 mg/dL — ABNORMAL HIGH (ref 6–20)
CO2: 22 mmol/L (ref 22–32)
CO2: 24 mmol/L (ref 22–32)
Calcium: 8.9 mg/dL (ref 8.9–10.3)
Calcium: 8.9 mg/dL (ref 8.9–10.3)
Chloride: 99 mmol/L — ABNORMAL LOW (ref 101–111)
Chloride: 100 mmol/L — ABNORMAL LOW (ref 101–111)
Creatinine, Ser: 1.9 mg/dL — ABNORMAL HIGH (ref 0.61–1.24)
Creatinine, Ser: 2.07 mg/dL — ABNORMAL HIGH (ref 0.61–1.24)
GFR calc Af Amer: 42 mL/min — ABNORMAL LOW (ref >60)
GFR calc Af Amer: 38 mL/min — ABNORMAL LOW (ref >60)
GFR calc non Af Amer: 36 mL/min — ABNORMAL LOW (ref >60)
GFR calc non Af Amer: 33 mL/min — ABNORMAL LOW (ref >60)
Glucose, Bld: 268 mg/dL — ABNORMAL HIGH (ref 65–99)
Glucose, Bld: 270 mg/dL — ABNORMAL HIGH (ref 65–99)
Potassium: 5.3 mmol/L — ABNORMAL HIGH (ref 3.5–5.1)
Potassium: 4.4 mmol/L (ref 3.5–5.1)
Sodium: 133 mmol/L — ABNORMAL LOW (ref 135–145)
Sodium: 134 mmol/L — ABNORMAL LOW (ref 135–145)

## 2017-09-25 LAB — GLUCOSE, CAPILLARY
Glucose-Capillary: 267 mg/dL — ABNORMAL HIGH (ref 65–99)
Glucose-Capillary: 271 mg/dL — ABNORMAL HIGH (ref 65–99)
Glucose-Capillary: 213 mg/dL — ABNORMAL HIGH (ref 65–99)
Glucose-Capillary: 147 mg/dL — ABNORMAL HIGH (ref 65–99)

## 2017-09-25 LAB — CBC
HCT: 39.2 % (ref 39.0–52.0)
Hemoglobin: 11.9 g/dL — ABNORMAL LOW (ref 13.0–17.0)
MCH: 25.2 pg — ABNORMAL LOW (ref 26.0–34.0)
MCHC: 30.4 g/dL (ref 30.0–36.0)
MCV: 83.1 fL (ref 78.0–100.0)
Platelets: 249 10*3/uL (ref 150–400)
RBC: 4.72 MIL/uL (ref 4.22–5.81)
RDW: 15 % (ref 11.5–15.5)
WBC: 8.9 10*3/uL (ref 4.0–10.5)

## 2017-09-25 LAB — ECHOCARDIOGRAM COMPLETE
Height: 71 in
Weight: 3712 oz

## 2017-09-25 LAB — EXPECTORATED SPUTUM ASSESSMENT W REFEX TO RESP CULTURE

## 2017-09-25 LAB — EXPECTORATED SPUTUM ASSESSMENT W GRAM STAIN, RFLX TO RESP C

## 2017-09-25 MED ORDER — SODIUM CHLORIDE 0.9 % IV SOLN
2.0000 g | INTRAVENOUS | Status: DC
  Administered 2017-09-25 – 2017-09-27 (×3): 2 g via INTRAVENOUS
  Filled 2017-09-25 (×3): qty 20

## 2017-09-25 NOTE — Progress Notes (Signed)
While on rounds, noted the antibiotic to be clamped so  pt did not receive rocephin at 0830 but the carrrier fluid instead, paged md on call to clarify to give dose now since it is a daily dose

## 2017-09-25 NOTE — Progress Notes (Signed)
SATURATION QUALIFICATIONS: (This note is used to comply with regulatory documentation for home oxygen)  Patient Saturations on Room Air at Rest = 95%  Patient Saturations on Room Air while Ambulating = 90%  Patient Saturations on0 Liters of oxygen while Ambulating  %  Please briefly explain why patient needs home oxygen:ra 94% at recovery, pt noted doe, instructed on pursed lip breathing in nose and out mouth, rest x2, walked 1/4 [place] and back using cane

## 2017-09-25 NOTE — Progress Notes (Signed)
PROGRESS NOTE  [name]  IDP:[id number] DOB: [birth year] DOA: 09/23/2017 PCP: Randy Dew, FNP   Brief Narrative: [name] is a 62 y.o. male with a history of ESRD no longer on HD since renal transplant, HTN, T2DM, HTN, chronic AFib and chronic HFpEF who presented to the ED with a cough and hemoptysis associated with worsening shortness of breath. Symptoms worsened abruptly on the morning of admission in setting of viral URI symptoms. On arrival he was hypoxic with lower extremity edema which the patient reported chronically though has worsened recently with new orthopneic symptoms. BNP elevated to 374, troponin undetectable. WBC and lactic acid elevated and CXR demonstrated nonspecific bilateral airspace opacities. Blood cultures were obtained and antibiotics were started for pt on immunosuppressive therapy. D-dimer was found to be positive and heparin started empirically. V/Q scan has shown low probability of PE, so heparin was stopped.  Assessment & Plan: Active Problems:   DM (diabetes mellitus), type 2, uncontrolled ([place])   HTN (hypertension)   History of renal transplant   Atrial fibrillation (HCC)   Chronic diastolic CHF (congestive heart failure) (HCC)   Methadone use (HCC)   CHF exacerbation (HCC)   CKD (chronic kidney disease) stage 3, GFR 30-59 ml/min (HCC)   Acute CHF (congestive heart failure) ([place])  Acute hypoxic respiratory failure: Unclear etiology, though likely contributions by pulmonary edema and/or pneumonia. V/Q scan low probability and no wheezing on exam/hx COPD or tobacco use.  - Continue supplemental oxygen prn, wean as tolerated. OOB w/assistance w/cane.  - Continue prn nebulizers, no indication for steroids. Consider outpatient PFTs. - Repeat CXR 2v 5/19 shows improvement.   Acute on chronic HFpEF: - Continue 2x potency lasix 40mg  IV BID  - Monitor daily weights, I/O; 2.8L out and some weight down.  - Repeat echo read pending (60-65%, G2DD with no  wall motion abnormalities on last check June 2018). If no significant change and creatinine confirmed to be elevated, will have to back off on diuresis. - Continue telemetry while diuresing  Community acquired pneumonia: Bilateral bases possibly involved.  - Continue empiric abx (ceftriaxone, azithromycin) given immunosuppressed status. DC vanc as this isn't consistent with MRSA pneumonia. - Monitor blood cultures - Sputum culture sent today - Cannot rely on PCT in this patient  Hemoptysis: Most likely due to pneumonia. V/Q scan low risk.  - Monitor CBC, if continues despite improvement in pulmonary infection, would need CT and/or pulm referral.   Stage III CKD, history of ESRD s/p renal transplant: - Monitor renal function with diuresis. Cr up w/diuresis and K up though mentions hemolysis. Will recheck BMP now and treat as indicated. - Continue anti-rejection medications tacrolimus, mycophenolate  HTN: Mildly elevated - Continue home coreg, clonidine, nifedipine  T2DM: Last HbA1c 7.1%. - SSI  Chronic AFib: - Continue coreg for rate control - Continue ASA (CHADSVASc would indicate anticoagulation though this was stopped per nephrology as outpatient)  Chronic pain:  - Continue home medications: methadone, lyrica  Left heel ulcer:  - Local wound care.   DVT prophylaxis: SCDs Code Status: Full Family Communication: Wife at bedside Disposition Plan: Home when improved.  Consultants:   None  Procedures:   Echo pending  Antimicrobials:  Ceftriaxone, azithromycin 5/17 >>    Vancomycin 5/17 - 5/19  Subjective: Pt and wife report interval improvement in breathing, exertional dyspnea, and wheezing. Continues to have some wheezing. Confirms history of preceding nasal congestion/URI sxs. Stable orthopnea.  Objective: Vitals:   09/25/17 0325 09/25/17  0522 09/25/17 0803 09/25/17 0820  BP:  126/76  126/82  Pulse:  83  90  Resp:  20    Temp:  98.4 F (36.9 C)    TempSrc:   Oral    SpO2: 93% 96% 95% 98%  Weight:  104 kg (229 lb 4.8 oz)    Height:        Intake/Output Summary (Last 24 hours) at 09/25/2017 1029 Last data filed at 09/25/2017 8366 Gross per 24 hour  Intake 1715 ml  Output 2550 ml  Net -835 ml   Filed Weights   09/24/17 0001 09/25/17 0522  Weight: 105.2 kg (232 lb) 104 kg (229 lb 4.8 oz)    Gen: 62 y.o. male in no distress Pulm: Non-labored breathing supplemental oxygen. Expiratory rhonchi mostly at bases with improvement but still scant crackles at R>L base. CV: Irreg irreg, rate in 70's. No murmur, rub, or gallop. No JVD, stable woody 1+ LE edema. GI: Abdomen soft, non-tender, non-distended, with normoactive bowel sounds. No organomegaly or masses felt. Ext: Warm, no deformities Skin: Small shallow left heel ulcer. Neuro: Alert and oriented. No focal neurological deficits. Psych: Judgement and insight appear normal. Mood & affect appropriate.   Data Reviewed: I have personally reviewed following labs and imaging studies  CBC: Recent Labs  Lab 09/23/17 1138 09/25/17 0730  WBC 13.4* 8.9  HGB 13.2 11.9*  HCT 43.8 39.2  MCV 84.4 83.1  PLT 292 294   Basic Metabolic Panel: Recent Labs  Lab 09/23/17 1138 09/24/17 0328 09/25/17 0730  NA 137 136 133*  K 4.2 4.5 5.3*  CL 104 103 99*  CO2 23 25 22   GLUCOSE 127* 278* 268*  BUN 22* 23* 26*  CREATININE 1.69* 1.56* 1.90*  CALCIUM 8.9 8.7* 8.9   GFR: Estimated Creatinine Clearance: 50.1 mL/min (A) (by C-G formula based on SCr of 1.9 mg/dL (H)). Liver Function Tests: Recent Labs  Lab 09/23/17 1138  AST 19  ALT 16*  ALKPHOS 71  BILITOT 0.8  PROT 6.6  ALBUMIN 3.4*   Recent Labs  Lab 09/23/17 1138  LIPASE 20   No results for input(s): AMMONIA in the last 168 hours. Coagulation Profile: No results for input(s): INR, PROTIME in the last 168 hours. Cardiac Enzymes: No results for input(s): CKTOTAL, CKMB, CKMBINDEX, TROPONINI in the last 168 hours. BNP (last 3  results) No results for input(s): PROBNP in the last 8760 hours. HbA1C: No results for input(s): HGBA1C in the last 72 hours. CBG: Recent Labs  Lab 09/24/17 0724 09/24/17 1204 09/24/17 1621 09/24/17 2121 09/25/17 0738  GLUCAP 209* 180* 218* 264* 267*   Lipid Profile: No results for input(s): CHOL, HDL, LDLCALC, TRIG, CHOLHDL, LDLDIRECT in the last 72 hours. Thyroid Function Tests: No results for input(s): TSH, T4TOTAL, FREET4, T3FREE, THYROIDAB in the last 72 hours. Anemia Panel: No results for input(s): VITAMINB12, FOLATE, FERRITIN, TIBC, IRON, RETICCTPCT in the last 72 hours. Urine analysis:    Component Value Date/Time   COLORURINE YELLOW 09/23/2017 2109   APPEARANCEUR CLEAR 09/23/2017 2109   LABSPEC 1.017 09/23/2017 2109   PHURINE 5.0 09/23/2017 2109   GLUCOSEU 150 (A) 09/23/2017 2109   HGBUR NEGATIVE 09/23/2017 2109   BILIRUBINUR NEGATIVE 09/23/2017 2109   KETONESUR NEGATIVE 09/23/2017 2109   PROTEINUR >=300 (A) 09/23/2017 2109   UROBILINOGEN 0.2 07/25/2017 1004   NITRITE NEGATIVE 09/23/2017 2109   LEUKOCYTESUR TRACE (A) 09/23/2017 2109   Recent Results (from the past 240 hour(s))  Culture, blood (routine x 2)  Status: None (Preliminary result)   Collection Time: 09/23/17  6:12 PM  Result Value Ref Range Status   Specimen Description BLOOD RIGHT ANTECUBITAL  Final   Special Requests   Final    BOTTLES DRAWN AEROBIC AND ANAEROBIC Blood Culture adequate volume   Culture   Final    NO GROWTH < 24 HOURS Performed at [facility], [address], [place], [place] [address]    Report Status PENDING  Incomplete  Culture, blood (routine x 2)     Status: None (Preliminary result)   Collection Time: 09/23/17  6:32 PM  Result Value Ref Range Status   Specimen Description BLOOD RIGHT WRIST  Final   Special Requests   Final    BOTTLES DRAWN AEROBIC AND ANAEROBIC Blood Culture adequate volume   Culture   Final    NO GROWTH < 24 HOURS Performed at [facility], [place] [address] [place], [place] [address]    Report Status PENDING  Incomplete  MRSA PCR Screening     Status: None   Collection Time: 09/24/17  1:43 PM  Result Value Ref Range Status   MRSA by PCR NEGATIVE NEGATIVE Final    Comment:        The GeneXpert MRSA Assay (FDA approved for NASAL specimens only), is one component of a comprehensive MRSA colonization surveillance program. It is not intended to diagnose MRSA infection nor to guide or monitor treatment for MRSA infections. Performed at [facility], [place] [address] [place], [place] [address]       Radiology Studies: Dg Chest 2 View  Result Date: 09/25/2017 CLINICAL DATA:  Patient with acute respiratory failure.  Hypoxia. EXAM: CHEST - 2 VIEW COMPARISON:  Chest radiograph 09/23/2017. FINDINGS: Monitoring leads overlie the patient. Stable cardiomegaly. Slight interval improvement in previously described multifocal bilateral consolidative opacities, right greater than left. Small bilateral pleural effusions. No pneumothorax. Thoracic spine degenerative changes. IMPRESSION: Slight interval improvement in multifocal bilateral right-greater-than-left airspace opacities, potentially improving edema or infection. Recommend continued follow-up to ensure resolution. Electronically Signed   By: Lovey Newcomer M.D.   On: 09/25/2017 10:14   Dg Chest 2 View  Result Date: 09/23/2017 CLINICAL DATA:  62 year old male with hemoptysis. EXAM: CHEST - 2 VIEW COMPARISON:  11/03/2016 and prior chest radiographs FINDINGS: Cardiomegaly noted. Diffuse airspace disease within both mid lungs and LEFT lower lobe noted. There is no evidence of pleural effusion or pneumothorax. No acute bony abnormality noted. IMPRESSION: Bilateral airspace opacities which are nonspecific but may represent pneumonia, aspiration, edema or hemorrhage. Radiographic follow-up to resolution is recommended. Cardiomegaly. Electronically Signed   By: Margarette Canada M.D.   On:  09/23/2017 19:04   Nm Pulmonary Vent And Perf (v/q Scan)  Result Date: 09/24/2017 CLINICAL DATA:  Worsening productive cough and shortness of breath. Possible pulmonary embolism. Small amount of hemoptysis. EXAM: NUCLEAR MEDICINE VENTILATION - PERFUSION LUNG SCAN TECHNIQUE: Ventilation images were obtained in multiple projections using inhaled aerosol Tc-3m DTPA. Perfusion images were obtained in multiple projections after intravenous injection of Tc-35m-MAA. RADIOPHARMACEUTICALS:  32.3 mCi of Tc-15m DTPA aerosol inhalation and 4.25 mCi Tc18m-MAA IV COMPARISON:  Chest x-ray 09/23/2017 and chest CT 04/06/2014 FINDINGS: Ventilation: Subtle patchy nonsegmental decreased uptake over the central lungs compatible with recent chest x-ray findings of bilateral airspace disease. Perfusion: No wedge shaped peripheral perfusion defects to suggest acute pulmonary embolism. IMPRESSION: Normal VQ scan without evidence of pulmonary embolism. Electronically Signed   By: Marin Olp  M.D.   On: 09/24/2017 09:02    Scheduled Meds: . aspirin  81 mg Oral Daily  . azithromycin  500 mg Oral Daily  . carvedilol  25 mg Oral BID WC  . cloNIDine  0.3 mg Oral TID  . furosemide  40 mg Intravenous BID  . insulin aspart  0-15 Units Subcutaneous TID WC  . magnesium oxide  400 mg Oral QHS  . mouth rinse  15 mL Mouth Rinse BID  . methadone  115 mg Oral Daily  . mycophenolate  540 mg Oral BID  . NIFEdipine  60 mg Oral Daily  . pantoprazole  40 mg Oral Daily  . pregabalin  100 mg Oral TID  . sodium chloride flush  3 mL Intravenous Q12H  . tacrolimus  1 mg Oral QHS  . tacrolimus  2 mg Oral Daily   Continuous Infusions: . sodium chloride    . cefTRIAXone (ROCEPHIN)  IV Stopped (09/25/17 0855)     LOS: 2 days   Time spent: 25 minutes.  Patrecia Pour, MD [facility] Pager [phone]  If 7PM-7AM, please contact night-coverage [email or web address] Password [email or web address] 09/25/2017, 10:29 AM

## 2017-09-25 NOTE — Plan of Care (Signed)
Patient is alert and oriented with wife at the bedside, 2L 02, up walking with a cane at baseline, plan to continue antibiotics, wean 02, and ambulate in the halls

## 2017-09-26 LAB — BASIC METABOLIC PANEL
Anion gap: 11 (ref 5–15)
BUN: 34 mg/dL — ABNORMAL HIGH (ref 6–20)
CO2: 24 mmol/L (ref 22–32)
Calcium: 8.9 mg/dL (ref 8.9–10.3)
Chloride: 99 mmol/L — ABNORMAL LOW (ref 101–111)
Creatinine, Ser: 2.05 mg/dL — ABNORMAL HIGH (ref 0.61–1.24)
GFR calc Af Amer: 39 mL/min — ABNORMAL LOW (ref >60)
GFR calc non Af Amer: 33 mL/min — ABNORMAL LOW (ref >60)
Glucose, Bld: 229 mg/dL — ABNORMAL HIGH (ref 65–99)
Potassium: 4.6 mmol/L (ref 3.5–5.1)
Sodium: 134 mmol/L — ABNORMAL LOW (ref 135–145)

## 2017-09-26 LAB — CBC
HCT: 36.3 % — ABNORMAL LOW (ref 39.0–52.0)
Hemoglobin: 11.2 g/dL — ABNORMAL LOW (ref 13.0–17.0)
MCH: 25.2 pg — ABNORMAL LOW (ref 26.0–34.0)
MCHC: 30.9 g/dL (ref 30.0–36.0)
MCV: 81.8 fL (ref 78.0–100.0)
Platelets: 263 10*3/uL (ref 150–400)
RBC: 4.44 MIL/uL (ref 4.22–5.81)
RDW: 14.7 % (ref 11.5–15.5)
WBC: 8.5 10*3/uL (ref 4.0–10.5)

## 2017-09-26 LAB — GLUCOSE, CAPILLARY
Glucose-Capillary: 223 mg/dL — ABNORMAL HIGH (ref 65–99)
Glucose-Capillary: 303 mg/dL — ABNORMAL HIGH (ref 65–99)
Glucose-Capillary: 161 mg/dL — ABNORMAL HIGH (ref 65–99)
Glucose-Capillary: 106 mg/dL — ABNORMAL HIGH (ref 65–99)

## 2017-09-26 MED ORDER — INSULIN ASPART 100 UNIT/ML ~~LOC~~ SOLN
0.0000 [IU] | Freq: Three times a day (TID) | SUBCUTANEOUS | Status: DC
  Administered 2017-09-26: 2 [IU] via SUBCUTANEOUS
  Administered 2017-09-27: 3 [IU] via SUBCUTANEOUS
  Administered 2017-09-27: 7 [IU] via SUBCUTANEOUS
  Administered 2017-09-28: 2 [IU] via SUBCUTANEOUS

## 2017-09-26 MED ORDER — INSULIN ASPART 100 UNIT/ML ~~LOC~~ SOLN: 0.0000 [IU] | Freq: Every day | SUBCUTANEOUS | Status: DC

## 2017-09-26 MED ORDER — FUROSEMIDE 10 MG/ML IJ SOLN
40.0000 mg | Freq: Every day | INTRAMUSCULAR | Status: DC
  Administered 2017-09-26: 40 mg via INTRAVENOUS
  Filled 2017-09-26: qty 4

## 2017-09-26 MED ORDER — INSULIN ASPART PROT & ASPART (70-30 MIX) 100 UNIT/ML ~~LOC~~ SUSP
20.0000 [IU] | Freq: Two times a day (BID) | SUBCUTANEOUS | Status: DC
  Administered 2017-09-26 – 2017-09-28 (×4): 20 [IU] via SUBCUTANEOUS
  Filled 2017-09-26: qty 10

## 2017-09-26 NOTE — Progress Notes (Signed)
PROGRESS NOTE  [name]  AJO:[id number] DOB: [birth year] DOA: 09/23/2017 PCP: Dorena Dew, FNP   Brief Narrative: [name] is a 62 y.o. male with a history of ESRD no longer on HD since renal transplant, HTN, T2DM, HTN, chronic AFib and chronic HFpEF who presented to the ED with a cough and hemoptysis associated with worsening shortness of breath. Symptoms worsened abruptly on the morning of admission in setting of viral URI symptoms. On arrival he was hypoxic with lower extremity edema which the patient reported chronically though has worsened recently with new orthopneic symptoms. BNP elevated to 374, troponin undetectable. WBC and lactic acid elevated and CXR demonstrated nonspecific bilateral airspace opacities. Blood cultures were obtained and antibiotics were started for pt on immunosuppressive therapy. D-dimer was found to be positive and heparin started empirically. V/Q scan has shown low probability of PE, so heparin was stopped.  Assessment & Plan: Active Problems:   DM (diabetes mellitus), type 2, uncontrolled ([place])   HTN (hypertension)   History of renal transplant   Atrial fibrillation (HCC)   Chronic diastolic CHF (congestive heart failure) (HCC)   Methadone use (HCC)   CHF exacerbation (HCC)   CKD (chronic kidney disease) stage 3, GFR 30-59 ml/min (HCC)   Acute CHF (congestive heart failure) ([place])  Acute hypoxic respiratory failure: Unclear etiology, though likely contributions by pulmonary edema and/or pneumonia. V/Q scan low probability and no wheezing on exam/hx COPD or tobacco use.  - Continue supplemental oxygen prn, wean as tolerated. OOB w/assistance w/cane.  - Continue prn nebulizers, no indication for steroids. Consider outpatient PFTs. - Repeat CXR 2v 5/19 shows improvement. Repeat 5/21. - Noted worsening at night, with obesity and pulmonary HTN noted on echo, ?OSA. Would benefit from sleep study.   Acute on chronic HFpEF: IVC dilated.  - Continue  lasix 40mg  IV daily, likely convert to po as creatinine is rising. - Monitor daily weights, I/O; 2.8L out and some weight down.  - Repeat echo is stable w/preserved EF no mention of diastolic function. LVH and pulm HTN noted.  - Continue telemetry while diuresing  Community acquired pneumonia: Bilateral bases possibly involved.  - Continue empiric abx (ceftriaxone, azithromycin). Leukocytosis resolved. - Monitor blood cultures - Sputum culture sent, negative gram stain - Cannot rely on PCT in this patient  Hemoptysis: Low volume. Most likely due to pneumonia. V/Q scan low risk. Age >50 but no tobacco history.  - Monitor CBC, if continues despite improvement in pulmonary infection, would need HRCT and/or pulm input  Stage III CKD, history of ESRD s/p renal transplant: - Monitor renal function with diuresis. Cr up w/diuresis and K up though mentions hemolysis. Will recheck BMP now and treat as indicated. - Continue anti-rejection medications tacrolimus, mycophenolate  HTN: Mildly elevated - Continue home coreg, clonidine, nifedipine  T2DM: Last HbA1c 7.1%.CBGs not controlled as inpatient. - SSI down to sensitive, will restart lower dose of pt's home 70/30.   Chronic AFib: - Continue coreg for rate control - Continue ASA (CHADSVASc would indicate anticoagulation though this was stopped per nephrology as outpatient)  Chronic pain:  - Continue home medications: methadone, lyrica  Left heel ulcer:  - Local wound care.   DVT prophylaxis: SCDs Code Status: Full Family Communication: Wife at bedside Disposition Plan: Home when improved.  Consultants:   None  Procedures:   Echocardiogram 5/18 - Left ventricle: The cavity size was normal. Wall thickness was   increased in a pattern of moderate LVH. Systolic function  was   normal. The estimated ejection fraction was in the range of 55%   to 60%. Wall motion was normal; there were no regional wall   motion abnormalities. -  Aortic valve: There was mild regurgitation. - Ascending aorta: The ascending aorta was mildly dilated. - Mitral valve: There was mild regurgitation. - Left atrium: The atrium was severely dilated. - Right atrium: The atrium was moderately dilated. - Pulmonary arteries: Systolic pressure was mildly to moderately   increased. PA peak pressure: 48 mm Hg (S).  Impressions: - Normal LV systolic function; moderate LVH; mild AI; mildly   dilated ascending aorta; mild MR; biatrial enlargement; mild TR   with mild to moderate pulmonary hypertension.  Antimicrobials:  Ceftriaxone, azithromycin 5/17 >>    Vancomycin 5/17 - 5/19  Subjective: Breathing better today than yesterday but still severely short of breath compared to baseline. Reports wheezing that is improved with breathing treatments. Swelling of legs is at baseline. No fever. No further hemoptysis.  Objective: Vitals:   09/26/17 0900 09/26/17 0959 09/26/17 1221 09/26/17 1225  BP: 134/74  133/83   Pulse: 77  75   Resp:   18   Temp:   98 F (36.7 C)   TempSrc:   Oral   SpO2:  94% 92% 94%  Weight:      Height:        Intake/Output Summary (Last 24 hours) at 09/26/2017 1311 Last data filed at 09/26/2017 1200 Gross per 24 hour  Intake 580 ml  Output 825 ml  Net -245 ml   Filed Weights   09/24/17 0001 09/25/17 0522 09/26/17 0505  Weight: 105.2 kg (232 lb) 104 kg (229 lb 4.8 oz) 104.8 kg (231 lb 1.6 oz)    Gen: 62 y.o. male catching his breath at sink in room Pulm: Mildly labored tachypnea. No wheezes or crackles noted. CV: Irreg irreg, rate in 80's. No murmur, rub, or gallop. No JVD, stable woody 1+ LE edema. GI: Abdomen soft, non-tender, non-distended, with normoactive bowel sounds. No organomegaly or masses felt. Ext: Warm, no deformities Skin: Small shallow left heel ulcer. Multiple surgical scars Neuro: Alert and oriented. No focal neurological deficits. Psych: Judgement and insight appear normal. Mood & affect  appropriate.   Data Reviewed: I have personally reviewed following labs and imaging studies  CBC: Recent Labs  Lab 09/23/17 1138 09/25/17 0730 09/26/17 0632  WBC 13.4* 8.9 8.5  HGB 13.2 11.9* 11.2*  HCT 43.8 39.2 36.3*  MCV 84.4 83.1 81.8  PLT 292 249 643   Basic Metabolic Panel: Recent Labs  Lab 09/23/17 1138 09/24/17 0328 09/25/17 0730 09/25/17 1036 09/26/17 0632  NA 137 136 133* 134* 134*  K 4.2 4.5 5.3* 4.4 4.6  CL 104 103 99* 100* 99*  CO2 23 25 22 24 24   GLUCOSE 127* 278* 268* 270* 229*  BUN 22* 23* 26* 26* 34*  CREATININE 1.69* 1.56* 1.90* 2.07* 2.05*  CALCIUM 8.9 8.7* 8.9 8.9 8.9   GFR: Estimated Creatinine Clearance: 46.6 mL/min (A) (by C-G formula based on SCr of 2.05 mg/dL (H)). Liver Function Tests: Recent Labs  Lab 09/23/17 1138  AST 19  ALT 16*  ALKPHOS 71  BILITOT 0.8  PROT 6.6  ALBUMIN 3.4*   Recent Labs  Lab 09/23/17 1138  LIPASE 20   No results for input(s): AMMONIA in the last 168 hours. Coagulation Profile: No results for input(s): INR, PROTIME in the last 168 hours. Cardiac Enzymes: No results for input(s): CKTOTAL, CKMB, CKMBINDEX,  TROPONINI in the last 168 hours. BNP (last 3 results) No results for input(s): PROBNP in the last 8760 hours. HbA1C: No results for input(s): HGBA1C in the last 72 hours. CBG: Recent Labs  Lab 09/25/17 1149 09/25/17 1604 09/25/17 2105 09/26/17 0746 09/26/17 1129  GLUCAP 271* 213* 147* 223* 303*   Lipid Profile: No results for input(s): CHOL, HDL, LDLCALC, TRIG, CHOLHDL, LDLDIRECT in the last 72 hours. Thyroid Function Tests: No results for input(s): TSH, T4TOTAL, FREET4, T3FREE, THYROIDAB in the last 72 hours. Anemia Panel: No results for input(s): VITAMINB12, FOLATE, FERRITIN, TIBC, IRON, RETICCTPCT in the last 72 hours. Urine analysis:    Component Value Date/Time   COLORURINE YELLOW 09/23/2017 2109   APPEARANCEUR CLEAR 09/23/2017 2109   LABSPEC 1.017 09/23/2017 2109   PHURINE 5.0  09/23/2017 2109   GLUCOSEU 150 (A) 09/23/2017 2109   HGBUR NEGATIVE 09/23/2017 2109   [place] NEGATIVE 09/23/2017 2109   KETONESUR NEGATIVE 09/23/2017 2109   PROTEINUR >=300 (A) 09/23/2017 2109   UROBILINOGEN 0.2 07/25/2017 1004   NITRITE NEGATIVE 09/23/2017 2109   LEUKOCYTESUR TRACE (A) 09/23/2017 2109   Recent Results (from the past 240 hour(s))  Culture, blood (routine x 2)     Status: None (Preliminary result)   Collection Time: 09/23/17  6:12 PM  Result Value Ref Range Status   Specimen Description BLOOD RIGHT ANTECUBITAL  Final   Special Requests   Final    BOTTLES DRAWN AEROBIC AND ANAEROBIC Blood Culture adequate volume   Culture   Final    NO GROWTH 2 DAYS Performed at [facility], [place] [address] [place], [place] [address]    Report Status PENDING  Incomplete  Culture, blood (routine x 2)     Status: None (Preliminary result)   Collection Time: 09/23/17  6:32 PM  Result Value Ref Range Status   Specimen Description BLOOD RIGHT WRIST  Final   Special Requests   Final    BOTTLES DRAWN AEROBIC AND ANAEROBIC Blood Culture adequate volume   Culture   Final    NO GROWTH 2 DAYS Performed at [facility], [place] [address], [place], [place] [address]    Report Status PENDING  Incomplete  MRSA PCR Screening     Status: None   Collection Time: 09/24/17  1:43 PM  Result Value Ref Range Status   MRSA by PCR NEGATIVE NEGATIVE Final    Comment:        The GeneXpert MRSA Assay (FDA approved for NASAL specimens only), is one component of a comprehensive MRSA colonization surveillance program. It is not intended to diagnose MRSA infection nor to guide or monitor treatment for MRSA infections. Performed at [facility], [place] [address], [place], [place] [address]   Culture, expectorated sputum-assessment     Status: None   Collection Time: 09/25/17  7:26 AM  Result Value Ref Range Status   Specimen Description SPUTUM  Final   Special Requests NONE   Final   Sputum evaluation   Final    THIS SPECIMEN IS ACCEPTABLE FOR SPUTUM CULTURE Performed at [facility], [address] [place], [place] [address]    Report Status 09/25/2017 FINAL  Final  Culture, respiratory (NON-Expectorated)     Status: None (Preliminary result)   Collection Time: 09/25/17  7:26 AM  Result Value Ref Range Status   Specimen Description SPUTUM  Final   Special Requests NONE Reflexed from [id number]  Final   Gram Stain  Final    ABUNDANT WBC PRESENT,BOTH PMN AND MONONUCLEAR NO SQUAMOUS EPITHELIAL CELLS SEEN NO ORGANISMS SEEN    Culture   Final    NO GROWTH < 24 HOURS Performed at [facility], [place] [address], [place], [place] [address]    Report Status PENDING  Incomplete      Radiology Studies: Dg Chest 2 View  Result Date: 09/25/2017 CLINICAL DATA:  Patient with acute respiratory failure.  Hypoxia. EXAM: CHEST - 2 VIEW COMPARISON:  Chest radiograph 09/23/2017. FINDINGS: Monitoring leads overlie the patient. Stable cardiomegaly. Slight interval improvement in previously described multifocal bilateral consolidative opacities, right greater than left. Small bilateral pleural effusions. No pneumothorax. Thoracic spine degenerative changes. IMPRESSION: Slight interval improvement in multifocal bilateral right-greater-than-left airspace opacities, potentially improving edema or infection. Recommend continued follow-up to ensure resolution. Electronically Signed   By: Lovey Newcomer M.D.   On: 09/25/2017 10:14    Scheduled Meds: . aspirin  81 mg Oral Daily  . carvedilol  25 mg Oral BID WC  . cloNIDine  0.3 mg Oral TID  . furosemide  40 mg Intravenous Daily  . insulin aspart  0-15 Units Subcutaneous TID WC  . magnesium oxide  400 mg Oral QHS  . mouth rinse  15 mL Mouth Rinse BID  . methadone  115 mg Oral Daily  . mycophenolate  540 mg Oral BID  . NIFEdipine  60 mg Oral Daily  . pantoprazole  40 mg Oral Daily  . pregabalin  100 mg Oral TID  . sodium  chloride flush  3 mL Intravenous Q12H  . tacrolimus  1 mg Oral QHS  . tacrolimus  2 mg Oral Daily   Continuous Infusions: . sodium chloride    . cefTRIAXone (ROCEPHIN)  IV Stopped (09/25/17 2255)     LOS: 3 days   Time spent: 25 minutes.  Patrecia Pour, MD [facility] Pager [phone]  If 7PM-7AM, please contact night-coverage [email or web address] Password [email or web address] 09/26/2017, 1:11 PM

## 2017-09-26 NOTE — Progress Notes (Addendum)
Inpatient Diabetes Program Recommendations  AACE/ADA: New Consensus Statement on Inpatient Glycemic Control (2015)  Target Ranges:  Prepandial:   less than 140 mg/dL      Peak postprandial:   less than 180 mg/dL (1-2 hours)      Critically ill patients:  140 - 180 mg/dL   Lab Results  Component Value Date   GLUCAP 303 (H) 09/26/2017   HGBA1C 7.1 07/25/2017    Review of Glycemic Control Results for [name], [name] (MRN [id number]) as of 09/26/2017 12:42  Ref. Range 09/25/2017 16:04 09/25/2017 21:05 09/26/2017 07:46 09/26/2017 11:29  Glucose-Capillary Latest Ref Range: 65 - 99 mg/dL 213 (H) 147 (H) 223 (H) 303 (H)   Diabetes history: Type 2 DM Outpatient Diabetes medications: Novolog 70/30 35 units in AM, 25 units in PM Current orders for Inpatient glycemic control: Novolog 0-15 units TID  Inpatient Diabetes Program Recommendations:    Am FSBG continues to exceed 180 mg/dL. Recommend adding back a portion of patient's home regimen, especially consider patient is eating at 100%. Consider Novolog 70/30 15 units BID at meals and decrease correction to sensitive TID.  Patient has regular diet ordered, consider carb modified?  Thanks, Bronson Curb, MSN, RNC-OB Diabetes Coordinator [phone] (8a-5p)

## 2017-09-26 NOTE — Progress Notes (Signed)
Pt was sink brushing teeth, DOE noted, lungs dminished but clear , ra sat 89-90%, reapplied 02 2l/[place] up to 95,

## 2017-09-27 ENCOUNTER — Inpatient Hospital Stay (HOSPITAL_COMMUNITY): Payer: Medicare Other

## 2017-09-27 LAB — CULTURE, RESPIRATORY: Culture: NORMAL

## 2017-09-27 LAB — BASIC METABOLIC PANEL
Anion gap: 11 (ref 5–15)
BUN: 35 mg/dL — ABNORMAL HIGH (ref 6–20)
CO2: 24 mmol/L (ref 22–32)
Calcium: 8.9 mg/dL (ref 8.9–10.3)
Chloride: 103 mmol/L (ref 101–111)
Creatinine, Ser: 1.84 mg/dL — ABNORMAL HIGH (ref 0.61–1.24)
GFR calc Af Amer: 44 mL/min — ABNORMAL LOW (ref >60)
GFR calc non Af Amer: 38 mL/min — ABNORMAL LOW (ref >60)
Glucose, Bld: 166 mg/dL — ABNORMAL HIGH (ref 65–99)
Potassium: 4.2 mmol/L (ref 3.5–5.1)
Sodium: 138 mmol/L (ref 135–145)

## 2017-09-27 LAB — CBC
HCT: 38.4 % — ABNORMAL LOW (ref 39.0–52.0)
Hemoglobin: 11.9 g/dL — ABNORMAL LOW (ref 13.0–17.0)
MCH: 25.1 pg — ABNORMAL LOW (ref 26.0–34.0)
MCHC: 31 g/dL (ref 30.0–36.0)
MCV: 80.8 fL (ref 78.0–100.0)
Platelets: 286 10*3/uL (ref 150–400)
RBC: 4.75 MIL/uL (ref 4.22–5.81)
RDW: 14.7 % (ref 11.5–15.5)
WBC: 6.6 10*3/uL (ref 4.0–10.5)

## 2017-09-27 LAB — GLUCOSE, CAPILLARY
Glucose-Capillary: 221 mg/dL — ABNORMAL HIGH (ref 65–99)
Glucose-Capillary: 339 mg/dL — ABNORMAL HIGH (ref 65–99)
Glucose-Capillary: 103 mg/dL — ABNORMAL HIGH (ref 65–99)
Glucose-Capillary: 122 mg/dL — ABNORMAL HIGH (ref 65–99)

## 2017-09-27 LAB — CULTURE, RESPIRATORY W GRAM STAIN

## 2017-09-27 MED ORDER — FUROSEMIDE 10 MG/ML IJ SOLN
40.0000 mg | Freq: Every day | INTRAMUSCULAR | Status: DC
  Administered 2017-09-27: 40 mg via INTRAVENOUS
  Filled 2017-09-27: qty 4

## 2017-09-27 NOTE — Progress Notes (Addendum)
PROGRESS NOTE  [name]  THY:[id number] DOB: 25-Apr-1956 DOA: 09/23/2017 PCP: Dorena Dew, FNP   Brief Narrative: [name] is a 62 y.o. male with a history of ESRD no longer on HD since renal transplant, HTN, T2DM, HTN, chronic AFib and chronic HFpEF who presented to the ED with a cough and hemoptysis associated with worsening shortness of breath. Symptoms worsened abruptly on the morning of admission in setting of viral URI symptoms. On arrival he was hypoxic with lower extremity edema which the patient reported chronically though has worsened recently with new orthopneic symptoms. BNP elevated to 374, troponin undetectable. WBC and lactic acid elevated and CXR demonstrated nonspecific bilateral airspace opacities. Blood cultures were obtained and antibiotics were started for pt on immunosuppressive therapy. D-dimer was found to be positive and heparin started empirically. V/Q scan has shown low probability of PE, so heparin was stopped. Lasix IV was continued with improvement in symptoms. Echo showed preserved EF with pulmonary HTN.   Assessment & Plan: Active Problems:   DM (diabetes mellitus), type 2, uncontrolled ([place])   HTN (hypertension)   History of renal transplant   Atrial fibrillation (HCC)   Chronic diastolic CHF (congestive heart failure) (HCC)   Methadone use (HCC)   CHF exacerbation (HCC)   CKD (chronic kidney disease) stage 3, GFR 30-59 ml/min (HCC)   Acute CHF (congestive heart failure) ([place])  Acute hypoxic respiratory failure: Unclear etiology, though likely contributions by pulmonary edema and/or pneumonia. V/Q scan low probability and no wheezing on exam/hx COPD or tobacco use.  - Continue supplemental oxygen prn, wean as tolerated, would ambulate w/pulse oximetry prior to discharge. OOB w/assistance w/cane.  - Continue prn nebulizers, no indication for steroids. Consider outpatient PFTs. - Repeat CXR 2v 5/19 and 5/21 showing interval improvement. - Noted  worsening at night, with obesity and pulmonary HTN noted on echo, ?OSA. Would benefit from sleep study.   Acute on chronic HFpEF: IVC dilated, edema improving on CXR. Repeat echo is stable w/preserved EF no mention of diastolic function. LVH and pulm HTN noted.  - Continue lasix 40mg  IV daily. Diuresis limited by renal status. - Monitor daily weights, I/O; suspect incompletely charted output. - Continue telemetry while diuresing  Community acquired pneumonia: Bilateral bases possibly involved. Immunosuppressed patient. - Continue empiric abx (ceftriaxone) s/p azithromycin 500mg  x3 days. Leukocytosis resolved. - Monitor blood cultures - Sputum culture sent, negative gram stain - Cannot rely on PCT in this patient  Hemoptysis: Low volume. Most likely due to pneumonia. V/Q scan low risk. Age >50 but no tobacco history.  - Has resolved. If returned, would need HRCT and/or pulm input  Stage III CKD, history of ESRD s/p renal transplant: - Monitor renal function with diuresis. - Continue anti-rejection medications tacrolimus, mycophenolate  HTN: Mildly elevated - Continue home coreg, clonidine, nifedipine  T2DM: Last HbA1c 7.1%.CBGs not controlled as inpatient. - SSI down to sensitive, will restart lower dose of pt's home 70/30.   Chronic AFib: - Continue coreg for rate control - Continue ASA (CHADSVASc would indicate anticoagulation though this was stopped per nephrology as outpatient)  Chronic pain:  - Continue home medications: methadone, lyrica. Will need MD note stating he was maintained on 115mg  daily dose of methadone while admitted.   Left heel ulcer:  - Local wound care.   DVT prophylaxis: SCDs Code Status: Full Family Communication: Wife at bedside Disposition Plan: Home when improved.  Consultants:   None  Procedures:   Echocardiogram 5/18 - Left  ventricle: The cavity size was normal. Wall thickness was   increased in a pattern of moderate LVH. Systolic function  was   normal. The estimated ejection fraction was in the range of 55%   to 60%. Wall motion was normal; there were no regional wall   motion abnormalities. - Aortic valve: There was mild regurgitation. - Ascending aorta: The ascending aorta was mildly dilated. - Mitral valve: There was mild regurgitation. - Left atrium: The atrium was severely dilated. - Right atrium: The atrium was moderately dilated. - Pulmonary arteries: Systolic pressure was mildly to moderately   increased. PA peak pressure: 48 mm Hg (S).  Impressions: - Normal LV systolic function; moderate LVH; mild AI; mildly   dilated ascending aorta; mild MR; biatrial enlargement; mild TR   with mild to moderate pulmonary hypertension.  Antimicrobials:  Ceftriaxone 5/17 >>    Azithromycin 5/17 - 5/21  Vancomycin 5/17 - 5/19  Subjective: Still a lot more dyspneic with exertion than baseline, but feels some improvement day to day. No chest pain. Wheezing has resolved. No further hemoptysis/cough  Objective: Vitals:   09/26/17 2314 09/27/17 0408 09/27/17 0625 09/27/17 0814  BP: 138/80 130/79  (!) 145/86  Pulse:  78  81  Resp:    18  Temp:  98.1 F (36.7 C)  98 F (36.7 C)  TempSrc:  Oral  Oral  SpO2:  96%  99%  Weight:   104.3 kg (230 lb 0.8 oz)   Height:   5\' 11"  (1.803 m)     Intake/Output Summary (Last 24 hours) at 09/27/2017 1328 Last data filed at 09/27/2017 1058 Gross per 24 hour  Intake 823 ml  Output 1200 ml  Net -377 ml   Filed Weights   09/25/17 0522 09/26/17 0505 09/27/17 0625  Weight: 104 kg (229 lb 4.8 oz) 104.8 kg (231 lb 1.6 oz) 104.3 kg (230 lb 0.8 oz)    Gen: 62 y.o. male sitting on edge of bed in no distress Pulm: Tachypneic and pushes for breath toward the end of longer sentences. No wheezes or crackles noted. CV: Irreg irreg, rate in 70's. No murmur, rub, or gallop. No JVD, stable symmetric woody 1+ LE edema. GI: Abdomen soft, non-tender, non-distended, with normoactive bowel  sounds. No organomegaly or masses felt. Ext: Warm, no deformities Skin: Small shallow left heel ulcer. Multiple surgical scars on abdomen. Neuro: Alert and oriented. No focal neurological deficits. Psych: Judgement and insight appear normal. Mood & affect appropriate.   Data Reviewed: I have personally reviewed following labs and imaging studies  CBC: Recent Labs  Lab 09/23/17 1138 09/25/17 0730 09/26/17 0632 09/27/17 0522  WBC 13.4* 8.9 8.5 6.6  HGB 13.2 11.9* 11.2* 11.9*  HCT 43.8 39.2 36.3* 38.4*  MCV 84.4 83.1 81.8 80.8  PLT 292 249 263 161   Basic Metabolic Panel: Recent Labs  Lab 09/24/17 0328 09/25/17 0730 09/25/17 1036 09/26/17 0632 09/27/17 0522  NA 136 133* 134* 134* 138  K 4.5 5.3* 4.4 4.6 4.2  CL 103 99* 100* 99* 103  CO2 25 22 24 24 24   GLUCOSE 278* 268* 270* 229* 166*  BUN 23* 26* 26* 34* 35*  CREATININE 1.56* 1.90* 2.07* 2.05* 1.84*  CALCIUM 8.7* 8.9 8.9 8.9 8.9   GFR: Estimated Creatinine Clearance: 51.8 mL/min (A) (by C-G formula based on SCr of 1.84 mg/dL (H)). Liver Function Tests: Recent Labs  Lab 09/23/17 1138  AST 19  ALT 16*  ALKPHOS 71  BILITOT 0.8  PROT  6.6  ALBUMIN 3.4*   Recent Labs  Lab 09/23/17 1138  LIPASE 20   No results for input(s): AMMONIA in the last 168 hours. Coagulation Profile: No results for input(s): INR, PROTIME in the last 168 hours. Cardiac Enzymes: No results for input(s): CKTOTAL, CKMB, CKMBINDEX, TROPONINI in the last 168 hours. BNP (last 3 results) No results for input(s): PROBNP in the last 8760 hours. HbA1C: No results for input(s): HGBA1C in the last 72 hours. CBG: Recent Labs  Lab 09/26/17 1129 09/26/17 1613 09/26/17 2146 09/27/17 0747 09/27/17 1200  GLUCAP 303* 161* 106* 221* 339*   Lipid Profile: No results for input(s): CHOL, HDL, LDLCALC, TRIG, CHOLHDL, LDLDIRECT in the last 72 hours. Thyroid Function Tests: No results for input(s): TSH, T4TOTAL, FREET4, T3FREE, THYROIDAB in the last  72 hours. Anemia Panel: No results for input(s): VITAMINB12, FOLATE, FERRITIN, TIBC, IRON, RETICCTPCT in the last 72 hours. Urine analysis:    Component Value Date/Time   COLORURINE YELLOW 09/23/2017 2109   APPEARANCEUR CLEAR 09/23/2017 2109   LABSPEC 1.017 09/23/2017 2109   PHURINE 5.0 09/23/2017 2109   GLUCOSEU 150 (A) 09/23/2017 2109   HGBUR NEGATIVE 09/23/2017 2109   [place] NEGATIVE 09/23/2017 2109   KETONESUR NEGATIVE 09/23/2017 2109   PROTEINUR >=300 (A) 09/23/2017 2109   UROBILINOGEN 0.2 07/25/2017 1004   NITRITE NEGATIVE 09/23/2017 2109   LEUKOCYTESUR TRACE (A) 09/23/2017 2109   Recent Results (from the past 240 hour(s))  Culture, blood (routine x 2)     Status: None (Preliminary result)   Collection Time: 09/23/17  6:12 PM  Result Value Ref Range Status   Specimen Description BLOOD RIGHT ANTECUBITAL  Final   Special Requests   Final    BOTTLES DRAWN AEROBIC AND ANAEROBIC Blood Culture adequate volume   Culture   Final    NO GROWTH 3 DAYS Performed at [facility], [place] [address], [place], [place] [address]    Report Status PENDING  Incomplete  Culture, blood (routine x 2)     Status: None (Preliminary result)   Collection Time: 09/23/17  6:32 PM  Result Value Ref Range Status   Specimen Description BLOOD RIGHT WRIST  Final   Special Requests   Final    BOTTLES DRAWN AEROBIC AND ANAEROBIC Blood Culture adequate volume   Culture   Final    NO GROWTH 3 DAYS Performed at [facility], [address] [place], [place] [address]    Report Status PENDING  Incomplete  MRSA PCR Screening     Status: None   Collection Time: 09/24/17  1:43 PM  Result Value Ref Range Status   MRSA by PCR NEGATIVE NEGATIVE Final    Comment:        The GeneXpert MRSA Assay (FDA approved for NASAL specimens only), is one component of a comprehensive MRSA colonization surveillance program. It is not intended to diagnose MRSA infection nor to guide or monitor treatment  for MRSA infections. Performed at [facility], [place] [address], [place], [place] [address]   Culture, expectorated sputum-assessment     Status: None   Collection Time: 09/25/17  7:26 AM  Result Value Ref Range Status   Specimen Description SPUTUM  Final   Special Requests NONE  Final   Sputum evaluation   Final    THIS SPECIMEN IS ACCEPTABLE FOR SPUTUM CULTURE Performed at [facility], [address] [place],  [address]    Report Status 09/25/2017 FINAL  Final  Culture, respiratory (NON-Expectorated)     Status: None (Preliminary result)   Collection Time: 09/25/17  7:26 AM  Result Value Ref Range Status   Specimen Description SPUTUM  Final   Special Requests NONE Reflexed from [id number]  Final   Gram Stain   Final    ABUNDANT WBC PRESENT,BOTH PMN AND MONONUCLEAR NO SQUAMOUS EPITHELIAL CELLS SEEN NO ORGANISMS SEEN    Culture   Final    RARE Consistent with normal respiratory flora. Performed at [facility], [place] [address], [place], [place] [address]    Report Status PENDING  Incomplete      Radiology Studies: Dg Chest 2 View  Result Date: 09/27/2017 CLINICAL DATA:  Acute respiratory failure, hypoxia, history of CHF and diabetes EXAM: CHEST - 2 VIEW COMPARISON:  Chest x-ray of 09/25/2017 FINDINGS: The lungs appear slightly better aerated. Haziness remains in the perihilar regions, right mid lung and left lung base. Again these findings are consistent with either residual edema or pneumonia. Small pleural effusions are present. Cardiomegaly is stable. No bony abnormality is seen. IMPRESSION: Slightly better aeration. Persistent patchy opacities and small effusions most consistent with residual edema although multifocal pneumonia cannot be excluded. Electronically Signed   By: Ivar Drape M.D.   On: 09/27/2017 09:24    Scheduled Meds: . aspirin  81 mg Oral Daily  . carvedilol  25 mg Oral BID WC  . cloNIDine  0.3 mg Oral TID  . insulin aspart  0-5 Units  Subcutaneous QHS  . insulin aspart  0-9 Units Subcutaneous TID WC  . insulin aspart protamine- aspart  20 Units Subcutaneous BID WC  . magnesium oxide  400 mg Oral QHS  . mouth rinse  15 mL Mouth Rinse BID  . methadone  115 mg Oral Daily  . mycophenolate  540 mg Oral BID  . NIFEdipine  60 mg Oral Daily  . pantoprazole  40 mg Oral Daily  . pregabalin  100 mg Oral TID  . sodium chloride flush  3 mL Intravenous Q12H  . tacrolimus  1 mg Oral QHS  . tacrolimus  2 mg Oral Daily   Continuous Infusions: . sodium chloride    . cefTRIAXone (ROCEPHIN)  IV Stopped (09/26/17 2112)     LOS: 4 days   Time spent: 25 minutes.  Patrecia Pour, MD [facility] Pager [phone]  If 7PM-7AM, please contact night-coverage [email or web address] Password [email or web address] 09/27/2017, 1:28 PM

## 2017-09-28 DIAGNOSIS — F1120 Opioid dependence, uncomplicated: Secondary | ICD-10-CM

## 2017-09-28 DIAGNOSIS — J9601 Acute respiratory failure with hypoxia: Secondary | ICD-10-CM

## 2017-09-28 DIAGNOSIS — E1165 Type 2 diabetes mellitus with hyperglycemia: Secondary | ICD-10-CM

## 2017-09-28 DIAGNOSIS — I482 Chronic atrial fibrillation: Secondary | ICD-10-CM

## 2017-09-28 DIAGNOSIS — I10 Essential (primary) hypertension: Secondary | ICD-10-CM

## 2017-09-28 DIAGNOSIS — N183 Chronic kidney disease, stage 3 (moderate): Secondary | ICD-10-CM

## 2017-09-28 LAB — GLUCOSE, CAPILLARY: Glucose-Capillary: 204 mg/dL — ABNORMAL HIGH (ref 65–99)

## 2017-09-28 LAB — CBC
HCT: 37.2 % — ABNORMAL LOW (ref 39.0–52.0)
Hemoglobin: 11.6 g/dL — ABNORMAL LOW (ref 13.0–17.0)
MCH: 25.6 pg — ABNORMAL LOW (ref 26.0–34.0)
MCHC: 31.2 g/dL (ref 30.0–36.0)
MCV: 81.9 fL (ref 78.0–100.0)
Platelets: 274 10*3/uL (ref 150–400)
RBC: 4.54 MIL/uL (ref 4.22–5.81)
RDW: 14.9 % (ref 11.5–15.5)
WBC: 5.9 10*3/uL (ref 4.0–10.5)

## 2017-09-28 LAB — BASIC METABOLIC PANEL
Anion gap: 10 (ref 5–15)
BUN: 33 mg/dL — ABNORMAL HIGH (ref 6–20)
CO2: 24 mmol/L (ref 22–32)
Calcium: 8.8 mg/dL — ABNORMAL LOW (ref 8.9–10.3)
Chloride: 103 mmol/L (ref 101–111)
Creatinine, Ser: 1.59 mg/dL — ABNORMAL HIGH (ref 0.61–1.24)
GFR calc Af Amer: 52 mL/min — ABNORMAL LOW (ref >60)
GFR calc non Af Amer: 45 mL/min — ABNORMAL LOW (ref >60)
Glucose, Bld: 189 mg/dL — ABNORMAL HIGH (ref 65–99)
Potassium: 4.2 mmol/L (ref 3.5–5.1)
Sodium: 137 mmol/L (ref 135–145)

## 2017-09-28 LAB — CULTURE, BLOOD (ROUTINE X 2)
Culture: NO GROWTH
Culture: NO GROWTH
Special Requests: ADEQUATE
Special Requests: ADEQUATE

## 2017-09-28 MED ORDER — AMOXICILLIN-POT CLAVULANATE 875-125 MG PO TABS: 1.0000 | ORAL_TABLET | Freq: Two times a day (BID) | ORAL | 0 refills | Status: AC

## 2017-09-28 NOTE — Progress Notes (Signed)
Pt and wife given avs, all questions enterained and answerd, letter given for meth clinic, pt dc home in stable condition, all belongins including cane with pt

## 2017-09-28 NOTE — Discharge Summary (Signed)
Physician Discharge Summary  CRISTO AUSBURN ATF:[id number] DOB: 1955-09-29 DOA: 09/23/2017  PCP: Dorena Dew, FNP  Admit date: 09/23/2017 Discharge date: 09/28/2017  Admitted From: Home.  Disposition:  Home.   Recommendations for Outpatient Follow-up:  1. Follow up with PCP in 1-2 weeks 2. Please obtain BMP/CBC in one week Please follow up with pulmonology as recommended for pulmonary function tests and sleep study.   Discharge Condition:stable.  CODE STATUS: full code.  Diet recommendation: Heart Healthy   Brief/Interim Summary: [name] is a 62 y.o. male with a history of ESRD no longer on HD since renal transplant, HTN, T2DM, HTN, chronic AFib and chronic HFpEF who presented to the ED with a cough and hemoptysis associated with worsening shortness of breath. Symptoms worsened abruptly on the morning of admission in setting of viral URI symptoms. On arrival he was hypoxic with lower extremity edema which the patient reported chronically though has worsened recently with new orthopneic symptoms. BNP elevated to 374, troponin undetectable. WBC and lactic acid elevated and CXR demonstrated nonspecific bilateral airspace opacities. Blood cultures were obtained and antibiotics were started for pt on immunosuppressive therapy. D-dimer was found to be positive and heparin started empirically. V/Q scan has shown low probability of PE, so heparin was stopped. Lasix IV was continued with improvement in symptoms. Echo showed preserved EF with pulmonary HTN.    Discharge Diagnoses:  Active Problems:   DM (diabetes mellitus), type 2, uncontrolled ([place])   HTN (hypertension)   History of renal transplant   Atrial fibrillation (HCC)   Chronic diastolic CHF (congestive heart failure) (HCC)   Methadone use (HCC)   CHF exacerbation (HCC)   CKD (chronic kidney disease) stage 3, GFR 30-59 ml/min (HCC)   Acute CHF (congestive heart failure) ([place])    Acute hypoxic respiratory failure: Unclear  etiology, though likely contributions by pulmonary edema and/or pneumonia. V/Q scan low probability and no wheezing on exam/hx COPD or tobacco use.  - able to wean him off oxygen .  - Continue prn nebulizers, no indication for steroids. Consider outpatient PFTs. - Repeat CXR 2v 5/19 and 5/21 showing interval improvement. - echocardiogram reviewed and   with obesity and pulmonary HTN noted on echo, ?OSA. Would benefit from sleep study. Recommend outpatient follow upw ith pulmonology for sleep study and PFT'S.   Acute on chronic HFpEF: IVC dilated, edema improving on CXR. Repeat echo is stable w/preserved EF no mention of diastolic function. LVH and pulm HTN noted.  - diuresed about 2.5 lit since admission, suspect the output hasnt been charted accurately.  - he reports feeling better, transitioned off oxygen and recommend outpatient follow up with cardiology.  - continue with oral lasix daily on discharge.     Community acquired pneumonia: Bilateral bases possibly involved. Immunosuppressed patient. s/p azithromycin 500mg  x3 days and was on rocephin, discharged on oral augmentin to complete the course.  Leukocytosis resolved. Blood cultures negative so far.  - Sputum culture sent, negative gram stain - Cannot rely on PCT in this patient  Hemoptysis: Low volume. Most likely due to pneumonia. V/Q scan low risk. Age >50 but no tobacco history.  - Has resolved. If returned, would need HRCT and/or pulm input  Stage III CKD, history of ESRD s/p renal transplant: - Monitor renal function with diuresis. - Continue anti-rejection medications tacrolimus, mycophenolate  HTN: Mildly elevated - Continue home coreg, clonidine, nifedipine  T2DM: Last HbA1c 7.1%.CBGs not controlled as inpatient. Resume home insulin dose.   Chronic  AFib: - Continue coreg for rate control - Continue ASA (CHADSVASc would indicate anticoagulation though this was stopped per nephrology as outpatient). Recommend  follo wup with cardiology regarding restarting the anti coagulation. Discussed with the patient.   Chronic pain:  - Continue home medications: methadone, lyrica.     Discharge Instructions  Discharge Instructions    Diet - low sodium heart healthy   Complete by:  As directed    Discharge instructions   Complete by:  As directed    Follow up with PCP in one week.  Follow up with pulmonology in one week.     Allergies as of 09/28/2017      Reactions   Tramadol Hives, Itching, Rash      Medication List    STOP taking these medications   omeprazole 40 MG capsule Commonly known as:  PRILOSEC     TAKE these medications   amoxicillin-clavulanate 875-125 MG tablet Commonly known as:  AUGMENTIN Take 1 tablet by mouth every 12 (twelve) hours for 5 days.   aspirin 81 MG chewable tablet Chew 81 mg by mouth daily.   carvedilol 25 MG tablet Commonly known as:  COREG Take 25 mg by mouth 2 (two) times daily with a meal.   cloNIDine 0.3 MG tablet Commonly known as:  CATAPRES Take 0.3 mg by mouth 3 (three) times daily.   furosemide 40 MG tablet Commonly known as:  LASIX Take 40 mg by mouth daily.   insulin aspart protamine- aspart (70-30) 100 UNIT/ML injection Commonly known as:  NOVOLOG MIX 70/30 Injects 45 units every morning and injects 40units every evening What changed:    how much to take  how to take this  when to take this  additional instructions   magnesium oxide 400 MG tablet Commonly known as:  MAG-OX Take 400 mg by mouth at bedtime.   methadone 10 MG/5ML solution Commonly known as:  DOLOPHINE Take 115 mg by mouth daily. Goes to a clinic every day   mycophenolate 180 MG EC tablet Commonly known as:  MYFORTIC Take 540 mg by mouth 2 (two) times daily.   NIFEdipine 60 MG 24 hr tablet Commonly known as:  PROCARDIA XL/ADALAT-CC Take 60 mg by mouth daily.   pantoprazole 40 MG tablet Commonly known as:  PROTONIX Take 1 tablet (40 mg total) by mouth  daily at 12 noon.   POLY-IRON 150 150 MG capsule Generic drug:  iron polysaccharides Take 150 mg by mouth daily.   pravastatin 40 MG tablet Commonly known as:  PRAVACHOL Take 40 mg by mouth every Monday, Wednesday, and Friday at 6 PM.   pregabalin 100 MG capsule Commonly known as:  LYRICA Take 100 mg by mouth 3 (three) times daily.   tacrolimus 1 MG capsule Commonly known as:  PROGRAF Take 1-2 mg by mouth See admin instructions. Takes 2 mg in the morning and 1 mg in the evening       Allergies  Allergen Reactions  . Tramadol Hives, Itching and Rash    Consultations:  None.    Procedures/Studies: Dg Chest 2 View  Result Date: 09/27/2017 CLINICAL DATA:  Acute respiratory failure, hypoxia, history of CHF and diabetes EXAM: CHEST - 2 VIEW COMPARISON:  Chest x-ray of 09/25/2017 FINDINGS: The lungs appear slightly better aerated. Haziness remains in the perihilar regions, right mid lung and left lung base. Again these findings are consistent with either residual edema or pneumonia. Small pleural effusions are present. Cardiomegaly is stable. No bony abnormality  is seen. IMPRESSION: Slightly better aeration. Persistent patchy opacities and small effusions most consistent with residual edema although multifocal pneumonia cannot be excluded. Electronically Signed   By: Ivar Drape M.D.   On: 09/27/2017 09:24   Dg Chest 2 View  Result Date: 09/25/2017 CLINICAL DATA:  Patient with acute respiratory failure.  Hypoxia. EXAM: CHEST - 2 VIEW COMPARISON:  Chest radiograph 09/23/2017. FINDINGS: Monitoring leads overlie the patient. Stable cardiomegaly. Slight interval improvement in previously described multifocal bilateral consolidative opacities, right greater than left. Small bilateral pleural effusions. No pneumothorax. Thoracic spine degenerative changes. IMPRESSION: Slight interval improvement in multifocal bilateral right-greater-than-left airspace opacities, potentially improving edema  or infection. Recommend continued follow-up to ensure resolution. Electronically Signed   By: Lovey Newcomer M.D.   On: 09/25/2017 10:14   Dg Chest 2 View  Result Date: 09/23/2017 CLINICAL DATA:  62 year old male with hemoptysis. EXAM: CHEST - 2 VIEW COMPARISON:  11/03/2016 and prior chest radiographs FINDINGS: Cardiomegaly noted. Diffuse airspace disease within both mid lungs and LEFT lower lobe noted. There is no evidence of pleural effusion or pneumothorax. No acute bony abnormality noted. IMPRESSION: Bilateral airspace opacities which are nonspecific but may represent pneumonia, aspiration, edema or hemorrhage. Radiographic follow-up to resolution is recommended. Cardiomegaly. Electronically Signed   By: Margarette Canada M.D.   On: 09/23/2017 19:04   Nm Pulmonary Vent And Perf (v/q Scan)  Result Date: 09/24/2017 CLINICAL DATA:  Worsening productive cough and shortness of breath. Possible pulmonary embolism. Small amount of hemoptysis. EXAM: NUCLEAR MEDICINE VENTILATION - PERFUSION LUNG SCAN TECHNIQUE: Ventilation images were obtained in multiple projections using inhaled aerosol Tc-48m DTPA. Perfusion images were obtained in multiple projections after intravenous injection of Tc-40m-MAA. RADIOPHARMACEUTICALS:  32.3 mCi of Tc-26m DTPA aerosol inhalation and 4.25 mCi Tc55m-MAA IV COMPARISON:  Chest x-ray 09/23/2017 and chest CT 04/06/2014 FINDINGS: Ventilation: Subtle patchy nonsegmental decreased uptake over the central lungs compatible with recent chest x-ray findings of bilateral airspace disease. Perfusion: No wedge shaped peripheral perfusion defects to suggest acute pulmonary embolism. IMPRESSION: Normal VQ scan without evidence of pulmonary embolism. Electronically Signed   By: Marin Olp M.D.   On: 09/24/2017 09:02     Subjective: No chest pain, sob.   Discharge Exam: Vitals:   09/27/17 2103 09/28/17 0432  BP: 138/72 139/80  Pulse: 83 80  Resp: 20 20  Temp: 97.9 F (36.6 C) 98.1 F (36.7  C)  SpO2: 90% 96%   Vitals:   09/27/17 0625 09/27/17 0814 09/27/17 2103 09/28/17 0432  BP:  (!) 145/86 138/72 139/80  Pulse:  81 83 80  Resp:  18 20 20   Temp:  98 F (36.7 C) 97.9 F (36.6 C) 98.1 F (36.7 C)  TempSrc:  Oral Oral Oral  SpO2:  99% 90% 96%  Weight: 104.3 kg (230 lb 0.8 oz)   103.9 kg (229 lb)  Height: 5\' 11"  (1.803 m)       General: Pt is alert, awake, not in acute distress Cardiovascular: RRR, S1/S2 +, no rubs, no gallops Respiratory: CTA bilaterally, no wheezing, no rhonchi Abdominal: Soft, NT, ND, bowel sounds + Extremities: no edema, no cyanosis    The results of significant diagnostics from this hospitalization (including imaging, microbiology, ancillary and laboratory) are listed below for reference.     Microbiology: Recent Results (from the past 240 hour(s))  Culture, blood (routine x 2)     Status: None (Preliminary result)   Collection Time: 09/23/17  6:12 PM  Result Value Ref Range  Status   Specimen Description BLOOD RIGHT ANTECUBITAL  Final   Special Requests   Final    BOTTLES DRAWN AEROBIC AND ANAEROBIC Blood Culture adequate volume   Culture   Final    NO GROWTH 4 DAYS Performed at [facility], [address], [place], [place] [address]    Report Status PENDING  Incomplete  Culture, blood (routine x 2)     Status: None (Preliminary result)   Collection Time: 09/23/17  6:32 PM  Result Value Ref Range Status   Specimen Description BLOOD RIGHT WRIST  Final   Special Requests   Final    BOTTLES DRAWN AEROBIC AND ANAEROBIC Blood Culture adequate volume   Culture   Final    NO GROWTH 4 DAYS Performed at [facility], [place] [address], [place], [place] [address]    Report Status PENDING  Incomplete  MRSA PCR Screening     Status: None   Collection Time: 09/24/17  1:43 PM  Result Value Ref Range Status   MRSA by PCR NEGATIVE NEGATIVE Final    Comment:        The GeneXpert MRSA Assay (FDA approved for NASAL specimens only),  is one component of a comprehensive MRSA colonization surveillance program. It is not intended to diagnose MRSA infection nor to guide or monitor treatment for MRSA infections. Performed at [facility], [place] [address], [place], [place] [address]   Culture, expectorated sputum-assessment     Status: None   Collection Time: 09/25/17  7:26 AM  Result Value Ref Range Status   Specimen Description SPUTUM  Final   Special Requests NONE  Final   Sputum evaluation   Final    THIS SPECIMEN IS ACCEPTABLE FOR SPUTUM CULTURE Performed at [facility], [address], [place], [place] [address]    Report Status 09/25/2017 FINAL  Final  Culture, respiratory (NON-Expectorated)     Status: None   Collection Time: 09/25/17  7:26 AM  Result Value Ref Range Status   Specimen Description SPUTUM  Final   Special Requests NONE Reflexed from [phone]  Final   Gram Stain   Final    ABUNDANT WBC PRESENT,BOTH PMN AND MONONUCLEAR NO SQUAMOUS EPITHELIAL CELLS SEEN NO ORGANISMS SEEN    Culture   Final    RARE Consistent with normal respiratory flora. Performed at [facility], [place] [address] [place], [place] [address]    Report Status 09/27/2017 FINAL  Final     Labs: BNP (last 3 results) Recent Labs    09/23/17 1832  BNP 280.0*   Basic Metabolic Panel: Recent Labs  Lab 09/25/17 0730 09/25/17 1036 09/26/17 0632 09/27/17 0522 09/28/17 0528  NA 133* 134* 134* 138 137  K 5.3* 4.4 4.6 4.2 4.2  CL 99* 100* 99* 103 103  CO2 22 24 24 24 24   GLUCOSE 268* 270* 229* 166* 189*  BUN 26* 26* 34* 35* 33*  CREATININE 1.90* 2.07* 2.05* 1.84* 1.59*  CALCIUM 8.9 8.9 8.9 8.9 8.8*   Liver Function Tests: Recent Labs  Lab 09/23/17 1138  AST 19  ALT 16*  ALKPHOS 71  BILITOT 0.8  PROT 6.6  ALBUMIN 3.4*   Recent Labs  Lab 09/23/17 1138  LIPASE 20   No results for input(s): AMMONIA in the last 168 hours. CBC: Recent Labs  Lab 09/23/17 1138 09/25/17 0730 09/26/17 0632  09/27/17 0522 09/28/17 0528  WBC 13.4* 8.9 8.5 6.6 5.9  HGB 13.2  11.9* 11.2* 11.9* 11.6*  HCT 43.8 39.2 36.3* 38.4* 37.2*  MCV 84.4 83.1 81.8 80.8 81.9  PLT 292 249 263 286 274   Cardiac Enzymes: No results for input(s): CKTOTAL, CKMB, CKMBINDEX, TROPONINI in the last 168 hours. BNP: Invalid input(s): POCBNP CBG: Recent Labs  Lab 09/27/17 0747 09/27/17 1200 09/27/17 1634 09/27/17 2216 09/28/17 0749  GLUCAP 221* 339* 103* 122* 204*   D-Dimer No results for input(s): DDIMER in the last 72 hours. Hgb A1c No results for input(s): HGBA1C in the last 72 hours. Lipid Profile No results for input(s): CHOL, HDL, LDLCALC, TRIG, CHOLHDL, LDLDIRECT in the last 72 hours. Thyroid function studies No results for input(s): TSH, T4TOTAL, T3FREE, THYROIDAB in the last 72 hours.  Invalid input(s): FREET3 Anemia work up No results for input(s): VITAMINB12, FOLATE, FERRITIN, TIBC, IRON, RETICCTPCT in the last 72 hours. Urinalysis    Component Value Date/Time   COLORURINE YELLOW 09/23/2017 2109   APPEARANCEUR CLEAR 09/23/2017 2109   LABSPEC 1.017 09/23/2017 2109   PHURINE 5.0 09/23/2017 2109   GLUCOSEU 150 (A) 09/23/2017 2109   HGBUR NEGATIVE 09/23/2017 2109   BILIRUBINUR NEGATIVE 09/23/2017 2109   KETONESUR NEGATIVE 09/23/2017 2109   PROTEINUR >=300 (A) 09/23/2017 2109   UROBILINOGEN 0.2 07/25/2017 1004   NITRITE NEGATIVE 09/23/2017 2109   LEUKOCYTESUR TRACE (A) 09/23/2017 2109   Sepsis Labs Invalid input(s): PROCALCITONIN,  WBC,  LACTICIDVEN Microbiology Recent Results (from the past 240 hour(s))  Culture, blood (routine x 2)     Status: None (Preliminary result)   Collection Time: 09/23/17  6:12 PM  Result Value Ref Range Status   Specimen Description BLOOD RIGHT ANTECUBITAL  Final   Special Requests   Final    BOTTLES DRAWN AEROBIC AND ANAEROBIC Blood Culture adequate volume   Culture   Final    NO GROWTH 4 DAYS Performed at [facility], [place] [address],  [place], [place] [address]    Report Status PENDING  Incomplete  Culture, blood (routine x 2)     Status: None (Preliminary result)   Collection Time: 09/23/17  6:32 PM  Result Value Ref Range Status   Specimen Description BLOOD RIGHT WRIST  Final   Special Requests   Final    BOTTLES DRAWN AEROBIC AND ANAEROBIC Blood Culture adequate volume   Culture   Final    NO GROWTH 4 DAYS Performed at [facility], [place] [address], [place], [place] [address]    Report Status PENDING  Incomplete  MRSA PCR Screening     Status: None   Collection Time: 09/24/17  1:43 PM  Result Value Ref Range Status   MRSA by PCR NEGATIVE NEGATIVE Final    Comment:        The GeneXpert MRSA Assay (FDA approved for NASAL specimens only), is one component of a comprehensive MRSA colonization surveillance program. It is not intended to diagnose MRSA infection nor to guide or monitor treatment for MRSA infections. Performed at [facility], [place] [address] [place], [place] [address]   Culture, expectorated sputum-assessment     Status: None   Collection Time: 09/25/17  7:26 AM  Result Value Ref Range Status   Specimen Description SPUTUM  Final   Special Requests NONE  Final   Sputum evaluation   Final    THIS SPECIMEN IS ACCEPTABLE FOR SPUTUM CULTURE Performed at [facility], [address], [place], [place] [address]    Report Status 09/25/2017 FINAL  Final  Culture, respiratory (NON-Expectorated)     Status: None   Collection Time: 09/25/17  7:26 AM  Result Value Ref Range Status   Specimen Description SPUTUM  Final   Special Requests NONE Reflexed from [id number]  Final   Gram Stain   Final    ABUNDANT WBC PRESENT,BOTH PMN AND MONONUCLEAR NO SQUAMOUS EPITHELIAL CELLS SEEN NO ORGANISMS SEEN    Culture   Final    RARE Consistent with normal respiratory flora. Performed at [facility], [place] [address], [place], [place] [address]    Report Status 09/27/2017 FINAL  Final     Time  coordinating discharge: 35 minutes  SIGNED:   Hosie Poisson, MD  [facility] 09/28/2017, 8:29 AM Pager   If 7PM-7AM, please contact night-coverage [email or web address] Password [email or web address]

## 2017-10-04 DIAGNOSIS — N529 Male erectile dysfunction, unspecified: Secondary | ICD-10-CM | POA: Diagnosis not present

## 2017-10-04 DIAGNOSIS — I4892 Unspecified atrial flutter: Secondary | ICD-10-CM | POA: Diagnosis not present

## 2017-10-04 DIAGNOSIS — I151 Hypertension secondary to other renal disorders: Secondary | ICD-10-CM | POA: Diagnosis not present

## 2017-10-04 DIAGNOSIS — N2889 Other specified disorders of kidney and ureter: Secondary | ICD-10-CM | POA: Diagnosis not present

## 2017-10-04 DIAGNOSIS — Z79899 Other long term (current) drug therapy: Secondary | ICD-10-CM | POA: Diagnosis not present

## 2017-10-04 DIAGNOSIS — Z940 Kidney transplant status: Secondary | ICD-10-CM | POA: Diagnosis not present

## 2017-10-04 DIAGNOSIS — K56609 Unspecified intestinal obstruction, unspecified as to partial versus complete obstruction: Secondary | ICD-10-CM | POA: Diagnosis not present

## 2017-10-04 DIAGNOSIS — E1122 Type 2 diabetes mellitus with diabetic chronic kidney disease: Secondary | ICD-10-CM | POA: Diagnosis not present

## 2017-10-04 DIAGNOSIS — E785 Hyperlipidemia, unspecified: Secondary | ICD-10-CM | POA: Diagnosis not present

## 2017-10-04 DIAGNOSIS — S90822A Blister (nonthermal), left foot, initial encounter: Secondary | ICD-10-CM | POA: Diagnosis not present

## 2017-10-04 DIAGNOSIS — G8929 Other chronic pain: Secondary | ICD-10-CM | POA: Diagnosis not present

## 2017-10-06 ENCOUNTER — Encounter: Payer: Self-pay | Admitting: Family Medicine

## 2017-10-06 ENCOUNTER — Ambulatory Visit (INDEPENDENT_AMBULATORY_CARE_PROVIDER_SITE_OTHER): Payer: Medicare Other | Admitting: Family Medicine

## 2017-10-06 VITALS — BP 150/72 | HR 108 | Temp 98.6°F | Resp 20 | Ht 71.0 in | Wt 235.0 lb

## 2017-10-06 DIAGNOSIS — G4730 Sleep apnea, unspecified: Secondary | ICD-10-CM | POA: Diagnosis not present

## 2017-10-06 DIAGNOSIS — Z8701 Personal history of pneumonia (recurrent): Secondary | ICD-10-CM

## 2017-10-06 DIAGNOSIS — R0609 Other forms of dyspnea: Secondary | ICD-10-CM

## 2017-10-06 DIAGNOSIS — I509 Heart failure, unspecified: Secondary | ICD-10-CM

## 2017-10-06 DIAGNOSIS — E1165 Type 2 diabetes mellitus with hyperglycemia: Secondary | ICD-10-CM | POA: Diagnosis not present

## 2017-10-06 DIAGNOSIS — R0600 Dyspnea, unspecified: Secondary | ICD-10-CM

## 2017-10-06 LAB — POCT GLYCOSYLATED HEMOGLOBIN (HGB A1C): Hemoglobin A1C: 7.4 % — AB (ref 4.0–5.6)

## 2017-10-06 MED ORDER — SPACER/AERO CHAMBER MOUTHPIECE MISC: 1.0000 | Freq: Four times a day (QID) | 0 refills | Status: AC | PRN

## 2017-10-06 MED ORDER — ALBUTEROL SULFATE HFA 108 (90 BASE) MCG/ACT IN AERS: 2.0000 | INHALATION_SPRAY | Freq: Four times a day (QID) | RESPIRATORY_TRACT | 2 refills | Status: DC | PRN

## 2017-10-06 MED ORDER — FUROSEMIDE 20 MG PO TABS: 20.0000 mg | ORAL_TABLET | Freq: Two times a day (BID) | ORAL | 5 refills | Status: DC
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
## 2017-10-06 NOTE — Patient Instructions (Signed)
For observed sleep apnea, I have ordered a split-night sleep study.  For congestive heart failure, restarted Lasix 20 mg twice daily.  We will recheck potassium level in 2 weeks. For dyspnea on exertion, I have sent a referral to pulmonology for further work-up and evaluation.  Also, I schedule a chest x-ray for 10/27/2017 to ensure resolution of bilateral lobe pneumonia.

## 2017-10-07 LAB — CBC WITH DIFFERENTIAL/PLATELET
Basophils Absolute: 0 10*3/uL (ref 0.0–0.2)
Basos: 0 %
EOS (ABSOLUTE): 0.3 10*3/uL (ref 0.0–0.4)
Eos: 3 %
Hematocrit: 40.9 % (ref 37.5–51.0)
Hemoglobin: 12.8 g/dL — ABNORMAL LOW (ref 13.0–17.7)
Immature Grans (Abs): 0 10*3/uL (ref 0.0–0.1)
Immature Granulocytes: 0 %
Lymphocytes Absolute: 1.1 10*3/uL (ref 0.7–3.1)
Lymphs: 11 %
MCH: 25.3 pg — ABNORMAL LOW (ref 26.6–33.0)
MCHC: 31.3 g/dL — ABNORMAL LOW (ref 31.5–35.7)
MCV: 81 fL (ref 79–97)
Monocytes Absolute: 0.6 10*3/uL (ref 0.1–0.9)
Monocytes: 6 %
Neutrophils Absolute: 7.5 10*3/uL — ABNORMAL HIGH (ref 1.4–7.0)
Neutrophils: 80 %
Platelets: 330 10*3/uL (ref 150–450)
RBC: 5.06 x10E6/uL (ref 4.14–5.80)
RDW: 16.4 % — ABNORMAL HIGH (ref 12.3–15.4)
WBC: 9.5 10*3/uL (ref 3.4–10.8)

## 2017-10-07 LAB — BASIC METABOLIC PANEL
BUN/Creatinine Ratio: 14 (ref 10–24)
BUN: 26 mg/dL (ref 8–27)
CO2: 18 mmol/L — ABNORMAL LOW (ref 20–29)
Calcium: 9.6 mg/dL (ref 8.6–10.2)
Chloride: 100 mmol/L (ref 96–106)
Creatinine, Ser: 1.9 mg/dL — ABNORMAL HIGH (ref 0.76–1.27)
GFR calc Af Amer: 43 mL/min/{1.73_m2} — ABNORMAL LOW (ref >59)
GFR calc non Af Amer: 37 mL/min/{1.73_m2} — ABNORMAL LOW (ref >59)
Glucose: 119 mg/dL — ABNORMAL HIGH (ref 65–99)
Potassium: 4.8 mmol/L (ref 3.5–5.2)
Sodium: 137 mmol/L (ref 134–144)

## 2017-10-10 ENCOUNTER — Telehealth: Payer: Self-pay

## 2017-10-10 NOTE — Telephone Encounter (Signed)
-----   Message from Dorena Dew, Edith Endave sent at 10/07/2017  4:19 PM EDT ----- Regarding: lab results Please inform patient that creatinine level is 1.9, which is consistent with stage 3 chronic kidney disease. Continue to follow up with nephrology as scheduled. No further medication changes warranted on today.   Donia Pounds  MSN, FNP-C [facility] [address] [place], [place] [address] [phone]

## 2017-10-10 NOTE — Telephone Encounter (Signed)
Called, no answer and mailbox was full. Will try later.

## 2017-10-11 NOTE — Telephone Encounter (Signed)
Called and spoke with patient, advised that creatinine level is 1.9 and is consistent with stage 3 chronic kidney disease. Advised to continue to follow up with nephrology as scheduled. No further medication changes are needing to be made at this time. Patient verbalized understanding. Thanks!

## 2017-10-18 ENCOUNTER — Ambulatory Visit (HOSPITAL_COMMUNITY)
Admission: RE | Admit: 2017-10-18 | Discharge: 2017-10-18 | Disposition: A | Discharge status: Home / Self Care | Payer: Medicare Other | Source: Ambulatory Visit | Attending: Vascular Surgery | Admitting: Vascular Surgery

## 2017-10-18 DIAGNOSIS — L97909 Non-pressure chronic ulcer of unspecified part of unspecified lower leg with unspecified severity: Secondary | ICD-10-CM | POA: Diagnosis not present

## 2017-10-18 DIAGNOSIS — I872 Venous insufficiency (chronic) (peripheral): Secondary | ICD-10-CM | POA: Diagnosis not present

## 2017-10-25 ENCOUNTER — Ambulatory Visit (HOSPITAL_COMMUNITY)
Admission: RE | Admit: 2017-10-25 | Discharge: 2017-10-25 | Disposition: A | Discharge status: Home / Self Care | Payer: Medicare Other | Source: Ambulatory Visit | Attending: Family Medicine | Admitting: Family Medicine

## 2017-10-25 DIAGNOSIS — Z8701 Personal history of pneumonia (recurrent): Secondary | ICD-10-CM | POA: Diagnosis not present

## 2017-10-25 DIAGNOSIS — R0600 Dyspnea, unspecified: Secondary | ICD-10-CM

## 2017-10-25 DIAGNOSIS — I517 Cardiomegaly: Secondary | ICD-10-CM | POA: Insufficient documentation

## 2017-10-25 DIAGNOSIS — J189 Pneumonia, unspecified organism: Secondary | ICD-10-CM | POA: Diagnosis not present

## 2017-10-25 DIAGNOSIS — R0609 Other forms of dyspnea: Secondary | ICD-10-CM | POA: Diagnosis not present

## 2017-10-26 ENCOUNTER — Ambulatory Visit (INDEPENDENT_AMBULATORY_CARE_PROVIDER_SITE_OTHER): Payer: Medicare Other | Admitting: Vascular Surgery

## 2017-10-26 ENCOUNTER — Encounter: Payer: Self-pay | Admitting: Vascular Surgery

## 2017-10-26 ENCOUNTER — Other Ambulatory Visit: Payer: Self-pay

## 2017-10-26 ENCOUNTER — Ambulatory Visit: Payer: Medicare Other | Admitting: Family Medicine

## 2017-10-26 ENCOUNTER — Ambulatory Visit (HOSPITAL_COMMUNITY)
Admission: RE | Admit: 2017-10-26 | Discharge: 2017-10-26 | Disposition: A | Discharge status: Home / Self Care | Payer: Medicare Other | Source: Ambulatory Visit | Attending: Vascular Surgery | Admitting: Vascular Surgery

## 2017-10-26 DIAGNOSIS — I890 Lymphedema, not elsewhere classified: Secondary | ICD-10-CM | POA: Insufficient documentation

## 2017-10-26 DIAGNOSIS — L97909 Non-pressure chronic ulcer of unspecified part of unspecified lower leg with unspecified severity: Secondary | ICD-10-CM | POA: Insufficient documentation

## 2017-10-26 DIAGNOSIS — I872 Venous insufficiency (chronic) (peripheral): Secondary | ICD-10-CM | POA: Diagnosis not present

## 2017-10-26 NOTE — Progress Notes (Signed)
Established Venous Insufficiency   History of Present Illness   [name] is a 62 y.o. (11/05/1955) male who presents with chief complaint: bilateral leg swelling.  Pt has had prior rapidly healing venous stasis ulcers.  The patient's symptoms have not progressed.  The patient's symptoms are: aching in both legs without obvious pattern or trigger.  The patient is compliant with compression stockings.  Pt returns for repeat studies as prior study was inadequate.    The patient's PMH, PSH, SH, and FamHx were reviewed on 10/26/17 are unchanged from 09/21/17.  Current Outpatient Medications  Medication Sig Dispense Refill  . albuterol (PROVENTIL HFA;VENTOLIN HFA) 108 (90 Base) MCG/ACT inhaler Inhale 2 puffs into the lungs every 6 (six) hours as needed for wheezing or shortness of breath. 1 Inhaler 2  . aspirin 81 MG chewable tablet Chew 81 mg by mouth daily.    . carvedilol (COREG) 25 MG tablet Take 25 mg by mouth 2 (two) times daily with a meal.     . cloNIDine (CATAPRES) 0.3 MG tablet Take 0.3 mg by mouth 3 (three) times daily.     . furosemide (LASIX) 20 MG tablet Take 1 tablet (20 mg total) by mouth 2 (two) times daily. 60 tablet 5  . insulin aspart protamine- aspart (NOVOLOG MIX 70/30) (70-30) 100 UNIT/ML injection Injects 45 units every morning and injects 40units every evening 10 mL 11  . magnesium oxide (MAG-OX) 400 MG tablet Take 400 mg by mouth at bedtime.     . methadone (DOLOPHINE) 10 MG/5ML solution Take 115 mg by mouth daily. Goes to a clinic every day     . mycophenolate (MYFORTIC) 180 MG EC tablet Take 540 mg by mouth 2 (two) times daily.     Marland Kitchen NIFEdipine (PROCARDIA XL/ADALAT-CC) 60 MG 24 hr tablet Take 60 mg by mouth daily.    . pantoprazole (PROTONIX) 40 MG tablet Take 1 tablet (40 mg total) by mouth daily at 12 noon. 30 tablet 1  . POLY-IRON 150 150 MG capsule Take 150 mg by mouth daily.  1  . pravastatin (PRAVACHOL) 40 MG tablet Take 40 mg by mouth every Monday,  Wednesday, and Friday at 6 PM.     . pregabalin (LYRICA) 100 MG capsule Take 100 mg by mouth 3 (three) times daily.     Marland Kitchen Spacer/Aero Chamber Mouthpiece MISC 1 each by Does not apply route every 6 (six) hours as needed. 1 each 0  . tacrolimus (PROGRAF) 1 MG capsule Take 1-2 mg by mouth See admin instructions. Takes 2 mg in the morning and 1 mg in the evening     No current facility-administered medications for this visit.     Allergies  Allergen Reactions  . Tramadol Hives, Itching and Rash    On ROS today: +chronic bilateral leg swelling, no intermittent claudication, no rest pain   Physical Examination   Vitals:   10/26/17 1551  BP: 134/78  Pulse: 87  Resp: 14  Temp: 97.7 F (36.5 C)  TempSrc: Oral  SpO2: 92%  Weight: 226 lb 12.8 oz (102.9 kg)   Body mass index is 31.63 kg/m.  General Alert, O x 3, WD, NAD  Pulmonary Sym exp, good B air movt, CTA B  Cardiac RRR, Nl S1, S2, no Murmurs, No rubs, No S3,S4  Vascular Vessel Right Left  Radial Palpable Palpable  Brachial Palpable Palpable  Carotid Palpable, No Bruit Palpable, No Bruit  Aorta Not palpable N/A  Femoral Palpable Palpable  Popliteal Not palpable Not palpable  PT Not palpable Not palpable  DP Faintly palpable Faintly palpable    Gastro- intestinal soft, non-distended, non-tender to palpation, No guarding or rebound, no HSM, no masses, no CVAT B, No palpable prominent aortic pulse,    Musculo- skeletal M/S 5/5 throughout  , Extremities without ischemic changes  , B woody edema today, No visible varicosities , No Lipodermatosclerosis present, no wounds today, all prior ulcers healed, +Stemmer-Karposi sign  Neurologic Cranial nerves 2-12 intact, Pain and light touch intact in extremities, Motor exam as listed above    Non-invasive Vascular Imaging   ABI (10/26/2017)  R:   ABI: 1.13,   PT: tri  DP: bi  TBI:  0.68  L:   ABI: 1.06,   PT: tri  DP: bi  TBI: 0.68   BLE Venous Insufficiency  Duplex: not done.   Medical Decision Making   [name] is a 62 y.o. 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  Patient will continue with compressive therapy.  I am also referring him to a lymphedema clinic for lymphatic massage and sequential compression.  Will have him come back in 3 months to see if any benefit obtained.  Thank you for allowing [place] to participate in this patient's care.   Adele Barthel, MD, FACS [facility] Office: [phone] Pager: [phone]

## 2017-10-27 ENCOUNTER — Ambulatory Visit (INDEPENDENT_AMBULATORY_CARE_PROVIDER_SITE_OTHER): Payer: Medicare Other | Admitting: Family Medicine

## 2017-10-27 VITALS — BP 124/70 | HR 83 | Temp 98.2°F | Resp 16 | Ht 71.0 in | Wt 223.0 lb

## 2017-10-27 DIAGNOSIS — R0600 Dyspnea, unspecified: Secondary | ICD-10-CM

## 2017-10-27 DIAGNOSIS — R630 Anorexia: Secondary | ICD-10-CM

## 2017-10-27 DIAGNOSIS — R6881 Early satiety: Secondary | ICD-10-CM

## 2017-10-27 DIAGNOSIS — R634 Abnormal weight loss: Secondary | ICD-10-CM

## 2017-10-27 DIAGNOSIS — I272 Pulmonary hypertension, unspecified: Secondary | ICD-10-CM | POA: Diagnosis not present

## 2017-10-27 DIAGNOSIS — R0609 Other forms of dyspnea: Secondary | ICD-10-CM

## 2017-10-31 ENCOUNTER — Encounter: Payer: Self-pay | Admitting: Family Medicine
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
## 2017-11-04 DIAGNOSIS — I272 Pulmonary hypertension, unspecified: Secondary | ICD-10-CM | POA: Diagnosis not present

## 2017-11-04 DIAGNOSIS — E669 Obesity, unspecified: Secondary | ICD-10-CM | POA: Diagnosis not present

## 2017-11-04 DIAGNOSIS — G8929 Other chronic pain: Secondary | ICD-10-CM | POA: Diagnosis not present

## 2017-11-04 DIAGNOSIS — E785 Hyperlipidemia, unspecified: Secondary | ICD-10-CM | POA: Diagnosis not present

## 2017-11-04 DIAGNOSIS — N2889 Other specified disorders of kidney and ureter: Secondary | ICD-10-CM | POA: Diagnosis not present

## 2017-11-04 DIAGNOSIS — I151 Hypertension secondary to other renal disorders: Secondary | ICD-10-CM | POA: Diagnosis not present

## 2017-11-04 DIAGNOSIS — E1122 Type 2 diabetes mellitus with diabetic chronic kidney disease: Secondary | ICD-10-CM | POA: Diagnosis not present

## 2017-11-04 DIAGNOSIS — N529 Male erectile dysfunction, unspecified: Secondary | ICD-10-CM | POA: Diagnosis not present

## 2017-11-04 DIAGNOSIS — Z79899 Other long term (current) drug therapy: Secondary | ICD-10-CM | POA: Diagnosis not present

## 2017-11-04 DIAGNOSIS — Z940 Kidney transplant status: Secondary | ICD-10-CM | POA: Diagnosis not present

## 2017-11-04 DIAGNOSIS — I4892 Unspecified atrial flutter: Secondary | ICD-10-CM | POA: Diagnosis not present

## 2017-11-16 ENCOUNTER — Ambulatory Visit (INDEPENDENT_AMBULATORY_CARE_PROVIDER_SITE_OTHER): Payer: Medicare Other | Admitting: Pulmonary Disease

## 2017-11-16 ENCOUNTER — Encounter: Payer: Self-pay | Admitting: Pulmonary Disease

## 2017-11-16 ENCOUNTER — Other Ambulatory Visit (INDEPENDENT_AMBULATORY_CARE_PROVIDER_SITE_OTHER): Payer: Medicare Other

## 2017-11-16 VITALS — BP 134/80 | HR 95 | Ht 70.0 in | Wt 219.0 lb

## 2017-11-16 DIAGNOSIS — R0602 Shortness of breath: Secondary | ICD-10-CM

## 2017-11-16 DIAGNOSIS — I509 Heart failure, unspecified: Secondary | ICD-10-CM | POA: Diagnosis not present

## 2017-11-16 DIAGNOSIS — I272 Pulmonary hypertension, unspecified: Secondary | ICD-10-CM

## 2017-11-16 LAB — BASIC METABOLIC PANEL
BUN: 23 mg/dL (ref 6–23)
CO2: 28 mEq/L (ref 19–32)
Calcium: 9.1 mg/dL (ref 8.4–10.5)
Chloride: 102 mEq/L (ref 96–112)
Creatinine, Ser: 1.5 mg/dL (ref 0.40–1.50)
GFR: 61.02 mL/min (ref >60.00)
Glucose, Bld: 218 mg/dL — ABNORMAL HIGH (ref 70–99)
Potassium: 4.4 mEq/L (ref 3.5–5.1)
Sodium: 138 mEq/L (ref 135–145)

## 2017-11-16 LAB — CBC WITH DIFFERENTIAL/PLATELET
Basophils Absolute: 0 10*3/uL (ref 0.0–0.1)
Basophils Relative: 0.7 % (ref 0.0–3.0)
Eosinophils Absolute: 0.3 10*3/uL (ref 0.0–0.7)
Eosinophils Relative: 5.3 % — ABNORMAL HIGH (ref 0.0–5.0)
HCT: 39.4 % (ref 39.0–52.0)
Hemoglobin: 12.6 g/dL — ABNORMAL LOW (ref 13.0–17.0)
Lymphocytes Relative: 15.9 % (ref 12.0–46.0)
Lymphs Abs: 0.9 10*3/uL (ref 0.7–4.0)
MCHC: 32 g/dL (ref 30.0–36.0)
MCV: 78.5 fl (ref 78.0–100.0)
Monocytes Absolute: 0.6 10*3/uL (ref 0.1–1.0)
Monocytes Relative: 10.1 % (ref 3.0–12.0)
Neutro Abs: 3.8 10*3/uL (ref 1.4–7.7)
Neutrophils Relative %: 68 % (ref 43.0–77.0)
Platelets: 241 10*3/uL (ref 150.0–400.0)
RBC: 5.02 Mil/uL (ref 4.22–5.81)
RDW: 17.3 % — ABNORMAL HIGH (ref 11.5–15.5)
WBC: 5.6 10*3/uL (ref 4.0–10.5)
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
## 2017-11-16 NOTE — Patient Instructions (Addendum)
Will refer you to cardiology for management of your heart failure, atrial fibrillation Increase your Lasix to 40 mg [2 tablets in the morning], 1 tablet in the evening We will review the sleep study when available Schedule you for pulmonary function test for evaluation of your lungs Check oxygen levels on exertion Check metabolic panel, CBC differential, blood allergy profile, proBNP  Follow-up in 1 to 2 months.

## 2017-11-17 LAB — RESPIRATORY ALLERGY PROFILE REGION II ~~LOC~~
Allergen, A. alternata, m6: <0.1 kU/L
Allergen, Cedar tree, t12: <0.1 kU/L
Allergen, Comm Silver Birch, t9: 0.54 kU/L — ABNORMAL HIGH
Allergen, Cottonwood, t14: <0.1 kU/L
Allergen, D pternoyssinus,d7: <0.1 kU/L
Allergen, Mouse Urine Protein, e78: <0.1 kU/L
Allergen, Mulberry, t76: <0.1 kU/L
Allergen, Oak,t7: 0.64 kU/L — ABNORMAL HIGH
Allergen, P. notatum, m1: <0.1 kU/L
Aspergillus fumigatus, m3: <0.1 kU/L
Bermuda Grass: <0.1 kU/L
Box Elder IgE: <0.1 kU/L
CLADOSPORIUM HERBARUM (M2) IGE: <0.1 kU/L
COMMON RAGWEED (SHORT) (W1) IGE: <0.1 kU/L
Cat Dander: 0.91 kU/L — ABNORMAL HIGH
Class: 0
Class: 0
Class: 0
Class: 0
Class: 0
Class: 0
Class: 0
Class: 0
Class: 0
Class: 0
Class: 0
Class: 0
Class: 0
Class: 0
Class: 0
Class: 0
Class: 0
Class: 0
Class: 0
Class: 0
Class: 1
Class: 1
Class: 2
Class: 2
Cockroach: 0.72 kU/L — ABNORMAL HIGH
D. farinae: <0.1 kU/L
Dog Dander: <0.1 kU/L
Elm IgE: 0.13 kU/L — ABNORMAL HIGH
IgE (Immunoglobulin E), Serum: 71 kU/L (ref <=114)
Johnson Grass: <0.1 kU/L
Pecan/Hickory Tree IgE: 0.18 kU/L — ABNORMAL HIGH
Rough Pigweed  IgE: <0.1 kU/L
Sheep Sorrel IgE: <0.1 kU/L
Timothy Grass: <0.1 kU/L

## 2017-11-17 LAB — INTERPRETATION:

## 2017-11-17 LAB — PRO B NATRIURETIC PEPTIDE: NT-Pro BNP: 1859 pg/mL — ABNORMAL HIGH (ref 0–210)

## 2017-11-30 ENCOUNTER — Telehealth: Payer: Self-pay | Admitting: Pulmonary Disease

## 2017-11-30 NOTE — Telephone Encounter (Signed)
Notes recorded by Shon Hale, CMA on 11/25/2017 at 11:43 AM EDT Letter has been mailed to address on file.  Unable to reach pt at home or mobile number on file, due to number being disconnected.  Nothing further is needed at this time. ------  Notes recorded by Shon Hale, CMA on 11/23/2017 at 2:19 PM EDT ATC pt on both numbers on file- home number is disconnected and mobile number is not accepting calls at this time.  Will call back. ------  Notes recorded by Elie Confer, CMA on 11/22/2017 at 1:59 PM EDT Attempted to call pt on home number and this has been disconnected. The mobile number is not accepting calls at this time. Wtc. ------  Notes recorded by Valerie Salts, CMA on 11/17/2017 at 3:29 PM EDT ATC patient. Both numbers were disconnected. Will try again later. ------  Notes recorded by Marshell Garfinkel, MD on 11/17/2017 at 1:49 PM EDT Please let patient know that labs show elevation in a marker called BNP which indicates heart failure Continue increased dose of Lasix as discussed yesterday  There is increased glucose levels of 218. Please follow-up with primary care regarding this. Make sure that he has the referral cardiology.   ATC patient-states call can not go through to given number.

## 2017-12-01 ENCOUNTER — Ambulatory Visit: Payer: Medicare Other | Admitting: Family Medicine

## 2017-12-07 ENCOUNTER — Ambulatory Visit: Payer: Medicare Other | Admitting: Vascular Surgery

## 2017-12-21 DIAGNOSIS — E119 Type 2 diabetes mellitus without complications: Secondary | ICD-10-CM | POA: Diagnosis not present

## 2017-12-21 DIAGNOSIS — I872 Venous insufficiency (chronic) (peripheral): Secondary | ICD-10-CM | POA: Diagnosis not present

## 2017-12-21 DIAGNOSIS — I10 Essential (primary) hypertension: Secondary | ICD-10-CM | POA: Diagnosis not present

## 2017-12-21 DIAGNOSIS — I890 Lymphedema, not elsewhere classified: Secondary | ICD-10-CM | POA: Diagnosis not present

## 2017-12-22 DIAGNOSIS — E119 Type 2 diabetes mellitus without complications: Secondary | ICD-10-CM | POA: Diagnosis not present

## 2017-12-22 DIAGNOSIS — Z940 Kidney transplant status: Secondary | ICD-10-CM | POA: Diagnosis not present

## 2017-12-22 DIAGNOSIS — N390 Urinary tract infection, site not specified: Secondary | ICD-10-CM | POA: Diagnosis not present

## 2018-01-02 ENCOUNTER — Inpatient Hospital Stay (HOSPITAL_COMMUNITY)
Admission: EM | Admit: 2018-01-02 | Discharge: 2018-01-05 | DRG: 917 | Disposition: A | Discharge status: Home Health | Payer: Medicare Other | Attending: Family Medicine | Admitting: Family Medicine

## 2018-01-02 ENCOUNTER — Other Ambulatory Visit: Payer: Self-pay

## 2018-01-02 ENCOUNTER — Emergency Department (HOSPITAL_COMMUNITY): Payer: Medicare Other

## 2018-01-02 ENCOUNTER — Encounter (HOSPITAL_COMMUNITY): Payer: Self-pay

## 2018-01-02 DIAGNOSIS — I4891 Unspecified atrial fibrillation: Secondary | ICD-10-CM | POA: Diagnosis present

## 2018-01-02 DIAGNOSIS — I5030 Unspecified diastolic (congestive) heart failure: Secondary | ICD-10-CM | POA: Diagnosis present

## 2018-01-02 DIAGNOSIS — E785 Hyperlipidemia, unspecified: Secondary | ICD-10-CM | POA: Diagnosis present

## 2018-01-02 DIAGNOSIS — R404 Transient alteration of awareness: Secondary | ICD-10-CM | POA: Diagnosis not present

## 2018-01-02 DIAGNOSIS — R0902 Hypoxemia: Secondary | ICD-10-CM | POA: Diagnosis not present

## 2018-01-02 DIAGNOSIS — Y830 Surgical operation with transplant of whole organ as the cause of abnormal reaction of the patient, or of later complication, without mention of misadventure at the time of the procedure: Secondary | ICD-10-CM | POA: Diagnosis present

## 2018-01-02 DIAGNOSIS — I130 Hypertensive heart and chronic kidney disease with heart failure and stage 1 through stage 4 chronic kidney disease, or unspecified chronic kidney disease: Secondary | ICD-10-CM | POA: Diagnosis present

## 2018-01-02 DIAGNOSIS — G4730 Sleep apnea, unspecified: Secondary | ICD-10-CM | POA: Diagnosis present

## 2018-01-02 DIAGNOSIS — E875 Hyperkalemia: Secondary | ICD-10-CM | POA: Diagnosis present

## 2018-01-02 DIAGNOSIS — E1122 Type 2 diabetes mellitus with diabetic chronic kidney disease: Secondary | ICD-10-CM | POA: Diagnosis present

## 2018-01-02 DIAGNOSIS — Z79891 Long term (current) use of opiate analgesic: Secondary | ICD-10-CM

## 2018-01-02 DIAGNOSIS — T68XXXA Hypothermia, initial encounter: Secondary | ICD-10-CM | POA: Diagnosis not present

## 2018-01-02 DIAGNOSIS — J9601 Acute respiratory failure with hypoxia: Secondary | ICD-10-CM | POA: Diagnosis present

## 2018-01-02 DIAGNOSIS — I5033 Acute on chronic diastolic (congestive) heart failure: Secondary | ICD-10-CM | POA: Diagnosis present

## 2018-01-02 DIAGNOSIS — I878 Other specified disorders of veins: Secondary | ICD-10-CM | POA: Diagnosis present

## 2018-01-02 DIAGNOSIS — N179 Acute kidney failure, unspecified: Secondary | ICD-10-CM | POA: Diagnosis present

## 2018-01-02 DIAGNOSIS — J811 Chronic pulmonary edema: Secondary | ICD-10-CM | POA: Diagnosis not present

## 2018-01-02 DIAGNOSIS — D72829 Elevated white blood cell count, unspecified: Secondary | ICD-10-CM | POA: Diagnosis present

## 2018-01-02 DIAGNOSIS — I161 Hypertensive emergency: Secondary | ICD-10-CM | POA: Diagnosis present

## 2018-01-02 DIAGNOSIS — B1920 Unspecified viral hepatitis C without hepatic coma: Secondary | ICD-10-CM | POA: Diagnosis present

## 2018-01-02 DIAGNOSIS — K219 Gastro-esophageal reflux disease without esophagitis: Secondary | ICD-10-CM | POA: Diagnosis present

## 2018-01-02 DIAGNOSIS — R680 Hypothermia, not associated with low environmental temperature: Secondary | ICD-10-CM | POA: Diagnosis present

## 2018-01-02 DIAGNOSIS — R0603 Acute respiratory distress: Secondary | ICD-10-CM | POA: Diagnosis not present

## 2018-01-02 DIAGNOSIS — R231 Pallor: Secondary | ICD-10-CM | POA: Diagnosis not present

## 2018-01-02 DIAGNOSIS — R0602 Shortness of breath: Secondary | ICD-10-CM | POA: Diagnosis not present

## 2018-01-02 DIAGNOSIS — G92 Toxic encephalopathy: Secondary | ICD-10-CM | POA: Diagnosis present

## 2018-01-02 DIAGNOSIS — R4182 Altered mental status, unspecified: Secondary | ICD-10-CM | POA: Diagnosis not present

## 2018-01-02 DIAGNOSIS — T8619 Other complication of kidney transplant: Secondary | ICD-10-CM | POA: Diagnosis present

## 2018-01-02 DIAGNOSIS — Z794 Long term (current) use of insulin: Secondary | ICD-10-CM

## 2018-01-02 DIAGNOSIS — T403X1A Poisoning by methadone, accidental (unintentional), initial encounter: Principal | ICD-10-CM | POA: Diagnosis present

## 2018-01-02 DIAGNOSIS — I890 Lymphedema, not elsewhere classified: Secondary | ICD-10-CM | POA: Diagnosis present

## 2018-01-02 DIAGNOSIS — E872 Acidosis: Secondary | ICD-10-CM | POA: Diagnosis present

## 2018-01-02 DIAGNOSIS — I272 Pulmonary hypertension, unspecified: Secondary | ICD-10-CM | POA: Diagnosis present

## 2018-01-02 DIAGNOSIS — R402362 Coma scale, best motor response, obeys commands, at arrival to emergency department: Secondary | ICD-10-CM | POA: Diagnosis present

## 2018-01-02 DIAGNOSIS — G8929 Other chronic pain: Secondary | ICD-10-CM | POA: Diagnosis present

## 2018-01-02 DIAGNOSIS — R402252 Coma scale, best verbal response, oriented, at arrival to emergency department: Secondary | ICD-10-CM | POA: Diagnosis present

## 2018-01-02 DIAGNOSIS — G9340 Encephalopathy, unspecified: Secondary | ICD-10-CM | POA: Diagnosis present

## 2018-01-02 DIAGNOSIS — R402132 Coma scale, eyes open, to sound, at arrival to emergency department: Secondary | ICD-10-CM | POA: Diagnosis present

## 2018-01-02 DIAGNOSIS — N183 Chronic kidney disease, stage 3 (moderate): Secondary | ICD-10-CM | POA: Diagnosis present

## 2018-01-02 DIAGNOSIS — G459 Transient cerebral ischemic attack, unspecified: Secondary | ICD-10-CM | POA: Diagnosis not present

## 2018-01-02 DIAGNOSIS — Z7982 Long term (current) use of aspirin: Secondary | ICD-10-CM

## 2018-01-02 DIAGNOSIS — R4020 Unspecified coma: Secondary | ICD-10-CM | POA: Diagnosis not present

## 2018-01-02 DIAGNOSIS — Z7951 Long term (current) use of inhaled steroids: Secondary | ICD-10-CM

## 2018-01-02 DIAGNOSIS — Z79899 Other long term (current) drug therapy: Secondary | ICD-10-CM

## 2018-01-02 LAB — RAPID URINE DRUG SCREEN, HOSP PERFORMED
Amphetamines: NOT DETECTED
Barbiturates: NOT DETECTED
Benzodiazepines: NOT DETECTED
Cocaine: NOT DETECTED
Opiates: NOT DETECTED
Tetrahydrocannabinol: NOT DETECTED

## 2018-01-02 LAB — URINALYSIS, ROUTINE W REFLEX MICROSCOPIC
Bilirubin Urine: NEGATIVE
Glucose, UA: NEGATIVE mg/dL
Ketones, ur: NEGATIVE mg/dL
Leukocytes, UA: NEGATIVE
Nitrite: NEGATIVE
Protein, ur: 100 mg/dL — AB
Specific Gravity, Urine: 1.013 (ref 1.005–1.030)
pH: 5 (ref 5.0–8.0)

## 2018-01-02 LAB — CBC WITH DIFFERENTIAL/PLATELET
Abs Immature Granulocytes: 0.1 10*3/uL (ref 0.0–0.1)
Basophils Absolute: 0 10*3/uL (ref 0.0–0.1)
Basophils Relative: 0 %
Eosinophils Absolute: 0.3 10*3/uL (ref 0.0–0.7)
Eosinophils Relative: 2 %
HCT: 49.9 % (ref 39.0–52.0)
Hemoglobin: 14.1 g/dL (ref 13.0–17.0)
Immature Granulocytes: 1 %
Lymphocytes Relative: 10 %
Lymphs Abs: 1.3 10*3/uL (ref 0.7–4.0)
MCH: 24.4 pg — ABNORMAL LOW (ref 26.0–34.0)
MCHC: 28.3 g/dL — ABNORMAL LOW (ref 30.0–36.0)
MCV: 86.2 fL (ref 78.0–100.0)
Monocytes Absolute: 0.7 10*3/uL (ref 0.1–1.0)
Monocytes Relative: 6 %
Neutro Abs: 10.3 10*3/uL — ABNORMAL HIGH (ref 1.7–7.7)
Neutrophils Relative %: 81 %
Platelets: 282 10*3/uL (ref 150–400)
RBC: 5.79 MIL/uL (ref 4.22–5.81)
RDW: 17.2 % — ABNORMAL HIGH (ref 11.5–15.5)
WBC: 12.6 10*3/uL — ABNORMAL HIGH (ref 4.0–10.5)

## 2018-01-02 LAB — COMPREHENSIVE METABOLIC PANEL
ALT: 12 U/L (ref 0–44)
AST: 19 U/L (ref 15–41)
Albumin: 3.7 g/dL (ref 3.5–5.0)
Alkaline Phosphatase: 70 U/L (ref 38–126)
Anion gap: 7 (ref 5–15)
BUN: 18 mg/dL (ref 8–23)
CO2: 26 mmol/L (ref 22–32)
Calcium: 9 mg/dL (ref 8.9–10.3)
Chloride: 105 mmol/L (ref 98–111)
Creatinine, Ser: 1.65 mg/dL — ABNORMAL HIGH (ref 0.61–1.24)
GFR calc Af Amer: 50 mL/min — ABNORMAL LOW (ref >60)
GFR calc non Af Amer: 43 mL/min — ABNORMAL LOW (ref >60)
Glucose, Bld: 170 mg/dL — ABNORMAL HIGH (ref 70–99)
Potassium: 5.3 mmol/L — ABNORMAL HIGH (ref 3.5–5.1)
Sodium: 138 mmol/L (ref 135–145)
Total Bilirubin: 0.6 mg/dL (ref 0.3–1.2)
Total Protein: 7.2 g/dL (ref 6.5–8.1)

## 2018-01-02 LAB — I-STAT ARTERIAL BLOOD GAS, ED
Acid-base deficit: 5 mmol/L — ABNORMAL HIGH (ref 0.0–2.0)
Bicarbonate: 23.4 mmol/L (ref 20.0–28.0)
O2 Saturation: 99 %
Patient temperature: 95.3
TCO2: 25 mmol/L (ref 22–32)
pCO2 arterial: 52.3 mmHg — ABNORMAL HIGH (ref 32.0–48.0)
pH, Arterial: 7.249 — ABNORMAL LOW (ref 7.350–7.450)
pO2, Arterial: 149 mmHg — ABNORMAL HIGH (ref 83.0–108.0)

## 2018-01-02 LAB — ETHANOL: Alcohol, Ethyl (B): <10 mg/dL (ref <10)

## 2018-01-02 LAB — PROCALCITONIN: Procalcitonin: 0.35 ng/mL

## 2018-01-02 LAB — I-STAT CG4 LACTIC ACID, ED: Lactic Acid, Venous: 1.41 mmol/L (ref 0.5–1.9)

## 2018-01-02 LAB — I-STAT TROPONIN, ED: Troponin i, poc: 0.04 ng/mL (ref 0.00–0.08)

## 2018-01-02 LAB — GLUCOSE, CAPILLARY
Glucose-Capillary: 165 mg/dL — ABNORMAL HIGH (ref 70–99)
Glucose-Capillary: 162 mg/dL — ABNORMAL HIGH (ref 70–99)

## 2018-01-02 LAB — MRSA PCR SCREENING: MRSA by PCR: NEGATIVE

## 2018-01-02 LAB — MAGNESIUM: Magnesium: 1.5 mg/dL — ABNORMAL LOW (ref 1.7–2.4)

## 2018-01-02 LAB — BRAIN NATRIURETIC PEPTIDE: B Natriuretic Peptide: 420.3 pg/mL — ABNORMAL HIGH (ref 0.0–100.0)

## 2018-01-02 LAB — CBG MONITORING, ED: Glucose-Capillary: 148 mg/dL — ABNORMAL HIGH (ref 70–99)

## 2018-01-02 LAB — TROPONIN I: Troponin I: 0.03 ng/mL (ref <0.03)

## 2018-01-02 LAB — LIPASE, BLOOD: Lipase: 24 U/L (ref 11–51)

## 2018-01-02 LAB — TSH: TSH: 0.971 u[IU]/mL (ref 0.350–4.500)

## 2018-01-02 MED ORDER — ALBUTEROL SULFATE (2.5 MG/3ML) 0.083% IN NEBU: 2.5000 mg | INHALATION_SOLUTION | Freq: Four times a day (QID) | RESPIRATORY_TRACT | Status: DC | PRN

## 2018-01-02 MED ORDER — NALOXONE HCL 2 MG/2ML IJ SOSY
PREFILLED_SYRINGE | INTRAMUSCULAR | Status: AC
  Filled 2018-01-02: qty 2

## 2018-01-02 MED ORDER — NALOXONE HCL 2 MG/2ML IJ SOSY
2.0000 mg | PREFILLED_SYRINGE | Freq: Once | INTRAMUSCULAR | Status: AC
  Administered 2018-01-02: 2 mg via INTRAVENOUS

## 2018-01-02 MED ORDER — HEPARIN SODIUM (PORCINE) 5000 UNIT/ML IJ SOLN
5000.0000 [IU] | Freq: Three times a day (TID) | INTRAMUSCULAR | Status: DC
  Administered 2018-01-03 – 2018-01-05 (×8): 5000 [IU] via SUBCUTANEOUS
  Filled 2018-01-02 (×8): qty 1

## 2018-01-02 MED ORDER — FAMOTIDINE IN NACL 20-0.9 MG/50ML-% IV SOLN
20.0000 mg | INTRAVENOUS | Status: DC
  Administered 2018-01-03: 20 mg via INTRAVENOUS
  Filled 2018-01-02: qty 50

## 2018-01-02 MED ORDER — ORAL CARE MOUTH RINSE
15.0000 mL | Freq: Two times a day (BID) | OROMUCOSAL | Status: DC
  Administered 2018-01-02 – 2018-01-04 (×4): 15 mL via OROMUCOSAL

## 2018-01-02 MED ORDER — NICARDIPINE HCL IN NACL 20-0.86 MG/200ML-% IV SOLN
INTRAVENOUS | Status: AC
  Filled 2018-01-02: qty 200

## 2018-01-02 MED ORDER — NICARDIPINE HCL IN NACL 20-0.86 MG/200ML-% IV SOLN
3.0000 mg/h | INTRAVENOUS | Status: DC
  Filled 2018-01-02: qty 200

## 2018-01-02 MED ORDER — NALOXONE HCL 0.4 MG/ML IJ SOLN: 0.4000 mg | Freq: Once | INTRAMUSCULAR | Status: DC

## 2018-01-02 MED ORDER — FUROSEMIDE 10 MG/ML IJ SOLN
80.0000 mg | Freq: Once | INTRAMUSCULAR | Status: AC
  Administered 2018-01-02: 80 mg via INTRAVENOUS
  Filled 2018-01-02: qty 8

## 2018-01-02 MED ORDER — SODIUM CHLORIDE 0.9 % IV SOLN
250.0000 mL | INTRAVENOUS | Status: DC | PRN
  Administered 2018-01-03 (×2): 250 mL via INTRAVENOUS

## 2018-01-02 MED ORDER — FUROSEMIDE 10 MG/ML IJ SOLN
INTRAMUSCULAR | Status: AC
  Filled 2018-01-02: qty 8

## 2018-01-02 MED ORDER — LABETALOL HCL 5 MG/ML IV SOLN
20.0000 mg | Freq: Once | INTRAVENOUS | Status: AC
  Administered 2018-01-02: 20 mg via INTRAVENOUS
  Filled 2018-01-02: qty 4

## 2018-01-02 MED ORDER — LORAZEPAM 2 MG/ML IJ SOLN: 2.0000 mg | Freq: Once | INTRAMUSCULAR | Status: DC

## 2018-01-02 MED ORDER — NALOXONE HCL 0.4 MG/ML IJ SOLN
0.4000 mg | Freq: Once | INTRAMUSCULAR | Status: AC
  Administered 2018-01-02: 2 mg via INTRAVENOUS

## 2018-01-02 MED ORDER — MIDAZOLAM HCL 2 MG/2ML IJ SOLN
2.0000 mg | Freq: Once | INTRAMUSCULAR | Status: AC
  Administered 2018-01-02: 2 mg via INTRAVENOUS

## 2018-01-02 MED ORDER — NALOXONE HCL 2 MG/2ML IJ SOSY: 2.0000 mg | PREFILLED_SYRINGE | Freq: Once | INTRAMUSCULAR | Status: DC

## 2018-01-02 MED ORDER — NALOXONE HCL 4 MG/10ML IJ SOLN
0.5000 mg/h | INTRAVENOUS | Status: DC
  Administered 2018-01-02: 0.25 mg/h via INTRAVENOUS
  Administered 2018-01-02 – 2018-01-03 (×2): 1 mg/h via INTRAVENOUS
  Administered 2018-01-03: 0.6 mg/h via INTRAVENOUS
  Filled 2018-01-02 (×5): qty 10

## 2018-01-02 MED ORDER — INSULIN ASPART 100 UNIT/ML ~~LOC~~ SOLN
0.0000 [IU] | SUBCUTANEOUS | Status: DC
  Administered 2018-01-02 (×2): 2 [IU] via SUBCUTANEOUS
  Administered 2018-01-03 (×3): 1 [IU] via SUBCUTANEOUS
  Administered 2018-01-03: 2 [IU] via SUBCUTANEOUS
  Administered 2018-01-03: 5 [IU] via SUBCUTANEOUS
  Administered 2018-01-03: 3 [IU] via SUBCUTANEOUS
  Administered 2018-01-04: 2 [IU] via SUBCUTANEOUS

## 2018-01-02 MED ORDER — SODIUM CHLORIDE 0.9 % IV SOLN
2.0000 g | INTRAVENOUS | Status: DC
  Administered 2018-01-03: 2 g via INTRAVENOUS
  Filled 2018-01-02: qty 20

## 2018-01-02 MED ORDER — SODIUM CHLORIDE 0.9 % IV SOLN
500.0000 mg | INTRAVENOUS | Status: DC
  Administered 2018-01-03: 500 mg via INTRAVENOUS
  Filled 2018-01-02: qty 500
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
## 2018-01-02 NOTE — Progress Notes (Signed)
ABG results were given to MD.

## 2018-01-02 NOTE — ED Notes (Signed)
cardine drip titrated to 75mg  at this time

## 2018-01-02 NOTE — ED Notes (Addendum)
Pt returned from scanner; pt verbalizing that he needs to have BM
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
## 2018-01-02 NOTE — ED Notes (Signed)
Temp foley inserted- patient responded to pain

## 2018-01-02 NOTE — ED Notes (Signed)
BP verified with manual

## 2018-01-02 NOTE — ED Triage Notes (Signed)
Patient arrived by [facility] for altered mental status and pinpoint pupils since 0830. Patient with ineffective respirations abd being bagged on EMS arrival. IV started immediately and 2 doses of narcan 2mg  administered. RT at bedside bagging and patient now awakening and groaning. EMS reports that spouse denis any drug use or any prescription narcotics. No signs of trauma

## 2018-01-02 NOTE — ED Notes (Signed)
cardine initiated at 5mg /hr

## 2018-01-02 NOTE — ED Notes (Signed)
Pt's eyes are opening and he is now combative. Nonrebreather applied.

## 2018-01-03 ENCOUNTER — Inpatient Hospital Stay (HOSPITAL_COMMUNITY): Payer: Medicare Other

## 2018-01-03 LAB — GLUCOSE, CAPILLARY
Glucose-Capillary: 139 mg/dL — ABNORMAL HIGH (ref 70–99)
Glucose-Capillary: 127 mg/dL — ABNORMAL HIGH (ref 70–99)
Glucose-Capillary: 141 mg/dL — ABNORMAL HIGH (ref 70–99)
Glucose-Capillary: 179 mg/dL — ABNORMAL HIGH (ref 70–99)
Glucose-Capillary: 201 mg/dL — ABNORMAL HIGH (ref 70–99)
Glucose-Capillary: 270 mg/dL — ABNORMAL HIGH (ref 70–99)

## 2018-01-03 LAB — RENAL FUNCTION PANEL
Albumin: 3.1 g/dL — ABNORMAL LOW (ref 3.5–5.0)
Anion gap: 9 (ref 5–15)
BUN: 21 mg/dL (ref 8–23)
CO2: 25 mmol/L (ref 22–32)
Calcium: 9.1 mg/dL (ref 8.9–10.3)
Chloride: 104 mmol/L (ref 98–111)
Creatinine, Ser: 1.55 mg/dL — ABNORMAL HIGH (ref 0.61–1.24)
GFR calc Af Amer: 54 mL/min — ABNORMAL LOW (ref >60)
GFR calc non Af Amer: 47 mL/min — ABNORMAL LOW (ref >60)
Glucose, Bld: 165 mg/dL — ABNORMAL HIGH (ref 70–99)
Phosphorus: 2.6 mg/dL (ref 2.5–4.6)
Potassium: 4.1 mmol/L (ref 3.5–5.1)
Sodium: 138 mmol/L (ref 135–145)

## 2018-01-03 LAB — URINE CULTURE: Culture: NO GROWTH

## 2018-01-03 LAB — CBC
HCT: 42.4 % (ref 39.0–52.0)
Hemoglobin: 12.8 g/dL — ABNORMAL LOW (ref 13.0–17.0)
MCH: 24.3 pg — ABNORMAL LOW (ref 26.0–34.0)
MCHC: 30.2 g/dL (ref 30.0–36.0)
MCV: 80.6 fL (ref 78.0–100.0)
Platelets: 226 10*3/uL (ref 150–400)
RBC: 5.26 MIL/uL (ref 4.22–5.81)
RDW: 16.7 % — ABNORMAL HIGH (ref 11.5–15.5)
WBC: 9.5 10*3/uL (ref 4.0–10.5)

## 2018-01-03 LAB — HIV ANTIBODY (ROUTINE TESTING W REFLEX): HIV Screen 4th Generation wRfx: NONREACTIVE

## 2018-01-03 MED ORDER — TACROLIMUS 1 MG PO CAPS: 1.0000 mg | ORAL_CAPSULE | ORAL | Status: DC

## 2018-01-03 MED ORDER — NIFEDIPINE ER OSMOTIC RELEASE 30 MG PO TB24
60.0000 mg | ORAL_TABLET | Freq: Every day | ORAL | Status: DC
  Administered 2018-01-03 – 2018-01-04 (×2): 60 mg via ORAL
  Filled 2018-01-03: qty 2
  Filled 2018-01-03: qty 1

## 2018-01-03 MED ORDER — TACROLIMUS 1 MG PO CAPS
2.0000 mg | ORAL_CAPSULE | Freq: Every day | ORAL | Status: DC
  Administered 2018-01-03 – 2018-01-04 (×2): 2 mg via ORAL
  Filled 2018-01-03 (×2): qty 2

## 2018-01-03 MED ORDER — CLONIDINE HCL 0.2 MG PO TABS
0.3000 mg | ORAL_TABLET | Freq: Three times a day (TID) | ORAL | Status: DC
  Administered 2018-01-03 – 2018-01-04 (×6): 0.3 mg via ORAL
  Filled 2018-01-03: qty 1
  Filled 2018-01-03 (×2): qty 3
  Filled 2018-01-03 (×3): qty 1

## 2018-01-03 MED ORDER — PRAVASTATIN SODIUM 40 MG PO TABS
40.0000 mg | ORAL_TABLET | Freq: Every day | ORAL | Status: DC
  Administered 2018-01-03 – 2018-01-04 (×2): 40 mg via ORAL
  Filled 2018-01-03 (×2): qty 1

## 2018-01-03 MED ORDER — MYCOPHENOLATE SODIUM 180 MG PO TBEC
540.0000 mg | DELAYED_RELEASE_TABLET | Freq: Two times a day (BID) | ORAL | Status: DC
  Administered 2018-01-03 – 2018-01-04 (×4): 540 mg via ORAL
  Filled 2018-01-03 (×5): qty 3

## 2018-01-03 MED ORDER — FUROSEMIDE 10 MG/ML IJ SOLN
40.0000 mg | Freq: Once | INTRAMUSCULAR | Status: AC
  Administered 2018-01-03: 40 mg via INTRAVENOUS
  Filled 2018-01-03: qty 4

## 2018-01-03 MED ORDER — TACROLIMUS 1 MG PO CAPS
1.0000 mg | ORAL_CAPSULE | Freq: Every day | ORAL | Status: DC
  Administered 2018-01-03 – 2018-01-04 (×2): 1 mg via ORAL
  Filled 2018-01-03 (×3): qty 1

## 2018-01-03 MED ORDER — MAGNESIUM OXIDE 400 (241.3 MG) MG PO TABS
400.0000 mg | ORAL_TABLET | Freq: Every day | ORAL | Status: DC
  Administered 2018-01-03 – 2018-01-04 (×2): 400 mg via ORAL
  Filled 2018-01-03 (×2): qty 1

## 2018-01-03 MED ORDER — POLYSACCHARIDE IRON COMPLEX 150 MG PO CAPS
150.0000 mg | ORAL_CAPSULE | Freq: Every day | ORAL | Status: DC
  Administered 2018-01-03 – 2018-01-04 (×2): 150 mg via ORAL
  Filled 2018-01-03 (×2): qty 1

## 2018-01-03 MED ORDER — ASPIRIN EC 81 MG PO TBEC
81.0000 mg | DELAYED_RELEASE_TABLET | Freq: Every day | ORAL | Status: DC
  Administered 2018-01-03 – 2018-01-04 (×2): 81 mg via ORAL
  Filled 2018-01-03 (×2): qty 1

## 2018-01-03 MED ORDER — CARVEDILOL 25 MG PO TABS
25.0000 mg | ORAL_TABLET | Freq: Two times a day (BID) | ORAL | Status: DC
  Administered 2018-01-03 – 2018-01-05 (×4): 25 mg via ORAL
  Filled 2018-01-03 (×2): qty 1
  Filled 2018-01-03: qty 2
  Filled 2018-01-03: qty 1

## 2018-01-03 MED ORDER — PANTOPRAZOLE SODIUM 40 MG PO TBEC
40.0000 mg | DELAYED_RELEASE_TABLET | Freq: Every day | ORAL | Status: DC
  Administered 2018-01-03 – 2018-01-04 (×2): 40 mg via ORAL
  Filled 2018-01-03 (×2): qty 1

## 2018-01-03 NOTE — Progress Notes (Addendum)
[name]  ZHG:[id number] DOB: [birth year] DOA: 01/02/2018 PCP: Dorena Dew, FNP    Reason for Consult / Chief Complaint:  Altered mental status  Consulting MD:  Dr. Sherry Ruffing  HPI/Brief Narrative   HPI obtained from medical chart review as patient is encephalopathic and unable to provide.  Patient's significant other at bedside and able to give some history.  62 year old male with PMH significant for kidney transplant (secondary to uncontrolled HTN/ DM), hypertension, atrial fibrillation (not on AC), diastolic heart failure, pulmonary hypertension, DM2, sleep apnea, chronic lymphedema  venous stasis, hepatitis C, and chronic pain on methadone.  Girlfriend states that he was in his normal state of health 8/25 and they had a normal morning which consist of going every Monday to the methadone clinic 8/26. They came home, had their coffee, and then she said he got very sleepy and started foaming at the mouth. She states he is compliant with all his medications and denies any misuse.  Denies ETOH, illicit drug, and tobacco abuse   EMS found patient with with decreased mental status, pinpoint pupils, and ineffective respirations requiring bagging.  He was given 2 doses of narcan and woke up and briefly became combative.  He denied any drug abuse.  He was also hypertensive requiring labetalol and was placed on Cardene gtt for which is now off. CT head non acute.  He was placed on narcan gtt for ongoing pinpoint pupils and decreased mental status.  CXR shows acute pulmonary edema, requiring ventri mask, and was given lasix 80mg .  PCCM called for admit.   Subjective  No acute events overnight.  Pt remains on narcan gtt.  Pt reports no changes in his medications, no recent increases.  His significant other reports she gave him his normal routine meds early after his methadone dosing.     Assessment & Plan:   Acute encephalopathy  - CTH negative - UDS, ETOH negative - pinpoint pupils and  mental status improved with narcan - UA negative - suspected some form of unintentional overdose, but also can not rule out sepsis component.  ? If this was change in med routine + methadone administration as culprit  P:  Hold Narcan gtt, if remains stable off, transfer out of ICU  Follow neuro exam off narcan   Acute hypoxic respiratory failure in the setting of acute pulmonary edema and acute encephalopathy  Hx sleep apnea (not on CPAP) - ABG c/w acute resp acidosis - breathing improved after narcan gtt and diuresis  - CXR c/w pulm edema  P:  O2 as needed to support sats > 90% D2/x empiric CAP coverage Follow CXR  Diastolic HF  Hypertensive Emergency  Hx Pulm HTN, HTN, HLD - last TTE 09/2017 - EF 55-60%, LVH, mild AR, mild MR, dilated LA, RA, PAP 48 mm Hg - apparently has not followed up with cardiology at [facility] since 2016, not on AC - afib is currently rate controlled P:  Tele monitoring  Repeat lasix 40 mg IV x1 Resume home ASA, pravastatin, coreg, nifedipime  AKI - near baseline 1.5 - 1.9 Hx of renal transplant  Mild hyperkalemia P:  Discontinue foley  Trend BMP / urinary output Replace electrolytes as indicated Avoid nephrotoxic agents as able, ensure adequate renal perfusion Resume home prograf, myfortic   Mild leukocytosis - possibly reactive but given hypothermia will empirically treat given immunosuppression  P:  Follow cultures Continue abx, D2/x  DM P:  CBG Q4 with SSI  Chronic Pain P:  Hold home methadone, lyrica for next 24 hours Would observe on home dosing prior to discharge to ensure no difficulty   Hep C P:  Monitor LFT's intermittently    Best Practice / Goals of Care / Disposition.   DVT prophylaxis:  Heparin/ SCDs GI prophylaxis: pepcid Diet: Heart healthy / carb modified  Mobility: BR Code Status: full Family Communication:  Patient and significant other Mardene Celeste) updated at bedside.  Note on admit, the significant other had slurred  speech and fell asleep during conversation with provider.   Disposition / Summary of Today's Plan 01/03/18   Turn narcan gtt off.  Resume home antihypertensives.  Additional dose IV lasix 8/27.  If remains stable off narcan, will transfer to tele.  Advance diet. To [facility] 8/28.    Consultants:    Significant Diagnostic Tests: 8/26  [facility] >> Chronic nonspecific areas of white matter hypoattenuation. No acute findings. Chronic paranasal sinus disease as above.  Micro Data: 8/26 MRSA PCR >> negative  8/26 BC x 2 >> 8/26 UA >> negative  8/26 UC >>  Antimicrobials:  8/26 azithro >> 8/26 ceftriaxone >>  Objective    Examination: General:  Adult male in NAD lying in bed HEENT: MM pink/moist Neuro: AAOx4, speech clear, MAE  CV: s1s2 irr irr, no m/r/g, AF on monitor  PULM: even/non-labored, lungs bilaterally clear anterior, basilar crackles  HE:NIDP, non-tender, bsx4 active  Extremities: warm/dry, trace LE edema, old scar on left knee Skin: no rashes or lesions  Blood pressure (!) 142/83, pulse 85, temperature 98.5 F (36.9 C), temperature source Axillary, resp. rate 13, height 5\' 10"  (1.778 m), weight 97.6 kg, SpO2 99 %.    FiO2 (%):  [55 %-100 %] 55 %   Intake/Output Summary (Last 24 hours) at 01/03/2018 1007 Last data filed at 01/03/2018 1000 Gross per 24 hour  Intake 890.43 ml  Output 2290 ml  Net -1399.57 ml   Filed Weights   01/02/18 1745 01/03/18 0500  Weight: 98.5 kg 97.6 kg     Labs   CBC: Recent Labs  Lab 01/02/18 1300 01/03/18 0427  WBC 12.6* 9.5  NEUTROABS 10.3*  --   HGB 14.1 12.8*  HCT 49.9 42.4  MCV 86.2 80.6  PLT 282 824   Basic Metabolic Panel: Recent Labs  Lab 01/02/18 1300 01/02/18 1938 01/03/18 0733  NA 138  --  138  K 5.3*  --  4.1  CL 105  --  104  CO2 26  --  25  GLUCOSE 170*  --  165*  BUN 18  --  21  CREATININE 1.65*  --  1.55*  CALCIUM 9.0  --  9.1  MG  --  1.5*  --   PHOS  --   --  2.6   GFR: Estimated Creatinine Clearance:  58.6 mL/min (A) (by C-G formula based on SCr of 1.55 mg/dL (H)). Recent Labs  Lab 01/02/18 1300 01/02/18 1313 01/02/18 1938 01/03/18 0427  PROCALCITON  --   --  0.35  --   WBC 12.6*  --   --  9.5  LATICACIDVEN  --  1.41  --   --    Liver Function Tests: Recent Labs  Lab 01/02/18 1300 01/03/18 0733  AST 19  --   ALT 12  --   ALKPHOS 70  --   BILITOT 0.6  --   PROT 7.2  --   ALBUMIN 3.7 3.1*   Recent Labs  Lab 01/02/18 1300  LIPASE 24   No results for input(s): AMMONIA in the last 168 hours. ABG    Component Value Date/Time   PHART 7.249 (L) 01/02/2018 1407   PCO2ART 52.3 (H) 01/02/2018 1407   PO2ART 149.0 (H) 01/02/2018 1407   HCO3 23.4 01/02/2018 1407   TCO2 25 01/02/2018 1407   ACIDBASEDEF 5.0 (H) 01/02/2018 1407   O2SAT 99.0 01/02/2018 1407    Coagulation Profile: No results for input(s): INR, PROTIME in the last 168 hours.   Cardiac Enzymes: Recent Labs  Lab 01/02/18 1938  TROPONINI 0.03*   HbA1C: Hemoglobin A1C  Date/Time Value Ref Range Status  10/06/2017 10:37 AM 7.4 (A) 4.0 - 5.6 % Final  07/25/2017 09:22 AM 7.1  Final   Hgb A1c MFr Bld  Date/Time Value Ref Range Status  10/17/2013 01:50 PM 9.3 (H) <5.7 % Final    Comment:    (NOTE)                                                                       According to the ADA Clinical Practice Recommendations for 2011, when HbA1c is used as a screening test:  >=6.5%   Diagnostic of Diabetes Mellitus           (if abnormal result is confirmed) 5.7-6.4%   Increased risk of developing Diabetes Mellitus References:Diagnosis and Classification of Diabetes Mellitus,Diabetes Care,2011,34(Suppl 1):S62-S69 and Standards of Medical Care in         Diabetes - 2011,Diabetes JJOA,4166,06 (Suppl 1):S11-S61.  05/10/2011 06:02 PM 9.9 (H) <5.7 % Final    Comment:    (NOTE)                                                                       According to the ADA Clinical Practice Recommendations for 2011,  when HbA1c is used as a screening test:  >=6.5%   Diagnostic of Diabetes Mellitus           (if abnormal result is confirmed) 5.7-6.4%   Increased risk of developing Diabetes Mellitus References:Diagnosis and Classification of Diabetes Mellitus,Diabetes TKZS,0109,32(TFTDD 1):S62-S69 and Standards of Medical Care in         Diabetes - 2011,Diabetes Care,2011,34 (Suppl 1):S11-S61.   CBG: Recent Labs  Lab 01/02/18 1258 01/02/18 2025 01/02/18 2319 01/03/18 0327 01/03/18 0817  GLUCAP 148* 165* 162* 139* 127*      LOS: 1 day    Noe Gens, NP-C [facility] Pgr: [phone] or if no answer [phone] 01/03/2018, 10:07 AM

## 2018-01-04 ENCOUNTER — Inpatient Hospital Stay (HOSPITAL_COMMUNITY): Payer: Medicare Other

## 2018-01-04 LAB — BASIC METABOLIC PANEL
Anion gap: 7 (ref 5–15)
BUN: 22 mg/dL (ref 8–23)
CO2: 26 mmol/L (ref 22–32)
Calcium: 8.9 mg/dL (ref 8.9–10.3)
Chloride: 106 mmol/L (ref 98–111)
Creatinine, Ser: 1.53 mg/dL — ABNORMAL HIGH (ref 0.61–1.24)
GFR calc Af Amer: 55 mL/min — ABNORMAL LOW (ref >60)
GFR calc non Af Amer: 47 mL/min — ABNORMAL LOW (ref >60)
Glucose, Bld: 225 mg/dL — ABNORMAL HIGH (ref 70–99)
Potassium: 3.9 mmol/L (ref 3.5–5.1)
Sodium: 139 mmol/L (ref 135–145)

## 2018-01-04 LAB — CBC
HCT: 37.7 % — ABNORMAL LOW (ref 39.0–52.0)
Hemoglobin: 11.3 g/dL — ABNORMAL LOW (ref 13.0–17.0)
MCH: 24.6 pg — ABNORMAL LOW (ref 26.0–34.0)
MCHC: 30 g/dL (ref 30.0–36.0)
MCV: 82 fL (ref 78.0–100.0)
Platelets: 201 10*3/uL (ref 150–400)
RBC: 4.6 MIL/uL (ref 4.22–5.81)
RDW: 16.6 % — ABNORMAL HIGH (ref 11.5–15.5)
WBC: 5.1 10*3/uL (ref 4.0–10.5)

## 2018-01-04 LAB — GLUCOSE, CAPILLARY
Glucose-Capillary: 181 mg/dL — ABNORMAL HIGH (ref 70–99)
Glucose-Capillary: 127 mg/dL — ABNORMAL HIGH (ref 70–99)
Glucose-Capillary: 150 mg/dL — ABNORMAL HIGH (ref 70–99)
Glucose-Capillary: 110 mg/dL — ABNORMAL HIGH (ref 70–99)
Glucose-Capillary: 45 mg/dL — ABNORMAL LOW (ref 70–99)
Glucose-Capillary: 46 mg/dL — ABNORMAL LOW (ref 70–99)
Glucose-Capillary: 94 mg/dL (ref 70–99)
Glucose-Capillary: 132 mg/dL — ABNORMAL HIGH (ref 70–99)

## 2018-01-04 MED ORDER — METHADONE HCL 10 MG PO TABS
85.0000 mg | ORAL_TABLET | Freq: Every day | ORAL | Status: DC
  Administered 2018-01-04: 85 mg via ORAL
  Filled 2018-01-04: qty 9

## 2018-01-04 MED ORDER — INSULIN GLARGINE 100 UNIT/ML ~~LOC~~ SOLN
10.0000 [IU] | Freq: Every day | SUBCUTANEOUS | Status: DC
  Administered 2018-01-04: 10 [IU] via SUBCUTANEOUS
  Filled 2018-01-04 (×2): qty 0.1

## 2018-01-04 MED ORDER — FAMOTIDINE 20 MG PO TABS
20.0000 mg | ORAL_TABLET | Freq: Two times a day (BID) | ORAL | Status: DC
  Administered 2018-01-04 (×2): 20 mg via ORAL
  Filled 2018-01-04 (×2): qty 1

## 2018-01-04 MED ORDER — INSULIN ASPART 100 UNIT/ML ~~LOC~~ SOLN
0.0000 [IU] | Freq: Three times a day (TID) | SUBCUTANEOUS | Status: DC
  Administered 2018-01-04: 1 [IU] via SUBCUTANEOUS

## 2018-01-04 MED ORDER — INSULIN ASPART 100 UNIT/ML ~~LOC~~ SOLN
3.0000 [IU] | Freq: Three times a day (TID) | SUBCUTANEOUS | Status: DC
  Administered 2018-01-04 (×2): 3 [IU] via SUBCUTANEOUS

## 2018-01-04 MED ORDER — INSULIN ASPART 100 UNIT/ML ~~LOC~~ SOLN: 0.0000 [IU] | Freq: Every day | SUBCUTANEOUS | Status: DC

## 2018-01-04 NOTE — Progress Notes (Signed)
Inpatient Diabetes Program Recommendations  AACE/ADA: New Consensus Statement on Inpatient Glycemic Control (2019)  Target Ranges:  Prepandial:   less than 140 mg/dL      Peak postprandial:   less than 180 mg/dL (1-2 hours)      Critically ill patients:  140 - 180 mg/dL   Results for [name], [name] (MRN [id number]) as of 01/04/2018 07:19  Ref. Range 01/03/2018 03:27 01/03/2018 08:17 01/03/2018 12:37 01/03/2018 16:30 01/03/2018 19:46 01/03/2018 22:59 01/04/2018 03:41  Glucose-Capillary Latest Ref Range: 70 - 99 mg/dL 139 (H) 127 (H) 141 (H) 179 (H) 201 (H) 270 (H) 181 (H)   Review of Glycemic Control  Diabetes history: DM2 Outpatient Diabetes medications: 70/30 35 units QAM, 70/30 25 units QPM Current orders for Inpatient glycemic control: Novolog 0-9 units Q4H  Inpatient Diabetes Program Recommendations:  Insulin - Basal: Please consider ordering Lantus 10 units Q24H. Correction (SSI): If patient is eating, please consider changing CBGs and Novolog correction to ACHS. Insulin - Meal Coverage: Please consider ordering Novolog 3 units TID with meals for meal coverage if patient eats at least 50% of meals.  Thanks, Barnie Alderman, RN, MSN, CDE Diabetes Coordinator Inpatient Diabetes Program [phone] (Team Pager from 8am to 5pm)

## 2018-01-04 NOTE — Care Management Note (Signed)
Case Management Note  Patient Details  Name: [name] MRN: [id number] Date of Birth: June 29, 1955  Subjective/Objective:                    Action/Plan:  Spoke w patient, discussed DC planning and PT recs. He would like HH PT and RW. He chose [facility]. Butch Penny notified and [facility] referral, and Jeneen Rinks to bring RW to room today.  Will Need HH PT order prior to DC.    Expected Discharge Date:                  Expected Discharge Plan:  [place]  In-House Referral:     Discharge planning Services  CM Consult  Post Acute Care Choice:  Home Health, Durable Medical Equipment Choice offered to:  Patient  DME Arranged:  [place] rolling DME Agency:  [place]:  PT [facility] Agency:  [place]  Status of Service:  In process, will continue to follow  If discussed at Long Length of Stay Meetings, dates discussed:    Additional Comments:  Carles Collet, RN 01/04/2018, 3:39 PM

## 2018-01-04 NOTE — Evaluation (Signed)
Physical Therapy Evaluation Patient Details Name: [name] MRN: [id number] DOB: 08-27-1955 Today's Date: 01/04/2018   History of Present Illness  62 year old male with PMH significant for kidney transplant (secondary to uncontrolled HTN/ DM), hypertension, atrial fibrillation (not on AC), diastolic heart failure, pulmonary hypertension, DMT2, sleep apnea, chronic lymphedema/ venous stasis, hepatitis C, and chronic pain on methadone.  Clinical Impression  PTA pt independent with ambulation of limited community distances with SPC, and receives help from a [facility] aide 2.5 hrs/ 7 days/week for bathing, and dressing. Pt currently limited in safe mobility by increased L LE pain and swelling, as well as deconditioning and decreased balance. Pt is supervision for transfers and min guard for ambulation with SPC, however experiences mild instability. Pt educated on the safety of using RW in future until he regains strength. PT recommends HHPT at d/c to improve strength and balance for safe mobility in his home environment. PT will continue to follow acutely.     Follow Up Recommendations Home health PT;Supervision for mobility/OOB    Equipment Recommendations  Rolling walker with 5" wheels    Recommendations for Other Services       Precautions / Restrictions Restrictions Weight Bearing Restrictions: No      Mobility  Bed Mobility               General bed mobility comments: sitting EoB on entry  Transfers Overall transfer level: Needs assistance Equipment used: Straight cane Transfers: Sit to/from Stand Sit to Stand: Supervision         General transfer comment: supervison for safety, c/o of dizziness initially, quickly dissipated,   Ambulation/Gait Ambulation/Gait assistance: Min guard Gait Distance (Feet): 200 Feet Assistive device: Straight cane;IV Pole Gait Pattern/deviations: Step-through pattern;Decreased stride length;Decreased weight shift to left;Decreased stance  time - left;Decreased step length - right Gait velocity: slowed Gait velocity interpretation: <1.8 ft/sec, indicate of risk for recurrent falls General Gait Details: min guard for safety, decreased L knee flexion with R lateral lean to advance L LE, initially requested to steady using IV pole on L and SPC in R hand, 1 x LoB which pt was able to self correct. last 30 feet pt utilized only SPC and exhibited increased instability, educated on need for use of RW until he becomes stronger         Balance Overall balance assessment: Needs assistance Sitting-balance support: Feet supported;No upper extremity supported Sitting balance-Leahy Scale: Good     Standing balance support: Single extremity supported;During functional activity Standing balance-Leahy Scale: Poor Standing balance comment: requires cane for steadying in standing                              Pertinent Vitals/Pain Pain Assessment: 0-10 Pain Score: 7  Pain Location: knee and calf  Pain Descriptors / Indicators: Throbbing Pain Intervention(s): Limited activity within patient's tolerance;Monitored during session;Repositioned    Home Living Family/patient expects to be discharged to:: Private residence Living Arrangements: Spouse/significant other Available Help at Discharge: Family;Available 24 hours/day Type of Home: House Home Access: Stairs to enter Entrance Stairs-Rails: None Entrance Stairs-Number of Steps: 4 Home Layout: One level Home Equipment: Grab bars - tub/shower;Cane - single point      Prior Function Level of Independence: Needs assistance      ADL's / Homemaking Assistance Needed: HHAide 2.5hrs/7 day/wk, assist with bathing and dressiong             Extremity/Trunk  Assessment   Upper Extremity Assessment Upper Extremity Assessment: Overall WFL for tasks assessed    Lower Extremity Assessment Lower Extremity Assessment: RLE deficits/detail;LLE deficits/detail RLE Deficits /  Details: R LE ROM WFL, Strength grossly assessed at 3+/5 RLE Sensation: decreased light touch RLE Coordination: decreased fine motor;decreased gross motor LLE Deficits / Details: 2x L TKA, L LE lymphedema, ROM limited by edema, strength grossly 2+/5 LLE Coordination: decreased fine motor       Communication   Communication: No difficulties  Cognition Arousal/Alertness: Awake/alert Behavior During Therapy: WFL for tasks assessed/performed;Flat affect Overall Cognitive Status: Within Functional Limits for tasks assessed                                        General Comments  Wife present during session. Vital signs stable throughout.         Assessment/Plan    PT Assessment Patient needs continued PT services  PT Problem List Decreased strength;Decreased range of motion;Decreased activity tolerance;Decreased balance;Decreased mobility;Decreased coordination;Decreased safety awareness       PT Treatment Interventions DME instruction;Gait training;Stair training;Functional mobility training;Therapeutic activities;Therapeutic exercise;Balance training;Cognitive remediation;Patient/family education    PT Goals (Current goals can be found in the Care Plan section)  Acute Rehab PT Goals Patient Stated Goal: go home  PT Goal Formulation: With patient/family Time For Goal Achievement: 01/18/18 Potential to Achieve Goals: Fair    Frequency Min 3X/week    AM-PAC PT "6 Clicks" Daily Activity  Outcome Measure Difficulty turning over in bed (including adjusting bedclothes, sheets and blankets)?: A Little Difficulty moving from lying on back to sitting on the side of the bed? : A Little Difficulty sitting down on and standing up from a chair with arms (e.g., wheelchair, bedside commode, etc,.)?: A Little Help needed moving to and from a bed to chair (including a wheelchair)?: A Little Help needed walking in hospital room?: A Little Help needed climbing 3-5 steps with  a railing? : A Lot 6 Click Score: 17    End of Session Equipment Utilized During Treatment: Gait belt Activity Tolerance: Patient tolerated treatment well Patient left: in bed;with call bell/phone within reach;with nursing/sitter in room Nurse Communication: Mobility status PT Visit Diagnosis: Unsteadiness on feet (R26.81);Other abnormalities of gait and mobility (R26.89);Muscle weakness (generalized) (M62.81);Difficulty in walking, not elsewhere classified (R26.2);Pain Pain - Right/Left: Left Pain - part of body: Leg    Time: 6045-4098 PT Time Calculation (min) (ACUTE ONLY): 20 min   Charges:   PT Evaluation $PT Eval Moderate Complexity: 1 Mod          Cj Beecher B. Migdalia Dk PT, DPT Acute Rehabilitation  [phone] Pager [phone]    [place] 01/04/2018, 2:51 PM

## 2018-01-04 NOTE — Progress Notes (Signed)
PROGRESS NOTE    [name]  BHA:[id number] DOB: 1955/05/24 DOA: 01/02/2018 PCP: Dorena Dew, FNP   Brief Narrative:  Per HPI 62 year old male with PMH significant for kidney transplant (secondary to uncontrolled HTN/ DM), hypertension, atrial fibrillation (not on AC), diastolic heart failure, pulmonary hypertension, DM2, sleep apnea, chronic lymphedema  venous stasis, hepatitis C, and chronic pain on methadone.  Girlfriend states that he was in his normal state of health 8/25 and they had a normal morning which consist of going every Monday to the methadone clinic 8/26. They came home, had their coffee, and then she said he got very sleepy and started foaming at the mouth. She states he is compliant with all his medications and denies any misuse.  Denies ETOH, illicit drug, and tobacco abuse.  EMS found patient with with decreased mental status, pinpoint pupils, and ineffective respirations requiring bagging.  He was given 2 doses of narcan and woke up and briefly became combative.  He denied any drug abuse.  He was also hypertensive requiring labetalol and was placed on Cardene gtt for which is now off. CT head non acute.  He was placed on narcan gtt for ongoing pinpoint pupils and decreased mental status.  CXR shows acute pulmonary edema, requiring ventri mask, and was given lasix 80mg .  PCCM called for admit.   He improved with narcan and observation.  Transferred to hospitalist service on 8/28.  Assessment & Plan:   Active Problems:   Acute encephalopathy  Acute Toxic Encephalopathy: sounds like this was due to unintentional overdose.  Pt partner notes she gave methadone earlier than she typically does (she gave in the AM when she usually gives it to him in the afternoon (~1 PM).  Denies any illicit substance. Utox negative Follow exam after restarting methadone  Acute Hypoxic Respiratory Failure 2/2 Acute Pulmonary Edema  Hypertensive Emergency Diastolic Heart Failure:   Initial ABG with respiratory acidosis likely 2/2 methadone.  Improved after narcan and diuresis.  Initial CXR with interstitial edema.  Improved today.  Last echo with normal EF, mild to moderate pulm HTN (09/2017 - see report) Appears euvolemic at this time, follow  Abx discontinued  Atrial Fibrillation: chadvasc 3.  Apparently his anticoagulation was d/c'd around time of his renal transplant.  He notes it was d/c'd by nephrology, though not clear why.  Per last d/c summary, it was recommended he f/u with cardiology regarding restarting anticoagulation.   Needs outpatient follow up  T2DM: lantus, mealtime, and SSI ordered.  Holding home 70/30 for now  Stage III CKD, hx of ESRD s/p renal transplant - continue tacrolimus, mycophenolate     Chronic Pain: restart methadone today at lower dose, ctm closely on continuous pulse ox.  Hepatitis C: outpatient follow up, of note, I don't see viral load in our system  Leukocytosis: likely reactive, abx have been d/c'd  DVT prophylaxis: lovenox Code Status: full  Family Communication: wife at bedside Disposition Plan: pending, likely tomorrow after observation on reduced dose of methadone to ensure pt will be safe on this dose at home.   Consultants:   PCCM transfer  Procedures:  None  Antimicrobials: Anti-infectives (From admission, onward)   Start     Dose/Rate Route Frequency Ordered Stop   01/02/18 1700  azithromycin (ZITHROMAX) 500 mg in sodium chloride 0.9 % 250 mL IVPB  Status:  Discontinued     500 mg 250 mL/hr over 60 Minutes Intravenous Every 24 hours 01/02/18 1659 01/04/18 9735  01/02/18 1700  cefTRIAXone (ROCEPHIN) 2 g in sodium chloride 0.9 % 100 mL IVPB  Status:  Discontinued     2 g 200 mL/hr over 30 Minutes Intravenous Every 24 hours 01/02/18 1659 01/04/18 0842     Subjective: Feeling back to baseline.  Objective: Vitals:   01/03/18 1726 01/03/18 2302 01/04/18 0500 01/04/18 0825  BP: (!) 147/91 135/84  (!) 141/90    Pulse: 86 71  79  Resp: 14 16  18   Temp: 98.1 F (36.7 C) 97.8 F (36.6 C)  97.7 F (36.5 C)  TempSrc: Oral Oral  Oral  SpO2: 95% 93%  95%  Weight: 97.7 kg  99.6 kg   Height: 5\' 11"  (1.803 m)       Intake/Output Summary (Last 24 hours) at 01/04/2018 0836 Last data filed at 01/03/2018 2000 Gross per 24 hour  Intake 1134.87 ml  Output 3050 ml  Net -1915.13 ml   Filed Weights   01/03/18 0500 01/03/18 1726 01/04/18 0500  Weight: 97.6 kg 97.7 kg 99.6 kg    Examination:  General exam: Appears calm and comfortable  Respiratory system: Clear to auscultation. Respiratory effort normal. Cardiovascular system: S1 & S2 heard, irregularly irregular. No JVD, murmurs, rubs, gallops or clicks. No pedal edema. Gastrointestinal system: Abdomen is nondistended, soft and nontender. No organomegaly or masses felt. Normal bowel sounds heard. Central nervous system: Alert and oriented. No focal neurological deficits. Extremities: L>R LEE (chronic), palpable DP pulses Skin: No rashes, lesions or ulcers Psychiatry: Judgement and insight appear normal. Mood & affect appropriate.     Data Reviewed: I have personally reviewed following labs and imaging studies  CBC: Recent Labs  Lab 01/02/18 1300 01/03/18 0427 01/04/18 0222  WBC 12.6* 9.5 5.1  NEUTROABS 10.3*  --   --   HGB 14.1 12.8* 11.3*  HCT 49.9 42.4 37.7*  MCV 86.2 80.6 82.0  PLT 282 226 676   Basic Metabolic Panel: Recent Labs  Lab 01/02/18 1300 01/02/18 1938 01/03/18 0733 01/04/18 0222  NA 138  --  138 139  K 5.3*  --  4.1 3.9  CL 105  --  104 106  CO2 26  --  25 26  GLUCOSE 170*  --  165* 225*  BUN 18  --  21 22  CREATININE 1.65*  --  1.55* 1.53*  CALCIUM 9.0  --  9.1 8.9  MG  --  1.5*  --   --   PHOS  --   --  2.6  --    GFR: Estimated Creatinine Clearance: 61 mL/min (A) (by C-G formula based on SCr of 1.53 mg/dL (H)). Liver Function Tests: Recent Labs  Lab 01/02/18 1300 01/03/18 0733  AST 19  --   ALT 12   --   ALKPHOS 70  --   BILITOT 0.6  --   PROT 7.2  --   ALBUMIN 3.7 3.1*   Recent Labs  Lab 01/02/18 1300  LIPASE 24   No results for input(s): AMMONIA in the last 168 hours. Coagulation Profile: No results for input(s): INR, PROTIME in the last 168 hours. Cardiac Enzymes: Recent Labs  Lab 01/02/18 1938  TROPONINI 0.03*   BNP (last 3 results) Recent Labs    11/16/17 1605  PROBNP 1,859*   HbA1C: No results for input(s): HGBA1C in the last 72 hours. CBG: Recent Labs  Lab 01/03/18 1630 01/03/18 1946 01/03/18 2259 01/04/18 0341 01/04/18 0754  GLUCAP 179* 201* 270* 181* 127*   Lipid Profile:  No results for input(s): CHOL, HDL, LDLCALC, TRIG, CHOLHDL, LDLDIRECT in the last 72 hours. Thyroid Function Tests: Recent Labs    01/02/18 1938  TSH 0.971   Anemia Panel: No results for input(s): VITAMINB12, FOLATE, FERRITIN, TIBC, IRON, RETICCTPCT in the last 72 hours. Sepsis Labs: Recent Labs  Lab 01/02/18 1313 01/02/18 1938  PROCALCITON  --  0.35  LATICACIDVEN 1.41  --     Recent Results (from the past 240 hour(s))  Urine culture     Status: None   Collection Time: 01/02/18  1:55 PM  Result Value Ref Range Status   Specimen Description URINE, CATHETERIZED  Final   Special Requests NONE  Final   Culture   Final    NO GROWTH Performed at [facility], [address] [place], [place] [address]    Report Status 01/03/2018 FINAL  Final  MRSA PCR Screening     Status: None   Collection Time: 01/02/18  6:04 PM  Result Value Ref Range Status   MRSA by PCR NEGATIVE NEGATIVE Final    Comment:        The GeneXpert MRSA Assay (FDA approved for NASAL specimens only), is one component of a comprehensive MRSA colonization surveillance program. It is not intended to diagnose MRSA infection nor to guide or monitor treatment for MRSA infections. Performed at [facility], [place] [address] [place], [place] [address]   Culture, blood (routine x 2)      Status: None (Preliminary result)   Collection Time: 01/02/18  7:00 PM  Result Value Ref Range Status   Specimen Description BLOOD RIGHT ANTECUBITAL  Final   Special Requests   Final    BOTTLES DRAWN AEROBIC ONLY Blood Culture results may not be optimal due to an inadequate volume of blood received in culture bottles   Culture   Final    NO GROWTH 2 DAYS Performed at [place] [address] [place], [place] [address]    Report Status PENDING  Incomplete  Culture, blood (routine x 2)     Status: None (Preliminary result)   Collection Time: 01/02/18  7:38 PM  Result Value Ref Range Status   Specimen Description BLOOD RIGHT ARM  Final   Special Requests   Final    BOTTLES DRAWN AEROBIC ONLY Blood Culture results may not be optimal due to an inadequate volume of blood received in culture bottles   Culture   Final    NO GROWTH 2 DAYS Performed at [facility], [place] [address] [place], [place] [address]    Report Status PENDING  Incomplete         Radiology Studies: Ct Head Wo Contrast  Result Date: 01/02/2018 CLINICAL DATA:  Altered mental status, pinpoint pupils, combative EXAM: CT HEAD WITHOUT CONTRAST TECHNIQUE: Contiguous axial images were obtained from the base of the skull through the vertex without intravenous contrast. COMPARISON:  04/06/2014 FINDINGS: Brain: Patchy areas of hypoattenuation in deep white matter right greater than left, stable. Negative for acute intracranial hemorrhage, mass lesion, acute infarction, midline shift, or mass-effect. Acute infarct may be inapparent on noncontrast CT. Ventricles and sulci symmetric. Vascular: Atherosclerotic and physiologic intracranial calcifications. Skull: Normal. Negative for fracture or focal lesion. Sinuses/Orbits: Stable retention cyst or polyp in the left maxillary sinus. Chronic opacification left frontal sinus and left ethmoid air cells. Other: None IMPRESSION: 1. Chronic nonspecific areas of white matter  hypoattenuation. No acute findings. 2. Chronic paranasal sinus disease as  above. Electronically Signed   By: Lucrezia Europe M.D.   On: 01/02/2018 15:28   Dg Chest Port 1 View  Result Date: 01/03/2018 CLINICAL DATA:  Pulmonary edema by history, follow-up EXAM: PORTABLE CHEST 1 VIEW COMPARISON:  Portable chest x-ray of 01/02/2017 FINDINGS: There is little change in moderate cardiomegaly and very mild pulmonary vascular congestion, possibly improved somewhat. No pleural effusion is seen. Mediastinal and hilar contours are unremarkable. No bony abnormality is seen. IMPRESSION: Little change to slight improvement in mild pulmonary vascular congestion. Stable cardiomegaly. Electronically Signed   By: Ivar Drape M.D.   On: 01/03/2018 09:07   Dg Chest Portable 1 View  Result Date: 01/02/2018 CLINICAL DATA:  Altered mental status EXAM: PORTABLE CHEST 1 VIEW COMPARISON:  10/25/2017 FINDINGS: Cardiac enlargement with vascular congestion and mild interstitial edema similar to the prior study. Negative for pleural effusion. No focal consolidation IMPRESSION: Cardiac enlargement with vascular congestion and diffuse interstitial edema similar to prior studies compatible with chronic congestive heart failure. Electronically Signed   By: Franchot Gallo M.D.   On: 01/02/2018 13:22        Scheduled Meds: . aspirin EC  81 mg Oral Daily  . carvedilol  25 mg Oral BID WC  . cloNIDine  0.3 mg Oral TID  . heparin  5,000 Units Subcutaneous Q8H  . insulin aspart  0-5 Units Subcutaneous QHS  . insulin aspart  0-9 Units Subcutaneous TID WC  . insulin aspart  3 Units Subcutaneous TID WC  . insulin glargine  10 Units Subcutaneous Daily  . iron polysaccharides  150 mg Oral Daily  . magnesium oxide  400 mg Oral Daily  . mouth rinse  15 mL Mouth Rinse BID  . mycophenolate  540 mg Oral BID  . NIFEdipine  60 mg Oral Daily  . pantoprazole  40 mg Oral Q1200  . pravastatin  40 mg Oral q1800  . tacrolimus  1 mg Oral QHS  .  tacrolimus  2 mg Oral Daily   Continuous Infusions: . sodium chloride 250 mL (01/03/18 1635)  . azithromycin 250 mL/hr at 01/03/18 1700  . cefTRIAXone (ROCEPHIN)  IV 200 mL/hr at 01/03/18 1700  . famotidine (PEPCID) IV 20 mg (01/03/18 1742)  . naLOXone [facility]) adult infusion for OVERDOSE Stopped (01/03/18 1030)     LOS: 2 days    Time spent: over 30 min MDM mod with multiple medical issues    Fayrene Helper, MD [facility] Pager [phone]  If 7PM-7AM, please contact night-coverage [email or web address] Password [facility] 01/04/2018, 8:36 AM

## 2018-01-05 LAB — MAGNESIUM: Magnesium: 1.7 mg/dL (ref 1.7–2.4)

## 2018-01-05 LAB — BASIC METABOLIC PANEL
Anion gap: 9 (ref 5–15)
BUN: 21 mg/dL (ref 8–23)
CO2: 24 mmol/L (ref 22–32)
Calcium: 9.1 mg/dL (ref 8.9–10.3)
Chloride: 105 mmol/L (ref 98–111)
Creatinine, Ser: 1.51 mg/dL — ABNORMAL HIGH (ref 0.61–1.24)
GFR calc Af Amer: 56 mL/min — ABNORMAL LOW (ref >60)
GFR calc non Af Amer: 48 mL/min — ABNORMAL LOW (ref >60)
Glucose, Bld: 151 mg/dL — ABNORMAL HIGH (ref 70–99)
Potassium: 4.3 mmol/L (ref 3.5–5.1)
Sodium: 138 mmol/L (ref 135–145)

## 2018-01-05 LAB — CBC
HCT: 38.7 % — ABNORMAL LOW (ref 39.0–52.0)
Hemoglobin: 11.5 g/dL — ABNORMAL LOW (ref 13.0–17.0)
MCH: 24.3 pg — ABNORMAL LOW (ref 26.0–34.0)
MCHC: 29.7 g/dL — ABNORMAL LOW (ref 30.0–36.0)
MCV: 81.8 fL (ref 78.0–100.0)
Platelets: 216 10*3/uL (ref 150–400)
RBC: 4.73 MIL/uL (ref 4.22–5.81)
RDW: 16.2 % — ABNORMAL HIGH (ref 11.5–15.5)
WBC: 5 10*3/uL (ref 4.0–10.5)

## 2018-01-05 LAB — GLUCOSE, CAPILLARY
Glucose-Capillary: 135 mg/dL — ABNORMAL HIGH (ref 70–99)
Glucose-Capillary: 159 mg/dL — ABNORMAL HIGH (ref 70–99)

## 2018-01-05 MED ORDER — INSULIN ASPART PROT & ASPART (70-30 MIX) 100 UNIT/ML ~~LOC~~ SUSP: SUBCUTANEOUS | 11 refills | Status: DC

## 2018-01-05 MED ORDER — METHADONE HCL 10 MG/5ML PO SOLN: 85.0000 mg | Freq: Every day | ORAL | 0 refills | Status: DC

## 2018-01-05 NOTE — Progress Notes (Signed)
Physical Therapy Treatment & Discharge Patient Details Name: [name] MRN: [id number] DOB: 03-07-1956 Today's Date: 01/05/2018    History of Present Illness Pt is a 62 y.o. male admitted 01/02/18 with acute toxic encephalopathy, potentially due to unintentional overdose. PMH includes kidney transplant (secondary to uncontrolled DM/HTN), HTN, HF, pulmonary HTN, DM, sleep apnea, chronic lymphadema/venous stais, Hep C, chronic pain.   PT Comments    Pt progressing with mobility. Continues to demonstrate instability ambulating without DME. Mod indep with RW; reports bilat knee pain lessened with BUE support on walker. Declined stair training. Has met short-term acute PT goals. Has no further questions or concerns. Continue to recommend HHPT services. Will d/c acute PT.   Follow Up Recommendations  Home health PT;Supervision for mobility/OOB     Equipment Recommendations  Rolling walker with 5" wheels(already delivered)    Recommendations for Other Services       Precautions / Restrictions Precautions Precautions: None Restrictions Weight Bearing Restrictions: No    Mobility  Bed Mobility Overal bed mobility: Independent             General bed mobility comments: Sitting EOB with RN   Transfers Overall transfer level: Independent                  Ambulation/Gait Ambulation/Gait assistance: Modified independent (Device/Increase time) Gait Distance (Feet): 300 Feet Assistive device: Rolling walker (2 wheeled) Gait Pattern/deviations: Step-through pattern;Decreased stride length Gait velocity: Decreased Gait velocity interpretation: 1.31 - 2.62 ft/sec, indicative of limited community ambulator General Gait Details: Slow, steady amb mod indep with RW   Stairs Stairs: (Pt declined)           Wheelchair Mobility    Modified Rankin (Stroke Patients Only)       Balance Overall balance assessment: Needs assistance Sitting-balance support: Feet  supported;No upper extremity supported Sitting balance-Leahy Scale: Good     Standing balance support: Single extremity supported;During functional activity Standing balance-Leahy Scale: Fair Standing balance comment: Can static stand with no UE support; dynamic stability improved with BUE support                            Cognition Arousal/Alertness: Awake/alert Behavior During Therapy: WFL for tasks assessed/performed;Flat affect Overall Cognitive Status: Within Functional Limits for tasks assessed                                        Exercises      General Comments        Pertinent Vitals/Pain Pain Assessment: Faces Faces Pain Scale: Hurts little more Pain Location: bilat knees Pain Descriptors / Indicators: Sore Pain Intervention(s): Monitored during session    Home Living                      Prior Function            PT Goals (current goals can now be found in the care plan section) Progress towards PT goals: Goals met/education completed, patient discharged from PT    Frequency    Min 3X/week      PT Plan Current plan remains appropriate    Co-evaluation              AM-PAC PT "6 Clicks" Daily Activity  Outcome Measure  Difficulty turning over in bed (including adjusting bedclothes, sheets and  blankets)?: None Difficulty moving from lying on back to sitting on the side of the bed? : None Difficulty sitting down on and standing up from a chair with arms (e.g., wheelchair, bedside commode, etc,.)?: None Help needed moving to and from a bed to chair (including a wheelchair)?: None Help needed walking in hospital room?: None Help needed climbing 3-5 steps with a railing? : A Little 6 Click Score: 23    End of Session Equipment Utilized During Treatment: Gait belt Activity Tolerance: Patient tolerated treatment well Patient left: in bed;with call bell/phone within reach;with family/visitor present;with  nursing/sitter in room Nurse Communication: Mobility status PT Visit Diagnosis: Unsteadiness on feet (R26.81);Other abnormalities of gait and mobility (R26.89);Muscle weakness (generalized) (M62.81);Difficulty in walking, not elsewhere classified (R26.2);Pain Pain - part of body: Leg     Time: 5909-3112 PT Time Calculation (min) (ACUTE ONLY): 13 min  Charges:  $Gait Training: 8-22 mins                    Mabeline Caras, PT, DPT Acute Rehabilitation Services  Pager [phone] Office [place] 01/05/2018, 9:18 AM

## 2018-01-05 NOTE — Care Management Note (Signed)
Case Management Note  Patient Details  Name: [name] MRN: [id number] Date of Birth: Jul 24, 1955  Subjective/Objective:   Patient for dc home today with HHRN/HHPT and rolling walker.  [name] is in patient's room , Butch Penny with [facility] notified of patient being dc today and to add HHRN to services.  Soc will begin 24-48 hrs post dc.                   Action/Plan: DC home when ready.  Expected Discharge Date:  01/05/18               Expected Discharge Plan:  [place]  In-House Referral:     Discharge planning Services  CM Consult  Post Acute Care Choice:  Home Health, Durable Medical Equipment Choice offered to:  Patient  DME Arranged:  Walker rolling DME Agency:  [place] Arranged:  PT, RN, Disease Management [place] Agency:  [place]  Status of Service:  Completed, signed off  If discussed at [place] of Stay Meetings, dates discussed:    Additional Comments:  Zenon Mayo, RN 01/05/2018, 8:46 AM

## 2018-01-05 NOTE — Progress Notes (Signed)
Inpatient Diabetes Program Recommendations  AACE/ADA: New Consensus Statement on Inpatient Glycemic Control (2019)  Target Ranges:  Prepandial:   less than 140 mg/dL      Peak postprandial:   less than 180 mg/dL (1-2 hours)      Critically ill patients:  140 - 180 mg/dL  Results for [name], [name] (MRN [id number]) as of 01/05/2018 07:26  Ref. Range 01/04/2018 07:54 01/04/2018 11:35 01/04/2018 16:31 01/04/2018 20:27 01/04/2018 20:29 01/04/2018 21:31 01/04/2018 23:13 01/05/2018 03:36  Glucose-Capillary Latest Ref Range: 70 - 99 mg/dL 127 (H)   Lantus 10 units @9 :39 150 (H)  Novolog 4 units @11 :37 110 (H)  Novolog 3 units @17 :30 46 (L) 45 (L) 94 132 (H) 135 (H)    Review of Glycemic Control Diabetes history: DM2 Outpatient Diabetes medications: 70/30 35 units QAM, 70/30 25 units QPM Current orders for Inpatient glycemic control: Novolog 0-9 units TID with meals, Novolog 0-5 units QHS, Lantus 10 units daily  Inpatient Diabetes Program Recommendations:  Insulin-Basal: Do NOT recommend changing Lantus dose at this time.  Insulin - Meal Coverage: Noted patient received Novolog 3 units for meal coverage for supper on 8/28 but no meal intake documented and following CBG 46 mg/dl at 20:27. Question if patient received meal coverage and did not eat.   RNs, please be sure patient eats at least 50% of meals before giving meal coverage insulin.  Thanks, Barnie Alderman, RN, MSN, CDE Diabetes Coordinator Inpatient Diabetes Program [phone] (Team Pager from 8am to 5pm)

## 2018-01-05 NOTE — Discharge Summary (Addendum)
Physician Discharge Summary  [name] WIO:[id number] DOB: Sep 16, 1955 DOA: 01/02/2018  PCP: Randy Dew, FNP  Admit date: 01/02/2018 Discharge date: 01/05/2018  Time spent: 40 minutes  Recommendations for Outpatient Follow-up:  1. Follow up outpatient CBC/CMP 2. Follow up with [facility] regarding [facility]one dosing 3. Follow up regarding need for anticoagulation with atrial fibrillation  4. Follow up with renal and cardiology as outpatient 5. Follow up insulin dosing (lower dose provided here on discharge, continue to monitor) 6.    Discharge Diagnoses:  Active Problems:   Acute encephalopathy   Discharge Condition: stable  Diet recommendation: heart healthy  Filed Weights   01/03/18 1726 01/04/18 0500 01/05/18 0500  Weight: 97.7 kg 99.6 kg 97.5 kg    History of present illness:  Per HPI 63 year old male with PMH significant for kidney transplant (secondary to uncontrolled HTN/ DM), hypertension, atrial fibrillation (not on AC), diastolic heart failure, pulmonary hypertension, DM2, sleep apnea, chronic lymphedema venous stasis, hepatitis C, and chronic pain on methadone.  Girlfriend states that he was in his normal state of health8/25and they had [name] normal morning which consist of going every Monday to the methadone clinic 8/26. They came home, had their coffee, and then she said he got very sleepy and started foaming at the mouth. She states he is compliant with all his medications and denies any misuse. Denies ETOH, illicit drug, and tobacco abuse.  EMS found patient with with decreased mental status, pinpoint pupils, and ineffective respirations requiring bagging. He was given 2 doses of narcan and woke up and briefly became combative. He denied any drug abuse. He was also hypertensive requiring labetalol and was placed on Cardene gtt for which is now off. CT head non acute. He was placed on narcan gtt for ongoing pinpoint pupils and decreased mental status.  CXR shows acute pulmonary edema, requiring ventri mask, and was given lasix 80mg . PCCM called for admit.   He improved with narcan and observation.  Transferred to hospitalist service on 8/28.  He was observed with lower dose of methadone and did well with this dose.  He was discharged on 8/29.  Hospital Course:  Acute Toxic Encephalopathy: sounds like this was due to unintentional overdose.  Pt partner notes she gave methadone earlier than she typically does (she gave in the AM when she usually gives it to him in the afternoon (~1 PM).  Denies any illicit substance.  Denies depression or SI. Utox negative Pt did well on reduced dose of methadone yesterday.  Acute Hypoxic Respiratory Failure 2/2 Acute Pulmonary Edema  Hypertensive Emergency Diastolic Heart Failure:  Initial ABG with respiratory acidosis likely 2/2 methadone.  Improved after narcan and diuresis.  Initial CXR with interstitial edema.  Improved.  Last echo with normal EF, mild to moderate pulm HTN (09/2017 - see report) Appears euvolemic at this time, follow  Abx discontinued  Atrial Fibrillation: chadvasc 3.  Apparently his anticoagulation was d/c'd around time of his renal transplant.  He notes it was d/c'd by nephrology, though not clear why.  Per last d/c summary, it was recommended he f/u with cardiology regarding restarting anticoagulation.   Needs outpatient follow up regarding this and potentially restarting anticoagulation.    T2DM: lantus, mealtime, and SSI ordered.  Discharged on decreased dose of 70/30 as he'd only been getting low dose of lantus with mealtime insulin here.  Stage III CKD, hx of ESRD s/p renal transplant - continue tacrolimus, mycophenolate     Chronic Pain: Restart  methadone at 85 mg daily.  Pt did well with this yesterday.  Discussed importance of continuing this dose and f/u with [facility] regarding appropriate dosing going forward (I was unable to reach [facility] over the phone.  attempted to call on 8/28 and 8/29, but repeatedly received busy tone).  Hepatitis C: outpatient follow up, of note, I don't see viral load in our system, but he notes this should be negative.  Would follow up outpatient with PCP.  Leukocytosis: likely reactive, abx have been d/c'd  Procedures:  none   Consultations:  PCCM transfer  Discharge Exam: Vitals:   01/04/18 2022 01/05/18 0747  BP: (!) 145/87 (!) 151/72  Pulse: 79 61  Resp: 20 20  Temp: 98 F (36.7 C) 97.8 F (36.6 C)  SpO2: 95% 96%   [name]&Ox3. Denies depression or SI. No complaints. Wife at bedside, notes pt did well yesterday on reduced dose of methadone.  General: No acute distress. Cardiovascular: Heart sounds show [name] regular rate, and rhythm.  Lungs: Clear to auscultation bilaterally with good air movement Abdomen: Soft, nontender, nondistended  Neurological: Alert and oriented 3. Moves all extremities 4. Cranial nerves II through XII grossly intact. Skin: Warm and dry. No rashes or lesions. Extremities: No clubbing or cyanosis. No edema.  Psychiatric: Mood and affect are normal. Insight and judgment are appropriate.  Discharge Instructions   Discharge Instructions    Call MD for:  difficulty breathing, headache or visual disturbances   Complete by:  As directed    Call MD for:  extreme fatigue   Complete by:  As directed    Call MD for:  hives   Complete by:  As directed    Call MD for:  persistant dizziness or light-headedness   Complete by:  As directed    Call MD for:  persistant nausea and vomiting   Complete by:  As directed    Call MD for:  redness, tenderness, or signs of infection (pain, swelling, redness, odor or green/yellow discharge around incision site)   Complete by:  As directed    Call MD for:  severe uncontrolled pain   Complete by:  As directed    Call MD for:  temperature >100.4   Complete by:  As directed    Diet - low sodium heart healthy   Complete by:  As directed     Discharge instructions   Complete by:  As directed    You were seen for [name] methadone overdose.  This may have been because of the timing of the administration of the methadone, but it's [name] bit unclear.    We reduced your methadone dose to 85 mg.  Please continue this dose and follow up with [facility] for further adjustments to your methadone dosing.  We decreased your insulin to 8 units of 70/30 twice daily.  Continue to check your blood sugar at least 3 times daily with meals and before bedtime.  Please bring Hazel Wrinkle list of these blood sugar measurements to your next appointment with your PCP so they can adjust your regimen.   You have atrial fibrillation and have previously been on anticoagulation, but it sounds like this was discontinued by your nephrologist.  Please follow up with your nephrologist to see if this can be restarted.  Return for new, recurrent, or worsening symptoms.  Please ask your PCP to request records from this hospitalization so they know what was done and what the next steps will be.   Increase activity slowly  Complete by:  As directed      Allergies as of 01/05/2018      Reactions   Tramadol Hives, Itching, Rash      Medication List    TAKE these medications   albuterol 108 (90 Base) MCG/ACT inhaler Commonly known as:  PROVENTIL HFA;VENTOLIN HFA Inhale 2 puffs into the lungs every 6 (six) hours as needed for wheezing or shortness of breath.   aspirin EC 81 MG tablet Take 81 mg by mouth daily.   carvedilol 25 MG tablet Commonly known as:  COREG Take 25 mg by mouth 2 (two) times daily with Yanely Mast meal.   cloNIDine 0.3 MG tablet Commonly known as:  CATAPRES Take 0.15 mg by mouth 2 (two) times daily.   furosemide 20 MG tablet Commonly known as:  LASIX Take 1 tablet (20 mg total) by mouth 2 (two) times daily.   insulin aspart protamine- aspart (70-30) 100 UNIT/ML injection Commonly known as:  NOVOLOG MIX 70/30 Injects 8 units every morning and injects 8 units  every evening (follow up with your PCP for adjustments) What changed:  additional instructions   magnesium oxide 400 MG tablet Commonly known as:  MAG-OX Take 400 mg by mouth daily.   methadone 10 MG/5ML solution Commonly known as:  DOLOPHINE Take 42.5 mLs (85 mg total) by mouth daily. [facility] (please [facility] for adjustments to your dose) What changed:    how much to take  additional instructions   mycophenolate 180 MG EC tablet Commonly known as:  MYFORTIC Take 540 mg by mouth 2 (two) times daily.   NIFEdipine 60 MG 24 hr tablet Commonly known as:  PROCARDIA XL/ADALAT-CC Take 60 mg by mouth daily.   pantoprazole 40 MG tablet Commonly known as:  PROTONIX Take 1 tablet (40 mg total) by mouth daily at 12 noon.   POLY-IRON 150 150 MG capsule Generic drug:  iron polysaccharides Take 150 mg by mouth daily.   pravastatin 40 MG tablet Commonly known as:  PRAVACHOL Take 40 mg by mouth daily.   pregabalin 100 MG capsule Commonly known as:  LYRICA Take 100 mg by mouth 3 (three) times daily.   Spacer/Aero Chamber Mouthpiece Misc 1 each by Does not apply route every 6 (six) hours as needed.   tacrolimus 1 MG capsule Commonly known as:  PROGRAF Take 1-2 mg by mouth See admin instructions. Take 2 mg by mouth in the morning and 1 mg in the evening      Allergies  Allergen Reactions  . Tramadol Hives, Itching and Rash   Follow-up Information    [facility] Follow [facility].   Specialty:  [place] Why:  they will call you to set up your first home appointment in 1-2 days , [facility], HHPT Contact information: [place] [address] [place], [place], FNP Follow up.   Specialty:  Family Medicine Why:  Please call for Nazareth Kirk follow up appointment Contact information: [place] [address] [phone]        Buford Dresser, MD .    Specialty:  Cardiology Contact information: [address] [employer] [address] [place] [address] [phone]        Corliss Parish, MD Follow up.   Specialty:  Nephrology Why:  Please call for Bobie Kistler follow up appointment Contact information: [place] [address] [phone]  [place] Follow up.   Why:  Best boy information: [address] [place] [address] [phone]            The results of significant diagnostics from this hospitalization (including imaging, microbiology, ancillary and laboratory) are listed below for reference.    Significant Diagnostic Studies: Dg Chest 2 View  Result Date: 01/04/2018 CLINICAL DATA:  Pulmonary edema EXAM: CHEST - 2 VIEW COMPARISON:  01/03/2018 FINDINGS: Cardiac shadow is enlarged with stable aortic calcifications. Lungs are well aerated bilaterally. Previously seen pulmonary vascular congestion and edema has nearly completely resolved. No focal infiltrate or effusion is seen. No bony abnormality is noted. IMPRESSION: Near complete resolution vascular congestion and edema. Electronically Signed   By: Inez Catalina M.D.   On: 01/04/2018 12:36   Ct Head Wo Contrast  Result Date: 01/02/2018 CLINICAL DATA:  Altered mental status, pinpoint pupils, combative EXAM: CT HEAD WITHOUT CONTRAST TECHNIQUE: Contiguous axial images were obtained from the base of the skull through the vertex without intravenous contrast. COMPARISON:  04/06/2014 FINDINGS: Brain: Patchy areas of hypoattenuation in deep white matter right greater than left, stable. Negative for acute intracranial hemorrhage, mass lesion, acute infarction, midline shift, or mass-effect. Acute infarct may be inapparent on noncontrast CT. Ventricles and sulci symmetric. Vascular: Atherosclerotic and physiologic intracranial calcifications. Skull: Normal. Negative for fracture or focal lesion. Sinuses/Orbits: Stable retention cyst  or polyp in the left maxillary sinus. Chronic opacification left frontal sinus and left ethmoid air cells. Other: None IMPRESSION: 1. Chronic nonspecific areas of white matter hypoattenuation. No acute findings. 2. Chronic paranasal sinus disease as above. Electronically Signed   By: Lucrezia Europe M.D.   On: 01/02/2018 15:28   Dg Chest Port 1 View  Result Date: 01/03/2018 CLINICAL DATA:  Pulmonary edema by history, follow-up EXAM: PORTABLE CHEST 1 VIEW COMPARISON:  Portable chest x-ray of 01/02/2017 FINDINGS: There is little change in moderate cardiomegaly and very mild pulmonary vascular congestion, possibly improved somewhat. No pleural effusion is seen. Mediastinal and hilar contours are unremarkable. No bony abnormality is seen. IMPRESSION: Little change to slight improvement in mild pulmonary vascular congestion. Stable cardiomegaly. Electronically Signed   By: Ivar Drape M.D.   On: 01/03/2018 09:07   Dg Chest Portable 1 View  Result Date: 01/02/2018 CLINICAL DATA:  Altered mental status EXAM: PORTABLE CHEST 1 VIEW COMPARISON:  10/25/2017 FINDINGS: Cardiac enlargement with vascular congestion and mild interstitial edema similar to the prior study. Negative for pleural effusion. No focal consolidation IMPRESSION: Cardiac enlargement with vascular congestion and diffuse interstitial edema similar to prior studies compatible with chronic congestive heart failure. Electronically Signed   By: Franchot Gallo M.D.   On: 01/02/2018 13:22    Microbiology: Recent Results (from the past 240 hour(s))  Urine culture     Status: None   Collection Time: 01/02/18  1:55 PM  Result Value Ref Range Status   Specimen Description URINE, CATHETERIZED  Final   Special Requests NONE  Final   Culture   Final    NO GROWTH Performed at [facility], [address] [place], [place] [address]    Report Status 01/03/2018 FINAL  Final  MRSA PCR Screening     Status: None   Collection Time: 01/02/18  6:04 PM   Result Value Ref Range Status   MRSA by PCR NEGATIVE NEGATIVE Final    Comment:        The GeneXpert MRSA Assay (FDA approved  for NASAL specimens only), is one component of Lakela Kuba comprehensive MRSA colonization surveillance program. It is not intended to diagnose MRSA infection nor to guide or monitor treatment for MRSA infections. Performed at [facility], [place] [address], [place], [place] [address]   Culture, blood (routine x 2)     Status: None (Preliminary result)   Collection Time: 01/02/18  7:00 PM  Result Value Ref Range Status   Specimen Description BLOOD RIGHT ANTECUBITAL  Final   Special Requests   Final    BOTTLES DRAWN AEROBIC ONLY Blood Culture results may not be optimal due to an inadequate volume of blood received in culture bottles   Culture   Final    NO GROWTH 3 DAYS Performed at [facility], [place] [address], [place], [place] [address]    Report Status PENDING  Incomplete  Culture, blood (routine x 2)     Status: None (Preliminary result)   Collection Time: 01/02/18  7:38 PM  Result Value Ref Range Status   Specimen Description BLOOD RIGHT ARM  Final   Special Requests   Final    BOTTLES DRAWN AEROBIC ONLY Blood Culture results may not be optimal due to an inadequate volume of blood received in culture bottles   Culture   Final    NO GROWTH 3 DAYS Performed at [facility], [place] [address], [place], [place] [address]    Report Status PENDING  Incomplete     Labs: Basic Metabolic Panel: Recent Labs  Lab 01/02/18 1300 01/02/18 1938 01/03/18 0733 01/04/18 0222 01/05/18 0353  NA 138  --  138 139 138  K 5.3*  --  4.1 3.9 4.3  CL 105  --  104 106 105  CO2 26  --  25 26 24   GLUCOSE 170*  --  165* 225* 151*  BUN 18  --  21 22 21   CREATININE 1.65*  --  1.55* 1.53* 1.51*  CALCIUM 9.0  --  9.1 8.9 9.1  MG  --  1.5*  --   --  1.7  PHOS  --   --  2.6  --   --    Liver Function Tests: Recent Labs  Lab 01/02/18 1300 01/03/18 0733  AST  19  --   ALT 12  --   ALKPHOS 70  --   BILITOT 0.6  --   PROT 7.2  --   ALBUMIN 3.7 3.1*   Recent Labs  Lab 01/02/18 1300  LIPASE 24   No results for input(s): AMMONIA in the last 168 hours. CBC: Recent Labs  Lab 01/02/18 1300 01/03/18 0427 01/04/18 0222 01/05/18 0353  WBC 12.6* 9.5 5.1 5.0  NEUTROABS 10.3*  --   --   --   HGB 14.1 12.8* 11.3* 11.5*  HCT 49.9 42.4 37.7* 38.7*  MCV 86.2 80.6 82.0 81.8  PLT 282 226 201 216   Cardiac Enzymes: Recent Labs  Lab 01/02/18 1938  TROPONINI 0.03*   BNP: BNP (last 3 results) Recent Labs    09/23/17 1832 01/02/18 1938  BNP 374.4* 420.3*    ProBNP (last 3 results) Recent Labs    11/16/17 1605  PROBNP 1,859*    CBG: Recent Labs  Lab 01/04/18 2029 01/04/18 2131 01/04/18 2313 01/05/18 0336 01/05/18 0738  GLUCAP 45* 94 132* 135* 159*       Signed:  Fayrene Helper MD.  [facility] 01/05/2018, 5:59 PM

## 2018-01-07 LAB — CULTURE, BLOOD (ROUTINE X 2)
Culture: NO GROWTH
Culture: NO GROWTH

## 2018-01-10 ENCOUNTER — Ambulatory Visit: Payer: Medicare Other | Admitting: Cardiology

## 2018-01-11 ENCOUNTER — Telehealth: Payer: Self-pay | Admitting: Pulmonary Disease

## 2018-01-11 NOTE — Telephone Encounter (Signed)
ATC home number on file and receive message that call could not be completed as dial, and the mobile number is not accepting calls at this time.  EC contact numbers are the same as pt's.

## 2018-01-12 NOTE — Telephone Encounter (Signed)
Attempted to call pt on all the numbers we have on file for him. None of the numbers are currently working. His emergency contact has the same numbers as he does. Will try back later.

## 2018-01-13 DIAGNOSIS — Z940 Kidney transplant status: Secondary | ICD-10-CM | POA: Diagnosis not present

## 2018-01-13 DIAGNOSIS — E785 Hyperlipidemia, unspecified: Secondary | ICD-10-CM | POA: Diagnosis not present

## 2018-01-13 DIAGNOSIS — I272 Pulmonary hypertension, unspecified: Secondary | ICD-10-CM | POA: Diagnosis not present

## 2018-01-13 DIAGNOSIS — Z0000 Encounter for general adult medical examination without abnormal findings: Secondary | ICD-10-CM | POA: Diagnosis not present

## 2018-01-13 DIAGNOSIS — Z79899 Other long term (current) drug therapy: Secondary | ICD-10-CM | POA: Diagnosis not present

## 2018-01-13 DIAGNOSIS — N2889 Other specified disorders of kidney and ureter: Secondary | ICD-10-CM | POA: Diagnosis not present

## 2018-01-13 DIAGNOSIS — I4892 Unspecified atrial flutter: Secondary | ICD-10-CM | POA: Diagnosis not present

## 2018-01-13 DIAGNOSIS — N529 Male erectile dysfunction, unspecified: Secondary | ICD-10-CM | POA: Diagnosis not present

## 2018-01-13 DIAGNOSIS — I151 Hypertension secondary to other renal disorders: Secondary | ICD-10-CM | POA: Diagnosis not present

## 2018-01-13 DIAGNOSIS — E669 Obesity, unspecified: Secondary | ICD-10-CM | POA: Diagnosis not present

## 2018-01-13 DIAGNOSIS — E1122 Type 2 diabetes mellitus with diabetic chronic kidney disease: Secondary | ICD-10-CM | POA: Diagnosis not present

## 2018-01-13 NOTE — Telephone Encounter (Signed)
ATC home number on file and receive message that call could not be completed as dial, and the mobile number is not accepting calls at this time.  EC contact numbers on file are the same as pt's. Will call back

## 2018-01-14 ENCOUNTER — Other Ambulatory Visit: Payer: Self-pay | Admitting: Family Medicine

## 2018-01-14 DIAGNOSIS — R0600 Dyspnea, unspecified: Secondary | ICD-10-CM

## 2018-01-14 DIAGNOSIS — R0609 Other forms of dyspnea: Principal | ICD-10-CM

## 2018-01-16 NOTE — Telephone Encounter (Signed)
Attempted to call pt on both home and mobile number. Unable to leave VM for either phone number. Will sign off on message as this is the fourth attempt to reach pt without success. If pt calls back, will need to get a working number.

## 2018-01-17 ENCOUNTER — Ambulatory Visit: Payer: Medicare Other | Admitting: Pulmonary Disease

## 2018-01-19 DIAGNOSIS — E119 Type 2 diabetes mellitus without complications: Secondary | ICD-10-CM | POA: Diagnosis not present

## 2018-01-19 DIAGNOSIS — I10 Essential (primary) hypertension: Secondary | ICD-10-CM | POA: Diagnosis not present

## 2018-01-19 DIAGNOSIS — R600 Localized edema: Secondary | ICD-10-CM | POA: Diagnosis not present

## 2018-01-19 DIAGNOSIS — I890 Lymphedema, not elsewhere classified: Secondary | ICD-10-CM | POA: Diagnosis not present

## 2018-01-19 DIAGNOSIS — I872 Venous insufficiency (chronic) (peripheral): Secondary | ICD-10-CM | POA: Diagnosis not present

## 2018-01-25 ENCOUNTER — Ambulatory Visit (INDEPENDENT_AMBULATORY_CARE_PROVIDER_SITE_OTHER): Payer: Medicare Other | Admitting: Family Medicine

## 2018-01-25 VITALS — BP 151/78 | HR 94 | Temp 98.3°F | Resp 18 | Ht 70.0 in | Wt 221.0 lb

## 2018-01-25 DIAGNOSIS — G4730 Sleep apnea, unspecified: Secondary | ICD-10-CM

## 2018-01-25 DIAGNOSIS — I272 Pulmonary hypertension, unspecified: Secondary | ICD-10-CM | POA: Diagnosis not present

## 2018-01-25 DIAGNOSIS — R0609 Other forms of dyspnea: Secondary | ICD-10-CM

## 2018-01-25 DIAGNOSIS — E1165 Type 2 diabetes mellitus with hyperglycemia: Secondary | ICD-10-CM | POA: Diagnosis not present

## 2018-01-25 DIAGNOSIS — I10 Essential (primary) hypertension: Secondary | ICD-10-CM | POA: Diagnosis not present

## 2018-01-25 DIAGNOSIS — E785 Hyperlipidemia, unspecified: Secondary | ICD-10-CM

## 2018-01-25 DIAGNOSIS — R0600 Dyspnea, unspecified: Secondary | ICD-10-CM

## 2018-01-25 LAB — POCT GLYCOSYLATED HEMOGLOBIN (HGB A1C): Hemoglobin A1C: 6.9 % — AB (ref 4.0–5.6)

## 2018-01-25 LAB — POCT CBG (FASTING - GLUCOSE)-MANUAL ENTRY: Glucose Fasting, POC: 118 mg/dL — AB (ref 70–99)

## 2018-01-25 MED ORDER — IPRATROPIUM BROMIDE 0.02 % IN SOLN
0.5000 mg | Freq: Once | RESPIRATORY_TRACT | Status: AC
  Administered 2018-01-25: 0.5 mg via RESPIRATORY_TRACT

## 2018-01-25 MED ORDER — ALBUTEROL SULFATE (2.5 MG/3ML) 0.083% IN NEBU: 2.5000 mg | INHALATION_SOLUTION | Freq: Four times a day (QID) | RESPIRATORY_TRACT | 1 refills | Status: DC | PRN

## 2018-01-25 MED ORDER — ALBUTEROL SULFATE (2.5 MG/3ML) 0.083% IN NEBU
2.5000 mg | INHALATION_SOLUTION | Freq: Once | RESPIRATORY_TRACT | Status: AC
  Administered 2018-01-25: 2.5 mg via RESPIRATORY_TRACT
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
## 2018-01-25 NOTE — Patient Instructions (Signed)
Shortness of Breath, Adult Shortness of breath means you have trouble breathing. Your lungs are organs for breathing. Follow these instructions at home: Pay attention to any changes in your symptoms. Take these actions to help with your condition:  Do not smoke. Smoking can cause shortness of breath. If you need help to quit smoking, ask your doctor.  Avoid things that can make it harder to breathe, such as: ? Mold. ? Dust. ? Air pollution. ? Chemical smells. ? Things that can cause allergy symptoms (allergens), if you have allergies.  Keep your living space clean and free of mold and dust.  Rest as needed. Slowly return to your usual activities.  Take over-the-counter and prescription medicines, including oxygen and inhaled medicines, only as told by your doctor.  Keep all follow-up visits as told by your doctor. This is important.  Contact a doctor if:  Your condition does not get better as soon as expected.  You have a hard time doing your normal activities, even after you rest.  You have new symptoms. Get help right away if:  You have trouble breathing when you are resting.  You feel light-headed or you faint.  You have a cough that is not helped by medicines.  You cough up blood.  You have pain with breathing.  You have pain in your chest, arms, shoulders, or belly (abdomen).  You have a fever.  You cannot walk up stairs.  You cannot exercise the way you normally do. This information is not intended to replace advice given to you by your health care provider. Make sure you discuss any questions you have with your health care provider. Document Released: 10/13/2007 Document Revised: 05/13/2016 Document Reviewed: 05/13/2016 Elsevier Interactive Patient Education  2017 [employer] Pulmonary Hypertension Pulmonary hypertension is high blood pressure within the arteries in your lungs (pulmonary arteries). It is different than having high blood pressure elsewhere  in your body, such as blood pressure that is measured with a blood pressure cuff. Pulmonary hypertension makes it harder for blood to flow through the lungs. As a result, the heart must work harder to pump blood through the lungs, and it may be harder for you to breathe. Over time, this can weaken the heart muscle. Pulmonary hypertension is a serious condition and it can be fatal. What are the causes? Many different medical conditions can cause pulmonary hypertension. Pulmonary hypertension can be categorized by cause into five groups: Group 1 Pulmonary hypertension that is caused by abnormal growth of small blood vessels in the lungs (pulmonary arterial hypertension). The abnormal blood vessel growth may have no known cause, or it may be:  Passed along from a parent (hereditary).  Caused by another disease, such as a connective tissue disease (including lupus or scleroderma) or HIV.  Caused by certain drugs or toxins.  Group 2 Pulmonary hypertension that is caused by weakness of the main chamber of the heart (left ventricle) or heart valve disease. Group 3 Pulmonary hypertension that is caused by lung disease or low oxygen levels. Causes in this group include:  Emphysema or chronic obstructive pulmonary disease (COPD).  Untreated sleep apnea.  Pulmonary fibrosis.  Group 4 Pulmonary hypertension that is caused by blood clots in the lungs (pulmonary emboli). Group 5 Other causes of pulmonary hypertension, such as sickle cell anemia, or a mix of multiple causes. What are the signs or symptoms? Symptoms of this condition include:  Shortness of breath. You may notice shortness of breath with: ? Activity, such as  walking. ? No activity.  Tiredness and fatigue.  Dizziness or fainting.  Rapid heartbeat or feeling your heart flutter or skip a beat (palpitations).  Neck vein enlargement.  Bluish color to your lips and fingertips.  How is this diagnosed? This condition may be  diagnosed by:  Chest X-ray.  Arterial blood gases. This test checks the acidity of your blood as well as your blood oxygen and carbon dioxide levels.  CT scan. This test can provide detailed images of your lungs.  Pulmonary function test. This test measures how much air your lungs can hold. It also tests how well air moves in and out of your lungs.  Electrocardiogram (ECG). This test traces the electrical activity of your heart.  Echocardiogram. This test is used to look at your heart in motion and check how it is functioning.  Heart catheterization. This test can measure the pressure in your pulmonary artery and the right side of your heart.  Lung biopsy. This procedure involves checking a sample of lung tissue to find underlying causes.  How is this treated? There is no cure for pulmonary hypertension, but treatment can help to relieve symptoms and slow the progress of the condition. Treatment can involve:  Medicines, such as: ? Blood pressure medicines. ? Medicines to relax (dilate) the pulmonary blood vessels. ? Water pills to get rid of extra fluid (diuretic medicines). ? Blood-thinning medicines.  Surgery. For severe pulmonary hypertension that does not respond to medical treatment, heart-lung or lung transplant may be needed.  Follow these instructions at home:  Take medicines only as directed by your health care provider. These include over-the-counter medicines and prescription medicines. Take all medicines exactly as instructed. Do not change or stop medicines without first checking with your health care provider.  Do not smoke. If you need help quitting, ask your health care provider.  Eat a healthy diet.  Limit your salt (sodium) intake to less than 2,300 mg per day.  Stay as active as possible. Exercise as directed by your health care provider. Talk with your health care provider about what type of exercise is safe for you.  Avoid high altitudes.  Avoid hot  tubs and saunas.  Avoid becoming pregnant, if this applies. Talk with your health care provider about safe methods of birth control.  Keep all follow-up visits as directed by your health care provider. This is important. Get help right away if:  You have severe shortness of breath.  You develop chest pain or pressure in your chest.  You cough up blood.  You develop swelling of your feet or legs.  You have a significant increase in weight within 1-2 days. This information is not intended to replace advice given to you by your health care provider. Make sure you discuss any questions you have with your health care provider. Document Released: 02/21/2007 Document Revised: 11/14/2015 Document Reviewed: 10/16/2012 Elsevier Interactive Patient Education  2018 [employer].

## 2018-01-26 ENCOUNTER — Telehealth: Payer: Self-pay

## 2018-01-26 LAB — CBC WITH DIFFERENTIAL/PLATELET
Basophils Absolute: 0 10*3/uL (ref 0.0–0.2)
Basos: 0 %
EOS (ABSOLUTE): 0.4 10*3/uL (ref 0.0–0.4)
Eos: 6 %
Hematocrit: 40.5 % (ref 37.5–51.0)
Hemoglobin: 12.8 g/dL — ABNORMAL LOW (ref 13.0–17.7)
Immature Grans (Abs): 0 10*3/uL (ref 0.0–0.1)
Immature Granulocytes: 1 %
Lymphocytes Absolute: 0.7 10*3/uL (ref 0.7–3.1)
Lymphs: 12 %
MCH: 24.4 pg — ABNORMAL LOW (ref 26.6–33.0)
MCHC: 31.6 g/dL (ref 31.5–35.7)
MCV: 77 fL — ABNORMAL LOW (ref 79–97)
Monocytes Absolute: 0.6 10*3/uL (ref 0.1–0.9)
Monocytes: 10 %
Neutrophils Absolute: 4.3 10*3/uL (ref 1.4–7.0)
Neutrophils: 71 %
Platelets: 210 10*3/uL (ref 150–450)
RBC: 5.24 x10E6/uL (ref 4.14–5.80)
RDW: 17.2 % — ABNORMAL HIGH (ref 12.3–15.4)
WBC: 6 10*3/uL (ref 3.4–10.8)

## 2018-01-26 LAB — COMPREHENSIVE METABOLIC PANEL
ALT: 9 IU/L (ref 0–44)
AST: 13 IU/L (ref 0–40)
Albumin/Globulin Ratio: 1.6 (ref 1.2–2.2)
Albumin: 4.2 g/dL (ref 3.6–4.8)
Alkaline Phosphatase: 75 IU/L (ref 39–117)
BUN/Creatinine Ratio: 14 (ref 10–24)
BUN: 20 mg/dL (ref 8–27)
Bilirubin Total: 0.5 mg/dL (ref 0.0–1.2)
CO2: 23 mmol/L (ref 20–29)
Calcium: 9.4 mg/dL (ref 8.6–10.2)
Chloride: 99 mmol/L (ref 96–106)
Creatinine, Ser: 1.46 mg/dL — ABNORMAL HIGH (ref 0.76–1.27)
GFR calc Af Amer: 59 mL/min/{1.73_m2} — ABNORMAL LOW (ref >59)
GFR calc non Af Amer: 51 mL/min/{1.73_m2} — ABNORMAL LOW (ref >59)
Globulin, Total: 2.6 g/dL (ref 1.5–4.5)
Glucose: 124 mg/dL — ABNORMAL HIGH (ref 65–99)
Potassium: 4 mmol/L (ref 3.5–5.2)
Sodium: 139 mmol/L (ref 134–144)
Total Protein: 6.8 g/dL (ref 6.0–8.5)

## 2018-01-26 NOTE — Telephone Encounter (Signed)
Called and spoke with patient, he needs a cmn form filled out. I advised he will need to have the pharmacy fax it to [place]. Thanks!

## 2018-01-27 ENCOUNTER — Telehealth: Payer: Self-pay

## 2018-01-27 ENCOUNTER — Encounter (HOSPITAL_COMMUNITY): Payer: Self-pay | Admitting: Emergency Medicine

## 2018-01-27 ENCOUNTER — Ambulatory Visit (INDEPENDENT_AMBULATORY_CARE_PROVIDER_SITE_OTHER): Payer: Medicare Other | Admitting: Pulmonary Disease

## 2018-01-27 ENCOUNTER — Encounter: Payer: Self-pay | Admitting: Pulmonary Disease

## 2018-01-27 ENCOUNTER — Encounter: Payer: Self-pay | Admitting: Family Medicine

## 2018-01-27 ENCOUNTER — Emergency Department (HOSPITAL_COMMUNITY): Payer: Medicare Other

## 2018-01-27 ENCOUNTER — Observation Stay (HOSPITAL_COMMUNITY)
Admission: EM | Admit: 2018-01-27 | Discharge: 2018-01-28 | Disposition: A | Discharge status: Home / Self Care | Payer: Medicare Other | Attending: Internal Medicine | Admitting: Internal Medicine

## 2018-01-27 VITALS — BP 154/84 | HR 85 | Ht 68.5 in | Wt 223.0 lb

## 2018-01-27 DIAGNOSIS — E785 Hyperlipidemia, unspecified: Secondary | ICD-10-CM | POA: Insufficient documentation

## 2018-01-27 DIAGNOSIS — N183 Chronic kidney disease, stage 3 unspecified: Secondary | ICD-10-CM | POA: Diagnosis present

## 2018-01-27 DIAGNOSIS — R0602 Shortness of breath: Secondary | ICD-10-CM

## 2018-01-27 DIAGNOSIS — I272 Pulmonary hypertension, unspecified: Secondary | ICD-10-CM

## 2018-01-27 DIAGNOSIS — K219 Gastro-esophageal reflux disease without esophagitis: Secondary | ICD-10-CM | POA: Diagnosis not present

## 2018-01-27 DIAGNOSIS — I872 Venous insufficiency (chronic) (peripheral): Secondary | ICD-10-CM | POA: Diagnosis present

## 2018-01-27 DIAGNOSIS — I482 Chronic atrial fibrillation, unspecified: Secondary | ICD-10-CM | POA: Diagnosis present

## 2018-01-27 DIAGNOSIS — I4891 Unspecified atrial fibrillation: Secondary | ICD-10-CM

## 2018-01-27 DIAGNOSIS — G8929 Other chronic pain: Secondary | ICD-10-CM | POA: Insufficient documentation

## 2018-01-27 DIAGNOSIS — Z79899 Other long term (current) drug therapy: Secondary | ICD-10-CM | POA: Insufficient documentation

## 2018-01-27 DIAGNOSIS — R768 Other specified abnormal immunological findings in serum: Secondary | ICD-10-CM | POA: Diagnosis not present

## 2018-01-27 DIAGNOSIS — Z79891 Long term (current) use of opiate analgesic: Secondary | ICD-10-CM | POA: Diagnosis not present

## 2018-01-27 DIAGNOSIS — IMO0002 Reserved for concepts with insufficient information to code with codable children: Secondary | ICD-10-CM | POA: Diagnosis present

## 2018-01-27 DIAGNOSIS — E11649 Type 2 diabetes mellitus with hypoglycemia without coma: Principal | ICD-10-CM | POA: Insufficient documentation

## 2018-01-27 DIAGNOSIS — E162 Hypoglycemia, unspecified: Secondary | ICD-10-CM

## 2018-01-27 DIAGNOSIS — I509 Heart failure, unspecified: Secondary | ICD-10-CM

## 2018-01-27 DIAGNOSIS — Z7982 Long term (current) use of aspirin: Secondary | ICD-10-CM | POA: Diagnosis not present

## 2018-01-27 DIAGNOSIS — I10 Essential (primary) hypertension: Secondary | ICD-10-CM | POA: Diagnosis present

## 2018-01-27 DIAGNOSIS — I132 Hypertensive heart and chronic kidney disease with heart failure and with stage 5 chronic kidney disease, or end stage renal disease: Secondary | ICD-10-CM | POA: Diagnosis not present

## 2018-01-27 DIAGNOSIS — Z940 Kidney transplant status: Secondary | ICD-10-CM | POA: Diagnosis not present

## 2018-01-27 DIAGNOSIS — E876 Hypokalemia: Secondary | ICD-10-CM | POA: Diagnosis not present

## 2018-01-27 DIAGNOSIS — R404 Transient alteration of awareness: Secondary | ICD-10-CM | POA: Diagnosis not present

## 2018-01-27 DIAGNOSIS — E1122 Type 2 diabetes mellitus with diabetic chronic kidney disease: Secondary | ICD-10-CM | POA: Insufficient documentation

## 2018-01-27 DIAGNOSIS — I4892 Unspecified atrial flutter: Secondary | ICD-10-CM | POA: Diagnosis present

## 2018-01-27 DIAGNOSIS — Z794 Long term (current) use of insulin: Secondary | ICD-10-CM | POA: Diagnosis not present

## 2018-01-27 DIAGNOSIS — E161 Other hypoglycemia: Secondary | ICD-10-CM | POA: Diagnosis not present

## 2018-01-27 DIAGNOSIS — Z96652 Presence of left artificial knee joint: Secondary | ICD-10-CM | POA: Diagnosis not present

## 2018-01-27 DIAGNOSIS — IMO0001 Reserved for inherently not codable concepts without codable children: Secondary | ICD-10-CM

## 2018-01-27 DIAGNOSIS — I5032 Chronic diastolic (congestive) heart failure: Secondary | ICD-10-CM | POA: Insufficient documentation

## 2018-01-27 DIAGNOSIS — E1165 Type 2 diabetes mellitus with hyperglycemia: Secondary | ICD-10-CM | POA: Diagnosis present

## 2018-01-27 LAB — PULMONARY FUNCTION TEST
DL/VA % pred: 100 %
DL/VA: 4.55 ml/min/mmHg/L
DLCO cor % pred: 52 %
DLCO cor: 15.84 ml/min/mmHg
DLCO unc % pred: 49 %
DLCO unc: 14.97 ml/min/mmHg
FEF 25-75 Post: 1.48 L/sec
FEF 25-75 Pre: 1.81 L/sec
FEF2575-%Change-Post: -18 %
FEF2575-%Pred-Post: 55 %
FEF2575-%Pred-Pre: 67 %
FEV1-%Change-Post: -3 %
FEV1-%Pred-Post: 52 %
FEV1-%Pred-Pre: 54 %
FEV1-Post: 1.52 L
FEV1-Pre: 1.58 L
FEV1FVC-%Change-Post: -1 %
FEV1FVC-%Pred-Pre: 106 %
FEV6-%Change-Post: -2 %
FEV6-%Pred-Post: 51 %
FEV6-%Pred-Pre: 52 %
FEV6-Post: 1.86 L
FEV6-Pre: 1.9 L
FEV6FVC-%Pred-Post: 104 %
FEV6FVC-%Pred-Pre: 104 %
FVC-%Change-Post: -2 %
FVC-%Pred-Post: 49 %
FVC-%Pred-Pre: 50 %
FVC-Post: 1.86 L
FVC-Pre: 1.9 L
Post FEV1/FVC ratio: 82 %
Post FEV6/FVC ratio: 100 %
Pre FEV1/FVC ratio: 83 %
Pre FEV6/FVC Ratio: 100 %

## 2018-01-27 LAB — COMPREHENSIVE METABOLIC PANEL
ALT: 11 U/L (ref 0–44)
AST: 22 U/L (ref 15–41)
Albumin: 3.7 g/dL (ref 3.5–5.0)
Alkaline Phosphatase: 65 U/L (ref 38–126)
Anion gap: 11 (ref 5–15)
BUN: 21 mg/dL (ref 8–23)
CO2: 25 mmol/L (ref 22–32)
Calcium: 9.6 mg/dL (ref 8.9–10.3)
Chloride: 104 mmol/L (ref 98–111)
Creatinine, Ser: 1.59 mg/dL — ABNORMAL HIGH (ref 0.61–1.24)
GFR calc Af Amer: 52 mL/min — ABNORMAL LOW (ref >60)
GFR calc non Af Amer: 45 mL/min — ABNORMAL LOW (ref >60)
Glucose, Bld: 56 mg/dL — ABNORMAL LOW (ref 70–99)
Potassium: 2.9 mmol/L — ABNORMAL LOW (ref 3.5–5.1)
Sodium: 140 mmol/L (ref 135–145)
Total Bilirubin: 1 mg/dL (ref 0.3–1.2)
Total Protein: 7 g/dL (ref 6.5–8.1)

## 2018-01-27 LAB — URINALYSIS, ROUTINE W REFLEX MICROSCOPIC
Bacteria, UA: NONE SEEN
Bilirubin Urine: NEGATIVE
Glucose, UA: NEGATIVE mg/dL
Hgb urine dipstick: NEGATIVE
Ketones, ur: NEGATIVE mg/dL
Leukocytes, UA: NEGATIVE
Nitrite: NEGATIVE
Protein, ur: 100 mg/dL — AB
Specific Gravity, Urine: 1.008 (ref 1.005–1.030)
pH: 6 (ref 5.0–8.0)

## 2018-01-27 LAB — CBC WITH DIFFERENTIAL/PLATELET
Abs Immature Granulocytes: 0 10*3/uL (ref 0.0–0.1)
Basophils Absolute: 0 10*3/uL (ref 0.0–0.1)
Basophils Relative: 1 %
Eosinophils Absolute: 0.4 10*3/uL (ref 0.0–0.7)
Eosinophils Relative: 4 %
HCT: 43 % (ref 39.0–52.0)
Hemoglobin: 12.8 g/dL — ABNORMAL LOW (ref 13.0–17.0)
Immature Granulocytes: 0 %
Lymphocytes Relative: 8 %
Lymphs Abs: 0.7 10*3/uL (ref 0.7–4.0)
MCH: 24.5 pg — ABNORMAL LOW (ref 26.0–34.0)
MCHC: 29.8 g/dL — ABNORMAL LOW (ref 30.0–36.0)
MCV: 82.4 fL (ref 78.0–100.0)
Monocytes Absolute: 0.8 10*3/uL (ref 0.1–1.0)
Monocytes Relative: 9 %
Neutro Abs: 6.3 10*3/uL (ref 1.7–7.7)
Neutrophils Relative %: 78 %
Platelets: 241 10*3/uL (ref 150–400)
RBC: 5.22 MIL/uL (ref 4.22–5.81)
RDW: 17 % — ABNORMAL HIGH (ref 11.5–15.5)
WBC: 8.1 10*3/uL (ref 4.0–10.5)

## 2018-01-27 LAB — CBG MONITORING, ED
Glucose-Capillary: 57 mg/dL — ABNORMAL LOW (ref 70–99)
Glucose-Capillary: 63 mg/dL — ABNORMAL LOW (ref 70–99)
Glucose-Capillary: 158 mg/dL — ABNORMAL HIGH (ref 70–99)
Glucose-Capillary: 197 mg/dL — ABNORMAL HIGH (ref 70–99)

## 2018-01-27 LAB — MAGNESIUM: Magnesium: 1.9 mg/dL (ref 1.7–2.4)

## 2018-01-27 LAB — BRAIN NATRIURETIC PEPTIDE: B Natriuretic Peptide: 376.4 pg/mL — ABNORMAL HIGH (ref 0.0–100.0)

## 2018-01-27 LAB — GLUCOSE, CAPILLARY: Glucose-Capillary: 226 mg/dL — ABNORMAL HIGH (ref 70–99)

## 2018-01-27 LAB — I-STAT TROPONIN, ED: Troponin i, poc: 0 ng/mL (ref 0.00–0.08)

## 2018-01-27 MED ORDER — ALBUTEROL SULFATE (2.5 MG/3ML) 0.083% IN NEBU: 2.5000 mg | INHALATION_SOLUTION | Freq: Four times a day (QID) | RESPIRATORY_TRACT | Status: DC | PRN

## 2018-01-27 MED ORDER — DEXTROSE 50 % IV SOLN
25.0000 mL | Freq: Once | INTRAVENOUS | Status: AC
  Administered 2018-01-27: 25 mL via INTRAVENOUS
  Filled 2018-01-27: qty 50

## 2018-01-27 MED ORDER — METHADONE HCL 10 MG PO TABS: 115.0000 mg | ORAL_TABLET | Freq: Every day | ORAL | Status: DC

## 2018-01-27 MED ORDER — POTASSIUM CHLORIDE IN NACL 40-0.9 MEQ/L-% IV SOLN
INTRAVENOUS | Status: DC
  Administered 2018-01-28: 100 mL/h via INTRAVENOUS
  Filled 2018-01-27: qty 1000

## 2018-01-27 MED ORDER — TACROLIMUS 1 MG PO CAPS
1.0000 mg | ORAL_CAPSULE | Freq: Every day | ORAL | Status: DC
  Administered 2018-01-27: 1 mg via ORAL
  Filled 2018-01-27: qty 1

## 2018-01-27 MED ORDER — CLONIDINE HCL 0.3 MG PO TABS
0.1500 mg | ORAL_TABLET | Freq: Two times a day (BID) | ORAL | Status: DC
  Administered 2018-01-27 – 2018-01-28 (×2): 0.15 mg via ORAL
  Filled 2018-01-27 (×2): qty 2

## 2018-01-27 MED ORDER — PANTOPRAZOLE SODIUM 40 MG PO TBEC
40.0000 mg | DELAYED_RELEASE_TABLET | Freq: Every day | ORAL | Status: DC
  Administered 2018-01-28: 40 mg via ORAL
  Filled 2018-01-27 (×2): qty 1

## 2018-01-27 MED ORDER — POTASSIUM CHLORIDE CRYS ER 20 MEQ PO TBCR
40.0000 meq | EXTENDED_RELEASE_TABLET | Freq: Once | ORAL | Status: AC
  Administered 2018-01-27: 40 meq via ORAL
  Filled 2018-01-27: qty 2

## 2018-01-27 MED ORDER — ALBUTEROL SULFATE HFA 108 (90 BASE) MCG/ACT IN AERS: 2.0000 | INHALATION_SPRAY | Freq: Four times a day (QID) | RESPIRATORY_TRACT | Status: DC | PRN

## 2018-01-27 MED ORDER — FUROSEMIDE 20 MG PO TABS: 40.0000 mg | ORAL_TABLET | Freq: Two times a day (BID) | ORAL | 0 refills | Status: DC

## 2018-01-27 MED ORDER — CARVEDILOL 25 MG PO TABS: 25.0000 mg | ORAL_TABLET | Freq: Two times a day (BID) | ORAL | 3 refills | Status: DC

## 2018-01-27 MED ORDER — PANTOPRAZOLE SODIUM 40 MG PO TBEC: 40.0000 mg | DELAYED_RELEASE_TABLET | Freq: Every day | ORAL | Status: DC

## 2018-01-27 MED ORDER — SULFAMETHOXAZOLE-TRIMETHOPRIM 400-80 MG PO TABS: 1.0000 | ORAL_TABLET | ORAL | Status: DC

## 2018-01-27 MED ORDER — POLYSACCHARIDE IRON COMPLEX 150 MG PO CAPS
150.0000 mg | ORAL_CAPSULE | Freq: Every day | ORAL | Status: DC
  Administered 2018-01-28: 150 mg via ORAL
  Filled 2018-01-27: qty 1

## 2018-01-27 MED ORDER — ASPIRIN EC 81 MG PO TBEC
81.0000 mg | DELAYED_RELEASE_TABLET | Freq: Every day | ORAL | Status: DC
  Administered 2018-01-28: 81 mg via ORAL
  Filled 2018-01-27 (×2): qty 1

## 2018-01-27 MED ORDER — POTASSIUM CHLORIDE 10 MEQ/100ML IV SOLN
10.0000 meq | INTRAVENOUS | Status: AC
  Administered 2018-01-27 (×3): 10 meq via INTRAVENOUS
  Filled 2018-01-27 (×3): qty 100

## 2018-01-27 MED ORDER — ENOXAPARIN SODIUM 40 MG/0.4ML ~~LOC~~ SOLN
40.0000 mg | SUBCUTANEOUS | Status: DC
  Administered 2018-01-27: 40 mg via SUBCUTANEOUS
  Filled 2018-01-27: qty 0.4

## 2018-01-27 MED ORDER — NIFEDIPINE ER OSMOTIC RELEASE 60 MG PO TB24
60.0000 mg | ORAL_TABLET | Freq: Every day | ORAL | Status: DC
  Administered 2018-01-28: 60 mg via ORAL
  Filled 2018-01-27: qty 1

## 2018-01-27 MED ORDER — KCL IN DEXTROSE-NACL 40-5-0.45 MEQ/L-%-% IV SOLN
INTRAVENOUS | Status: DC
  Filled 2018-01-27: qty 1000

## 2018-01-27 MED ORDER — DEXTROSE-NACL 5-0.45 % IV SOLN
INTRAVENOUS | Status: DC
  Administered 2018-01-27: 17:00:00 via INTRAVENOUS

## 2018-01-27 MED ORDER — PREGABALIN 100 MG PO CAPS
100.0000 mg | ORAL_CAPSULE | Freq: Three times a day (TID) | ORAL | Status: DC
  Administered 2018-01-27 – 2018-01-28 (×2): 100 mg via ORAL
  Filled 2018-01-27 (×2): qty 1

## 2018-01-27 MED ORDER — FUROSEMIDE 10 MG/ML IJ SOLN
40.0000 mg | Freq: Once | INTRAMUSCULAR | Status: AC
  Administered 2018-01-27: 40 mg via INTRAVENOUS
  Filled 2018-01-27: qty 4

## 2018-01-27 MED ORDER — FUROSEMIDE 40 MG PO TABS
40.0000 mg | ORAL_TABLET | Freq: Two times a day (BID) | ORAL | Status: DC
  Administered 2018-01-28: 40 mg via ORAL
  Filled 2018-01-27: qty 1

## 2018-01-27 MED ORDER — TACROLIMUS 1 MG PO CAPS
2.0000 mg | ORAL_CAPSULE | Freq: Every day | ORAL | Status: DC
  Administered 2018-01-28: 2 mg via ORAL
  Filled 2018-01-27: qty 2

## 2018-01-27 MED ORDER — MAGNESIUM OXIDE 400 (241.3 MG) MG PO TABS
400.0000 mg | ORAL_TABLET | Freq: Every day | ORAL | Status: DC
  Administered 2018-01-28: 400 mg via ORAL
  Filled 2018-01-27: qty 1

## 2018-01-27 MED ORDER — MYCOPHENOLATE SODIUM 180 MG PO TBEC
540.0000 mg | DELAYED_RELEASE_TABLET | Freq: Two times a day (BID) | ORAL | Status: DC
  Administered 2018-01-27 – 2018-01-28 (×2): 540 mg via ORAL
  Filled 2018-01-27 (×2): qty 3

## 2018-01-27 MED ORDER — CARVEDILOL 25 MG PO TABS
25.0000 mg | ORAL_TABLET | Freq: Two times a day (BID) | ORAL | Status: DC
  Administered 2018-01-28: 25 mg via ORAL
  Filled 2018-01-27: qty 1

## 2018-01-27 MED ORDER — PRAVASTATIN SODIUM 40 MG PO TABS
40.0000 mg | ORAL_TABLET | Freq: Every day | ORAL | Status: DC
  Administered 2018-01-28: 40 mg via ORAL
  Filled 2018-01-27: qty 1

## 2018-01-27 NOTE — Progress Notes (Signed)
[name]    [id number]    [birth year]  Primary Care Physician:Douglas, Venora Maples, [place]  Referring Physician: Dorena Dew, FNP [address] [place], [place] [address]  Chief complaint: Follow up for dyspnea, pulmonary hypertension  HPI: 62 year old with history of kidney transplant, atrial fibrillation, diabetes mellitus, HFpEF Admitted to [name] in May 2019 with dyspnea, hemoptysis and found to have bilateral infiltrates. VQ scan ruled out pulmonary embolism.  Echocardiogram showed preserved ejection fraction with diastolic dysfunction, LVH, elevated pulmonary artery pressures and dilated IVC. He was diuresesed about 2.5 L with improvement in symptoms.  Treated with azithromycin and discharged on oral Augmentin for presumed pneumonia.  Sent here for evaluation of dyspnea, pulmonary hypertension.  States that he has dyspnea with activity, relieved with rest.  No cough, sputum production, wheezing.  Has significant orthopnea, lower extremity edema He has been evaluated by Dr. Bridgett Larsson, Vascular surgery for chronic lymphedema, venous stasis of the lower extremities and recommended sequential compression.  Has history of atrial fibrillation, diastolic heart failure.  Used to follow with cardiology at [facility].  He was last seen in 2016 at which point he was on amiodarone, apixaban anticoagulation and noted to be in normal sinus rhythm.  He has not had a follow-up since then.  Currently he is not on any anticoagulation and is on rate control with Coreg Split-night sleep study has been ordered by primary care and is pending.  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  Currently on disability Exposures: No known exposures, no leak, mold, hot tub, Jacuzzi Smoking history: Never smoker Travel history: Previously lived in [place] in [place].  Moved to [facility] 2003  Interim history: Hospitalized in August 2019  with altered mental status due to suspected unintentional overdose with methadone Complains of dyspnea on exertion, no cough.  Denies fevers, chills, wheezing  Outpatient Encounter Medications as of 01/27/2018  Medication Sig  . aspirin EC 81 MG tablet Take 81 mg by mouth daily.  . carvedilol (COREG) 25 MG tablet Take 25 mg by mouth 2 (two) times daily with a meal.   . cloNIDine (CATAPRES) 0.3 MG tablet Take 0.15 mg by mouth 2 (two) times daily.   . furosemide (LASIX) 20 MG tablet Take 1 tablet (20 mg total) by mouth 2 (two) times daily.  . insulin aspart protamine- aspart (NOVOLOG MIX 70/30) (70-30) 100 UNIT/ML injection Injects 8 units every morning and injects 8 units every evening (follow up with your PCP for adjustments)  . magnesium oxide (MAG-OX) 400 MG tablet Take 400 mg by mouth daily.   . methadone (DOLOPHINE) 10 MG/5ML solution Take 42.5 mLs (85 mg total) by mouth daily. Goes to a clinic every day ([facility] to your dose)  . mycophenolate (MYFORTIC) 180 MG EC tablet Take 540 mg by mouth 2 (two) times daily.   Marland Kitchen NIFEdipine (PROCARDIA XL/ADALAT-CC) 60 MG 24 hr tablet Take 60 mg by mouth daily.  . pantoprazole (PROTONIX) 40 MG tablet Take 1 tablet (40 mg total) by mouth daily at 12 noon.  Marland Kitchen POLY-IRON 150 150 MG capsule Take 150 mg by mouth daily.  . pravastatin (PRAVACHOL) 40 MG tablet Take 40 mg by mouth daily.   . pregabalin (LYRICA) 100 MG capsule Take 100 mg by mouth 3 (three) times daily.   Marland Kitchen Spacer/Aero Chamber Mouthpiece MISC 1 each by  Does not apply route every 6 (six) hours as needed.  . tacrolimus (PROGRAF) 1 MG capsule Take 1-2 mg by mouth See admin instructions. Take 2 mg by mouth in the morning and 1 mg in the evening  . VENTOLIN HFA 108 (90 Base) MCG/ACT inhaler INHALE 2 PUFFS INTO THE LUNGS EVERY 6 HOURS AS NEEDED FOR WHEEZING OR SHORTNESS OF BREATH  . albuterol (PROVENTIL) (2.5 MG/3ML) 0.083% nebulizer solution Take 3 mLs (2.5 mg  total) by nebulization every 6 (six) hours as needed for wheezing or shortness of breath. (Patient not taking: Reported on 01/27/2018)   No facility-administered encounter medications on file as of 01/27/2018.    Physical Exam: Blood pressure (!) 154/84, pulse 85, height 5' 8.5" (1.74 m), weight 101.2 kg, SpO2 95 %. Gen:      No acute distress HEENT:  EOMI, sclera anicteric Neck:     No masses; no thyromegaly Lungs:    Clear to auscultation bilaterally; normal respiratory effort CV:         Regular rate and rhythm; no murmurs Abd:      + bowel sounds; soft, non-tender; no palpable masses, no distension Ext:    No edema; adequate peripheral perfusion Skin:      Warm and dry; no rash Neuro: alert and oriented x 3 Psych: normal mood and affect  Data Reviewed: Imaging CT chest 04/06/2014- clear lungs bilaterally. Chest x-ray 09/23/2017- bilateral airspace opacities. Chest x-ray 10/05/2017-improving aeration bilaterally. Chest x-ray 10/25/2017-cardiomegaly, mild interstitial prominence I have reviewed the images personally.  PFTs 01/27/2018-FVC 1.86 (49%), FEV1 1.52 [52%), F/F 82, TLC 66%, DLCO 49%, DLCO/VA 100% Severe restriction, moderate-severe diffusion defect that corrects for alveolar volume.  Cardiac Echocardiogram 09/24/2017- LVEF 55-60%, atrial enlargement, moderate LVH, mild to moderate pulmonary hypertension.  PA peak pressure 48.  Mildly dilated IVC.  Assessment:  Follow-up for dyspnea, pulmonary hypertension PFTs reviewed with no obstruction, there is moderate restriction and diffusion impairment He is a non-smoker with no evidence of COPD or interstitial lung disease  Suspect that dyspnea and elevated PA pressures are secondary to pulmonary venous hypertension from atrial fibrillation, HFpEF and sleep apnea. Still has lower extremity edema.  Will increase Lasix to 40 mg twice daily Has follow-up with cardiology on 10/3  Suspected OSA Scheduled for sleep study by primary  care.  Will follow results when available.  Plan/Recommendations: - Chest x-ray - Increase Lasix to 40 mg twice daily, cadiology follow up - Follow sleep study results.  Marshell Garfinkel MD [facility] 01/27/2018, 1:58 PM  CC: Dorena Dew, FNP
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
## 2018-01-27 NOTE — Progress Notes (Signed)
Received report from ED RN. Room ready for patient. Marleni Gallardo Joselita, RN 

## 2018-01-27 NOTE — Progress Notes (Signed)
New Admission Note:   Arrival Method: Arrived from ED via stretcher Mental Orientation: Alert and orientedx4 Telemetry: Box #16 Assessment: Completed Skin: See doc flowsheet IV: Rt EJ Pain: Denies Tubes: N/A Safety Measures: Safety Fall Prevention Plan has been  Discussed. Admission: Completed 5MW Orientation: Patient has been orientated to the room, unit and staff.  Family: Wife at bedside  Orders have been reviewed and implemented. Will continue to monitor the patient. Call light has been placed within reach and bed alarm has been activated.   [name] [employer], RN-BC Phone number: [phone]

## 2018-01-27 NOTE — ED Notes (Signed)
Patient transported to X-ray 
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
## 2018-01-27 NOTE — Progress Notes (Signed)
PFT completed today.  

## 2018-01-27 NOTE — ED Triage Notes (Signed)
Per EMS pt was at dr appt, CHF h/s with SOB for a couple weeks. Pt diabetic ate breakfast and took insulin did not eat lunch. Pt was on hands and knees crawling on ground. Pt was lethargic. CBG was 37. D50 amp given. EJ L neck. CBG now 158. Former dialysis patient did receive kidney transplant 2-3 years ago.

## 2018-01-27 NOTE — Patient Instructions (Addendum)
We will get a chest x-ray to reevaluate the lung Make sure you follow-up on the sleep study that was ordered by primary care Increase Lasix to 40 mg twice daily Follow-up with cardiology.  Follow-up in pulmonary clinic in 3 months.

## 2018-01-27 NOTE — Telephone Encounter (Signed)
Refill sent into pharmacy. Thanks!  

## 2018-01-28 ENCOUNTER — Other Ambulatory Visit: Payer: Self-pay

## 2018-01-28 DIAGNOSIS — I509 Heart failure, unspecified: Secondary | ICD-10-CM

## 2018-01-28 DIAGNOSIS — E162 Hypoglycemia, unspecified: Secondary | ICD-10-CM | POA: Diagnosis not present

## 2018-01-28 DIAGNOSIS — E11649 Type 2 diabetes mellitus with hypoglycemia without coma: Secondary | ICD-10-CM | POA: Diagnosis not present

## 2018-01-28 LAB — CBC
HCT: 44.4 % (ref 39.0–52.0)
Hemoglobin: 13.2 g/dL (ref 13.0–17.0)
MCH: 24.3 pg — ABNORMAL LOW (ref 26.0–34.0)
MCHC: 29.7 g/dL — ABNORMAL LOW (ref 30.0–36.0)
MCV: 81.8 fL (ref 78.0–100.0)
Platelets: 248 10*3/uL (ref 150–400)
RBC: 5.43 MIL/uL (ref 4.22–5.81)
RDW: 17.1 % — ABNORMAL HIGH (ref 11.5–15.5)
WBC: 6.3 10*3/uL (ref 4.0–10.5)

## 2018-01-28 LAB — BASIC METABOLIC PANEL
Anion gap: 13 (ref 5–15)
Anion gap: 8 (ref 5–15)
BUN: 21 mg/dL (ref 8–23)
BUN: 19 mg/dL (ref 8–23)
CO2: 23 mmol/L (ref 22–32)
CO2: 24 mmol/L (ref 22–32)
Calcium: 9.9 mg/dL (ref 8.9–10.3)
Calcium: 9.1 mg/dL (ref 8.9–10.3)
Chloride: 102 mmol/L (ref 98–111)
Chloride: 105 mmol/L (ref 98–111)
Creatinine, Ser: 1.49 mg/dL — ABNORMAL HIGH (ref 0.61–1.24)
Creatinine, Ser: 1.44 mg/dL — ABNORMAL HIGH (ref 0.61–1.24)
GFR calc Af Amer: 56 mL/min — ABNORMAL LOW (ref >60)
GFR calc Af Amer: 59 mL/min — ABNORMAL LOW (ref >60)
GFR calc non Af Amer: 49 mL/min — ABNORMAL LOW (ref >60)
GFR calc non Af Amer: 51 mL/min — ABNORMAL LOW (ref >60)
Glucose, Bld: 242 mg/dL — ABNORMAL HIGH (ref 70–99)
Glucose, Bld: 227 mg/dL — ABNORMAL HIGH (ref 70–99)
Potassium: 4.7 mmol/L (ref 3.5–5.1)
Potassium: 5.2 mmol/L — ABNORMAL HIGH (ref 3.5–5.1)
Sodium: 138 mmol/L (ref 135–145)
Sodium: 137 mmol/L (ref 135–145)

## 2018-01-28 LAB — MAGNESIUM: Magnesium: 1.7 mg/dL (ref 1.7–2.4)

## 2018-01-28 LAB — GLUCOSE, CAPILLARY
Glucose-Capillary: 212 mg/dL — ABNORMAL HIGH (ref 70–99)
Glucose-Capillary: 193 mg/dL — ABNORMAL HIGH (ref 70–99)

## 2018-01-28 MED ORDER — INSULIN ASPART PROT & ASPART (70-30 MIX) 100 UNIT/ML ~~LOC~~ SUSP
5.0000 [IU] | Freq: Two times a day (BID) | SUBCUTANEOUS | Status: DC
  Administered 2018-01-28: 5 [IU] via SUBCUTANEOUS
  Filled 2018-01-28: qty 10

## 2018-01-28 MED ORDER — INSULIN ASPART 100 UNIT/ML ~~LOC~~ SOLN
0.0000 [IU] | Freq: Three times a day (TID) | SUBCUTANEOUS | Status: DC
  Administered 2018-01-28: 2 [IU] via SUBCUTANEOUS
  Administered 2018-01-28: 3 [IU] via SUBCUTANEOUS

## 2018-01-28 MED ORDER — INSULIN ASPART PROT & ASPART (70-30 MIX) 100 UNIT/ML ~~LOC~~ SUSP: SUBCUTANEOUS | 11 refills | Status: DC

## 2018-01-28 NOTE — Discharge Summary (Addendum)
Discharge Summary  [name] ZOX:[id number] DOB: Sep 03, 1955  PCP: Lanae Boast, FNP  Admit date: 01/27/2018 Discharge date: 01/28/2018  Time spent: 64mins, more than 50% time spent on coordination of care.  Recommendations for Outpatient Follow-up:  1. F/u with PMD within a week  for hospital discharge follow up, repeat cbc/bmp at follow up 2. F/u with cardiology on 10/3 for chronic afib and heart failure/pulmonary hypertension 3. F/u with nephrology Dr Clover Mealy  Discharge Diagnoses:  Active Hospital Problems   Diagnosis Date Noted  . Hypoglycemia 01/27/2018  . CHF (congestive heart failure) ([place]) 01/28/2018  . Pulmonary hypertension ([place]) 01/27/2018  . Hepatitis C antibody test positive 01/27/2018  . Acute CHF (congestive heart failure) ([place]) 09/23/2017  . CKD (chronic kidney disease) stage 3, GFR 30-59 ml/min (H[facility]) 09/23/2017  . Chronic venous insufficiency 09/21/2017  . Chronic diastolic CHF (congestive heart failure) ([place]) 11/03/2016  . Hypokalemia 11/03/2016  . History of renal transplant 10/16/2013  . Atrial fibrillation ([place]) 10/16/2013  . DM (diabetes mellitus), type 2, uncontrolled ([place]) 04/11/2011  . HTN (hypertension) 04/11/2011    Resolved Hospital Problems  No resolved problems to display.    Discharge Condition: stable  Diet recommendation: heart healthy/carb modified  Filed Weights   01/27/18 1540  Weight: 101 kg    History of present illness: (per admitting MD Dr Si Raider) PCP: Lanae Boast, [place]  Patient coming from: imaging department via ems   Chief Complaint: hypoglycemia  HPI: [name] is a 62 y.o. male with medical history significant for esrd s/p renal transplant, well-controlled t2dm, htn, chronic pain on methadone, chronic a-fib not anticoagulated, phtn, dchf, hcv antibody positive, who presents for above.  Has had several months of dyspnea on exertion. Was seen by pulmonology earlier today, PFTs ordered. Patient was leaving  PFTs when he felt lightheaded and slowly went to the ground with assistance. He doesn't remember this but says he was later told no fall, no injury. Speech was slurred. EMS called, glucose 37. d50 was given with improvement to 50s and mental status improved. Patient says he does not think he gave more than normal dose of AM insulin. Occasionally uses albuterol, last was one puff last night. Says has had normal diet recently, ate this morning.  Says for several months has experienced dyspnea with exertion. No cough or fevers, no hemoptysis. Has chronic leg swelling that has been followed by vascular surgery. Says he is not anticoagulated because his nephrologist stopped it.   ED Course: d50, d51/2ns, labs   Hospital Course:  Principal Problem:   Hypoglycemia Active Problems:   DM (diabetes mellitus), type 2, uncontrolled (HCC)   HTN (hypertension)   History of renal transplant   Atrial fibrillation (HCC)   Chronic diastolic CHF (congestive heart failure) (HCC)   Hypokalemia   Chronic venous insufficiency   CKD (chronic kidney disease) stage 3, GFR 30-59 ml/min (HCC)   Acute CHF (congestive heart failure) (HCC)   Pulmonary hypertension (HCC)   Hepatitis C antibody test positive   CHF (congestive heart failure) (HCC)  Symptomatic hypoglycemia  -happened at pulmonary clinic due to missing lunch -he received  d50 x1 in the ED, he received gentle hydration -resolved  Insulin dependent DM2 -has been on insulin for more than 102yrs -a1c 6.9 -he reports weight loss since May after he was treated for pneumonia -home insulin dose reduced, he is instructed to close monitor blood glucose and close follow up with pcp.  Hypokalemia: Replace   Chronic AFib -he  is in afib/ rate controlled on coreg -he reports he used to be on eliquis, he was taken off eliquis after renal transplant. -he is advised to follow up with cardiology/nephrology to discuss anticoagulation -he denies history of  cva.  Chronic Diastolic CHF/pulmopnary hypertension: -Lung clear, he dose has chronic bilateral lower extremity edema -he is continued on home dose lasix -he is instructed to perform daily weight -he is to follow up with cardiology on 10/3 -he needs outpatient sleep study as well  Chronic bilateral lower extremity edema -he reports recently evaluated by vascular surgery Dr Bridgett Larsson who in the process of getting him on bilateral compression    CKD III/s/p kidney transplant Cr at baseline, continue home meds F/u with nephrology Dr Clover Mealy  HTN/HLD - cont home coreg, clonidine, lasix, nifedipine, pravastatin  chronic pain - cont home methadone  H/o HCV antibody positive in 2010 Unknown detail  hcv  RNA in process, pcp to follow up on result F/u with pcp  Procedures:  none  Consultations:  none  Discharge Exam: BP (!) 159/82 (BP Location: Right Arm)   Pulse 79   Temp 98.5 F (36.9 C) (Oral)   Resp 18   Ht 5\' 9"  (1.753 m) Comment: Per patient, he is 5'9"  Wt 101 kg   SpO2 96%   BMI 32.88 kg/m   General: NAD Cardiovascular: IRRR Respiratory: CTABL Extremities: chronic bilateral pitting edema  Discharge Instructions You were cared for by a hospitalist during your hospital stay. If you have any questions about your discharge medications or the care you received while you were in the hospital after you are discharged, you can call the unit and asked to speak with the hospitalist on call if the hospitalist that took care of you is not available. Once you are discharged, your primary care physician will handle any further medical issues. Please note that NO REFILLS for any discharge medications will be authorized once you are discharged, as it is imperative that you return to your primary care physician (or establish a relationship with a primary care physician if you do not have one) for your aftercare needs so that they can reassess your need for medications and monitor  your lab values.  Discharge Instructions    Diet - low sodium heart healthy   Complete by:  As directed    Carb modified diet   Increase activity slowly   Complete by:  As directed      Allergies as of 01/28/2018      Reactions   Tramadol Hives, Itching, Rash      Medication List    TAKE these medications   aspirin EC 81 MG tablet Take 81 mg by mouth daily.   carvedilol 25 MG tablet Commonly known as:  COREG Take 1 tablet (25 mg total) by mouth 2 (two) times daily with a meal.   cloNIDine 0.3 MG tablet Commonly known as:  CATAPRES Take 0.15 mg by mouth 2 (two) times daily.   furosemide 20 MG tablet Commonly known as:  LASIX Take 2 tablets (40 mg total) by mouth 2 (two) times daily.   insulin aspart protamine- aspart (70-30) 100 UNIT/ML injection Commonly known as:  NOVOLOG MIX 70/30 Injects 30 units every morning and injects 20 units every evening (follow up with your PCP for adjustments) What changed:  additional instructions   magnesium oxide 400 MG tablet Commonly known as:  MAG-OX Take 400 mg by mouth daily.   methadone 10 MG/5ML solution Commonly known  as:  DOLOPHINE Take 42.5 mLs (85 mg total) by mouth daily. Goes to a clinic every day (please [facility] with [facility] for adjustments to your dose) What changed:    how much to take  additional instructions   mycophenolate 180 MG EC tablet Commonly known as:  MYFORTIC Take 540 mg by mouth 2 (two) times daily.   NIFEdipine 60 MG 24 hr tablet Commonly known as:  PROCARDIA XL/ADALAT-CC Take 60 mg by mouth daily.   omeprazole 40 MG capsule Commonly known as:  PRILOSEC Take 40 mg by mouth daily before breakfast.   POLY-IRON 150 150 MG capsule Generic drug:  iron polysaccharides Take 150 mg by mouth daily.   pravastatin 40 MG tablet Commonly known as:  PRAVACHOL Take 40 mg by mouth daily.   pregabalin 100 MG capsule Commonly known as:  LYRICA Take 100 mg by mouth 3 (three) times daily.    Spacer/Aero Chamber Mouthpiece Misc 1 each by Does not apply route every 6 (six) hours as needed.   sulfamethoxazole-trimethoprim 400-80 MG tablet Commonly known as:  BACTRIM,SEPTRA Take 1 tablet by mouth every Monday, Wednesday, and Friday.   tacrolimus 1 MG capsule Commonly known as:  PROGRAF Take 1-2 mg by mouth See admin instructions. Take 2 mg by mouth in the morning and 1 mg in the evening   VENTOLIN HFA 108 (90 Base) MCG/ACT inhaler Generic drug:  albuterol INHALE 2 PUFFS INTO THE LUNGS EVERY 6 HOURS AS NEEDED FOR WHEEZING OR SHORTNESS OF BREATH What changed:  See the new instructions.   albuterol (2.5 MG/3ML) 0.083% nebulizer solution Commonly known as:  PROVENTIL Take 3 mLs (2.5 mg total) by nebulization every 6 (six) hours as needed for wheezing or shortness of breath. What changed:  Another medication with the same name was changed. Make sure you understand how and when to take each.      Allergies  Allergen Reactions  . Tramadol Hives, Itching and Rash   Follow-up Information    Lanae Boast, FNP Follow up.   Specialty:  Family Medicine Contact information: [place] [address] [phone]        Buford Dresser, MD .   Specialty:  Cardiology Contact information: [address] [place] [address] [phone]        Corliss Parish, MD Follow up.   Specialty:  Nephrology Contact information: [place] [address] [phone]            The results of significant diagnostics from this hospitalization (including imaging, microbiology, ancillary and laboratory) are listed below for reference.    Significant Diagnostic Studies: Dg Chest 2 View  Result Date: 01/27/2018 CLINICAL DATA:  CHF with shortness of breath. EXAM: CHEST - 2 VIEW COMPARISON:  01/04/2018 FINDINGS: 1725 hours. The cardio pericardial silhouette is enlarged. There is pulmonary vascular congestion with underlying  interstitial pulmonary edema. No substantial pleural effusion. The visualized bony structures of the thorax are intact. Telemetry leads overlie the chest. IMPRESSION: Cardiomegaly with vascular congestion and interstitial pulmonary edema. Electronically Signed   By: Misty Stanley M.D.   On: 01/27/2018 17:41   Dg Chest 2 View  Result Date: 01/04/2018 CLINICAL DATA:  Pulmonary edema EXAM: CHEST - 2 VIEW COMPARISON:  01/03/2018 FINDINGS: Cardiac shadow is enlarged with stable aortic calcifications. Lungs are well aerated bilaterally. Previously seen pulmonary vascular congestion and edema has nearly completely resolved. No focal infiltrate or effusion is seen. No bony abnormality is noted.  IMPRESSION: Near complete resolution vascular congestion and edema. Electronically Signed   By: Inez Catalina M.D.   On: 01/04/2018 12:36   Ct Head Wo Contrast  Result Date: 01/02/2018 CLINICAL DATA:  Altered mental status, pinpoint pupils, combative EXAM: CT HEAD WITHOUT CONTRAST TECHNIQUE: Contiguous axial images were obtained from the base of the skull through the vertex without intravenous contrast. COMPARISON:  04/06/2014 FINDINGS: Brain: Patchy areas of hypoattenuation in deep white matter right greater than left, stable. Negative for acute intracranial hemorrhage, mass lesion, acute infarction, midline shift, or mass-effect. Acute infarct may be inapparent on noncontrast CT. Ventricles and sulci symmetric. Vascular: Atherosclerotic and physiologic intracranial calcifications. Skull: Normal. Negative for fracture or focal lesion. Sinuses/Orbits: Stable retention cyst or polyp in the left maxillary sinus. Chronic opacification left frontal sinus and left ethmoid air cells. Other: None IMPRESSION: 1. Chronic nonspecific areas of white matter hypoattenuation. No acute findings. 2. Chronic paranasal sinus disease as above. Electronically Signed   By: Lucrezia Europe M.D.   On: 01/02/2018 15:28   Dg Chest Port 1 View  Result  Date: 01/03/2018 CLINICAL DATA:  Pulmonary edema by history, follow-up EXAM: PORTABLE CHEST 1 VIEW COMPARISON:  Portable chest x-ray of 01/02/2017 FINDINGS: There is little change in moderate cardiomegaly and very mild pulmonary vascular congestion, possibly improved somewhat. No pleural effusion is seen. Mediastinal and hilar contours are unremarkable. No bony abnormality is seen. IMPRESSION: Little change to slight improvement in mild pulmonary vascular congestion. Stable cardiomegaly. Electronically Signed   By: Ivar Drape M.D.   On: 01/03/2018 09:07   Dg Chest Portable 1 View  Result Date: 01/02/2018 CLINICAL DATA:  Altered mental status EXAM: PORTABLE CHEST 1 VIEW COMPARISON:  10/25/2017 FINDINGS: Cardiac enlargement with vascular congestion and mild interstitial edema similar to the prior study. Negative for pleural effusion. No focal consolidation IMPRESSION: Cardiac enlargement with vascular congestion and diffuse interstitial edema similar to prior studies compatible with chronic congestive heart failure. Electronically Signed   By: Franchot Gallo M.D.   On: 01/02/2018 13:22    Microbiology: No results found for this or any previous visit (from the past 240 hour(s)).   Labs: Basic Metabolic Panel: Recent Labs  Lab 01/25/18 1441 01/27/18 1616 01/27/18 2301 01/28/18 0645  NA 139 140 138 137  K 4.0 2.9* 4.7 5.2*  CL 99 104 102 105  CO2 23 25 23 24   GLUCOSE 124* 56* 242* 227*  BUN 20 21 21 19   CREATININE 1.46* 1.59* 1.49* 1.44*  CALCIUM 9.4 9.6 9.9 9.1  MG  --  1.9  --  1.7   Liver Function Tests: Recent Labs  Lab 01/25/18 1441 01/27/18 1616  AST 13 22  ALT 9 11  ALKPHOS 75 65  BILITOT 0.5 1.0  PROT 6.8 7.0  ALBUMIN 4.2 3.7   No results for input(s): LIPASE, AMYLASE in the last 168 hours. No results for input(s): AMMONIA in the last 168 hours. CBC: Recent Labs  Lab 01/25/18 1441 01/27/18 1616 01/27/18 2301  WBC 6.0 8.1 6.3  NEUTROABS 4.3 6.3  --   HGB 12.8*  12.8* 13.2  HCT 40.5 43.0 44.4  MCV 77* 82.4 81.8  PLT 210 241 248   Cardiac Enzymes: No results for input(s): CKTOTAL, CKMB, CKMBINDEX, TROPONINI in the last 168 hours. BNP: BNP (last 3 results) Recent Labs    09/23/17 1832 01/02/18 1938 01/27/18 1616  BNP 374.4* 420.3* 376.4*    ProBNP (last 3 results) Recent Labs    11/16/17 1605  PROBNP 1,859*    CBG: Recent Labs  Lab 01/27/18 1752 01/27/18 2137 01/27/18 2259 01/28/18 0812 01/28/18 1206  GLUCAP 158* 197* 226* 212* 193*       Signed:  Florencia Reasons MD, PhD  [facility] 01/28/2018, 12:29 PM

## 2018-01-28 NOTE — Care Management Obs Status (Signed)
[facility] NOTIFICATION   Patient Details  Name: [name] MRN: [id number] Date of Birth: 12-02-1955   Medicare Observation Status Notification Given:  Yes    Carles Collet, RN 01/28/2018, 12:37 PM

## 2018-01-28 NOTE — Care Management Note (Signed)
Case Management Note  Patient Details  Name: [name] MRN: [id number] Date of Birth: [birth year]  Subjective/Objective:                    Action/Plan:  Spoke w patient and S.O. In room. They state that are having problems getting insulin strips approved. They state they need a "CMN" form to be filled out by his doctor. CM informed them they will need to follow up w PCP for this. They verified they are in contact with [name], they state they like seeing her and do not want to change provider.  Currently patient is using Rel[employer] meter. Suggested that he continue to use this meter and strips as they are the most cost effective option on the market.  Both were very appreciative for help and time spent.  No further CM needs at this time.   Expected Discharge Date:  01/28/18               Expected Discharge Plan:     In-House Referral:     Discharge planning Services  CM Consult  Post Acute Care Choice:    Choice offered to:     DME Arranged:    DME Agency:     HH Arranged:    HH Agency:     Status of Service:  Completed, signed off  If discussed at [employer] of Stay Meetings, dates discussed:    Additional Comments:  Carles Collet, RN 01/28/2018, 12:45 PM

## 2018-01-28 NOTE — Progress Notes (Signed)
Patient discharged to home with wife. After visit Summary reviewed. Patient capable of reverbalizing medications and follow up visits. No signs and symptoms of distress noted. Patient educated to return to the ED in the case of an emergency. Dillon Bjork RN

## 2018-01-28 NOTE — Care Management CC44 (Signed)
Condition Code 44 Documentation Completed  Patient Details  Name: [name] MRN: [id number] Date of Birth: Aug 19, 1955   Condition Code 44 given:  Yes Patient signature on Condition Code 44 notice:  Yes Documentation of 2 MD's agreement:  Yes Code 44 added to claim:  Yes    Carles Collet, RN 01/28/2018, 12:44 PM

## 2018-01-29 LAB — HCV RNA QUANT RFLX ULTRA OR GENOTYP
HCV RNA Qnt(log copy/mL): UNDETERMINED log10 IU/mL
HepC Qn: NOT DETECTED IU/mL

## 2018-01-30 LAB — POCT I-STAT, CHEM 8
BUN: 25 mg/dL — ABNORMAL HIGH (ref 8–23)
Calcium, Ion: 1.19 mmol/L (ref 1.15–1.40)
Chloride: 102 mmol/L (ref 98–111)
Creatinine, Ser: 1.6 mg/dL — ABNORMAL HIGH (ref 0.61–1.24)
Glucose, Bld: 55 mg/dL — ABNORMAL LOW (ref 70–99)
HCT: 44 % (ref 39.0–52.0)
Hemoglobin: 15 g/dL (ref 13.0–17.0)
Potassium: 2.8 mmol/L — ABNORMAL LOW (ref 3.5–5.1)
Sodium: 141 mmol/L (ref 135–145)
TCO2: 28 mmol/L (ref 22–32)

## 2018-02-09 ENCOUNTER — Ambulatory Visit (INDEPENDENT_AMBULATORY_CARE_PROVIDER_SITE_OTHER): Payer: Medicare Other | Admitting: Cardiology

## 2018-02-09 ENCOUNTER — Encounter: Payer: Self-pay | Admitting: Cardiology

## 2018-02-09 VITALS — BP 132/72 | HR 86 | Ht 69.0 in | Wt 221.6 lb

## 2018-02-09 DIAGNOSIS — I272 Pulmonary hypertension, unspecified: Secondary | ICD-10-CM | POA: Diagnosis not present

## 2018-02-09 DIAGNOSIS — Z7189 Other specified counseling: Secondary | ICD-10-CM | POA: Diagnosis not present

## 2018-02-09 DIAGNOSIS — I4821 Permanent atrial fibrillation: Secondary | ICD-10-CM | POA: Diagnosis not present

## 2018-02-09 DIAGNOSIS — I10 Essential (primary) hypertension: Secondary | ICD-10-CM | POA: Diagnosis not present

## 2018-02-09 DIAGNOSIS — I5032 Chronic diastolic (congestive) heart failure: Secondary | ICD-10-CM | POA: Diagnosis not present

## 2018-02-09 MED ORDER — APIXABAN 5 MG PO TABS: 5.0000 mg | ORAL_TABLET | Freq: Two times a day (BID) | ORAL | 3 refills | Status: DC

## 2018-02-09 NOTE — Patient Instructions (Signed)
Medication Instructions: Your Physician recommend you make the following changes to your medication. Restart: Eliquis 5 mg twice a day    If you need a refill on your cardiac medications before your next appointment, please call your pharmacy.   Labwork: None  Procedures/Testing: None  Follow-Up: Your physician wants you to follow-up in 3 months with Dr. Harrell Gave.  Special Instructions:    Thank you for choosing [facility]!!
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
## 2018-02-17 ENCOUNTER — Other Ambulatory Visit: Payer: Self-pay | Admitting: Family Medicine

## 2018-02-17 DIAGNOSIS — R0609 Other forms of dyspnea: Principal | ICD-10-CM

## 2018-02-17 DIAGNOSIS — R0600 Dyspnea, unspecified: Secondary | ICD-10-CM

## 2018-03-07 ENCOUNTER — Ambulatory Visit (INDEPENDENT_AMBULATORY_CARE_PROVIDER_SITE_OTHER): Payer: Medicare Other | Admitting: Vascular Surgery

## 2018-03-07 ENCOUNTER — Encounter: Payer: Self-pay | Admitting: Vascular Surgery

## 2018-03-07 VITALS — BP 141/78 | HR 78 | Temp 97.8°F | Resp 18 | Ht 70.0 in | Wt 222.0 lb

## 2018-03-07 DIAGNOSIS — I890 Lymphedema, not elsewhere classified: Secondary | ICD-10-CM | POA: Diagnosis not present
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
## 2018-03-17 DIAGNOSIS — E119 Type 2 diabetes mellitus without complications: Secondary | ICD-10-CM | POA: Diagnosis not present

## 2018-03-17 DIAGNOSIS — Z940 Kidney transplant status: Secondary | ICD-10-CM | POA: Diagnosis not present

## 2018-03-17 DIAGNOSIS — E785 Hyperlipidemia, unspecified: Secondary | ICD-10-CM | POA: Diagnosis not present

## 2018-03-19 ENCOUNTER — Other Ambulatory Visit: Payer: Self-pay | Admitting: Family Medicine

## 2018-03-19 DIAGNOSIS — R0600 Dyspnea, unspecified: Secondary | ICD-10-CM

## 2018-03-19 DIAGNOSIS — R0609 Other forms of dyspnea: Principal | ICD-10-CM

## 2018-03-24 DIAGNOSIS — I4892 Unspecified atrial flutter: Secondary | ICD-10-CM | POA: Diagnosis not present

## 2018-03-24 DIAGNOSIS — E1122 Type 2 diabetes mellitus with diabetic chronic kidney disease: Secondary | ICD-10-CM | POA: Diagnosis not present

## 2018-03-24 DIAGNOSIS — Z79899 Other long term (current) drug therapy: Secondary | ICD-10-CM | POA: Diagnosis not present

## 2018-03-24 DIAGNOSIS — E785 Hyperlipidemia, unspecified: Secondary | ICD-10-CM | POA: Diagnosis not present

## 2018-03-24 DIAGNOSIS — M25569 Pain in unspecified knee: Secondary | ICD-10-CM | POA: Diagnosis not present

## 2018-03-24 DIAGNOSIS — I272 Pulmonary hypertension, unspecified: Secondary | ICD-10-CM | POA: Diagnosis not present

## 2018-03-24 DIAGNOSIS — N2889 Other specified disorders of kidney and ureter: Secondary | ICD-10-CM | POA: Diagnosis not present

## 2018-03-24 DIAGNOSIS — G8929 Other chronic pain: Secondary | ICD-10-CM | POA: Diagnosis not present

## 2018-03-24 DIAGNOSIS — N529 Male erectile dysfunction, unspecified: Secondary | ICD-10-CM | POA: Diagnosis not present

## 2018-03-24 DIAGNOSIS — I151 Hypertension secondary to other renal disorders: Secondary | ICD-10-CM | POA: Diagnosis not present

## 2018-03-24 DIAGNOSIS — Z940 Kidney transplant status: Secondary | ICD-10-CM | POA: Diagnosis not present

## 2018-04-09 ENCOUNTER — Other Ambulatory Visit: Payer: Self-pay | Admitting: Family Medicine

## 2018-04-09 DIAGNOSIS — I509 Heart failure, unspecified: Secondary | ICD-10-CM

## 2018-04-10 ENCOUNTER — Other Ambulatory Visit: Payer: Self-pay | Admitting: Family Medicine

## 2018-04-10 DIAGNOSIS — I509 Heart failure, unspecified: Secondary | ICD-10-CM

## 2018-04-25 ENCOUNTER — Telehealth: Payer: Self-pay | Admitting: Pulmonary Disease

## 2018-04-25 NOTE — Telephone Encounter (Signed)
Per our records, pt has been rescheduled to 06/04/17. Nothing further is needed.

## 2018-04-25 NOTE — Telephone Encounter (Signed)
Left detailed message requesting that tomorrow's 11:45a appt be rescheduled to later in the afternoon.  Front staff-if pt calls back to reschedule please hold 11:45 slot, due to Dr. Vaughan Browner having a meeting. Thanks

## 2018-04-26 ENCOUNTER — Ambulatory Visit: Payer: Medicare Other | Admitting: Pulmonary Disease

## 2018-04-26 ENCOUNTER — Encounter: Payer: Self-pay | Admitting: Family Medicine

## 2018-04-26 ENCOUNTER — Ambulatory Visit (INDEPENDENT_AMBULATORY_CARE_PROVIDER_SITE_OTHER): Payer: Medicare Other | Admitting: Family Medicine

## 2018-04-26 VITALS — BP 134/69 | HR 79 | Temp 97.9°F | Resp 18 | Ht 70.0 in | Wt 220.0 lb

## 2018-04-26 DIAGNOSIS — I10 Essential (primary) hypertension: Secondary | ICD-10-CM

## 2018-04-26 DIAGNOSIS — I4821 Permanent atrial fibrillation: Secondary | ICD-10-CM

## 2018-04-26 DIAGNOSIS — I272 Pulmonary hypertension, unspecified: Secondary | ICD-10-CM

## 2018-04-26 DIAGNOSIS — E1165 Type 2 diabetes mellitus with hyperglycemia: Secondary | ICD-10-CM | POA: Diagnosis not present

## 2018-04-26 LAB — POCT URINALYSIS DIPSTICK
Bilirubin, UA: NEGATIVE
Glucose, UA: NEGATIVE
Ketones, UA: NEGATIVE
Nitrite, UA: NEGATIVE
Protein, UA: POSITIVE — AB
Spec Grav, UA: 1.015 (ref 1.010–1.025)
Urobilinogen, UA: 1 E.U./dL
pH, UA: 5 (ref 5.0–8.0)

## 2018-04-26 LAB — POCT GLYCOSYLATED HEMOGLOBIN (HGB A1C): Hemoglobin A1C: 6.4 % — AB (ref 4.0–5.6)

## 2018-04-26 LAB — GLUCOSE, POCT (MANUAL RESULT ENTRY): POC Glucose: 64 mg/dl — AB (ref 70–99)
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
## 2018-04-26 NOTE — Patient Instructions (Signed)
I decreased your insulin to 30 units in the AM and 25 in the PM.    Diabetes Mellitus and Nutrition, Adult When you have diabetes (diabetes mellitus), it is very important to have healthy eating habits because your blood sugar (glucose) levels are greatly affected by what you eat and drink. Eating healthy foods in the appropriate amounts, at about the same times every day, can help you:  Control your blood glucose.  Lower your risk of heart disease.  Improve your blood pressure.  Reach or maintain a healthy weight. Every person with diabetes is different, and each person has different needs for a meal plan. Your health care provider may recommend that you work with a diet and nutrition specialist (dietitian) to make a meal plan that is best for you. Your meal plan may vary depending on factors such as:  The calories you need.  The medicines you take.  Your weight.  Your blood glucose, blood pressure, and cholesterol levels.  Your activity level.  Other health conditions you have, such as heart or kidney disease. How do carbohydrates affect me? Carbohydrates, also called carbs, affect your blood glucose level more than any other type of food. Eating carbs naturally raises the amount of glucose in your blood. Carb counting is a method for keeping track of how many carbs you eat. Counting carbs is important to keep your blood glucose at a healthy level, especially if you use insulin or take certain oral diabetes medicines. It is important to know how many carbs you can safely have in each meal. This is different for every person. Your dietitian can help you calculate how many carbs you should have at each meal and for each snack. Foods that contain carbs include:  Bread, cereal, rice, pasta, and crackers.  Potatoes and corn.  Peas, beans, and lentils.  Milk and yogurt.  Fruit and juice.  Desserts, such as cakes, cookies, ice cream, and candy. How does alcohol affect  me? Alcohol can cause a sudden decrease in blood glucose (hypoglycemia), especially if you use insulin or take certain oral diabetes medicines. Hypoglycemia can be a life-threatening condition. Symptoms of hypoglycemia (sleepiness, dizziness, and confusion) are similar to symptoms of having too much alcohol. If your health care provider says that alcohol is safe for you, follow these guidelines:  Limit alcohol intake to no more than 1 drink per day for nonpregnant women and 2 drinks per day for men. One drink equals 12 oz of beer, 5 oz of wine, or 1 oz of hard liquor.  Do not drink on an empty stomach.  Keep yourself hydrated with water, diet soda, or unsweetened iced tea.  Keep in mind that regular soda, juice, and other mixers may contain a lot of sugar and must be counted as carbs. What are tips for following this plan?  Reading food labels  Start by checking the serving size on the "Nutrition Facts" label of packaged foods and drinks. The amount of calories, carbs, fats, and other nutrients listed on the label is based on one serving of the item. Many items contain more than one serving per package.  Check the total grams (g) of carbs in one serving. You can calculate the number of servings of carbs in one serving by dividing the total carbs by 15. For example, if a food has 30 g of total carbs, it would be equal to 2 servings of carbs.  Check the number of grams (g) of saturated and trans fats in  one serving. Choose foods that have low or no amount of these fats.  Check the number of milligrams (mg) of salt (sodium) in one serving. Most people should limit total sodium intake to less than 2,300 mg per day.  Always check the nutrition information of foods labeled as "low-fat" or "nonfat". These foods may be higher in added sugar or refined carbs and should be avoided.  Talk to your dietitian to identify your daily goals for nutrients listed on the label. Shopping  Avoid buying  canned, premade, or processed foods. These foods tend to be high in fat, sodium, and added sugar.  Shop around the outside edge of the grocery store. This includes fresh fruits and vegetables, bulk grains, fresh meats, and fresh dairy. Cooking  Use low-heat cooking methods, such as baking, instead of high-heat cooking methods like deep frying.  Cook using healthy oils, such as olive, canola, or sunflower oil.  Avoid cooking with butter, cream, or high-fat meats. Meal planning  Eat meals and snacks regularly, preferably at the same times every day. Avoid going long periods of time without eating.  Eat foods high in fiber, such as fresh fruits, vegetables, beans, and whole grains. Talk to your dietitian about how many servings of carbs you can eat at each meal.  Eat 4-6 ounces (oz) of lean protein each day, such as lean meat, chicken, fish, eggs, or tofu. One oz of lean protein is equal to: ? 1 oz of meat, chicken, or fish. ? 1 egg. ?  cup of tofu.  Eat some foods each day that contain healthy fats, such as avocado, nuts, seeds, and fish. Lifestyle  Check your blood glucose regularly.  Exercise regularly as told by your health care provider. This may include: ? 150 minutes of moderate-intensity or vigorous-intensity exercise each week. This could be brisk walking, biking, or water aerobics. ? Stretching and doing strength exercises, such as yoga or weightlifting, at least 2 times a week.  Take medicines as told by your health care provider.  Do not use any products that contain nicotine or tobacco, such as cigarettes and e-cigarettes. If you need help quitting, ask your health care provider.  Work with a Social worker or diabetes educator to identify strategies to manage stress and any emotional and social challenges. Questions to ask a health care provider  Do I need to meet with a diabetes educator?  Do I need to meet with a dietitian?  What number can I call if I have  questions?  When are the best times to check my blood glucose? Where to find more information:  American Diabetes Association: diabetes.org  Academy of Nutrition and Dietetics: www.eatright.[employer] of Diabetes and Digestive and Kidney Diseases (NIH): [employer] Summary  A healthy meal plan will help you control your blood glucose and maintain a healthy lifestyle.  Working with a diet and nutrition specialist (dietitian) can help you make a meal plan that is best for you.  Keep in mind that carbohydrates (carbs) and alcohol have immediate effects on your blood glucose levels. It is important to count carbs and to use alcohol carefully. This information is not intended to replace advice given to you by your health care provider. Make sure you discuss any questions you have with your health care provider. Document Released: 01/21/2005 Document Revised: 11/24/2016 Document Reviewed: 05/31/2016 Elsevier Interactive Patient Education  2019 [employer].

## 2018-04-30 ENCOUNTER — Other Ambulatory Visit: Payer: Self-pay | Admitting: Family Medicine

## 2018-05-16 ENCOUNTER — Ambulatory Visit: Payer: Medicare Other | Admitting: Cardiology

## 2018-05-22 ENCOUNTER — Other Ambulatory Visit: Payer: Self-pay

## 2018-05-22 ENCOUNTER — Emergency Department (HOSPITAL_COMMUNITY): Payer: Medicare Other

## 2018-05-22 ENCOUNTER — Encounter: Payer: Self-pay | Admitting: Cardiology

## 2018-05-22 ENCOUNTER — Inpatient Hospital Stay (HOSPITAL_COMMUNITY)
Admission: EM | Admit: 2018-05-22 | Discharge: 2018-05-26 | DRG: 917 | Disposition: A | Discharge status: Home / Self Care | Payer: Medicare Other | Attending: Internal Medicine | Admitting: Internal Medicine

## 2018-05-22 ENCOUNTER — Encounter (HOSPITAL_COMMUNITY): Payer: Self-pay | Admitting: Internal Medicine

## 2018-05-22 DIAGNOSIS — I4892 Unspecified atrial flutter: Secondary | ICD-10-CM | POA: Diagnosis present

## 2018-05-22 DIAGNOSIS — E860 Dehydration: Secondary | ICD-10-CM | POA: Diagnosis present

## 2018-05-22 DIAGNOSIS — E1165 Type 2 diabetes mellitus with hyperglycemia: Secondary | ICD-10-CM | POA: Diagnosis not present

## 2018-05-22 DIAGNOSIS — E785 Hyperlipidemia, unspecified: Secondary | ICD-10-CM | POA: Diagnosis present

## 2018-05-22 DIAGNOSIS — Z7982 Long term (current) use of aspirin: Secondary | ICD-10-CM

## 2018-05-22 DIAGNOSIS — R0689 Other abnormalities of breathing: Secondary | ICD-10-CM | POA: Diagnosis not present

## 2018-05-22 DIAGNOSIS — Z9049 Acquired absence of other specified parts of digestive tract: Secondary | ICD-10-CM

## 2018-05-22 DIAGNOSIS — E1122 Type 2 diabetes mellitus with diabetic chronic kidney disease: Secondary | ICD-10-CM | POA: Diagnosis present

## 2018-05-22 DIAGNOSIS — R55 Syncope and collapse: Secondary | ICD-10-CM | POA: Diagnosis not present

## 2018-05-22 DIAGNOSIS — I4891 Unspecified atrial fibrillation: Secondary | ICD-10-CM | POA: Diagnosis not present

## 2018-05-22 DIAGNOSIS — E875 Hyperkalemia: Secondary | ICD-10-CM | POA: Diagnosis not present

## 2018-05-22 DIAGNOSIS — N183 Chronic kidney disease, stage 3 unspecified: Secondary | ICD-10-CM | POA: Diagnosis present

## 2018-05-22 DIAGNOSIS — J9600 Acute respiratory failure, unspecified whether with hypoxia or hypercapnia: Secondary | ICD-10-CM | POA: Diagnosis present

## 2018-05-22 DIAGNOSIS — R0902 Hypoxemia: Secondary | ICD-10-CM | POA: Diagnosis not present

## 2018-05-22 DIAGNOSIS — Z794 Long term (current) use of insulin: Secondary | ICD-10-CM

## 2018-05-22 DIAGNOSIS — R52 Pain, unspecified: Secondary | ICD-10-CM | POA: Diagnosis not present

## 2018-05-22 DIAGNOSIS — T403X1A Poisoning by methadone, accidental (unintentional), initial encounter: Secondary | ICD-10-CM | POA: Diagnosis not present

## 2018-05-22 DIAGNOSIS — Z79899 Other long term (current) drug therapy: Secondary | ICD-10-CM

## 2018-05-22 DIAGNOSIS — I130 Hypertensive heart and chronic kidney disease with heart failure and stage 1 through stage 4 chronic kidney disease, or unspecified chronic kidney disease: Secondary | ICD-10-CM | POA: Diagnosis present

## 2018-05-22 DIAGNOSIS — S0990XA Unspecified injury of head, initial encounter: Secondary | ICD-10-CM | POA: Diagnosis not present

## 2018-05-22 DIAGNOSIS — T50904A Poisoning by unspecified drugs, medicaments and biological substances, undetermined, initial encounter: Secondary | ICD-10-CM | POA: Diagnosis not present

## 2018-05-22 DIAGNOSIS — Z8249 Family history of ischemic heart disease and other diseases of the circulatory system: Secondary | ICD-10-CM

## 2018-05-22 DIAGNOSIS — Z973 Presence of spectacles and contact lenses: Secondary | ICD-10-CM

## 2018-05-22 DIAGNOSIS — F1120 Opioid dependence, uncomplicated: Secondary | ICD-10-CM | POA: Diagnosis present

## 2018-05-22 DIAGNOSIS — I872 Venous insufficiency (chronic) (peripheral): Secondary | ICD-10-CM | POA: Diagnosis present

## 2018-05-22 DIAGNOSIS — K219 Gastro-esophageal reflux disease without esophagitis: Secondary | ICD-10-CM | POA: Diagnosis present

## 2018-05-22 DIAGNOSIS — I700 Atherosclerosis of aorta: Secondary | ICD-10-CM | POA: Diagnosis present

## 2018-05-22 DIAGNOSIS — IMO0002 Reserved for concepts with insufficient information to code with codable children: Secondary | ICD-10-CM | POA: Diagnosis present

## 2018-05-22 DIAGNOSIS — Z885 Allergy status to narcotic agent status: Secondary | ICD-10-CM

## 2018-05-22 DIAGNOSIS — T380X5A Adverse effect of glucocorticoids and synthetic analogues, initial encounter: Secondary | ICD-10-CM | POA: Diagnosis not present

## 2018-05-22 DIAGNOSIS — I482 Chronic atrial fibrillation, unspecified: Secondary | ICD-10-CM | POA: Diagnosis present

## 2018-05-22 DIAGNOSIS — Z7901 Long term (current) use of anticoagulants: Secondary | ICD-10-CM

## 2018-05-22 DIAGNOSIS — R05 Cough: Secondary | ICD-10-CM | POA: Diagnosis not present

## 2018-05-22 DIAGNOSIS — Z905 Acquired absence of kidney: Secondary | ICD-10-CM

## 2018-05-22 DIAGNOSIS — T40604A Poisoning by unspecified narcotics, undetermined, initial encounter: Secondary | ICD-10-CM

## 2018-05-22 DIAGNOSIS — I5032 Chronic diastolic (congestive) heart failure: Secondary | ICD-10-CM | POA: Diagnosis present

## 2018-05-22 DIAGNOSIS — R51 Headache: Secondary | ICD-10-CM | POA: Diagnosis not present

## 2018-05-22 DIAGNOSIS — I272 Pulmonary hypertension, unspecified: Secondary | ICD-10-CM | POA: Diagnosis present

## 2018-05-22 DIAGNOSIS — J069 Acute upper respiratory infection, unspecified: Secondary | ICD-10-CM | POA: Diagnosis present

## 2018-05-22 DIAGNOSIS — Z96652 Presence of left artificial knee joint: Secondary | ICD-10-CM | POA: Diagnosis present

## 2018-05-22 DIAGNOSIS — I10 Essential (primary) hypertension: Secondary | ICD-10-CM | POA: Diagnosis present

## 2018-05-22 DIAGNOSIS — J9601 Acute respiratory failure with hypoxia: Secondary | ICD-10-CM | POA: Diagnosis present

## 2018-05-22 DIAGNOSIS — Z940 Kidney transplant status: Secondary | ICD-10-CM

## 2018-05-22 DIAGNOSIS — T403X4A Poisoning by methadone, undetermined, initial encounter: Secondary | ICD-10-CM | POA: Diagnosis not present

## 2018-05-22 HISTORY — DX: Poisoning by methadone, accidental (unintentional), initial encounter: T40.3X1A

## 2018-05-22 HISTORY — DX: Acute respiratory failure with hypoxia: J96.01

## 2018-05-22 LAB — ETHANOL: Alcohol, Ethyl (B): <10 mg/dL (ref <10)

## 2018-05-22 LAB — INFLUENZA PANEL BY PCR (TYPE A & B)
Influenza A By PCR: NEGATIVE
Influenza B By PCR: NEGATIVE

## 2018-05-22 LAB — GLUCOSE, CAPILLARY: Glucose-Capillary: 152 mg/dL — ABNORMAL HIGH (ref 70–99)

## 2018-05-22 LAB — CBG MONITORING, ED: Glucose-Capillary: 147 mg/dL — ABNORMAL HIGH (ref 70–99)

## 2018-05-22 LAB — CBC WITH DIFFERENTIAL/PLATELET
Abs Immature Granulocytes: 0.05 10*3/uL (ref 0.00–0.07)
Basophils Absolute: 0 10*3/uL (ref 0.0–0.1)
Basophils Relative: 0 %
Eosinophils Absolute: 0.4 10*3/uL (ref 0.0–0.5)
Eosinophils Relative: 4 %
HCT: 42.6 % (ref 39.0–52.0)
Hemoglobin: 13.2 g/dL (ref 13.0–17.0)
Immature Granulocytes: 1 %
Lymphocytes Relative: 8 %
Lymphs Abs: 0.7 10*3/uL (ref 0.7–4.0)
MCH: 25.6 pg — ABNORMAL LOW (ref 26.0–34.0)
MCHC: 31 g/dL (ref 30.0–36.0)
MCV: 82.7 fL (ref 80.0–100.0)
Monocytes Absolute: 0.8 10*3/uL (ref 0.1–1.0)
Monocytes Relative: 9 %
Neutro Abs: 7.3 10*3/uL (ref 1.7–7.7)
Neutrophils Relative %: 78 %
Platelets: 216 10*3/uL (ref 150–400)
RBC: 5.15 MIL/uL (ref 4.22–5.81)
RDW: 15.5 % (ref 11.5–15.5)
WBC: 9.4 10*3/uL (ref 4.0–10.5)
nRBC: 0 % (ref 0.0–0.2)

## 2018-05-22 LAB — ACETAMINOPHEN LEVEL: Acetaminophen (Tylenol), Serum: <10 ug/mL — ABNORMAL LOW (ref 10–30)

## 2018-05-22 LAB — COMPREHENSIVE METABOLIC PANEL
ALT: 14 U/L (ref 0–44)
AST: 24 U/L (ref 15–41)
Albumin: 3.2 g/dL — ABNORMAL LOW (ref 3.5–5.0)
Alkaline Phosphatase: 53 U/L (ref 38–126)
Anion gap: 10 (ref 5–15)
BUN: 21 mg/dL (ref 8–23)
CO2: 22 mmol/L (ref 22–32)
Calcium: 8.5 mg/dL — ABNORMAL LOW (ref 8.9–10.3)
Chloride: 99 mmol/L (ref 98–111)
Creatinine, Ser: 1.92 mg/dL — ABNORMAL HIGH (ref 0.61–1.24)
GFR calc Af Amer: 42 mL/min — ABNORMAL LOW (ref >60)
GFR calc non Af Amer: 36 mL/min — ABNORMAL LOW (ref >60)
Glucose, Bld: 236 mg/dL — ABNORMAL HIGH (ref 70–99)
Potassium: 4.1 mmol/L (ref 3.5–5.1)
Sodium: 131 mmol/L — ABNORMAL LOW (ref 135–145)
Total Bilirubin: 0.8 mg/dL (ref 0.3–1.2)
Total Protein: 6.5 g/dL (ref 6.5–8.1)

## 2018-05-22 LAB — RAPID URINE DRUG SCREEN, HOSP PERFORMED
Amphetamines: NOT DETECTED
Barbiturates: NOT DETECTED
Benzodiazepines: NOT DETECTED
Cocaine: NOT DETECTED
Opiates: NOT DETECTED
Tetrahydrocannabinol: NOT DETECTED

## 2018-05-22 LAB — LIPASE, BLOOD: Lipase: 18 U/L (ref 11–51)

## 2018-05-22 LAB — AMMONIA: Ammonia: 34 umol/L (ref 9–35)

## 2018-05-22 LAB — SALICYLATE LEVEL: Salicylate Lvl: <7 mg/dL (ref 2.8–30.0)

## 2018-05-22 MED ORDER — SODIUM CHLORIDE 0.9 % IV SOLN: 250.0000 mL | INTRAVENOUS | Status: DC | PRN

## 2018-05-22 MED ORDER — ACETAMINOPHEN 650 MG RE SUPP: 650.0000 mg | Freq: Four times a day (QID) | RECTAL | Status: DC | PRN

## 2018-05-22 MED ORDER — MAGNESIUM OXIDE 400 (241.3 MG) MG PO TABS
400.0000 mg | ORAL_TABLET | Freq: Every day | ORAL | Status: DC
  Administered 2018-05-23 – 2018-05-26 (×4): 400 mg via ORAL
  Filled 2018-05-22 (×4): qty 1

## 2018-05-22 MED ORDER — PRAVASTATIN SODIUM 40 MG PO TABS
40.0000 mg | ORAL_TABLET | ORAL | Status: DC
  Administered 2018-05-24 – 2018-05-26 (×2): 40 mg via ORAL
  Filled 2018-05-22 (×2): qty 1

## 2018-05-22 MED ORDER — ASPIRIN EC 81 MG PO TBEC
81.0000 mg | DELAYED_RELEASE_TABLET | Freq: Every day | ORAL | Status: DC
  Administered 2018-05-23 – 2018-05-26 (×4): 81 mg via ORAL
  Filled 2018-05-22 (×4): qty 1

## 2018-05-22 MED ORDER — CARVEDILOL 25 MG PO TABS
25.0000 mg | ORAL_TABLET | Freq: Two times a day (BID) | ORAL | Status: DC
  Administered 2018-05-23 – 2018-05-25 (×6): 25 mg via ORAL
  Filled 2018-05-22 (×7): qty 1

## 2018-05-22 MED ORDER — NALOXONE HCL 0.4 MG/ML IJ SOLN
0.2000 mg | INTRAMUSCULAR | Status: DC | PRN
  Administered 2018-05-22: 0.2 mg via INTRAVENOUS
  Filled 2018-05-22 (×2): qty 1

## 2018-05-22 MED ORDER — ACETAMINOPHEN 325 MG PO TABS: 650.0000 mg | ORAL_TABLET | Freq: Four times a day (QID) | ORAL | Status: DC | PRN

## 2018-05-22 MED ORDER — ENOXAPARIN SODIUM 40 MG/0.4ML ~~LOC~~ SOLN: 40.0000 mg | SUBCUTANEOUS | Status: DC

## 2018-05-22 MED ORDER — PANTOPRAZOLE SODIUM 40 MG PO TBEC
40.0000 mg | DELAYED_RELEASE_TABLET | Freq: Every day | ORAL | Status: DC
  Administered 2018-05-23 – 2018-05-26 (×4): 40 mg via ORAL
  Filled 2018-05-22 (×4): qty 1

## 2018-05-22 MED ORDER — ONDANSETRON HCL 4 MG/2ML IJ SOLN
4.0000 mg | Freq: Four times a day (QID) | INTRAMUSCULAR | Status: DC | PRN
  Administered 2018-05-22: 4 mg via INTRAVENOUS

## 2018-05-22 MED ORDER — TACROLIMUS 1 MG PO CAPS
2.0000 mg | ORAL_CAPSULE | Freq: Every day | ORAL | Status: DC
  Administered 2018-05-22 – 2018-05-26 (×5): 2 mg via ORAL
  Filled 2018-05-22 (×5): qty 2

## 2018-05-22 MED ORDER — SODIUM CHLORIDE 0.9% FLUSH
3.0000 mL | Freq: Two times a day (BID) | INTRAVENOUS | Status: DC
  Administered 2018-05-22 – 2018-05-26 (×8): 3 mL via INTRAVENOUS

## 2018-05-22 MED ORDER — MYCOPHENOLATE SODIUM 180 MG PO TBEC
540.0000 mg | DELAYED_RELEASE_TABLET | Freq: Two times a day (BID) | ORAL | Status: DC
  Administered 2018-05-22 – 2018-05-26 (×8): 540 mg via ORAL
  Filled 2018-05-22 (×8): qty 3

## 2018-05-22 MED ORDER — SODIUM CHLORIDE 0.9 % IV BOLUS
500.0000 mL | Freq: Once | INTRAVENOUS | Status: AC
  Administered 2018-05-22: 500 mL via INTRAVENOUS

## 2018-05-22 MED ORDER — NALOXONE HCL 2 MG/2ML IJ SOSY
1.0000 mg | PREFILLED_SYRINGE | Freq: Once | INTRAMUSCULAR | Status: AC
  Administered 2018-05-22: 0.2 mg via INTRAVENOUS
  Filled 2018-05-22: qty 2

## 2018-05-22 MED ORDER — INSULIN ASPART PROT & ASPART (70-30 MIX) 100 UNIT/ML ~~LOC~~ SUSP
30.0000 [IU] | Freq: Every day | SUBCUTANEOUS | Status: DC
  Administered 2018-05-23 – 2018-05-24 (×2): 30 [IU] via SUBCUTANEOUS
  Filled 2018-05-22 (×2): qty 10

## 2018-05-22 MED ORDER — TACROLIMUS 1 MG PO CAPS
1.0000 mg | ORAL_CAPSULE | Freq: Every day | ORAL | Status: DC
  Administered 2018-05-22 – 2018-05-25 (×4): 1 mg via ORAL
  Filled 2018-05-22 (×4): qty 1

## 2018-05-22 MED ORDER — PREDNISONE 20 MG PO TABS
40.0000 mg | ORAL_TABLET | Freq: Every day | ORAL | Status: DC
  Administered 2018-05-23: 40 mg via ORAL
  Filled 2018-05-22: qty 2

## 2018-05-22 MED ORDER — SODIUM CHLORIDE 0.9% FLUSH: 3.0000 mL | INTRAVENOUS | Status: DC | PRN

## 2018-05-22 MED ORDER — ONDANSETRON HCL 4 MG PO TABS: 4.0000 mg | ORAL_TABLET | Freq: Four times a day (QID) | ORAL | Status: DC | PRN

## 2018-05-22 MED ORDER — CLONIDINE HCL 0.3 MG PO TABS
0.3000 mg | ORAL_TABLET | Freq: Three times a day (TID) | ORAL | Status: DC
  Administered 2018-05-22 – 2018-05-26 (×11): 0.3 mg via ORAL
  Filled 2018-05-22 (×11): qty 1

## 2018-05-22 MED ORDER — NIFEDIPINE ER OSMOTIC RELEASE 60 MG PO TB24
60.0000 mg | ORAL_TABLET | Freq: Every day | ORAL | Status: DC
  Administered 2018-05-22 – 2018-05-26 (×5): 60 mg via ORAL
  Filled 2018-05-22 (×5): qty 1

## 2018-05-22 MED ORDER — ONDANSETRON HCL 4 MG/2ML IJ SOLN
4.0000 mg | Freq: Once | INTRAMUSCULAR | Status: DC
  Filled 2018-05-22: qty 2

## 2018-05-22 MED ORDER — IPRATROPIUM-ALBUTEROL 0.5-2.5 (3) MG/3ML IN SOLN
3.0000 mL | Freq: Four times a day (QID) | RESPIRATORY_TRACT | Status: DC
  Administered 2018-05-23 (×2): 3 mL via RESPIRATORY_TRACT
  Filled 2018-05-22 (×3): qty 3

## 2018-05-22 MED ORDER — INSULIN ASPART PROT & ASPART (70-30 MIX) 100 UNIT/ML ~~LOC~~ SUSP
20.0000 [IU] | Freq: Every day | SUBCUTANEOUS | Status: DC
  Administered 2018-05-23: 20 [IU] via SUBCUTANEOUS

## 2018-05-22 MED ORDER — APIXABAN 5 MG PO TABS
5.0000 mg | ORAL_TABLET | Freq: Two times a day (BID) | ORAL | Status: DC
  Administered 2018-05-22 – 2018-05-26 (×8): 5 mg via ORAL
  Filled 2018-05-22 (×8): qty 1

## 2018-05-22 MED ORDER — ALBUTEROL SULFATE (2.5 MG/3ML) 0.083% IN NEBU
2.5000 mg | INHALATION_SOLUTION | Freq: Four times a day (QID) | RESPIRATORY_TRACT | Status: DC | PRN
  Administered 2018-05-22: 2.5 mg via RESPIRATORY_TRACT
  Filled 2018-05-22: qty 3

## 2018-05-22 MED ORDER — METHYLPREDNISOLONE SODIUM SUCC 40 MG IJ SOLR
40.0000 mg | Freq: Once | INTRAMUSCULAR | Status: DC
  Filled 2018-05-22: qty 1
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
## 2018-05-22 NOTE — ED Notes (Signed)
Patient transported to X-ray 

## 2018-05-22 NOTE — ED Notes (Signed)
ED Provider at bedside. 
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
## 2018-05-22 NOTE — ED Notes (Signed)
Teirra-(SR)Dinner Tray Ordered @ 1717-per RN-called by Levada Dy

## 2018-05-23 ENCOUNTER — Encounter (HOSPITAL_COMMUNITY): Payer: Self-pay | Admitting: Internal Medicine

## 2018-05-23 DIAGNOSIS — T403X1A Poisoning by methadone, accidental (unintentional), initial encounter: Secondary | ICD-10-CM | POA: Diagnosis not present

## 2018-05-23 DIAGNOSIS — Z96652 Presence of left artificial knee joint: Secondary | ICD-10-CM | POA: Diagnosis present

## 2018-05-23 DIAGNOSIS — Z973 Presence of spectacles and contact lenses: Secondary | ICD-10-CM | POA: Diagnosis not present

## 2018-05-23 DIAGNOSIS — I10 Essential (primary) hypertension: Secondary | ICD-10-CM | POA: Diagnosis not present

## 2018-05-23 DIAGNOSIS — I130 Hypertensive heart and chronic kidney disease with heart failure and stage 1 through stage 4 chronic kidney disease, or unspecified chronic kidney disease: Secondary | ICD-10-CM | POA: Diagnosis present

## 2018-05-23 DIAGNOSIS — I4821 Permanent atrial fibrillation: Secondary | ICD-10-CM | POA: Diagnosis not present

## 2018-05-23 DIAGNOSIS — K219 Gastro-esophageal reflux disease without esophagitis: Secondary | ICD-10-CM | POA: Diagnosis present

## 2018-05-23 DIAGNOSIS — Z7982 Long term (current) use of aspirin: Secondary | ICD-10-CM | POA: Diagnosis not present

## 2018-05-23 DIAGNOSIS — I700 Atherosclerosis of aorta: Secondary | ICD-10-CM | POA: Diagnosis present

## 2018-05-23 DIAGNOSIS — I5032 Chronic diastolic (congestive) heart failure: Secondary | ICD-10-CM | POA: Diagnosis present

## 2018-05-23 DIAGNOSIS — T50904A Poisoning by unspecified drugs, medicaments and biological substances, undetermined, initial encounter: Secondary | ICD-10-CM | POA: Diagnosis present

## 2018-05-23 DIAGNOSIS — Z7901 Long term (current) use of anticoagulants: Secondary | ICD-10-CM | POA: Diagnosis not present

## 2018-05-23 DIAGNOSIS — I872 Venous insufficiency (chronic) (peripheral): Secondary | ICD-10-CM | POA: Diagnosis present

## 2018-05-23 DIAGNOSIS — Z9049 Acquired absence of other specified parts of digestive tract: Secondary | ICD-10-CM | POA: Diagnosis not present

## 2018-05-23 DIAGNOSIS — Z905 Acquired absence of kidney: Secondary | ICD-10-CM | POA: Diagnosis not present

## 2018-05-23 DIAGNOSIS — J9601 Acute respiratory failure with hypoxia: Secondary | ICD-10-CM | POA: Diagnosis present

## 2018-05-23 DIAGNOSIS — Z940 Kidney transplant status: Secondary | ICD-10-CM | POA: Diagnosis not present

## 2018-05-23 DIAGNOSIS — E1122 Type 2 diabetes mellitus with diabetic chronic kidney disease: Secondary | ICD-10-CM | POA: Diagnosis present

## 2018-05-23 DIAGNOSIS — E785 Hyperlipidemia, unspecified: Secondary | ICD-10-CM | POA: Diagnosis not present

## 2018-05-23 DIAGNOSIS — J069 Acute upper respiratory infection, unspecified: Secondary | ICD-10-CM | POA: Diagnosis present

## 2018-05-23 DIAGNOSIS — I272 Pulmonary hypertension, unspecified: Secondary | ICD-10-CM | POA: Diagnosis present

## 2018-05-23 DIAGNOSIS — N183 Chronic kidney disease, stage 3 (moderate): Secondary | ICD-10-CM | POA: Diagnosis not present

## 2018-05-23 DIAGNOSIS — E860 Dehydration: Secondary | ICD-10-CM | POA: Diagnosis present

## 2018-05-23 DIAGNOSIS — J9600 Acute respiratory failure, unspecified whether with hypoxia or hypercapnia: Secondary | ICD-10-CM | POA: Diagnosis present

## 2018-05-23 DIAGNOSIS — F1120 Opioid dependence, uncomplicated: Secondary | ICD-10-CM | POA: Diagnosis present

## 2018-05-23 DIAGNOSIS — E875 Hyperkalemia: Secondary | ICD-10-CM | POA: Diagnosis not present

## 2018-05-23 DIAGNOSIS — I482 Chronic atrial fibrillation, unspecified: Secondary | ICD-10-CM | POA: Diagnosis present

## 2018-05-23 DIAGNOSIS — E1165 Type 2 diabetes mellitus with hyperglycemia: Secondary | ICD-10-CM | POA: Diagnosis not present

## 2018-05-23 LAB — BASIC METABOLIC PANEL
Anion gap: 9 (ref 5–15)
BUN: 21 mg/dL (ref 8–23)
CO2: 24 mmol/L (ref 22–32)
Calcium: 9.1 mg/dL (ref 8.9–10.3)
Chloride: 103 mmol/L (ref 98–111)
Creatinine, Ser: 1.62 mg/dL — ABNORMAL HIGH (ref 0.61–1.24)
GFR calc Af Amer: 52 mL/min — ABNORMAL LOW (ref >60)
GFR calc non Af Amer: 45 mL/min — ABNORMAL LOW (ref >60)
Glucose, Bld: 111 mg/dL — ABNORMAL HIGH (ref 70–99)
Potassium: 4.6 mmol/L (ref 3.5–5.1)
Sodium: 136 mmol/L (ref 135–145)

## 2018-05-23 LAB — GLUCOSE, CAPILLARY
Glucose-Capillary: 164 mg/dL — ABNORMAL HIGH (ref 70–99)
Glucose-Capillary: 117 mg/dL — ABNORMAL HIGH (ref 70–99)
Glucose-Capillary: 82 mg/dL (ref 70–99)
Glucose-Capillary: 185 mg/dL — ABNORMAL HIGH (ref 70–99)

## 2018-05-23 LAB — BRAIN NATRIURETIC PEPTIDE: B Natriuretic Peptide: 470.9 pg/mL — ABNORMAL HIGH (ref 0.0–100.0)

## 2018-05-23 MED ORDER — FUROSEMIDE 10 MG/ML IJ SOLN
40.0000 mg | Freq: Two times a day (BID) | INTRAMUSCULAR | Status: DC
  Administered 2018-05-23 – 2018-05-25 (×4): 40 mg via INTRAVENOUS
  Filled 2018-05-23 (×4): qty 4

## 2018-05-23 MED ORDER — ALBUTEROL SULFATE (2.5 MG/3ML) 0.083% IN NEBU
2.5000 mg | INHALATION_SOLUTION | RESPIRATORY_TRACT | Status: DC
  Administered 2018-05-23: 2.5 mg via RESPIRATORY_TRACT
  Filled 2018-05-23 (×2): qty 3

## 2018-05-23 MED ORDER — ALBUTEROL SULFATE (2.5 MG/3ML) 0.083% IN NEBU: 2.5000 mg | INHALATION_SOLUTION | RESPIRATORY_TRACT | Status: DC | PRN

## 2018-05-23 NOTE — Care Management Obs Status (Signed)
[facility] NOTIFICATION   Patient Details  Name: [name] MRN: [id number] Date of Birth: Nov 25, 1955   Medicare Observation Status Notification Given:  Yes    Bethena Roys, RN 05/23/2018, 4:13 PM

## 2018-05-23 NOTE — Discharge Instructions (Addendum)

## 2018-05-23 NOTE — Clinical Social Work Note (Signed)
Clinical Social Work Assessment  Patient Details  Name: [name] MRN: [id number] Date of Birth: December 15, 1955  Date of referral:  05/23/18               Reason for consult:  Substance Use/ETOH Abuse                Permission sought to share information with:    Permission granted to share information::     Name::        Agency::     Relationship::     Contact Information:     Housing/Transportation Living arrangements for the past 2 months:  Single Family Home Source of Information:  Patient Patient Interpreter Needed:  None Criminal Activity/Legal Involvement Pertinent to Current Situation/Hospitalization:  No - Comment as needed Significant Relationships:  Spouse Lives with:  Spouse Do you feel safe going back to the place where you live?  Yes Need for family participation in patient care:  No (Coment)  Care giving concerns: CSW consulted for substance use.   Social Worker assessment / plan: CSW consulted for patient substance use, patient in with accidental methadone overdose. Met with patient at bedside and patient's wife present for end of assessment. Patient reported he has been clean since starting methadone several years ago. Patient expressed desire to reduce his methadone dose and eventually wean himself off of the methadone. He indicated the doctors at the methadone clinic rotate in and out and there is no one consistent he feels comfortable to talk to about reducing his doses.  Patient not interested in other treatment or community support, plans to continue at his current methadone clinic. CSW will sign off, please re-consult if needed.  Employment status:    Insurance information:  Medicare PT Recommendations:  Not assessed at this time Information / Referral to community resources:     Patient/Family's Response to care: Patient appreciative of care.  Patient/Family's Understanding of and Emotional Response to Diagnosis, Current Treatment, and Prognosis: Patient  with good understanding of his conditions. Could probably use additional education on methadone dosing and weaning.  Emotional Assessment Appearance:  Appears stated age Attitude/Demeanor/Rapport:  Engaged Affect (typically observed):  Accepting, Calm, Appropriate, Pleasant Orientation:  Oriented to Self, Oriented to Place, Oriented to  Time, Oriented to Situation Alcohol / Substance use:  Other(history of opiate use/ on methadone) Psych involvement (Current and /or in the community):  No (Comment)  Discharge Needs  Concerns to be addressed:  Substance Abuse Concerns Readmission within the last 30 days:  No Current discharge risk:  Substance Abuse Barriers to Discharge:  Continued Medical Work up   Estanislado Emms, LCSW 05/23/2018, 4:26 PM

## 2018-05-23 NOTE — Progress Notes (Addendum)
[facility] PROGRESS NOTE  Randy STROTHER [id number] DOB: 1955/07/28 DOA: 05/22/2018 PCP: Lanae Boast, FNP  Assessment/Plan:  Acute resp failure with hypoxia. Related to accidental methadone overdose in setting of seemingly upper respiratory infection. Oxygen saturation level 88% initially. Weaning oxygen today. Oxygen saturation level 96% on 2L [place]. Has diffuse wheezing and rhonchi on exam.  Neg flu. Chest xray 1/13 reveal Cardiomegaly. No edema or consolidation. There is aortic atherosclerosis. Patient awake alert. Has a hx of diastolic HF and pulmonary htn -continue scheduled neps -continue prednisone -obtain BNP -will provide lasix IV -monitor intake and output -daily weights -monitor oxygen saturation level  URI - CXR  Yesterday as noted above. May be a viral bronchitis. -see #1 - not a smoker and no h/o COPD   Methadone overdose- accidental - he took his usual dose of 115 mg but had not taken any in the prior 2 days and thus had been somnolent all day- in addition, he current respiratory infection may be adding to the hypoxia related to the Methadone - continue to hold methadone - PRN Narcan ordered  Active Problems:    DM (diabetes mellitus), type 2, uncontrolled - on 70/30 insulin. -controlled -monitor    HTN (hypertension)/ A-fib -fair control - Clonidine, Coreg, Nifedipine resumed    Atrial fibrillation (HCC) - see above- resume Eliquis   CKD (chronic kidney disease) stage 3, GFR 30-59 ml/min (HCC)  Renal transplant. Creatinine trending up somewhat -monitor urine output -obtain daily weight - cont Prograf and Mycophenolate   Code Status: full Family Communication: wife at bedside Disposition Plan: home when able   Consultants:    Procedures:    Antibiotics:    HPI/Subjective: Admitted acute respiratory failure with hypoxia related to accidental methadone in setting of ?bronchitis vs copd exacerbation vs acute on chronic HF.  Improving this am  Denies pain/discomfort  Objective: Vitals:   05/23/18 0839 05/23/18 0917  BP: 139/79   Pulse: 82   Resp: (!) 23   Temp:    SpO2: 94% 97%    Intake/Output Summary (Last 24 hours) at 05/23/2018 1220 Last data filed at 05/23/2018 1000 Gross per 24 hour  Intake 240 ml  Output 1325 ml  Net -1085 ml   There were no vitals filed for this visit.  Exam:   General:  Awake alert oriented x3  Cardiovascular: irregularly irregular no mgr trace 1+ LE edema  Respiratory: normal effort BS with fair air movement diffuse rhonchi and wheezing no crackles  Abdomen: soft +BS non tender no guarding or rebounding  Musculoskeletal: joints without swelling/erythema   Data Reviewed: Basic Metabolic Panel: Recent Labs  Lab 05/22/18 0854  NA 131*  K 4.1  CL 99  CO2 22  GLUCOSE 236*  BUN 21  CREATININE 1.92*  CALCIUM 8.5*   Liver Function Tests: Recent Labs  Lab 05/22/18 0854  AST 24  ALT 14  ALKPHOS 53  BILITOT 0.8  PROT 6.5  ALBUMIN 3.2*   Recent Labs  Lab 05/22/18 0854  LIPASE 18   Recent Labs  Lab 05/22/18 1905  AMMONIA 34   CBC: Recent Labs  Lab 05/22/18 0854  WBC 9.4  NEUTROABS 7.3  HGB 13.2  HCT 42.6  MCV 82.7  PLT 216   Cardiac Enzymes: No results for input(s): CKTOTAL, CKMB, CKMBINDEX, TROPONINI in the last 168 hours. BNP (last 3 results) Recent Labs    09/23/17 1832 01/02/18 1938 01/27/18 1616  BNP 374.4* 420.3* 376.4*    ProBNP (last 3  results) Recent Labs    11/16/17 1605  PROBNP 1,859*    CBG: Recent Labs  Lab 05/22/18 1619 05/22/18 2126 05/23/18 0754 05/23/18 1146  GLUCAP 147* 152* 164* 117*    No results found for this or any previous visit (from the past 240 hour(s)).   Studies: Dg Chest 2 View  Result Date: 05/22/2018 CLINICAL DATA:  Syncopal episode. Cough and congestion. EXAM: CHEST - 2 VIEW COMPARISON:  01/27/2018 FINDINGS: Artifact overlies the chest. Chronic cardiac enlargement. Chronic aortic  atherosclerosis. The lungs are clear. No infiltrate, collapse or effusion. No acute bone finding. IMPRESSION: No active disease. Chronic cardiomegaly and aortic atherosclerosis. Electronically Signed   By: Nelson Chimes M.D.   On: 05/22/2018 09:39   Ct Head Wo Contrast  Result Date: 05/22/2018 CLINICAL DATA:  Headache. Fall. Patient anticoagulated. EXAM: CT HEAD WITHOUT CONTRAST TECHNIQUE: Contiguous axial images were obtained from the base of the skull through the vertex without intravenous contrast. COMPARISON:  01/02/2018 FINDINGS: Brain: No evidence of acute or subacute infarction. There chronic small-vessel ischemic changes affecting the pons, basal ganglia region and hemispheric white matter. No large vessel territory infarction. No mass lesion, hemorrhage, hydrocephalus or extra-axial collection. Vascular: There is atherosclerotic calcification of the major vessels at the base of the brain. Skull: No skull fracture. Sinuses/Orbits: Inflammatory changes of the left division of the frontal sinus and ethmoid sinus. Other: None IMPRESSION: No acute or traumatic finding. Chronic small-vessel ischemic changes as outlined above. Electronically Signed   By: Nelson Chimes M.D.   On: 05/22/2018 10:40   Dg Chest Portable 1 View  Result Date: 05/22/2018 CLINICAL DATA:  Cough and hypoxia EXAM: PORTABLE CHEST 1 VIEW COMPARISON:  May 22, 2018 FINDINGS: There is no appreciable edema or consolidation. There is cardiomegaly with pulmonary vascularity normal. No adenopathy. There is aortic atherosclerosis. No bone lesions. IMPRESSION: Cardiomegaly. No edema or consolidation. There is aortic atherosclerosis. Aortic Atherosclerosis (ICD10-I70.0). Electronically Signed   By: Lowella Grip III M.D.   On: 05/22/2018 16:34    Scheduled Meds: . albuterol  2.5 mg Nebulization Q4H  . apixaban  5 mg Oral BID  . aspirin EC  81 mg Oral Daily  . carvedilol  25 mg Oral BID WC  . cloNIDine  0.3 mg Oral TID  . insulin  aspart protamine- aspart  20 Units Subcutaneous Q supper  . insulin aspart protamine- aspart  30 Units Subcutaneous Q breakfast  . ipratropium-albuterol  3 mL Nebulization Q6H  . magnesium oxide  400 mg Oral Daily  . methylPREDNISolone (SOLU-MEDROL) injection  40 mg Intravenous Once  . mycophenolate  540 mg Oral BID  . NIFEdipine  60 mg Oral Daily  . pantoprazole  40 mg Oral Daily  . pravastatin  40 mg Oral Once per day on Mon Wed Fri  . predniSONE  40 mg Oral Q breakfast  . sodium chloride flush  3 mL Intravenous Q12H  . tacrolimus  1 mg Oral QHS  . tacrolimus  2 mg Oral Daily   Continuous Infusions: . sodium chloride      Principal Problem:   Acute respiratory failure with hypoxia (HCC) Active Problems:   Methadone overdose (HCC)   DM (diabetes mellitus), type 2, uncontrolled (HCC)   HTN (hypertension)   Atrial fibrillation (HCC)   CKD (chronic kidney disease) stage 3, GFR 30-59 ml/min (HCC)   Hyperlipidemia    Time spent: 42 min    [place] NP  [facility]  If 7PM-7AM, please contact  night-coverage at [email or web address], password [facility] 05/23/2018, 12:20 PM  LOS: 0 days

## 2018-05-24 DIAGNOSIS — I4821 Permanent atrial fibrillation: Secondary | ICD-10-CM

## 2018-05-24 DIAGNOSIS — I10 Essential (primary) hypertension: Secondary | ICD-10-CM

## 2018-05-24 DIAGNOSIS — N183 Chronic kidney disease, stage 3 (moderate): Secondary | ICD-10-CM

## 2018-05-24 DIAGNOSIS — E1165 Type 2 diabetes mellitus with hyperglycemia: Secondary | ICD-10-CM

## 2018-05-24 DIAGNOSIS — J9601 Acute respiratory failure with hypoxia: Secondary | ICD-10-CM

## 2018-05-24 DIAGNOSIS — T403X1A Poisoning by methadone, accidental (unintentional), initial encounter: Principal | ICD-10-CM

## 2018-05-24 LAB — BASIC METABOLIC PANEL
Anion gap: 8 (ref 5–15)
BUN: 26 mg/dL — ABNORMAL HIGH (ref 8–23)
CO2: 26 mmol/L (ref 22–32)
Calcium: 9.4 mg/dL (ref 8.9–10.3)
Chloride: 99 mmol/L (ref 98–111)
Creatinine, Ser: 1.69 mg/dL — ABNORMAL HIGH (ref 0.61–1.24)
GFR calc Af Amer: 49 mL/min — ABNORMAL LOW (ref >60)
GFR calc non Af Amer: 43 mL/min — ABNORMAL LOW (ref >60)
Glucose, Bld: 285 mg/dL — ABNORMAL HIGH (ref 70–99)
Potassium: 5.4 mmol/L — ABNORMAL HIGH (ref 3.5–5.1)
Sodium: 133 mmol/L — ABNORMAL LOW (ref 135–145)

## 2018-05-24 LAB — HEMOGLOBIN A1C
Hgb A1c MFr Bld: 7 % — ABNORMAL HIGH (ref 4.8–5.6)
Mean Plasma Glucose: 154.2 mg/dL

## 2018-05-24 LAB — CBC
HCT: 43 % (ref 39.0–52.0)
Hemoglobin: 13 g/dL (ref 13.0–17.0)
MCH: 24.9 pg — ABNORMAL LOW (ref 26.0–34.0)
MCHC: 30.2 g/dL (ref 30.0–36.0)
MCV: 82.4 fL (ref 80.0–100.0)
Platelets: 202 10*3/uL (ref 150–400)
RBC: 5.22 MIL/uL (ref 4.22–5.81)
RDW: 14.8 % (ref 11.5–15.5)
WBC: 2.9 10*3/uL — ABNORMAL LOW (ref 4.0–10.5)
nRBC: 0 % (ref 0.0–0.2)

## 2018-05-24 LAB — GLUCOSE, CAPILLARY
Glucose-Capillary: 294 mg/dL — ABNORMAL HIGH (ref 70–99)
Glucose-Capillary: 273 mg/dL — ABNORMAL HIGH (ref 70–99)
Glucose-Capillary: 290 mg/dL — ABNORMAL HIGH (ref 70–99)
Glucose-Capillary: 305 mg/dL — ABNORMAL HIGH (ref 70–99)

## 2018-05-24 MED ORDER — METHYLPREDNISOLONE SODIUM SUCC 40 MG IJ SOLR
40.0000 mg | Freq: Two times a day (BID) | INTRAMUSCULAR | Status: DC
  Administered 2018-05-24 – 2018-05-25 (×3): 40 mg via INTRAVENOUS
  Filled 2018-05-24 (×3): qty 1

## 2018-05-24 MED ORDER — INSULIN ASPART PROT & ASPART (70-30 MIX) 100 UNIT/ML ~~LOC~~ SUSP
47.0000 [IU] | Freq: Every day | SUBCUTANEOUS | Status: DC
  Administered 2018-05-25 – 2018-05-26 (×2): 47 [IU] via SUBCUTANEOUS
  Filled 2018-05-24: qty 10

## 2018-05-24 MED ORDER — INSULIN ASPART PROT & ASPART (70-30 MIX) 100 UNIT/ML ~~LOC~~ SUSP
27.0000 [IU] | Freq: Every day | SUBCUTANEOUS | Status: DC
  Administered 2018-05-24 – 2018-05-25 (×2): 27 [IU] via SUBCUTANEOUS

## 2018-05-24 NOTE — Plan of Care (Signed)
  Problem: Education: Goal: Knowledge of disease and its progression will improve Outcome: Progressing Goal: Individualized Educational Video(s) Outcome: Progressing   Problem: Health Behavior/Discharge Planning: Goal: Ability to manage health-related needs will improve Outcome: Progressing   Problem: Nutritional: Goal: Ability to make healthy dietary choices will improve Outcome: Progressing

## 2018-05-24 NOTE — Progress Notes (Addendum)
Inpatient Diabetes Program Recommendations  AACE/ADA: New Consensus Statement on Inpatient Glycemic Control (2015)  Target Ranges:  Prepandial:   less than 140 mg/dL      Peak postprandial:   less than 180 mg/dL (1-2 hours)      Critically ill patients:  140 - 180 mg/dL   Lab Results  Component Value Date   GLUCAP 273 (H) 05/24/2018   HGBA1C 7.0 (H) 05/24/2018     Results for HEZAKIAH, CHAMPEAU (MRN [id number]) as of 05/24/2018 13:39  Ref. Range 05/23/2018 07:54 05/23/2018 11:46 05/23/2018 16:32 05/23/2018 21:44 05/24/2018 07:37 05/24/2018 11:49  Glucose-Capillary Latest Ref Range: 70 - 99 mg/dL 164 (H) 117 (H) 82 185 (H) 294 (H) 273 (H)    DM2  Home DM meds: 70/30 45 units am; 25 units pm  Current inpatient DM meds: 70/30 30 units am ; 20 units pm  Solumedrol 40 mg Q 12 hours (started this am)    Patient's blood glucose has been above target today after starting Solumedrol. Recommend adding Novolog correction tid.   MD please consider the following inpatient diabetes recommendation:  Add Novolog sensitive (0-9 units) tid with meals    Thank you.   -- Will follow during hospitalization.--  Jonna Clark RN, MSN Diabetes Coordinator Inpatient Glycemic Control Team Team Pager: [phone] (8am-5pm)

## 2018-05-24 NOTE — Progress Notes (Signed)
[facility] PROGRESS NOTE    Progress Note  [name]  WUX:[id number] DOB: 03/18/1956 DOA: 05/22/2018 PCP: Lanae Boast, FNP     Brief Narrative:   [name] is an 63 y.o. male past medical history of heroin abuse on methadone, renal transplant atrial fibrillation on anticoagulation who presents with a syncopal episode after taking his daily methadone dose this morning.  EMS was called he was given Narcan and he awakened, several seconds after that he became somnolent he was given a another dose of Narcan as he was becoming hypoxic and somnolent.  The patient on admission was awake.  Assessment/Plan:   Acute respiratory failure with hypoxia : Likely due to accidental methadone overdose.  Saturations on admission were initially 88%, he was laced on nasal cannula and his oxygen saturation improved. Had diffuse wheezing and rhonchi on physical exam, his flu was negative. Checks x-ray was unrevealing. Due to his wheezing and desaturations he was started on prednisone, nebulizers treatment. BNP on admission was 470 he was given a single dose of IV Lasix, he is negative about a liter. Creatinine seems to be at baseline we will continue IV Lasix for 1 additional day  URI: Remain afebrile breathing has improved no smoking orCOPD. Patient is still wheezing on physical exam he still feels short of breath. We will change his steroids to IV continue inhalers.  Methadone overdose accidentally: Resolved with Narcan.  DM (diabetes mellitus), type 2, uncontrolled (HCC) Continue 70/30 insulin. Leukosis morning is 285 we will decrease his night dose of 7030. A1c was 7.0.  Chronic atrial fibrillation: Rate control continue Eliquis.  Chronic kidney disease stage III: Status post renal transplant, creatinine at baseline.  Continue immunosuppressive therapy.  Essential  HTN (hypertension): Cont current meds  DVT prophylaxis: lovexno Family Communication:none Disposition  Plan/Barrier to D/C: Home in 1-2 days Code Status:     Code Status Orders  (From admission, onward)         Start     Ordered   05/22/18 1655  Full code  Continuous     05/22/18 1655        Code Status History    Date Active Date Inactive Code Status Order ID Comments User Context   01/27/2018 2241 01/28/2018 1723 Full Code [id number]  Gwynne Edinger, MD Inpatient   01/02/2018 1551 01/05/2018 1207 Full Code [id number]  Arnell Asal, NP ED   09/23/2017 2253 09/28/2017 1317 Full Code [id number]  Gwynne Edinger, MD ED   11/03/2016 2017 11/04/2016 2247 Full Code [id number]  Reubin Milan, MD ED   11/03/2016 2017 11/03/2016 2017 Full Code [id number]  Reubin Milan, MD ED   10/16/2013 1847 10/19/2013 1802 Full Code [id number]  Bonnielee Haff, MD Inpatient   05/10/2011 1722 05/13/2011 1511 Full Code [id number]  Verlee Monte, MD Inpatient   04/12/2011 0458 04/14/2011 2309 Full Code [id number]  Starla Link, RN Inpatient        IV Access:    Peripheral IV   Procedures and diagnostic studies:   Dg Chest 2 View  Result Date: 05/22/2018 CLINICAL DATA:  Syncopal episode. Cough and congestion. EXAM: CHEST - 2 VIEW COMPARISON:  01/27/2018 FINDINGS: Artifact overlies the chest. Chronic cardiac enlargement. Chronic aortic atherosclerosis. The lungs are clear. No infiltrate, collapse or effusion. No acute bone finding. IMPRESSION: No active disease. Chronic cardiomegaly and aortic atherosclerosis. Electronically Signed   By: Nelson Chimes M.D.   On: 05/22/2018 09:39   Ct Head Wo  Contrast  Result Date: 05/22/2018 CLINICAL DATA:  Headache. Fall. Patient anticoagulated. EXAM: CT HEAD WITHOUT CONTRAST TECHNIQUE: Contiguous axial images were obtained from the base of the skull through the vertex without intravenous contrast. COMPARISON:  01/02/2018 FINDINGS: Brain: No evidence of acute or subacute infarction. There chronic small-vessel ischemic changes affecting the pons, basal ganglia  region and hemispheric white matter. No large vessel territory infarction. No mass lesion, hemorrhage, hydrocephalus or extra-axial collection. Vascular: There is atherosclerotic calcification of the major vessels at the base of the brain. Skull: No skull fracture. Sinuses/Orbits: Inflammatory changes of the left division of the frontal sinus and ethmoid sinus. Other: None IMPRESSION: No acute or traumatic finding. Chronic small-vessel ischemic changes as outlined above. Electronically Signed   By: Nelson Chimes M.D.   On: 05/22/2018 10:40   Dg Chest Portable 1 View  Result Date: 05/22/2018 CLINICAL DATA:  Cough and hypoxia EXAM: PORTABLE CHEST 1 VIEW COMPARISON:  May 22, 2018 FINDINGS: There is no appreciable edema or consolidation. There is cardiomegaly with pulmonary vascularity normal. No adenopathy. There is aortic atherosclerosis. No bone lesions. IMPRESSION: Cardiomegaly. No edema or consolidation. There is aortic atherosclerosis. Aortic Atherosclerosis (ICD10-I70.0). Electronically Signed   By: Lowella Grip III M.D.   On: 05/22/2018 16:34     Medical Consultants:    None.  Anti-Infectives:   None  Subjective:    Jakarie J Wadhwa he relates his shortness of breath is not at baseline still feel winded with ambulation.  Objective:    Vitals:   05/23/18 1435 05/23/18 1742 05/23/18 2145 05/24/18 0500  BP: 136/85 138/81 (!) 147/101 (!) 151/89  Pulse:   80 85  Resp:   18 18  Temp: 98.1 F (36.7 C)  98 F (36.7 C) 97.8 F (36.6 C)  TempSrc: Oral  Oral Oral  SpO2:   100% 98%    Intake/Output Summary (Last 24 hours) at 05/24/2018 0759 Last data filed at 05/24/2018 0753 Gross per 24 hour  Intake 240 ml  Output 1250 ml  Net -1010 ml   There were no vitals filed for this visit.  Exam: General exam: In no acute distress. Respiratory system: Air movement with wheezing bilaterally.  Some crackles at the right lung base Cardiovascular system: S1 & S2 heard, RRR.  Positive  JVD Gastrointestinal system: Abdomen is nondistended, soft and nontender.  Central nervous system: Alert and oriented. No focal neurological deficits. Extremities: No lower extremity edema Skin: No rashes, lesions or ulcers Psychiatry: Judgement and insight appear normal. Mood & affect appropriate.    Data Reviewed:    Labs: Basic Metabolic Panel: Recent Labs  Lab 05/22/18 0854 05/23/18 1158 05/24/18 0409  NA 131* 136 133*  K 4.1 4.6 5.4*  CL 99 103 99  CO2 22 24 26   GLUCOSE 236* 111* 285*  BUN 21 21 26*  CREATININE 1.92* 1.62* 1.69*  CALCIUM 8.5* 9.1 9.4   GFR CrCl cannot be calculated (Unknown ideal weight.). Liver Function Tests: Recent Labs  Lab 05/22/18 0854  AST 24  ALT 14  ALKPHOS 53  BILITOT 0.8  PROT 6.5  ALBUMIN 3.2*   Recent Labs  Lab 05/22/18 0854  LIPASE 18   Recent Labs  Lab 05/22/18 1905  AMMONIA 34   Coagulation profile No results for input(s): INR, PROTIME in the last 168 hours.  CBC: Recent Labs  Lab 05/22/18 0854 05/24/18 0409  WBC 9.4 2.9*  NEUTROABS 7.3  --   HGB 13.2 13.0  HCT 42.6 43.0  MCV 82.7 82.4  PLT 216 202   Cardiac Enzymes: No results for input(s): CKTOTAL, CKMB, CKMBINDEX, TROPONINI in the last 168 hours. BNP (last 3 results) Recent Labs    11/16/17 1605  PROBNP 1,859*   CBG: Recent Labs  Lab 05/22/18 2126 05/23/18 0754 05/23/18 1146 05/23/18 1632 05/23/18 2144  GLUCAP 152* 164* 117* 82 185*   D-Dimer: No results for input(s): DDIMER in the last 72 hours. Hgb A1c: Recent Labs    05/24/18 0409  HGBA1C 7.0*   Lipid Profile: No results for input(s): CHOL, HDL, LDLCALC, TRIG, CHOLHDL, LDLDIRECT in the last 72 hours. Thyroid function studies: No results for input(s): TSH, T4TOTAL, T3FREE, THYROIDAB in the last 72 hours.  Invalid input(s): FREET3 Anemia work up: No results for input(s): VITAMINB12, FOLATE, FERRITIN, TIBC, IRON, RETICCTPCT in the last 72 hours. Sepsis Labs: Recent Labs  Lab  05/22/18 0854 05/24/18 0409  WBC 9.4 2.9*   Microbiology No results found for this or any previous visit (from the past 240 hour(s)).   Medications:   . apixaban  5 mg Oral BID  . aspirin EC  81 mg Oral Daily  . carvedilol  25 mg Oral BID WC  . cloNIDine  0.3 mg Oral TID  . furosemide  40 mg Intravenous Q12H  . insulin aspart protamine- aspart  20 Units Subcutaneous Q supper  . insulin aspart protamine- aspart  30 Units Subcutaneous Q breakfast  . magnesium oxide  400 mg Oral Daily  . methylPREDNISolone (SOLU-MEDROL) injection  40 mg Intravenous Once  . mycophenolate  540 mg Oral BID  . NIFEdipine  60 mg Oral Daily  . pantoprazole  40 mg Oral Daily  . pravastatin  40 mg Oral Once per day on Mon Wed Fri  . predniSONE  40 mg Oral Q breakfast  . sodium chloride flush  3 mL Intravenous Q12H  . tacrolimus  1 mg Oral QHS  . tacrolimus  2 mg Oral Daily   Continuous Infusions: . sodium chloride       LOS: 1 day   Charlynne Cousins  [facility]   *Please refer to [email or web address], password [email or web address] to get updated schedule on who will round on this patient, as hospitalists switch teams weekly. If 7PM-7AM, please contact night-coverage at [email or web address], password [email or web address] for any overnight needs.  05/24/2018, 7:59 AM

## 2018-05-24 NOTE — Plan of Care (Signed)
  Problem: Education: Goal: Knowledge of disease and its progression will improve Outcome: Progressing Goal: Individualized Educational Video(s) Outcome: Progressing   Problem: Fluid Volume: Goal: Compliance with measures to maintain balanced fluid volume will improve Outcome: Progressing   Problem: Nutritional: Goal: Ability to make healthy dietary choices will improve Outcome: Progressing

## 2018-05-25 LAB — BASIC METABOLIC PANEL
Anion gap: 11 (ref 5–15)
BUN: 31 mg/dL — ABNORMAL HIGH (ref 8–23)
CO2: 26 mmol/L (ref 22–32)
Calcium: 9.8 mg/dL (ref 8.9–10.3)
Chloride: 97 mmol/L — ABNORMAL LOW (ref 98–111)
Creatinine, Ser: 1.7 mg/dL — ABNORMAL HIGH (ref 0.61–1.24)
GFR calc Af Amer: 49 mL/min — ABNORMAL LOW (ref >60)
GFR calc non Af Amer: 42 mL/min — ABNORMAL LOW (ref >60)
Glucose, Bld: 318 mg/dL — ABNORMAL HIGH (ref 70–99)
Potassium: 4.4 mmol/L (ref 3.5–5.1)
Sodium: 134 mmol/L — ABNORMAL LOW (ref 135–145)

## 2018-05-25 LAB — GLUCOSE, CAPILLARY
Glucose-Capillary: 294 mg/dL — ABNORMAL HIGH (ref 70–99)
Glucose-Capillary: 295 mg/dL — ABNORMAL HIGH (ref 70–99)
Glucose-Capillary: 279 mg/dL — ABNORMAL HIGH (ref 70–99)
Glucose-Capillary: 198 mg/dL — ABNORMAL HIGH (ref 70–99)

## 2018-05-25 MED ORDER — PREDNISONE 20 MG PO TABS
50.0000 mg | ORAL_TABLET | Freq: Every day | ORAL | Status: DC
  Administered 2018-05-26: 50 mg via ORAL
  Filled 2018-05-25: qty 2

## 2018-05-25 MED ORDER — SODIUM POLYSTYRENE SULFONATE 15 GM/60ML PO SUSP
30.0000 g | Freq: Once | ORAL | Status: AC
  Administered 2018-05-25: 30 g via ORAL
  Filled 2018-05-25: qty 120

## 2018-05-25 MED ORDER — SODIUM POLYSTYRENE SULFONATE 15 GM/60ML PO SUSP: 30.0000 g | Freq: Once | ORAL | Status: DC

## 2018-05-25 NOTE — Progress Notes (Signed)
[facility] PROGRESS NOTE    Progress Note  [name]  JXB:[id number] DOB: December 07, 1955 DOA: 05/22/2018 PCP: Lanae Boast, FNP     Brief Narrative:   [name] is an 63 y.o. male past medical history of heroin abuse on methadone, renal transplant atrial fibrillation on anticoagulation who presents with a syncopal episode after taking his daily methadone dose this morning.  EMS was called he was given Narcan and he awakened, several seconds after that he became somnolent he was given a another dose of Narcan as he was becoming hypoxic and somnolent.  The patient on admission was awake.  Assessment/Plan:   Acute respiratory failure with hypoxia : Likely due to accidental methadone overdose.   Influenza PCR was negative. Checks x-ray was unrevealing. Still wheezing on physical exam, will try to wean him off oxygen. He relates his breathing is improved but not back to baseline. Change steroids to oral.  URI: Remain afebrile breathing has improved no smoking or COPD. Patient is still wheezing on physical exam he still feels short of breath. Change steroids to oral.  Methadone overdose accidentally: Resolved with Narcan.  DM (diabetes mellitus), type 2, uncontrolled (HCC) Continue 70/30 insulin. Blood glucose running high this morning likely due to steroids will start decreasing his steroids today.  Continue current dose of 7030. A1c was 7.0.  Chronic atrial fibrillation: Rate control continue Eliquis.  Chronic kidney disease stage III: Status post renal transplant, creatinine at baseline.  Continue immunosuppressive therapy.  Essential  HTN (hypertension): Cont current meds.  New hyperkalemia: Repeat a basic metabolic panel, he has not received potassium supplements while he is on no meds that would cause his potassium diarrhea. Recheck a basic metabolic panel. We will give more Kayexalate recheck a basic metabolic panel in the morning.  DVT prophylaxis:  lovexno Family Communication:none Disposition Plan/Barrier to D/C: Home in 1-2 days Code Status:     Code Status Orders  (From admission, onward)         Start     Ordered   05/22/18 1655  Full code  Continuous     05/22/18 1655        Code Status History    Date Active Date Inactive Code Status Order ID Comments User Context   01/27/2018 2241 01/28/2018 1723 Full Code [id number]  Gwynne Edinger, MD Inpatient   01/02/2018 1551 01/05/2018 1207 Full Code [id number]  Arnell Asal, NP ED   09/23/2017 2253 09/28/2017 1317 Full Code [id number]  Gwynne Edinger, MD ED   11/03/2016 2017 11/04/2016 2247 Full Code [id number]  Reubin Milan, MD ED   11/03/2016 2017 11/03/2016 2017 Full Code [id number]  Reubin Milan, MD ED   10/16/2013 1847 10/19/2013 1802 Full Code [id number]  Bonnielee Haff, MD Inpatient   05/10/2011 1722 05/13/2011 1511 Full Code [id number]  Verlee Monte, MD Inpatient   04/12/2011 0458 04/14/2011 2309 Full Code [id number]  Starla Link, RN Inpatient        IV Access:    Peripheral IV   Procedures and diagnostic studies:   No results found.   Medical Consultants:    None.  Anti-Infectives:   None  Subjective:    [name] he relates his shortness of breath is not at baseline still feel winded with ambulation.  Objective:    Vitals:   05/24/18 0500 05/24/18 1447 05/24/18 2037 05/25/18 0546  BP: (!) 151/89 131/79 140/81 107/64  Pulse: 85 78 68 (!) 58  Resp:  18 11 18 16   Temp: 97.8 F (36.6 C) 98.3 F (36.8 C) 97.8 F (36.6 C) 97.7 F (36.5 C)  TempSrc: Oral Tympanic Oral Oral  SpO2: 98% 99% 96% 99%    Intake/Output Summary (Last 24 hours) at 05/25/2018 0945 Last data filed at 05/25/2018 0500 Gross per 24 hour  Intake 360 ml  Output 2950 ml  Net -2590 ml   There were no vitals filed for this visit.  Exam: General exam: In no acute distress. Respiratory system: Air movement with wheezing bilaterally.   Cardiovascular  system: S1 & S2 heard, RRR.  Negative JVD Gastrointestinal system: Abdomen is nondistended, soft and nontender.  Central nervous system: Alert and oriented. No focal neurological deficits. Extremities: No lower extremity edema Skin: No rashes, lesions or ulcers Psychiatry: Judgement and insight appear normal. Mood & affect appropriate.    Data Reviewed:    Labs: Basic Metabolic Panel: Recent Labs  Lab 05/22/18 0854 05/23/18 1158 05/24/18 0409  NA 131* 136 133*  K 4.1 4.6 5.4*  CL 99 103 99  CO2 22 24 26   GLUCOSE 236* 111* 285*  BUN 21 21 26*  CREATININE 1.92* 1.62* 1.69*  CALCIUM 8.5* 9.1 9.4   GFR CrCl cannot be calculated (Unknown ideal weight.). Liver Function Tests: Recent Labs  Lab 05/22/18 0854  AST 24  ALT 14  ALKPHOS 53  BILITOT 0.8  PROT 6.5  ALBUMIN 3.2*   Recent Labs  Lab 05/22/18 0854  LIPASE 18   Recent Labs  Lab 05/22/18 1905  AMMONIA 34   Coagulation profile No results for input(s): INR, PROTIME in the last 168 hours.  CBC: Recent Labs  Lab 05/22/18 0854 05/24/18 0409  WBC 9.4 2.9*  NEUTROABS 7.3  --   HGB 13.2 13.0  HCT 42.6 43.0  MCV 82.7 82.4  PLT 216 202   Cardiac Enzymes: No results for input(s): CKTOTAL, CKMB, CKMBINDEX, TROPONINI in the last 168 hours. BNP (last 3 results) Recent Labs    11/16/17 1605  PROBNP 1,859*   CBG: Recent Labs  Lab 05/23/18 2144 05/24/18 0737 05/24/18 1149 05/24/18 1704 05/24/18 2132  GLUCAP 185* 294* 273* 290* 305*   D-Dimer: No results for input(s): DDIMER in the last 72 hours. Hgb A1c: Recent Labs    05/24/18 0409  HGBA1C 7.0*   Lipid Profile: No results for input(s): CHOL, HDL, LDLCALC, TRIG, CHOLHDL, LDLDIRECT in the last 72 hours. Thyroid function studies: No results for input(s): TSH, T4TOTAL, T3FREE, THYROIDAB in the last 72 hours.  Invalid input(s): FREET3 Anemia work up: No results for input(s): VITAMINB12, FOLATE, FERRITIN, TIBC, IRON, RETICCTPCT in the last 72  hours. Sepsis Labs: Recent Labs  Lab 05/22/18 0854 05/24/18 0409  WBC 9.4 2.9*   Microbiology No results found for this or any previous visit (from the past 240 hour(s)).   Medications:   . apixaban  5 mg Oral BID  . aspirin EC  81 mg Oral Daily  . carvedilol  25 mg Oral BID WC  . cloNIDine  0.3 mg Oral TID  . insulin aspart protamine- aspart  27 Units Subcutaneous Q supper  . insulin aspart protamine- aspart  47 Units Subcutaneous Q breakfast  . magnesium oxide  400 mg Oral Daily  . methylPREDNISolone (SOLU-MEDROL) injection  40 mg Intravenous Q12H  . mycophenolate  540 mg Oral BID  . NIFEdipine  60 mg Oral Daily  . pantoprazole  40 mg Oral Daily  . pravastatin  40 mg Oral Once per  day on Mon Wed Fri  . sodium chloride flush  3 mL Intravenous Q12H  . sodium polystyrene  30 g Oral Once  . tacrolimus  1 mg Oral QHS  . tacrolimus  2 mg Oral Daily   Continuous Infusions: . sodium chloride       LOS: 2 days   Charlynne Cousins  [facility]   *Please refer to [email or web address], password [email or web address] to get updated schedule on who will round on this patient, as hospitalists switch teams weekly. If 7PM-7AM, please contact night-coverage at [email or web address], password [email or web address] for any overnight needs.  05/25/2018, 9:45 AM

## 2018-05-25 NOTE — Progress Notes (Signed)
Inpatient Diabetes Program Recommendations  AACE/ADA: New Consensus Statement on Inpatient Glycemic Control (2015)  Target Ranges:  Prepandial:   less than 140 mg/dL      Peak postprandial:   less than 180 mg/dL (1-2 hours)      Critically ill patients:  140 - 180 mg/dL   Lab Results  Component Value Date   GLUCAP 295 (H) 05/25/2018   HGBA1C 7.0 (H) 05/24/2018    Review of Glycemic Control Results for [name], [name] (MRN [id number]) as of 05/25/2018 13:40  Ref. Range 05/23/2018 11:46 05/23/2018 16:32 05/23/2018 21:44 05/24/2018 07:37 05/24/2018 11:49 05/24/2018 17:04 05/24/2018 21:32 05/25/2018 07:33 05/25/2018 11:06  Glucose-Capillary Latest Ref Range: 70 - 99 mg/dL 117 (H) 82 185 (H) 294 (H) 273 (H) 290 (H) 305 (H) 294 (H) 295 (H)   Diabetes history: DM 2 Outpatient Diabetes medications:  Novolog 70/30 45 units in the AM and 25 units in the evening Current orders for Inpatient glycemic control:  Novolog 70/30 47 units in the AM and 25 units in the PM Prednisone 50 mg daily Inpatient Diabetes Program Recommendations:   Note that 70/30 increased this morning. May consider adding Novolog sensitive tid with meals.   Will follow.   Thanks,  Adah Perl, RN, BC-ADM Inpatient Diabetes Coordinator Pager [phone] (8a-5p)

## 2018-05-26 DIAGNOSIS — E785 Hyperlipidemia, unspecified: Secondary | ICD-10-CM

## 2018-05-26 LAB — GLUCOSE, CAPILLARY
Glucose-Capillary: 99 mg/dL (ref 70–99)
Glucose-Capillary: 64 mg/dL — ABNORMAL LOW (ref 70–99)
Glucose-Capillary: 65 mg/dL — ABNORMAL LOW (ref 70–99)
Glucose-Capillary: 79 mg/dL (ref 70–99)
Glucose-Capillary: 63 mg/dL — ABNORMAL LOW (ref 70–99)
Glucose-Capillary: 134 mg/dL — ABNORMAL HIGH (ref 70–99)

## 2018-05-26 LAB — BASIC METABOLIC PANEL
Anion gap: 11 (ref 5–15)
BUN: 28 mg/dL — ABNORMAL HIGH (ref 8–23)
CO2: 28 mmol/L (ref 22–32)
Calcium: 9.6 mg/dL (ref 8.9–10.3)
Chloride: 98 mmol/L (ref 98–111)
Creatinine, Ser: 1.5 mg/dL — ABNORMAL HIGH (ref 0.61–1.24)
GFR calc Af Amer: 57 mL/min — ABNORMAL LOW (ref >60)
GFR calc non Af Amer: 49 mL/min — ABNORMAL LOW (ref >60)
Glucose, Bld: 148 mg/dL — ABNORMAL HIGH (ref 70–99)
Potassium: 3.3 mmol/L — ABNORMAL LOW (ref 3.5–5.1)
Sodium: 137 mmol/L (ref 135–145)

## 2018-05-26 MED ORDER — GLUCOSE 40 % PO GEL
1.0000 | Freq: Once | ORAL | Status: AC
  Administered 2018-05-26: 37.5 g via ORAL
  Filled 2018-05-26: qty 1

## 2018-05-26 MED ORDER — POTASSIUM CHLORIDE CRYS ER 20 MEQ PO TBCR: 40.0000 meq | EXTENDED_RELEASE_TABLET | Freq: Two times a day (BID) | ORAL | Status: DC

## 2018-05-26 MED ORDER — PREDNISONE 10 MG PO TABS: ORAL_TABLET | ORAL | 0 refills | Status: DC

## 2018-05-26 MED ORDER — POTASSIUM CHLORIDE CRYS ER 20 MEQ PO TBCR
40.0000 meq | EXTENDED_RELEASE_TABLET | Freq: Once | ORAL | Status: AC
  Administered 2018-05-26: 40 meq via ORAL
  Filled 2018-05-26: qty 2

## 2018-05-26 NOTE — Discharge Summary (Signed)
Physician Discharge Summary  ARCH METHOT EHU:[id number] DOB: [birth year] DOA: 05/22/2018  PCP: Lanae Boast, FNP  Admit date: 05/22/2018 Discharge date: 05/26/2018  Admitted From: home Disposition:  Home  Recommendations for Outpatient Follow-up:  1. Follow up with PCP in 1-2 weeks 2. Please obtain BMP/CBC in one week   Home Health:No Equipment/Devices:None  Discharge Condition:stable CODE STATUS:full Diet recommendation: Heart Healthy   Brief/Interim Summary: 63 y.o. male past medical history of heroin abuse on methadone, renal transplant atrial fibrillation on anticoagulation who presents with a syncopal episode after taking his daily methadone dose this morning.  EMS was called he was given Narcan and he awakened, several seconds after that he became somnolent he was given a another dose of Narcan as he was becoming hypoxic and somnolent.  The patient on admission was awake.  Discharge Diagnoses:  Principal Problem:   Acute respiratory failure with hypoxia (HCC) Active Problems:   DM (diabetes mellitus), type 2, uncontrolled (HCC)   HTN (hypertension)   Atrial fibrillation (HCC)   Hyperlipidemia   CKD (chronic kidney disease) stage 3, GFR 30-59 ml/min (HCC)   Methadone overdose ([place])   Acute respiratory failure (HCC) Acute respiratory failure with hypoxia likely due to accidental methadone overdose dose: Influenza PCR was negative, chest x-ray was unrevealing, he was wheezing on physical exam so he was started on steroids and was able to wean off the oxygen's. Lites his breathing improved back to baseline steroids were changed to oral.  Upper respiratory infection: He remained afebrile, he has no history of smoking or COPD. He was wheezing on physical exam he was started on oral steroids and improve his wheezing. Possibly was due to a viral respiratory infection  Methadone overdose accidentally: Resolved with Narcan.  Diabetes mellitus type 2 uncontrolled: No change  were made to his medication.  Chronic atrial fibrillation: Rate controlled continue Eliquis.  Chronic kidney disease stage III: His creatinine remained at baseline no change made to his medication.  Essential hypertension: No changes made to his medication.  New hyperkalemia: Likely due to dehydration resolved, he was given a single dose of Kayexalate.  He does not take potassium supplements at home.     Discharge Instructions  Discharge Instructions    Diet - low sodium heart healthy   Complete by:  As directed    Increase activity slowly   Complete by:  As directed      Allergies as of 05/26/2018      Reactions   Tramadol Hives, Itching, Rash      Medication List    TAKE these medications   albuterol (2.5 MG/3ML) 0.083% nebulizer solution Commonly known as:  PROVENTIL Take 3 mLs (2.5 mg total) by nebulization every 6 (six) hours as needed for wheezing or shortness of breath. What changed:  Another medication with the same name was changed. Make sure you understand how and when to take each.   VENTOLIN HFA 108 (90 Base) MCG/ACT inhaler Generic drug:  albuterol INHALE 2 PUFFS INTO THE LUNGS EVERY 6 HOURS AS NEEDED FOR WHEEZING OR SHORTNESS OF BREATH What changed:  See the new instructions.   apixaban 5 MG Tabs tablet Commonly known as:  ELIQUIS Take 1 tablet (5 mg total) by mouth 2 (two) times daily.   aspirin EC 81 MG tablet Take 81 mg by mouth daily.   B-D UF III MINI PEN NEEDLES 31G X 5 MM Misc Generic drug:  Insulin Pen Needle U UTD   carvedilol 25 MG tablet Commonly  known as:  COREG TAKE 1 TABLET(25 MG) BY MOUTH TWICE DAILY WITH A MEAL What changed:  See the new instructions.   cloNIDine 0.3 MG tablet Commonly known as:  CATAPRES Take 0.3 mg by mouth 3 (three) times daily.   furosemide 20 MG tablet Commonly known as:  LASIX Take 2 tablets (40 mg total) by mouth 2 (two) times daily. What changed:  Another medication with the same name was removed.  Continue taking this medication, and follow the directions you see here.   insulin aspart protamine- aspart (70-30) 100 UNIT/ML injection Commonly known as:  NOVOLOG MIX 70/30 Injects 30 units every morning and injects 20 units every evening (follow up with your PCP for adjustments) What changed:    how much to take  additional instructions   magnesium oxide 400 MG tablet Commonly known as:  MAG-OX Take 400 mg by mouth daily.   methadone 10 MG/5ML solution Commonly known as:  DOLOPHINE Take 42.5 mLs (85 mg total) by mouth daily. Goes to a clinic every day (please [facility] with [facility] for adjustments to your dose) What changed:    how much to take  additional instructions   mycophenolate 180 MG EC tablet Commonly known as:  MYFORTIC Take 540 mg by mouth 2 (two) times daily.   NIFEdipine 60 MG 24 hr tablet Commonly known as:  PROCARDIA XL/NIFEDICAL XL Take 60 mg by mouth daily.   omeprazole 40 MG capsule Commonly known as:  PRILOSEC Take 40 mg by mouth daily before breakfast.   POLY-IRON 150 150 MG capsule Generic drug:  iron polysaccharides Take 150 mg by mouth daily.   pravastatin 40 MG tablet Commonly known as:  PRAVACHOL Take 40 mg by mouth 3 (three) times a week. Monday, Wed, Fri.   predniSONE 10 MG tablet Commonly known as:  DELTASONE Takes 6 tablets for 1 days, then 5 tablets for 1 days, then 4 tablets for 1 days, then 3 tablets for 1 days, then 2 tabs for 1 days, then 1 tab for 1 days, and then stop.   pregabalin 100 MG capsule Commonly known as:  LYRICA Take 100 mg by mouth 3 (three) times daily.   Spacer/Aero Chamber Mouthpiece Misc 1 each by Does not apply route every 6 (six) hours as needed.   tacrolimus 1 MG capsule Commonly known as:  PROGRAF Take 1-2 mg by mouth See admin instructions. Take 2 mg by mouth in the morning and 1 mg in the evening       Allergies  Allergen Reactions  . Tramadol Hives, Itching and Rash     Consultations:  None   Procedures/Studies: Dg Chest 2 View  Result Date: 05/22/2018 CLINICAL DATA:  Syncopal episode. Cough and congestion. EXAM: CHEST - 2 VIEW COMPARISON:  01/27/2018 FINDINGS: Artifact overlies the chest. Chronic cardiac enlargement. Chronic aortic atherosclerosis. The lungs are clear. No infiltrate, collapse or effusion. No acute bone finding. IMPRESSION: No active disease. Chronic cardiomegaly and aortic atherosclerosis. Electronically Signed   By: Nelson Chimes M.D.   On: 05/22/2018 09:39   Ct Head Wo Contrast  Result Date: 05/22/2018 CLINICAL DATA:  Headache. Fall. Patient anticoagulated. EXAM: CT HEAD WITHOUT CONTRAST TECHNIQUE: Contiguous axial images were obtained from the base of the skull through the vertex without intravenous contrast. COMPARISON:  01/02/2018 FINDINGS: Brain: No evidence of acute or subacute infarction. There chronic small-vessel ischemic changes affecting the pons, basal ganglia region and hemispheric white matter. No large vessel territory infarction. No mass lesion, hemorrhage,  hydrocephalus or extra-axial collection. Vascular: There is atherosclerotic calcification of the major vessels at the base of the brain. Skull: No skull fracture. Sinuses/Orbits: Inflammatory changes of the left division of the frontal sinus and ethmoid sinus. Other: None IMPRESSION: No acute or traumatic finding. Chronic small-vessel ischemic changes as outlined above. Electronically Signed   By: Nelson Chimes M.D.   On: 05/22/2018 10:40   Dg Chest Portable 1 View  Result Date: 05/22/2018 CLINICAL DATA:  Cough and hypoxia EXAM: PORTABLE CHEST 1 VIEW COMPARISON:  May 22, 2018 FINDINGS: There is no appreciable edema or consolidation. There is cardiomegaly with pulmonary vascularity normal. No adenopathy. There is aortic atherosclerosis. No bone lesions. IMPRESSION: Cardiomegaly. No edema or consolidation. There is aortic atherosclerosis. Aortic Atherosclerosis  (ICD10-I70.0). Electronically Signed   By: Lowella Grip III M.D.   On: 05/22/2018 16:34     Subjective: No complaints feels great.  Discharge Exam: Vitals:   05/26/18 0547 05/26/18 0911  BP: (!) 156/73 131/74  Pulse: (!) 51   Resp:    Temp: 97.8 F (36.6 C)   SpO2: 99%    Vitals:   05/25/18 1633 05/25/18 2201 05/26/18 0547 05/26/18 0911  BP: 133/86 138/77 (!) 156/73 131/74  Pulse:  78 (!) 51   Resp:      Temp:  98 F (36.7 C) 97.8 F (36.6 C)   TempSrc:  Oral Oral   SpO2:  97% 99%   Weight:   86.5 kg   Height:   5\' 10"  (1.778 m)     General: Pt is alert, awake, not in acute distress Cardiovascular: RRR, S1/S2 +, no rubs, no gallops Respiratory: CTA bilaterally, no wheezing, no rhonchi Abdominal: Soft, NT, ND, bowel sounds + Extremities: no edema, no cyanosis    The results of significant diagnostics from this hospitalization (including imaging, microbiology, ancillary and laboratory) are listed below for reference.     Microbiology: No results found for this or any previous visit (from the past 240 hour(s)).   Labs: BNP (last 3 results) Recent Labs    01/02/18 1938 01/27/18 1616 05/23/18 1125  BNP 420.3* 376.4* 616.0*   Basic Metabolic Panel: Recent Labs  Lab 05/22/18 0854 05/23/18 1158 05/24/18 0409 05/25/18 0954 05/26/18 0323  NA 131* 136 133* 134* 137  K 4.1 4.6 5.4* 4.4 3.3*  CL 99 103 99 97* 98  CO2 22 24 26 26 28   GLUCOSE 236* 111* 285* 318* 148*  BUN 21 21 26* 31* 28*  CREATININE 1.92* 1.62* 1.69* 1.70* 1.50*  CALCIUM 8.5* 9.1 9.4 9.8 9.6   Liver Function Tests: Recent Labs  Lab 05/22/18 0854  AST 24  ALT 14  ALKPHOS 53  BILITOT 0.8  PROT 6.5  ALBUMIN 3.2*   Recent Labs  Lab 05/22/18 0854  LIPASE 18   Recent Labs  Lab 05/22/18 1905  AMMONIA 34   CBC: Recent Labs  Lab 05/22/18 0854 05/24/18 0409  WBC 9.4 2.9*  NEUTROABS 7.3  --   HGB 13.2 13.0  HCT 42.6 43.0  MCV 82.7 82.4  PLT 216 202   Cardiac  Enzymes: No results for input(s): CKTOTAL, CKMB, CKMBINDEX, TROPONINI in the last 168 hours. BNP: Invalid input(s): POCBNP CBG: Recent Labs  Lab 05/25/18 0733 05/25/18 1106 05/25/18 1653 05/25/18 2158 05/26/18 0734  GLUCAP 294* 295* 279* 198* 99   D-Dimer No results for input(s): DDIMER in the last 72 hours. Hgb A1c Recent Labs    05/24/18 0409  HGBA1C 7.0*  Lipid Profile No results for input(s): CHOL, HDL, LDLCALC, TRIG, CHOLHDL, LDLDIRECT in the last 72 hours. Thyroid function studies No results for input(s): TSH, T4TOTAL, T3FREE, THYROIDAB in the last 72 hours.  Invalid input(s): FREET3 Anemia work up No results for input(s): VITAMINB12, FOLATE, FERRITIN, TIBC, IRON, RETICCTPCT in the last 72 hours. Urinalysis    Component Value Date/Time   COLORURINE STRAW (A) 01/27/2018 1616   APPEARANCEUR CLEAR 01/27/2018 1616   LABSPEC 1.008 01/27/2018 1616   PHURINE 6.0 01/27/2018 1616   GLUCOSEU NEGATIVE 01/27/2018 1616   HGBUR NEGATIVE 01/27/2018 1616   BILIRUBINUR neg 04/26/2018 1426   KETONESUR NEGATIVE 01/27/2018 1616   PROTEINUR Positive (A) 04/26/2018 1426   PROTEINUR 100 (A) 01/27/2018 1616   UROBILINOGEN 1.0 04/26/2018 1426   UROBILINOGEN 0.2 07/25/2017 1004   NITRITE neg 04/26/2018 1426   NITRITE NEGATIVE 01/27/2018 1616   LEUKOCYTESUR Trace (A) 04/26/2018 1426   Sepsis Labs Invalid input(s): PROCALCITONIN,  WBC,  LACTICIDVEN Microbiology No results found for this or any previous visit (from the past 240 hour(s)).   Time coordinating discharge: 40 minutes  SIGNED:   Charlynne Cousins, MD  [facility] 05/26/2018, 10:37 AM Pager   If 7PM-7AM, please contact night-coverage [email or web address] Password [email or web address]

## 2018-05-26 NOTE — Progress Notes (Signed)
Patient received discharge information and acknowledged understanding of it. Patient received printed prescription. Patient IV was removed.

## 2018-06-01 ENCOUNTER — Ambulatory Visit (INDEPENDENT_AMBULATORY_CARE_PROVIDER_SITE_OTHER): Payer: Medicare Other | Admitting: Pulmonary Disease

## 2018-06-01 ENCOUNTER — Encounter: Payer: Self-pay | Admitting: Pulmonary Disease

## 2018-06-01 VITALS — BP 146/78 | HR 85 | Ht 68.5 in | Wt 207.2 lb

## 2018-06-01 DIAGNOSIS — I272 Pulmonary hypertension, unspecified: Secondary | ICD-10-CM

## 2018-06-01 DIAGNOSIS — Z9189 Other specified personal risk factors, not elsewhere classified: Secondary | ICD-10-CM

## 2018-06-01 DIAGNOSIS — I509 Heart failure, unspecified: Secondary | ICD-10-CM

## 2018-06-01 DIAGNOSIS — R0602 Shortness of breath: Secondary | ICD-10-CM

## 2018-06-01 NOTE — Patient Instructions (Signed)
I am glad you are doing well with regard to breathing after recent hospitalization Please continue follow-up with cardiology We will order split-night sleep study for evaluation of sleep apnea Follow-up in 3 months.

## 2018-06-01 NOTE — Progress Notes (Signed)
[name]    [id number]    10/12/1955  Primary Care Physician:Douglas, Venora Maples, [place]  Referring Physician: Lanae Boast, [place], [place] [address]  Chief complaint: Follow up for dyspnea, pulmonary hypertension  HPI: 62 year old with history of kidney transplant, atrial fibrillation, diabetes mellitus, HFpEF Admitted to [facility] in May 2019 with dyspnea, hemoptysis and found to have bilateral infiltrates. VQ scan ruled out pulmonary embolism.  Echocardiogram showed preserved ejection fraction with diastolic dysfunction, LVH, elevated pulmonary artery pressures and dilated IVC. He was diuresesed about 2.5 L with improvement in symptoms.  Treated with azithromycin and discharged on oral Augmentin for presumed pneumonia.  Sent here for evaluation of dyspnea, pulmonary hypertension.  States that he has dyspnea with activity, relieved with rest.  No cough, sputum production, wheezing.  Has significant orthopnea, lower extremity edema He has been evaluated by Dr. Bridgett Larsson, Vascular surgery for chronic lymphedema, venous stasis of the lower extremities and recommended sequential compression.  Has history of atrial fibrillation, diastolic heart failure.  Used to follow with cardiology at [facility].  He was last seen in 2016 at which point he was on amiodarone, apixaban anticoagulation and noted to be in normal sinus rhythm.  He has not had a follow-up since then.  Currently he is not on any anticoagulation and is on rate control with Coreg Split-night sleep study has been ordered by primary care and is pending.  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  Currently on disability Exposures: No known exposures, no leak, mold, hot tub, Jacuzzi Smoking history: Never smoker Travel history: Previously lived in [place] in [place].  Moved to [facility] 2003  Interim history: Hospitalized in August 2019 with  altered mental status due to suspected unintentional overdose with methadone Complains of dyspnea on exertion, no cough.  Denies fevers, chills, wheezing  Hospitalized again in January 2020 for opiate overdose, acute respiratory failure due to overdose Noted to be wheezing on physical examination.  He was given a prednisone taper.  He was briefly on supplemental oxygen.  Flu PCR was negative  Outpatient Encounter Medications as of 06/01/2018  Medication Sig  . albuterol (PROVENTIL) (2.5 MG/3ML) 0.083% nebulizer solution Take 3 mLs (2.5 mg total) by nebulization every 6 (six) hours as needed for wheezing or shortness of breath.  Marland Kitchen apixaban (ELIQUIS) 5 MG TABS tablet Take 1 tablet (5 mg total) by mouth 2 (two) times daily.  Marland Kitchen aspirin EC 81 MG tablet Take 81 mg by mouth daily.  . B-D UF III MINI PEN NEEDLES 31G X 5 MM MISC U UTD  . carvedilol (COREG) 25 MG tablet TAKE 1 TABLET(25 MG) BY MOUTH TWICE DAILY WITH A MEAL (Patient taking differently: Take 25 mg by mouth 2 (two) times daily with a meal. )  . cloNIDine (CATAPRES) 0.3 MG tablet Take 0.3 mg by mouth 3 (three) times daily.   . furosemide (LASIX) 20 MG tablet Take 2 tablets (40 mg total) by mouth 2 (two) times daily.  . insulin aspart protamine- aspart (NOVOLOG MIX 70/30) (70-30) 100 UNIT/ML injection Injects 30 units every morning and injects 20 units every evening (follow up with your PCP for adjustments) (Patient taking differently: 25-45 Units. Injects 45 units every morning and injects 25 units every evening (follow up with your PCP for adjustments))  . magnesium oxide (MAG-OX) 400 MG tablet Take 400 mg by  mouth daily.   . methadone (DOLOPHINE) 10 MG/5ML solution Take 42.5 mLs (85 mg total) by mouth daily. Goes to a clinic every day (please follow up with [facility] for adjustments to your dose) (Patient taking differently: Take 115 mg by mouth daily. )  . mycophenolate (MYFORTIC) 180 MG EC tablet Take 540 mg by mouth 2 (two) times daily.     Marland Kitchen NIFEdipine (PROCARDIA XL/ADALAT-CC) 60 MG 24 hr tablet Take 60 mg by mouth daily.  Marland Kitchen omeprazole (PRILOSEC) 40 MG capsule Take 40 mg by mouth daily before breakfast.  . POLY-IRON 150 150 MG capsule Take 150 mg by mouth daily.  . pravastatin (PRAVACHOL) 40 MG tablet Take 40 mg by mouth 3 (three) times a week. Monday, Wed, Fri.  . predniSONE (DELTASONE) 10 MG tablet Takes 6 tablets for 1 days, then 5 tablets for 1 days, then 4 tablets for 1 days, then 3 tablets for 1 days, then 2 tabs for 1 days, then 1 tab for 1 days, and then stop.  . pregabalin (LYRICA) 100 MG capsule Take 100 mg by mouth 3 (three) times daily.   Marland Kitchen Spacer/Aero Chamber Mouthpiece MISC 1 each by Does not apply route every 6 (six) hours as needed.  . tacrolimus (PROGRAF) 1 MG capsule Take 1-2 mg by mouth See admin instructions. Take 2 mg by mouth in the morning and 1 mg in the evening  . VENTOLIN HFA 108 (90 Base) MCG/ACT inhaler INHALE 2 PUFFS INTO THE LUNGS EVERY 6 HOURS AS NEEDED FOR WHEEZING OR SHORTNESS OF BREATH (Patient taking differently: Inhale 2 puffs into the lungs every 6 (six) hours as needed for wheezing or shortness of breath. )   No facility-administered encounter medications on file as of 06/01/2018.    Physical Exam: Blood pressure (!) 146/78, pulse 85, height 5' 8.5" (1.74 m), weight 207 lb 3.2 oz (94 kg), SpO2 98 %. Gen:      No acute distress HEENT:  EOMI, sclera anicteric Neck:     No masses; no thyromegaly Lungs:    Clear to auscultation bilaterally; normal respiratory effort CV:         Regular rate and rhythm; no murmurs Abd:      + bowel sounds; soft, non-tender; no palpable masses, no distension Ext:    No edema; adequate peripheral perfusion Skin:      Warm and dry; no rash Neuro: alert and oriented x 3 Psych: normal mood and affect  Data Reviewed: Imaging CT chest 04/06/2014- clear lungs bilaterally. Chest x-ray 09/23/2017- bilateral airspace opacities. Chest x-ray 10/05/2017-improving aeration  bilaterally. Chest x-ray 10/25/2017-cardiomegaly, mild interstitial prominence Chest x-ray 06/08/2018- cardiomegaly, no acute infiltrate, edema. I have reviewed the images personally.  PFTs 01/27/2018-FVC 1.86 (49%), FEV1 1.52 [52%), F/F 82, TLC 66%, DLCO 49%, DLCO/VA 100% Severe restriction, moderate-severe diffusion defect that corrects for alveolar volume.  Cardiac Echocardiogram 09/24/2017- LVEF 55-60%, atrial enlargement, moderate LVH, mild to moderate pulmonary hypertension.  PA peak pressure 48.  Mildly dilated IVC.  Assessment:  Follow-up for dyspnea, pulmonary hypertension PFTs reviewed with no obstruction, there is moderate restriction and diffusion impairment He is a non-smoker with no evidence of COPD or interstitial lung disease  Suspect that dyspnea and elevated PA pressures are secondary to pulmonary venous hypertension from atrial fibrillation, HFpEF and sleep apnea. Follow-up with cardiology.  Suspected OSA Scheduled for sleep study by primary care but has not been completed yet We will reschedule.  Review test results when available.  Plan/Recommendations: - Follow sleep  study results.  Marshell Garfinkel MD [facility] 06/01/2018, 9:18 AM  CC: Lanae Boast, FNP

## 2018-06-16 DIAGNOSIS — Z79899 Other long term (current) drug therapy: Secondary | ICD-10-CM | POA: Diagnosis not present

## 2018-06-16 DIAGNOSIS — Z5181 Encounter for therapeutic drug level monitoring: Secondary | ICD-10-CM | POA: Diagnosis not present

## 2018-06-16 DIAGNOSIS — D899 Disorder involving the immune mechanism, unspecified: Secondary | ICD-10-CM | POA: Diagnosis not present

## 2018-06-16 DIAGNOSIS — K5900 Constipation, unspecified: Secondary | ICD-10-CM | POA: Diagnosis not present

## 2018-06-16 DIAGNOSIS — I158 Other secondary hypertension: Secondary | ICD-10-CM | POA: Diagnosis not present

## 2018-06-16 DIAGNOSIS — Z940 Kidney transplant status: Secondary | ICD-10-CM | POA: Diagnosis not present

## 2018-06-16 DIAGNOSIS — Z792 Long term (current) use of antibiotics: Secondary | ICD-10-CM | POA: Diagnosis not present

## 2018-06-16 DIAGNOSIS — Z4822 Encounter for aftercare following kidney transplant: Secondary | ICD-10-CM | POA: Diagnosis not present

## 2018-06-16 DIAGNOSIS — Z23 Encounter for immunization: Secondary | ICD-10-CM | POA: Diagnosis not present

## 2018-06-16 DIAGNOSIS — E119 Type 2 diabetes mellitus without complications: Secondary | ICD-10-CM | POA: Diagnosis not present

## 2018-06-16 DIAGNOSIS — Z794 Long term (current) use of insulin: Secondary | ICD-10-CM | POA: Diagnosis not present

## 2018-06-16 DIAGNOSIS — I10 Essential (primary) hypertension: Secondary | ICD-10-CM | POA: Diagnosis not present

## 2018-06-16 DIAGNOSIS — E785 Hyperlipidemia, unspecified: Secondary | ICD-10-CM | POA: Diagnosis not present

## 2018-06-22 DIAGNOSIS — G8929 Other chronic pain: Secondary | ICD-10-CM | POA: Diagnosis not present

## 2018-06-22 DIAGNOSIS — E669 Obesity, unspecified: Secondary | ICD-10-CM | POA: Diagnosis not present

## 2018-06-22 DIAGNOSIS — I272 Pulmonary hypertension, unspecified: Secondary | ICD-10-CM | POA: Diagnosis not present

## 2018-06-22 DIAGNOSIS — I151 Hypertension secondary to other renal disorders: Secondary | ICD-10-CM | POA: Diagnosis not present

## 2018-06-22 DIAGNOSIS — N529 Male erectile dysfunction, unspecified: Secondary | ICD-10-CM | POA: Diagnosis not present

## 2018-06-22 DIAGNOSIS — E785 Hyperlipidemia, unspecified: Secondary | ICD-10-CM | POA: Diagnosis not present

## 2018-06-22 DIAGNOSIS — I4892 Unspecified atrial flutter: Secondary | ICD-10-CM | POA: Diagnosis not present

## 2018-06-22 DIAGNOSIS — N2889 Other specified disorders of kidney and ureter: Secondary | ICD-10-CM | POA: Diagnosis not present

## 2018-06-22 DIAGNOSIS — Z940 Kidney transplant status: Secondary | ICD-10-CM | POA: Diagnosis not present

## 2018-06-22 DIAGNOSIS — E119 Type 2 diabetes mellitus without complications: Secondary | ICD-10-CM | POA: Diagnosis not present

## 2018-06-22 DIAGNOSIS — Z79899 Other long term (current) drug therapy: Secondary | ICD-10-CM | POA: Diagnosis not present

## 2018-07-14 ENCOUNTER — Other Ambulatory Visit: Payer: Self-pay | Admitting: Family Medicine

## 2018-07-14 DIAGNOSIS — I509 Heart failure, unspecified: Secondary | ICD-10-CM

## 2018-07-19 ENCOUNTER — Other Ambulatory Visit: Payer: Self-pay | Admitting: Family Medicine

## 2018-07-19 DIAGNOSIS — I509 Heart failure, unspecified: Secondary | ICD-10-CM

## 2018-07-26 ENCOUNTER — Ambulatory Visit: Payer: Medicare Other | Admitting: Family Medicine

## 2018-07-27 ENCOUNTER — Encounter: Payer: Self-pay | Admitting: Family Medicine

## 2018-07-27 ENCOUNTER — Other Ambulatory Visit: Payer: Self-pay

## 2018-07-27 ENCOUNTER — Ambulatory Visit (INDEPENDENT_AMBULATORY_CARE_PROVIDER_SITE_OTHER): Payer: Medicare Other | Admitting: Family Medicine

## 2018-07-27 VITALS — BP 143/80 | HR 101 | Temp 98.1°F | Resp 18 | Ht 70.0 in | Wt 202.2 lb

## 2018-07-27 DIAGNOSIS — Z940 Kidney transplant status: Secondary | ICD-10-CM

## 2018-07-27 DIAGNOSIS — I509 Heart failure, unspecified: Secondary | ICD-10-CM | POA: Diagnosis not present

## 2018-07-27 DIAGNOSIS — I10 Essential (primary) hypertension: Secondary | ICD-10-CM | POA: Diagnosis not present

## 2018-07-27 DIAGNOSIS — E118 Type 2 diabetes mellitus with unspecified complications: Secondary | ICD-10-CM

## 2018-07-27 LAB — POCT URINALYSIS DIP (CLINITEK)
Bilirubin, UA: NEGATIVE
Glucose, UA: NEGATIVE mg/dL
Ketones, POC UA: NEGATIVE mg/dL
Leukocytes, UA: NEGATIVE
Nitrite, UA: NEGATIVE
POC PROTEIN,UA: 100 — AB
Spec Grav, UA: 1.015 (ref 1.010–1.025)
Urobilinogen, UA: 2 E.U./dL — AB
pH, UA: 6.5 (ref 5.0–8.0)

## 2018-07-27 LAB — POCT GLYCOSYLATED HEMOGLOBIN (HGB A1C): Hemoglobin A1C: 6.4 % — AB (ref 4.0–5.6)

## 2018-07-27 LAB — GLUCOSE, POCT (MANUAL RESULT ENTRY): POC Glucose: 95 mg/dl (ref 70–99)
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
## 2018-07-27 NOTE — Patient Instructions (Addendum)
Take your insulin 25 units in the AM and 25 units in the PM.   Diabetes Mellitus and Standards of Medical Care Managing diabetes (diabetes mellitus) can be complicated. Your diabetes treatment may be managed by a team of health care providers, including:  A physician who specializes in diabetes (endocrinologist).  A nurse practitioner or physician assistant.  Nurses.  A diet and nutrition specialist (registered dietitian).  A certified diabetes educator (CDE).  An exercise specialist.  A pharmacist.  An eye doctor.  A foot specialist (podiatrist).  A dentist.  A primary care provider.  A mental health provider. Your health care providers follow guidelines to help you get the best quality of care. The following schedule is a general guideline for your diabetes management plan. Your health care providers may give you more specific instructions. Physical exams Upon being diagnosed with diabetes mellitus, and each year after that, your health care provider will ask about your medical and family history. He or she will also do a physical exam. Your exam may include:  Measuring your height, weight, and body mass index (BMI).  Checking your blood pressure. This will be done at every routine medical visit. Your target blood pressure may vary depending on your medical conditions, your age, and other factors.  Thyroid gland exam.  Skin exam.  Screening for damage to your nerves (peripheral neuropathy). This may include checking the pulse in your legs and feet and checking the level of sensation in your hands and feet.  A complete foot exam to inspect the structure and skin of your feet, including checking for cuts, bruises, redness, blisters, sores, or other problems.  Screening for blood vessel (vascular) problems, which may include checking the pulse in your legs and feet and checking your temperature. Blood tests Depending on your treatment plan and your personal needs, you may  have the following tests done:  HbA1c (hemoglobin A1c). This test provides information about blood sugar (glucose) control over the previous 2-3 months. It is used to adjust your treatment plan, if needed. This test will be done: ? At least 2 times a year, if you are meeting your treatment goals. ? 4 times a year, if you are not meeting your treatment goals or if treatment goals have changed.  Lipid testing, including total, LDL, and HDL cholesterol and triglyceride levels. ? The goal for LDL is less than 100 mg/dL (5.5 mmol/L). If you are at high risk for complications, the goal is less than 70 mg/dL (3.9 mmol/L). ? The goal for HDL is 40 mg/dL (2.2 mmol/L) or higher for men and 50 mg/dL (2.8 mmol/L) or higher for women. An HDL cholesterol of 60 mg/dL (3.3 mmol/L) or higher gives some protection against heart disease. ? The goal for triglycerides is less than 150 mg/dL (8.3 mmol/L).  Liver function tests.  Kidney function tests.  Thyroid function tests. Dental and eye exams  Visit your dentist two times a year.  If you have type 1 diabetes, your health care provider may recommend an eye exam 3-5 years after you are diagnosed, and then once a year after your first exam. ? For children with type 1 diabetes, a health care provider may recommend an eye exam when your child is age 4 or older and has had diabetes for 3-5 years. After the first exam, your child should get an eye exam once a year.  If you have type 2 diabetes, your health care provider may recommend an eye exam as soon as  you are diagnosed, and then once a year after your first exam. Immunizations   The yearly flu (influenza) vaccine is recommended for everyone 6 months or older who has diabetes.  The pneumonia (pneumococcal) vaccine is recommended for everyone 2 years or older who has diabetes. If you are 64 or older, you may get the pneumonia vaccine as a series of two separate shots.  The hepatitis B vaccine is  recommended for adults shortly after being diagnosed with diabetes.  Adults and children with diabetes should receive all other vaccines according to age-specific recommendations from the Centers for Disease Control and Prevention (CDC). Mental and emotional health Screening for symptoms of eating disorders, anxiety, and depression is recommended at the time of diagnosis and afterward as needed. If your screening shows that you have symptoms (positive screening result), you may need more evaluation and you may work with a mental health care provider. Treatment plan Your treatment plan will be reviewed at every medical visit. You and your health care provider will discuss:  How you are taking your medicines, including insulin.  Any side effects you are experiencing.  Your blood glucose target goals.  The frequency of your blood glucose monitoring.  Lifestyle habits, such as activity level as well as tobacco, alcohol, and substance use. Diabetes self-management education Your health care provider will assess how well you are monitoring your blood glucose levels and whether you are taking your insulin correctly. He or she may refer you to:  A certified diabetes educator to manage your diabetes throughout your life, starting at diagnosis.  A registered dietitian who can create or review your personal nutrition plan.  An exercise specialist who can discuss your activity level and exercise plan. Summary  Managing diabetes (diabetes mellitus) can be complicated. Your diabetes treatment may be managed by a team of health care providers.  Your health care providers follow guidelines in order to help you get the best quality of care.  Standards of care including having regular physical exams, blood tests, blood pressure monitoring, immunizations, screening tests, and education about how to manage your diabetes.  Your health care providers may also give you more specific instructions based on  your individual health. This information is not intended to replace advice given to you by your health care provider. Make sure you discuss any questions you have with your health care provider. Document Released: 02/21/2009 Document Revised: 01/13/2018 Document Reviewed: 01/23/2016 Elsevier Interactive Patient Education  2019 [employer].

## 2018-08-16 ENCOUNTER — Encounter: Payer: Self-pay | Admitting: Pulmonary Disease

## 2018-08-30 ENCOUNTER — Telehealth: Payer: Self-pay | Admitting: Pulmonary Disease

## 2018-08-30 ENCOUNTER — Encounter: Payer: Self-pay | Admitting: *Deleted

## 2018-08-30 NOTE — Telephone Encounter (Signed)
Sleep studies at sleep center as well as HSTs in office are currently on hold due to COVID-19.  Attempted to call pt x2 but each time the line rang and rang and no machine ever kicked in. Will try to call back later.

## 2018-08-30 NOTE — Telephone Encounter (Signed)
I have sent a my chart message about scheduling of sleep studies and COVID-19. Will keep message open to see if patient calls back.

## 2018-08-31 ENCOUNTER — Ambulatory Visit: Payer: Medicare Other | Admitting: Pulmonary Disease

## 2018-08-31 NOTE — Telephone Encounter (Signed)
Pt has not responded to [employer]  ATC pt, no answer and I could not leave a message.

## 2018-09-01 NOTE — Telephone Encounter (Signed)
Attempted to contact patient, unable to leave message. Patient still has not responded to the my chart message. Will send a letter and close message. Letter printed and mailed. Nothing further is needed at this time.

## 2018-09-05 ENCOUNTER — Other Ambulatory Visit: Payer: Self-pay | Admitting: Family Medicine

## 2018-09-07 ENCOUNTER — Ambulatory Visit: Payer: Medicare Other | Admitting: Pulmonary Disease

## 2018-10-06 ENCOUNTER — Other Ambulatory Visit: Payer: Self-pay | Admitting: Family Medicine

## 2018-10-06 DIAGNOSIS — I509 Heart failure, unspecified: Secondary | ICD-10-CM

## 2018-11-02 ENCOUNTER — Telehealth: Payer: Self-pay

## 2018-11-02 NOTE — Telephone Encounter (Signed)
Called to do COVID Screening for appointment tomorrow. No answer. Left a message to call back. Thanks!

## 2018-11-03 ENCOUNTER — Ambulatory Visit: Payer: Medicare Other | Admitting: Family Medicine

## 2018-11-09 ENCOUNTER — Ambulatory Visit (INDEPENDENT_AMBULATORY_CARE_PROVIDER_SITE_OTHER): Payer: Medicare Other | Admitting: Family Medicine

## 2018-11-09 ENCOUNTER — Other Ambulatory Visit: Payer: Self-pay

## 2018-11-09 VITALS — BP 145/79 | HR 77 | Temp 98.8°F | Resp 16 | Ht 70.0 in | Wt 214.0 lb

## 2018-11-09 DIAGNOSIS — E118 Type 2 diabetes mellitus with unspecified complications: Secondary | ICD-10-CM

## 2018-11-09 DIAGNOSIS — Z23 Encounter for immunization: Secondary | ICD-10-CM

## 2018-11-09 DIAGNOSIS — Z1211 Encounter for screening for malignant neoplasm of colon: Secondary | ICD-10-CM | POA: Diagnosis not present

## 2018-11-09 DIAGNOSIS — I10 Essential (primary) hypertension: Secondary | ICD-10-CM

## 2018-11-09 LAB — POCT URINALYSIS DIPSTICK
Bilirubin, UA: NEGATIVE
Glucose, UA: NEGATIVE
Ketones, UA: NEGATIVE
Leukocytes, UA: NEGATIVE
Nitrite, UA: NEGATIVE
Protein, UA: POSITIVE — AB
Spec Grav, UA: 1.02 (ref 1.010–1.025)
Urobilinogen, UA: 0.2 E.U./dL
pH, UA: 5.5 (ref 5.0–8.0)

## 2018-11-09 LAB — GLUCOSE, POCT (MANUAL RESULT ENTRY): POC Glucose: 129 mg/dl — AB (ref 70–99)

## 2018-11-09 NOTE — Patient Instructions (Signed)
Diabetes Mellitus and Foot Care Foot care is an important part of your health, especially when you have diabetes. Diabetes may cause you to have problems because of poor blood flow (circulation) to your feet and legs, which can cause your skin to:  Become thinner and drier.  Break more easily.  Heal more slowly.  Peel and crack. You may also have nerve damage (neuropathy) in your legs and feet, causing decreased feeling in them. This means that you may not notice minor injuries to your feet that could lead to more serious problems. Noticing and addressing any potential problems early is the best way to prevent future foot problems. How to care for your feet Foot hygiene  Wash your feet daily with warm water and mild soap. Do not use hot water. Then, pat your feet and the areas between your toes until they are completely dry. Do not soak your feet as this can dry your skin.  Trim your toenails straight across. Do not dig under them or around the cuticle. File the edges of your nails with an emery board or nail file.  Apply a moisturizing lotion or petroleum jelly to the skin on your feet and to dry, brittle toenails. Use lotion that does not contain alcohol and is unscented. Do not apply lotion between your toes. Shoes and socks  Wear clean socks or stockings every day. Make sure they are not too tight. Do not wear knee-high stockings since they may decrease blood flow to your legs.  Wear shoes that fit properly and have enough cushioning. Always look in your shoes before you put them on to be sure there are no objects inside.  To break in new shoes, wear them for just a few hours a day. This prevents injuries on your feet. Wounds, scrapes, corns, and calluses  Check your feet daily for blisters, cuts, bruises, sores, and redness. If you cannot see the bottom of your feet, use a mirror or ask someone for help.  Do not cut corns or calluses or try to remove them with medicine.  If you  find a minor scrape, cut, or break in the skin on your feet, keep it and the skin around it clean and dry. You may clean these areas with mild soap and water. Do not clean the area with peroxide, alcohol, or iodine.  If you have a wound, scrape, corn, or callus on your foot, look at it several times a day to make sure it is healing and not infected. Check for: ? Redness, swelling, or pain. ? Fluid or blood. ? Warmth. ? Pus or a bad smell. General instructions  Do not cross your legs. This may decrease blood flow to your feet.  Do not use heating pads or hot water bottles on your feet. They may burn your skin. If you have lost feeling in your feet or legs, you may not know this is happening until it is too late.  Protect your feet from hot and cold by wearing shoes, such as at the beach or on hot pavement.  Schedule a complete foot exam at least once a year (annually) or more often if you have foot problems. If you have foot problems, report any cuts, sores, or bruises to your health care provider immediately. Contact a health care provider if:  You have a medical condition that increases your risk of infection and you have any cuts, sores, or bruises on your feet.  You have an injury that is not   healing.  You have redness on your legs or feet.  You feel burning or tingling in your legs or feet.  You have pain or cramps in your legs and feet.  Your legs or feet are numb.  Your feet always feel cold.  You have pain around a toenail. Get help right away if:  You have a wound, scrape, corn, or callus on your foot and: ? You have pain, swelling, or redness that gets worse. ? You have fluid or blood coming from the wound, scrape, corn, or callus. ? Your wound, scrape, corn, or callus feels warm to the touch. ? You have pus or a bad smell coming from the wound, scrape, corn, or callus. ? You have a fever. ? You have a red line going up your leg. Summary  Check your feet every day  for cuts, sores, red spots, swelling, and blisters.  Moisturize feet and legs daily.  Wear shoes that fit properly and have enough cushioning.  If you have foot problems, report any cuts, sores, or bruises to your health care provider immediately.  Schedule a complete foot exam at least once a year (annually) or more often if you have foot problems. This information is not intended to replace advice given to you by your health care provider. Make sure you discuss any questions you have with your health care provider. Document Released: 04/23/2000 Document Revised: 06/08/2017 Document Reviewed: 05/28/2016 Elsevier Patient Education  2020 Elsevier Inc.  
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
## 2019-01-02 DIAGNOSIS — Z940 Kidney transplant status: Secondary | ICD-10-CM | POA: Diagnosis not present

## 2019-01-02 DIAGNOSIS — E785 Hyperlipidemia, unspecified: Secondary | ICD-10-CM | POA: Diagnosis not present

## 2019-01-02 DIAGNOSIS — G8929 Other chronic pain: Secondary | ICD-10-CM | POA: Diagnosis not present

## 2019-01-02 DIAGNOSIS — Z79899 Other long term (current) drug therapy: Secondary | ICD-10-CM | POA: Diagnosis not present

## 2019-01-02 DIAGNOSIS — I272 Pulmonary hypertension, unspecified: Secondary | ICD-10-CM | POA: Diagnosis not present

## 2019-01-02 DIAGNOSIS — M25569 Pain in unspecified knee: Secondary | ICD-10-CM | POA: Diagnosis not present

## 2019-01-02 DIAGNOSIS — I151 Hypertension secondary to other renal disorders: Secondary | ICD-10-CM | POA: Diagnosis not present

## 2019-01-02 DIAGNOSIS — N2889 Other specified disorders of kidney and ureter: Secondary | ICD-10-CM | POA: Diagnosis not present

## 2019-01-02 DIAGNOSIS — I4892 Unspecified atrial flutter: Secondary | ICD-10-CM | POA: Diagnosis not present

## 2019-01-02 DIAGNOSIS — E119 Type 2 diabetes mellitus without complications: Secondary | ICD-10-CM | POA: Diagnosis not present

## 2019-01-02 DIAGNOSIS — Z1159 Encounter for screening for other viral diseases: Secondary | ICD-10-CM | POA: Diagnosis not present

## 2019-01-09 DIAGNOSIS — E559 Vitamin D deficiency, unspecified: Secondary | ICD-10-CM

## 2019-01-09 DIAGNOSIS — R7989 Other specified abnormal findings of blood chemistry: Secondary | ICD-10-CM

## 2019-01-09 DIAGNOSIS — R809 Proteinuria, unspecified: Secondary | ICD-10-CM

## 2019-01-09 HISTORY — DX: Vitamin D deficiency, unspecified: E55.9

## 2019-01-09 HISTORY — DX: Proteinuria, unspecified: R80.9

## 2019-01-09 HISTORY — DX: Other specified abnormal findings of blood chemistry: R79.89

## 2019-01-17 ENCOUNTER — Encounter (HOSPITAL_COMMUNITY): Payer: Self-pay

## 2019-01-17 ENCOUNTER — Encounter (HOSPITAL_COMMUNITY): Payer: Self-pay | Admitting: *Deleted

## 2019-01-29 ENCOUNTER — Inpatient Hospital Stay (HOSPITAL_COMMUNITY)
Admission: EM | Admit: 2019-01-29 | Discharge: 2019-02-05 | DRG: 917 | Disposition: A | Discharge status: Home / Self Care | Payer: Medicare Other | Attending: Internal Medicine | Admitting: Internal Medicine

## 2019-01-29 ENCOUNTER — Emergency Department (HOSPITAL_COMMUNITY): Payer: Medicare Other

## 2019-01-29 DIAGNOSIS — E1129 Type 2 diabetes mellitus with other diabetic kidney complication: Secondary | ICD-10-CM | POA: Diagnosis not present

## 2019-01-29 DIAGNOSIS — T40604A Poisoning by unspecified narcotics, undetermined, initial encounter: Secondary | ICD-10-CM | POA: Diagnosis not present

## 2019-01-29 DIAGNOSIS — T50904A Poisoning by unspecified drugs, medicaments and biological substances, undetermined, initial encounter: Secondary | ICD-10-CM | POA: Diagnosis not present

## 2019-01-29 DIAGNOSIS — K5900 Constipation, unspecified: Secondary | ICD-10-CM | POA: Diagnosis not present

## 2019-01-29 DIAGNOSIS — I10 Essential (primary) hypertension: Secondary | ICD-10-CM | POA: Diagnosis present

## 2019-01-29 DIAGNOSIS — Z79899 Other long term (current) drug therapy: Secondary | ICD-10-CM | POA: Diagnosis not present

## 2019-01-29 DIAGNOSIS — J690 Pneumonitis due to inhalation of food and vomit: Secondary | ICD-10-CM | POA: Diagnosis not present

## 2019-01-29 DIAGNOSIS — I482 Chronic atrial fibrillation, unspecified: Secondary | ICD-10-CM | POA: Diagnosis present

## 2019-01-29 DIAGNOSIS — T40601A Poisoning by unspecified narcotics, accidental (unintentional), initial encounter: Secondary | ICD-10-CM | POA: Diagnosis present

## 2019-01-29 DIAGNOSIS — T403X1A Poisoning by methadone, accidental (unintentional), initial encounter: Secondary | ICD-10-CM | POA: Diagnosis not present

## 2019-01-29 DIAGNOSIS — R0902 Hypoxemia: Secondary | ICD-10-CM | POA: Diagnosis not present

## 2019-01-29 DIAGNOSIS — N179 Acute kidney failure, unspecified: Secondary | ICD-10-CM | POA: Diagnosis present

## 2019-01-29 DIAGNOSIS — N183 Chronic kidney disease, stage 3 unspecified: Secondary | ICD-10-CM | POA: Diagnosis present

## 2019-01-29 DIAGNOSIS — E1122 Type 2 diabetes mellitus with diabetic chronic kidney disease: Secondary | ICD-10-CM | POA: Diagnosis present

## 2019-01-29 DIAGNOSIS — I5032 Chronic diastolic (congestive) heart failure: Secondary | ICD-10-CM | POA: Diagnosis not present

## 2019-01-29 DIAGNOSIS — E785 Hyperlipidemia, unspecified: Secondary | ICD-10-CM | POA: Diagnosis present

## 2019-01-29 DIAGNOSIS — IMO0002 Reserved for concepts with insufficient information to code with codable children: Secondary | ICD-10-CM | POA: Diagnosis present

## 2019-01-29 DIAGNOSIS — I160 Hypertensive urgency: Secondary | ICD-10-CM | POA: Diagnosis not present

## 2019-01-29 DIAGNOSIS — R0602 Shortness of breath: Secondary | ICD-10-CM | POA: Diagnosis not present

## 2019-01-29 DIAGNOSIS — Z7982 Long term (current) use of aspirin: Secondary | ICD-10-CM | POA: Diagnosis not present

## 2019-01-29 DIAGNOSIS — Z7901 Long term (current) use of anticoagulants: Secondary | ICD-10-CM | POA: Diagnosis not present

## 2019-01-29 DIAGNOSIS — Z905 Acquired absence of kidney: Secondary | ICD-10-CM | POA: Diagnosis not present

## 2019-01-29 DIAGNOSIS — E11649 Type 2 diabetes mellitus with hypoglycemia without coma: Secondary | ICD-10-CM | POA: Diagnosis not present

## 2019-01-29 DIAGNOSIS — K219 Gastro-esophageal reflux disease without esophagitis: Secondary | ICD-10-CM | POA: Diagnosis present

## 2019-01-29 DIAGNOSIS — R4020 Unspecified coma: Secondary | ICD-10-CM | POA: Diagnosis not present

## 2019-01-29 DIAGNOSIS — Z940 Kidney transplant status: Secondary | ICD-10-CM | POA: Diagnosis not present

## 2019-01-29 DIAGNOSIS — Y830 Surgical operation with transplant of whole organ as the cause of abnormal reaction of the patient, or of later complication, without mention of misadventure at the time of the procedure: Secondary | ICD-10-CM | POA: Diagnosis present

## 2019-01-29 DIAGNOSIS — J90 Pleural effusion, not elsewhere classified: Secondary | ICD-10-CM | POA: Diagnosis not present

## 2019-01-29 DIAGNOSIS — I130 Hypertensive heart and chronic kidney disease with heart failure and stage 1 through stage 4 chronic kidney disease, or unspecified chronic kidney disease: Secondary | ICD-10-CM | POA: Diagnosis not present

## 2019-01-29 DIAGNOSIS — Z20828 Contact with and (suspected) exposure to other viral communicable diseases: Secondary | ICD-10-CM | POA: Diagnosis present

## 2019-01-29 DIAGNOSIS — T8619 Other complication of kidney transplant: Secondary | ICD-10-CM | POA: Diagnosis present

## 2019-01-29 DIAGNOSIS — Z888 Allergy status to other drugs, medicaments and biological substances status: Secondary | ICD-10-CM

## 2019-01-29 DIAGNOSIS — E1165 Type 2 diabetes mellitus with hyperglycemia: Secondary | ICD-10-CM

## 2019-01-29 DIAGNOSIS — G9340 Encephalopathy, unspecified: Secondary | ICD-10-CM | POA: Diagnosis present

## 2019-01-29 DIAGNOSIS — J9601 Acute respiratory failure with hypoxia: Secondary | ICD-10-CM | POA: Diagnosis not present

## 2019-01-29 DIAGNOSIS — T402X1A Poisoning by other opioids, accidental (unintentional), initial encounter: Secondary | ICD-10-CM | POA: Diagnosis not present

## 2019-01-29 DIAGNOSIS — Z96652 Presence of left artificial knee joint: Secondary | ICD-10-CM | POA: Diagnosis present

## 2019-01-29 DIAGNOSIS — Z79891 Long term (current) use of opiate analgesic: Secondary | ICD-10-CM

## 2019-01-29 DIAGNOSIS — Z794 Long term (current) use of insulin: Secondary | ICD-10-CM

## 2019-01-29 DIAGNOSIS — Z09 Encounter for follow-up examination after completed treatment for conditions other than malignant neoplasm: Secondary | ICD-10-CM

## 2019-01-29 DIAGNOSIS — I4891 Unspecified atrial fibrillation: Secondary | ICD-10-CM | POA: Diagnosis not present

## 2019-01-29 DIAGNOSIS — I4892 Unspecified atrial flutter: Secondary | ICD-10-CM | POA: Diagnosis present

## 2019-01-29 DIAGNOSIS — D638 Anemia in other chronic diseases classified elsewhere: Secondary | ICD-10-CM | POA: Diagnosis present

## 2019-01-29 DIAGNOSIS — Z8249 Family history of ischemic heart disease and other diseases of the circulatory system: Secondary | ICD-10-CM

## 2019-01-29 DIAGNOSIS — E860 Dehydration: Secondary | ICD-10-CM | POA: Diagnosis not present

## 2019-01-29 DIAGNOSIS — Z800 Family history of malignant neoplasm of digestive organs: Secondary | ICD-10-CM

## 2019-01-29 DIAGNOSIS — R404 Transient alteration of awareness: Secondary | ICD-10-CM | POA: Diagnosis not present

## 2019-01-29 DIAGNOSIS — R918 Other nonspecific abnormal finding of lung field: Secondary | ICD-10-CM | POA: Diagnosis not present

## 2019-01-29 LAB — ETHANOL: Alcohol, Ethyl (B): <10 mg/dL (ref <10)

## 2019-01-29 LAB — CBC WITH DIFFERENTIAL/PLATELET
Abs Immature Granulocytes: 0.03 10*3/uL (ref 0.00–0.07)
Basophils Absolute: 0 10*3/uL (ref 0.0–0.1)
Basophils Relative: 0 %
Eosinophils Absolute: 0.2 10*3/uL (ref 0.0–0.5)
Eosinophils Relative: 2 %
HCT: 48.9 % (ref 39.0–52.0)
Hemoglobin: 15.2 g/dL (ref 13.0–17.0)
Immature Granulocytes: 0 %
Lymphocytes Relative: 5 %
Lymphs Abs: 0.5 10*3/uL — ABNORMAL LOW (ref 0.7–4.0)
MCH: 27.4 pg (ref 26.0–34.0)
MCHC: 31.1 g/dL (ref 30.0–36.0)
MCV: 88.3 fL (ref 80.0–100.0)
Monocytes Absolute: 0.7 10*3/uL (ref 0.1–1.0)
Monocytes Relative: 6 %
Neutro Abs: 10 10*3/uL — ABNORMAL HIGH (ref 1.7–7.7)
Neutrophils Relative %: 87 %
Platelets: 232 10*3/uL (ref 150–400)
RBC: 5.54 MIL/uL (ref 4.22–5.81)
RDW: 15 % (ref 11.5–15.5)
WBC: 11.4 10*3/uL — ABNORMAL HIGH (ref 4.0–10.5)
nRBC: 0 % (ref 0.0–0.2)

## 2019-01-29 LAB — COMPREHENSIVE METABOLIC PANEL
ALT: 13 U/L (ref 0–44)
AST: 17 U/L (ref 15–41)
Albumin: 3.5 g/dL (ref 3.5–5.0)
Alkaline Phosphatase: 66 U/L (ref 38–126)
Anion gap: 8 (ref 5–15)
BUN: 22 mg/dL (ref 8–23)
CO2: 26 mmol/L (ref 22–32)
Calcium: 8.9 mg/dL (ref 8.9–10.3)
Chloride: 108 mmol/L (ref 98–111)
Creatinine, Ser: 1.9 mg/dL — ABNORMAL HIGH (ref 0.61–1.24)
GFR calc Af Amer: 43 mL/min — ABNORMAL LOW (ref >60)
GFR calc non Af Amer: 37 mL/min — ABNORMAL LOW (ref >60)
Glucose, Bld: 82 mg/dL (ref 70–99)
Potassium: 5 mmol/L (ref 3.5–5.1)
Sodium: 142 mmol/L (ref 135–145)
Total Bilirubin: 0.7 mg/dL (ref 0.3–1.2)
Total Protein: 6.9 g/dL (ref 6.5–8.1)

## 2019-01-29 LAB — RAPID URINE DRUG SCREEN, HOSP PERFORMED
Amphetamines: NOT DETECTED
Barbiturates: NOT DETECTED
Benzodiazepines: NOT DETECTED
Cocaine: NOT DETECTED
Opiates: POSITIVE — AB
Tetrahydrocannabinol: NOT DETECTED

## 2019-01-29 LAB — HEMOGLOBIN A1C
Hgb A1c MFr Bld: 6.4 % — ABNORMAL HIGH (ref 4.8–5.6)
Mean Plasma Glucose: 136.98 mg/dL

## 2019-01-29 LAB — LIPASE, BLOOD: Lipase: 35 U/L (ref 11–51)

## 2019-01-29 LAB — GLUCOSE, CAPILLARY
Glucose-Capillary: 42 mg/dL — CL (ref 70–99)
Glucose-Capillary: 131 mg/dL — ABNORMAL HIGH (ref 70–99)

## 2019-01-29 LAB — SARS CORONAVIRUS 2 BY RT PCR (HOSPITAL ORDER, PERFORMED IN ~~LOC~~ HOSPITAL LAB): SARS Coronavirus 2: NEGATIVE

## 2019-01-29 MED ORDER — INSULIN ASPART PROT & ASPART (70-30 MIX) 100 UNIT/ML ~~LOC~~ SUSP: 25.0000 [IU] | Freq: Every day | SUBCUTANEOUS | Status: DC

## 2019-01-29 MED ORDER — ALBUTEROL SULFATE HFA 108 (90 BASE) MCG/ACT IN AERS: 2.0000 | INHALATION_SPRAY | Freq: Four times a day (QID) | RESPIRATORY_TRACT | Status: DC | PRN

## 2019-01-29 MED ORDER — MAGNESIUM OXIDE 400 (241.3 MG) MG PO TABS
400.0000 mg | ORAL_TABLET | Freq: Every day | ORAL | Status: DC
  Administered 2019-01-30 – 2019-02-05 (×7): 400 mg via ORAL
  Filled 2019-01-29 (×8): qty 1

## 2019-01-29 MED ORDER — ACETAMINOPHEN 325 MG PO TABS: 650.0000 mg | ORAL_TABLET | Freq: Four times a day (QID) | ORAL | Status: DC | PRN

## 2019-01-29 MED ORDER — ACETAMINOPHEN 650 MG RE SUPP: 650.0000 mg | Freq: Four times a day (QID) | RECTAL | Status: DC | PRN

## 2019-01-29 MED ORDER — ASPIRIN EC 81 MG PO TBEC
81.0000 mg | DELAYED_RELEASE_TABLET | Freq: Every day | ORAL | Status: DC
  Administered 2019-01-29 – 2019-02-05 (×8): 81 mg via ORAL
  Filled 2019-01-29 (×7): qty 1

## 2019-01-29 MED ORDER — APIXABAN 5 MG PO TABS
5.0000 mg | ORAL_TABLET | Freq: Two times a day (BID) | ORAL | Status: DC
  Administered 2019-01-29 – 2019-02-05 (×14): 5 mg via ORAL
  Filled 2019-01-29 (×14): qty 1

## 2019-01-29 MED ORDER — SODIUM CHLORIDE 0.9 % IV SOLN
3.0000 g | Freq: Once | INTRAVENOUS | Status: AC
  Administered 2019-01-29: 3 g via INTRAVENOUS
  Filled 2019-01-29: qty 8

## 2019-01-29 MED ORDER — NIFEDIPINE ER OSMOTIC RELEASE 30 MG PO TB24
60.0000 mg | ORAL_TABLET | Freq: Every day | ORAL | Status: DC
  Administered 2019-01-29 – 2019-02-05 (×8): 60 mg via ORAL
  Filled 2019-01-29 (×7): qty 2

## 2019-01-29 MED ORDER — CARVEDILOL 25 MG PO TABS
25.0000 mg | ORAL_TABLET | Freq: Two times a day (BID) | ORAL | Status: DC
  Administered 2019-01-29 – 2019-02-05 (×14): 25 mg via ORAL
  Filled 2019-01-29 (×13): qty 1

## 2019-01-29 MED ORDER — INSULIN ASPART PROT & ASPART (70-30 MIX) 100 UNIT/ML ~~LOC~~ SUSP
45.0000 [IU] | Freq: Every day | SUBCUTANEOUS | Status: DC
  Administered 2019-01-30: 45 [IU] via SUBCUTANEOUS
  Filled 2019-01-29: qty 10

## 2019-01-29 MED ORDER — SODIUM CHLORIDE 0.9 % IV SOLN
1.5000 g | Freq: Four times a day (QID) | INTRAVENOUS | Status: DC
  Administered 2019-01-29 – 2019-02-02 (×14): 1.5 g via INTRAVENOUS
  Filled 2019-01-29 (×2): qty 4
  Filled 2019-01-29: qty 1.5
  Filled 2019-01-29 (×6): qty 4
  Filled 2019-01-29: qty 1.5
  Filled 2019-01-29 (×2): qty 4
  Filled 2019-01-29: qty 1.5
  Filled 2019-01-29: qty 4
  Filled 2019-01-29: qty 1.5
  Filled 2019-01-29 (×3): qty 4

## 2019-01-29 MED ORDER — ONDANSETRON HCL 4 MG/2ML IJ SOLN
4.0000 mg | Freq: Four times a day (QID) | INTRAMUSCULAR | Status: DC | PRN
  Administered 2019-01-29: 4 mg via INTRAVENOUS
  Filled 2019-01-29: qty 2

## 2019-01-29 MED ORDER — TACROLIMUS 1 MG PO CAPS
2.0000 mg | ORAL_CAPSULE | Freq: Two times a day (BID) | ORAL | Status: DC
  Administered 2019-01-29 – 2019-02-05 (×14): 2 mg via ORAL
  Filled 2019-01-29 (×14): qty 2

## 2019-01-29 MED ORDER — NALOXONE HCL 0.4 MG/ML IJ SOLN: 0.4000 mg | Freq: Once | INTRAMUSCULAR | Status: DC

## 2019-01-29 MED ORDER — POLYETHYLENE GLYCOL 3350 17 G PO PACK: 17.0000 g | PACK | Freq: Every day | ORAL | Status: DC | PRN

## 2019-01-29 MED ORDER — HYDRALAZINE HCL 20 MG/ML IJ SOLN: 10.0000 mg | Freq: Four times a day (QID) | INTRAMUSCULAR | Status: DC | PRN

## 2019-01-29 MED ORDER — ONDANSETRON HCL 4 MG PO TABS: 4.0000 mg | ORAL_TABLET | Freq: Four times a day (QID) | ORAL | Status: DC | PRN

## 2019-01-29 MED ORDER — METHADONE HCL 10 MG PO TABS
60.0000 mg | ORAL_TABLET | Freq: Every day | ORAL | Status: DC
  Administered 2019-02-01: 60 mg via ORAL
  Filled 2019-01-29 (×3): qty 6

## 2019-01-29 MED ORDER — PREGABALIN 100 MG PO CAPS
100.0000 mg | ORAL_CAPSULE | Freq: Three times a day (TID) | ORAL | Status: DC
  Administered 2019-01-29 – 2019-02-03 (×12): 100 mg via ORAL
  Filled 2019-01-29 (×12): qty 1

## 2019-01-29 MED ORDER — SODIUM CHLORIDE 0.9 % IV SOLN
INTRAVENOUS | Status: AC
  Administered 2019-01-29: 21:00:00 via INTRAVENOUS

## 2019-01-29 MED ORDER — PRAVASTATIN SODIUM 40 MG PO TABS
40.0000 mg | ORAL_TABLET | ORAL | Status: DC
  Administered 2019-01-29 – 2019-02-05 (×4): 40 mg via ORAL
  Filled 2019-01-29 (×6): qty 1

## 2019-01-29 MED ORDER — INSULIN ASPART PROT & ASPART (70-30 MIX) 100 UNIT/ML ~~LOC~~ SUSP: 25.0000 [IU] | SUBCUTANEOUS | Status: DC

## 2019-01-29 MED ORDER — PANTOPRAZOLE SODIUM 40 MG PO TBEC
40.0000 mg | DELAYED_RELEASE_TABLET | Freq: Every day | ORAL | Status: DC
  Administered 2019-01-29 – 2019-02-05 (×8): 40 mg via ORAL
  Filled 2019-01-29 (×7): qty 1

## 2019-01-29 MED ORDER — MYCOPHENOLATE SODIUM 180 MG PO TBEC
540.0000 mg | DELAYED_RELEASE_TABLET | Freq: Two times a day (BID) | ORAL | Status: DC
  Administered 2019-01-29 – 2019-02-05 (×14): 540 mg via ORAL
  Filled 2019-01-29 (×14): qty 3

## 2019-01-29 MED ORDER — NALOXONE HCL 2 MG/2ML IJ SOSY: 1.0000 mg | PREFILLED_SYRINGE | Freq: Once | INTRAMUSCULAR | Status: DC

## 2019-01-29 MED ORDER — ALBUTEROL SULFATE (2.5 MG/3ML) 0.083% IN NEBU
2.5000 mg | INHALATION_SOLUTION | Freq: Four times a day (QID) | RESPIRATORY_TRACT | Status: DC | PRN
  Administered 2019-02-01: 2.5 mg via RESPIRATORY_TRACT
  Filled 2019-01-29: qty 3

## 2019-01-29 NOTE — ED Triage Notes (Signed)
Per EMS: pt from home,  went to the methadone clinic today, self administered 90mg  of Methadone. Family reported to EMS pt began vomiting then became unresponsive. Upon EMS arrival pt breathing about 4 - 8 times a minute and a oxygen saturation of 42%, EMS assisted ventilations, gave 2mg  Narcan intranasal. Pt Sp02 93-94% with 6L [place].  Pt also received 4mg  Zofran IM and 4mg  Zofran IV. EMS reports pt vomited an entire emesis bag full.   EMS vitals:  158/92 CBG 138 RR26
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
## 2019-01-30 ENCOUNTER — Other Ambulatory Visit: Payer: Self-pay

## 2019-01-30 LAB — CBC
HCT: 46.8 % (ref 39.0–52.0)
Hemoglobin: 14.1 g/dL (ref 13.0–17.0)
MCH: 26.9 pg (ref 26.0–34.0)
MCHC: 30.1 g/dL (ref 30.0–36.0)
MCV: 89.1 fL (ref 80.0–100.0)
Platelets: 204 10*3/uL (ref 150–400)
RBC: 5.25 MIL/uL (ref 4.22–5.81)
RDW: 15 % (ref 11.5–15.5)
WBC: 13.8 10*3/uL — ABNORMAL HIGH (ref 4.0–10.5)
nRBC: 0 % (ref 0.0–0.2)

## 2019-01-30 LAB — GLUCOSE, CAPILLARY
Glucose-Capillary: 110 mg/dL — ABNORMAL HIGH (ref 70–99)
Glucose-Capillary: 82 mg/dL (ref 70–99)
Glucose-Capillary: 49 mg/dL — ABNORMAL LOW (ref 70–99)
Glucose-Capillary: 106 mg/dL — ABNORMAL HIGH (ref 70–99)
Glucose-Capillary: 48 mg/dL — ABNORMAL LOW (ref 70–99)
Glucose-Capillary: 55 mg/dL — ABNORMAL LOW (ref 70–99)
Glucose-Capillary: 124 mg/dL — ABNORMAL HIGH (ref 70–99)

## 2019-01-30 LAB — BASIC METABOLIC PANEL
Anion gap: 7 (ref 5–15)
BUN: 23 mg/dL (ref 8–23)
CO2: 25 mmol/L (ref 22–32)
Calcium: 8.5 mg/dL — ABNORMAL LOW (ref 8.9–10.3)
Chloride: 106 mmol/L (ref 98–111)
Creatinine, Ser: 1.95 mg/dL — ABNORMAL HIGH (ref 0.61–1.24)
GFR calc Af Amer: 41 mL/min — ABNORMAL LOW (ref >60)
GFR calc non Af Amer: 36 mL/min — ABNORMAL LOW (ref >60)
Glucose, Bld: 105 mg/dL — ABNORMAL HIGH (ref 70–99)
Potassium: 4.9 mmol/L (ref 3.5–5.1)
Sodium: 138 mmol/L (ref 135–145)

## 2019-01-30 MED ORDER — INSULIN ASPART PROT & ASPART (70-30 MIX) 100 UNIT/ML ~~LOC~~ SUSP: 18.0000 [IU] | Freq: Every day | SUBCUTANEOUS | Status: DC

## 2019-01-30 MED ORDER — INSULIN ASPART 100 UNIT/ML ~~LOC~~ SOLN
0.0000 [IU] | Freq: Three times a day (TID) | SUBCUTANEOUS | Status: DC
  Administered 2019-01-31: 2 [IU] via SUBCUTANEOUS
  Administered 2019-01-31: 1 [IU] via SUBCUTANEOUS
  Administered 2019-01-31: 2 [IU] via SUBCUTANEOUS
  Administered 2019-02-01: 1 [IU] via SUBCUTANEOUS
  Administered 2019-02-01 – 2019-02-02 (×3): 2 [IU] via SUBCUTANEOUS
  Administered 2019-02-02 (×2): 3 [IU] via SUBCUTANEOUS
  Administered 2019-02-03: 1 [IU] via SUBCUTANEOUS
  Administered 2019-02-03 – 2019-02-04 (×4): 2 [IU] via SUBCUTANEOUS
  Administered 2019-02-04: 1 [IU] via SUBCUTANEOUS
  Administered 2019-02-05: 2 [IU] via SUBCUTANEOUS

## 2019-01-30 MED ORDER — SODIUM CHLORIDE 0.9 % IV SOLN
INTRAVENOUS | Status: AC
  Administered 2019-01-30: 17:00:00 via INTRAVENOUS

## 2019-01-30 MED ORDER — INSULIN ASPART PROT & ASPART (70-30 MIX) 100 UNIT/ML ~~LOC~~ SUSP: 30.0000 [IU] | Freq: Every day | SUBCUTANEOUS | Status: DC

## 2019-01-30 NOTE — Progress Notes (Signed)
Pt CBG 42,hypogycemia protocol started,pt was given 8 oz of OJ and crackers, dinner ordered, CBG was rechecked 15 mins later

## 2019-01-30 NOTE — Progress Notes (Signed)
Inpatient Diabetes Program Recommendations  AACE/ADA: New Consensus Statement on Inpatient Glycemic Control (2015)  Target Ranges:  Prepandial:   less than 140 mg/dL      Peak postprandial:   less than 180 mg/dL (1-2 hours)      Critically ill patients:  140 - 180 mg/dL   Lab Results  Component Value Date   GLUCAP 82 01/30/2019   HGBA1C 6.4 (H) 01/29/2019    Review of Glycemic Control Results for THADE, SLEDZ (MRN [id number]) as of 01/30/2019 12:51  Ref. Range 01/29/2019 20:24 01/29/2019 23:04 01/30/2019 07:38 01/30/2019 11:52  Glucose-Capillary Latest Ref Range: 70 - 99 mg/dL 42 (LL) 131 (H) 110 (H) 82   Diabetes history: DM 2 Outpatient Diabetes medications:  Novolog 70/30 45 units q AM and 25 units with supper Current orders for Inpatient glycemic control:  Novolog 70/30 45 units q AM and 25 units with supper  Inpatient Diabetes Program Recommendations:    Please consider reducing Novolog 70/30- 30 units q AM and Novolog 70/30-18 units q PM.   Thanks,  Adah Perl, RN, BC-ADM Inpatient Diabetes Coordinator Pager [phone] (8a-5p)

## 2019-01-30 NOTE — Progress Notes (Signed)
PROGRESS NOTE    [name]  [id number] DOB: 11-Jun-1955 DOA: 01/29/2019 PCP: Lanae Boast, FNP   Brief Narrative: As per H&P:63 y.o. male with medical history significant of past medical history of chronic kidney disease stage III (with a baseline creatinine around 1.5) status post transplant more than 5 years ago acutely on tacrolimus, previous admission for accidental methadone overdose, chronic diastolic heart failure, chronic atrial fibrillation on apixaban essential hypertension and diabetes mellitus poorly controlled insulin-dependent, on chronic methadone program who went to the clinic this morning took his prescribe  90 mg of methadone,,  the patient relates that he has not been taking his methadone for over a week as he wants to get off it, was found several hours later by family members in the bathroom unresponsive, EMS was called and he is was breathing about 4 times per minute his saturations were 40s, he was given Narcan intranasally placed on a nonrebreather he suddenly woke up his saturations improved, he was brought down to 6 L satting 93%. He was then given intramuscular Zofran in route as he developed emesis.  In ER: bit sleepy satting 95% on 4 L, new acute kidney injury with a creatinine of 1.9, mild leukocytosis with a left shift, have personally reviewed his chest x-ray which shows a prominent on the right than on the left, UDS was positive for opiates alcohol level was less than 10, SARS-CoV-2 was negative.  He was also started on a IV Unasyn for possible aspiration pneumonia.  Subjective: Resting well, some shortnes of breaht ,alert,awake,on 5l Laurel Padmanabhan.  Assessment & Plan:  Acute respiratory failure with hypoxia: Secondary to methadone overdose, aspiration pneumonia.  Still needing supplemental oxygen, continue IV antibiotics encourage ambulation, pulmonary toileting and wean oxygen slowly.  Aspiration pneumonia: In the setting of unintentional methadone overdose.  He  is alert awake oriented.  Continue on Unasyn, pulmonary toileting supplemental oxygen.  White counts  bumped to 13 K from 11 K.  Overall afebrile hemodynamically stable.  Acute encephalopathy/Unintentional methadone overdose: Patient is alert awake, much improved.  Monitor closely.  He is going to follow-up with his pain clinic regarding weaning him from methadone as he was trying to do it by himself and was not taking for a week when he decided to take again.  Resume methadone cautiously.   DM , type 2, uncontrolled: Insulin-dependent.  With hypoglycemia blood sugar 1 was 42 yesterday.  Overall stable.  Will cut down his insulin 70/30 to 18 u p.m. and 30 u a.m. Recent Labs  Lab 01/29/19 2024 01/29/19 2304 01/30/19 0738 01/30/19 1152  GLUCAP 42* 131* 110* 82   Accelerated hypertension: Blood pressure fairly controlled.  Continue Coreg, nifedipine.  Clonidine 0.3 mg, Lasix on hold.  AKI / Kidney transplant recipient/ CKD stage III baseline creatinine around 1.5: Creatinine remains up at 1.9 stable.  Continue gentle fluids hold Lasix and monitor.  On tacrolimus Recent Labs  Lab 01/29/19 1624 01/30/19 0557  BUN 22 23  CREATININE 1.90* 1.95*    Atrial fibrillation, chronic: Rate controlled on Coreg and Eliquis.  Chronic diastolic heart failure: Appears euvolemic still with AKI, holding Lasix.  Monitor closely.  Body mass index is 31.57 kg/m.   DVT prophylaxis: ELIQUIS Code Status: FULL Family Communication: plan of care discussed with patient in detail Disposition Plan: Remains inpatient pending improvement in his hypoxia, pneumonia   Consultants: NONE  Procedures: CXR: Mild patchy airspace opacities in the bilateral mid and lower lungs may reflect atelectasis and/or pneumonia.  Microbiology: Covid 19 neg  Antimicrobials: Anti-infectives (From admission, onward)   Start     Dose/Rate Route Frequency Ordered Stop   01/30/19 0000  ampicillin-sulbactam (UNASYN) 1.5 g in sodium  chloride 0.9 % 100 mL IVPB     1.5 g 200 mL/hr over 30 Minutes Intravenous Every 6 hours 01/29/19 2016     01/29/19 1800  Ampicillin-Sulbactam (UNASYN) 3 g in sodium chloride 0.9 % 100 mL IVPB     3 g 200 mL/hr over 30 Minutes Intravenous  Once 01/29/19 1746 01/29/19 1930       Objective: Vitals:   01/29/19 2302 01/29/19 2316 01/30/19 0311 01/30/19 0741  BP: (!) 161/116  (!) 150/96 (!) 156/101  Pulse: 99  90 93  Resp:   18 19  Temp: 97.8 F (36.6 C)   98.4 F (36.9 C)  TempSrc: Oral   Oral  SpO2: (!) 82% 93%  97%  Weight:   99.8 kg   Height:        Intake/Output Summary (Last 24 hours) at 01/30/2019 0833 Last data filed at 01/30/2019 0830 Gross per 24 hour  Intake 1980 ml  Output 400 ml  Net 1580 ml   Filed Weights   01/30/19 0311  Weight: 99.8 kg   Weight change:   Body mass index is 31.57 kg/m.  Intake/Output from previous day: 09/21 0701 - 09/22 0700 In: 1980 [P.O.:360; I.V.:1420; IV Piggyback:200] Out: 200 [Emesis/NG output:200] Intake/Output this shift: Total I/O In: -  Out: 200 [Emesis/NG output:200]  Examination:  General exam: Appears calm and comfortable,Not in distress, older fore the age HEENT:PERRL,Oral mucosa moist, Ear/Nose normal on gross exam Respiratory system: Bilateral basal crackles, fair air entry present. Cardiovascular system: S1 & S2 heard,No JVD, murmurs. Gastrointestinal system: Abdomen is  soft, non tender, non distended, BS +  Nervous System:Alert and oriented. No focal neurological deficits/moving extremities, sensation intact. Extremities: b/l leg edema, no clubbing, distal peripheral pulses palpable. Skin: No rashes, lesions, no icterus MSK: Normal muscle bulk,tone ,power  Medications:  Scheduled Meds: . apixaban  5 mg Oral BID  . aspirin EC  81 mg Oral Daily  . carvedilol  25 mg Oral BID WC  . insulin aspart protamine- aspart  45 Units Subcutaneous Q breakfast   And  . insulin aspart protamine- aspart  25 Units  Subcutaneous Q supper  . magnesium oxide  400 mg Oral Daily  . [START ON 01/31/2019] methadone  60 mg Oral Daily  . mycophenolate  540 mg Oral BID  . NIFEdipine  60 mg Oral Daily  . pantoprazole  40 mg Oral Daily  . pravastatin  40 mg Oral 3 times weekly  . pregabalin  100 mg Oral TID  . tacrolimus  2 mg Oral BID   Continuous Infusions: . ampicillin-sulbactam (UNASYN) IV 1.5 g (01/30/19 0526)    Data Reviewed: I have personally reviewed following labs and imaging studies  CBC: Recent Labs  Lab 01/29/19 1624 01/30/19 0557  WBC 11.4* 13.8*  NEUTROABS 10.0*  --   HGB 15.2 14.1  HCT 48.9 46.8  MCV 88.3 89.1  PLT 232 0000000   Basic Metabolic Panel: Recent Labs  Lab 01/29/19 1624 01/30/19 0557  NA 142 138  K 5.0 4.9  CL 108 106  CO2 26 25  GLUCOSE 82 105*  BUN 22 23  CREATININE 1.90* 1.95*  CALCIUM 8.9 8.5*   GFR: Estimated Creatinine Clearance: 45.9 mL/min (A) (by C-G formula based on SCr of 1.95 mg/dL (H)). Liver  Function Tests: Recent Labs  Lab 01/29/19 1624  AST 17  ALT 13  ALKPHOS 66  BILITOT 0.7  PROT 6.9  ALBUMIN 3.5   Recent Labs  Lab 01/29/19 1624  LIPASE 35   No results for input(s): AMMONIA in the last 168 hours. Coagulation Profile: No results for input(s): INR, PROTIME in the last 168 hours. Cardiac Enzymes: No results for input(s): CKTOTAL, CKMB, CKMBINDEX, TROPONINI in the last 168 hours. BNP (last 3 results) No results for input(s): PROBNP in the last 8760 hours. HbA1C: Recent Labs    01/29/19 2101  HGBA1C 6.4*   CBG: Recent Labs  Lab 01/29/19 2024 01/29/19 2304 01/30/19 0738  GLUCAP 42* 131* 110*   Lipid Profile: No results for input(s): CHOL, HDL, LDLCALC, TRIG, CHOLHDL, LDLDIRECT in the last 72 hours. Thyroid Function Tests: No results for input(s): TSH, T4TOTAL, FREET4, T3FREE, THYROIDAB in the last 72 hours. Anemia Panel: No results for input(s): VITAMINB12, FOLATE, FERRITIN, TIBC, IRON, RETICCTPCT in the last 72 hours.  Sepsis Labs: No results for input(s): PROCALCITON, LATICACIDVEN in the last 168 hours.  Recent Results (from the past 240 hour(s))  SARS Coronavirus 2 [facility] order, Performed in [facility] hospital lab) Nasopharyngeal Nasopharyngeal Swab     Status: None   Collection Time: 01/29/19  3:17 PM   Specimen: Nasopharyngeal Swab  Result Value Ref Range Status   SARS Coronavirus 2 NEGATIVE NEGATIVE Final    Comment: (NOTE) If result is NEGATIVE SARS-CoV-2 target nucleic acids are NOT DETECTED. The SARS-CoV-2 RNA is generally detectable in upper and lower  respiratory specimens during the acute phase of infection. The lowest  concentration of SARS-CoV-2 viral copies this assay can detect is 250  copies / mL. A negative result does not preclude SARS-CoV-2 infection  and should not be used as the sole basis for treatment or other  patient management decisions.  A negative result may occur with  improper specimen collection / handling, submission of specimen other  than nasopharyngeal swab, presence of viral mutation(s) within the  areas targeted by this assay, and inadequate number of viral copies  (<250 copies / mL). A negative result must be combined with clinical  observations, patient history, and epidemiological information. If result is POSITIVE SARS-CoV-2 target nucleic acids are DETECTED. The SARS-CoV-2 RNA is generally detectable in upper and lower  respiratory specimens dur ing the acute phase of infection.  Positive  results are indicative of active infection with SARS-CoV-2.  Clinical  correlation with patient history and other diagnostic information is  necessary to determine patient infection status.  Positive results do  not rule out bacterial infection or co-infection with other viruses. If result is PRESUMPTIVE POSTIVE SARS-CoV-2 nucleic acids MAY BE PRESENT.   A presumptive positive result was obtained on the submitted specimen  and confirmed on repeat testing.  While  2019 novel coronavirus  (SARS-CoV-2) nucleic acids may be present in the submitted sample  additional confirmatory testing may be necessary for epidemiological  and / or clinical management purposes  to differentiate between  SARS-CoV-2 and other Sarbecovirus currently known to infect humans.  If clinically indicated additional testing with an alternate test  methodology [phone]) is advised. The SARS-CoV-2 RNA is generally  detectable in upper and lower respiratory sp ecimens during the acute  phase of infection. The expected result is Negative. Fact Sheet for Patients:  StrictlyIdeas.no Fact Sheet for Healthcare Providers: BankingDealers.co.za This test is not yet approved or cleared by the [place] FDA and has  been authorized for detection and/or diagnosis of SARS-CoV-2 by FDA under an Emergency Use Authorization (EUA).  This EUA will remain in effect (meaning this test can be used) for the duration of the COVID-19 declaration under Section 564(b)(1) of the Act, 21 U.S.C. section 360bbb-3(b)(1), unless the authorization is terminated or revoked sooner. Performed at [facility], [place] [address] [place], [place] [address]       Radiology Studies: Dg Chest Portable 1 View  Result Date: 01/29/2019 CLINICAL DATA:  Concern for aspiration EXAM: PORTABLE CHEST 1 VIEW COMPARISON:  None. FINDINGS: The heart is enlarged. Vascular calcifications are seen in the aortic arch. Mild patchy airspace opacities are seen in the bilateral mid and lower lungs. There is no pleural effusion or pneumothorax. The visualized skeletal structures are unremarkable. IMPRESSION: Mild patchy airspace opacities in the bilateral mid and lower lungs may reflect atelectasis and/or pneumonia. Electronically Signed   By: Zerita Boers M.D.   On: 01/29/2019 16:29      LOS: 1 day   Time spent: More than 50% of that time was spent in counseling and/or  coordination of care.  Antonieta Pert, MD [facility]  01/30/2019, 8:33 AM

## 2019-01-30 NOTE — ED Notes (Signed)
Somnolent but arousable to verbal stimuli and can carry on an appropriate conversation. Wife at the bedside.

## 2019-01-31 ENCOUNTER — Inpatient Hospital Stay (HOSPITAL_COMMUNITY): Payer: Medicare Other

## 2019-01-31 ENCOUNTER — Encounter (HOSPITAL_COMMUNITY): Payer: Self-pay

## 2019-01-31 ENCOUNTER — Other Ambulatory Visit: Payer: Self-pay | Admitting: Cardiology

## 2019-01-31 LAB — BASIC METABOLIC PANEL
Anion gap: 14 (ref 5–15)
BUN: 21 mg/dL (ref 8–23)
CO2: 20 mmol/L — ABNORMAL LOW (ref 22–32)
Calcium: 6.2 mg/dL — CL (ref 8.9–10.3)
Chloride: 111 mmol/L (ref 98–111)
Creatinine, Ser: 2.41 mg/dL — ABNORMAL HIGH (ref 0.61–1.24)
GFR calc Af Amer: 32 mL/min — ABNORMAL LOW (ref >60)
GFR calc non Af Amer: 28 mL/min — ABNORMAL LOW (ref >60)
Glucose, Bld: 117 mg/dL — ABNORMAL HIGH (ref 70–99)
Potassium: 3.6 mmol/L (ref 3.5–5.1)
Sodium: 145 mmol/L (ref 135–145)

## 2019-01-31 LAB — URINALYSIS, ROUTINE W REFLEX MICROSCOPIC
Bacteria, UA: NONE SEEN
Bilirubin Urine: NEGATIVE
Glucose, UA: 50 mg/dL — AB
Ketones, ur: NEGATIVE mg/dL
Leukocytes,Ua: NEGATIVE
Nitrite: NEGATIVE
Protein, ur: >=300 mg/dL — AB
RBC / HPF: >50 RBC/hpf — ABNORMAL HIGH (ref 0–5)
Specific Gravity, Urine: 1.016 (ref 1.005–1.030)
pH: 5 (ref 5.0–8.0)

## 2019-01-31 LAB — CBC
HCT: 36.3 % — ABNORMAL LOW (ref 39.0–52.0)
Hemoglobin: 11.1 g/dL — ABNORMAL LOW (ref 13.0–17.0)
MCH: 27 pg (ref 26.0–34.0)
MCHC: 30.6 g/dL (ref 30.0–36.0)
MCV: 88.3 fL (ref 80.0–100.0)
Platelets: 146 10*3/uL — ABNORMAL LOW (ref 150–400)
RBC: 4.11 MIL/uL — ABNORMAL LOW (ref 4.22–5.81)
RDW: 14.6 % (ref 11.5–15.5)
WBC: 7.1 10*3/uL (ref 4.0–10.5)
nRBC: 0 % (ref 0.0–0.2)

## 2019-01-31 LAB — SODIUM, URINE, RANDOM: Sodium, Ur: 34 mmol/L

## 2019-01-31 LAB — GLUCOSE, CAPILLARY
Glucose-Capillary: 144 mg/dL — ABNORMAL HIGH (ref 70–99)
Glucose-Capillary: 144 mg/dL — ABNORMAL HIGH (ref 70–99)
Glucose-Capillary: 199 mg/dL — ABNORMAL HIGH (ref 70–99)
Glucose-Capillary: 184 mg/dL — ABNORMAL HIGH (ref 70–99)

## 2019-01-31 LAB — CREATININE, URINE, RANDOM: Creatinine, Urine: 116.11 mg/dL

## 2019-01-31 MED ORDER — SODIUM CHLORIDE 0.9 % IV SOLN
INTRAVENOUS | Status: DC
  Administered 2019-01-31 – 2019-02-01 (×3): via INTRAVENOUS

## 2019-01-31 MED ORDER — HYDRALAZINE HCL 25 MG PO TABS
25.0000 mg | ORAL_TABLET | Freq: Three times a day (TID) | ORAL | Status: DC
  Administered 2019-01-31 – 2019-02-01 (×4): 25 mg via ORAL
  Filled 2019-01-31 (×4): qty 1

## 2019-01-31 NOTE — Progress Notes (Signed)
Patient noted to have food from outside. Patient advised and informed about carb diet. Also informed of the policy for food being brought in by family.

## 2019-01-31 NOTE — Progress Notes (Signed)
Patient complained of Dizziness and being light headed. Repositioned. Placed in bed. Re-checked VS. Made MD aware.

## 2019-01-31 NOTE — Progress Notes (Addendum)
Inpatient Diabetes Program Recommendations  AACE/ADA: New Consensus Statement on Inpatient Glycemic Control   Target Ranges:  Prepandial:   less than 140 mg/dL      Peak postprandial:   less than 180 mg/dL (1-2 hours)      Critically ill patients:  140 - 180 mg/dL   Results for [name], [name] (MRN [id number]) as of 01/31/2019 10:20  Ref. Range 01/30/2019 07:38 01/30/2019 09:11 01/30/2019 11:52 01/30/2019 16:09 01/30/2019 16:12 01/30/2019 16:44 01/30/2019 16:46 01/30/2019 17:08 01/30/2019 21:11 01/31/2019 08:03  Glucose-Capillary Latest Ref Range: 70 - 99 mg/dL 110 (H)   70/30 45 units 82 40 (LL) 49 (L) 48 (L) 55 (L) 106 (H) 124 (H) 144 (H)  Novolog 1 unit  Results for [name], [name] (MRN [id number]) as of 01/31/2019 10:20  Ref. Range 07/27/2018 14:08 01/29/2019 21:01  Hemoglobin A1C Latest Ref Range: 4.8 - 5.6 % 6.4 (A) 6.4 (H)   Review of Glycemic Control  Diabetes history: DM2 Outpatient Diabetes medications: 70/30 45 units QAM with breakfast, 70/30 25 units QPM with supper Current orders for Inpatient glycemic control: Novolog 0-9 units TID with meals  Inpatient Diabetes Program Recommendations:   Insulin-Correction: Please consider ordering Novolog 0-5 units QHS for bedtime correction.  Diet: Please consider changing diet from Regular to Carb Modified.  Insulin-Basal: Noted patient was ordered 70/30 and received 70/30 45 units at 9:11 am on 01/30/19 and patient experienced hypoglycemia and 70/30 was discontinued. Once glucose is consistently greater than 180 mg/dl, please consider ordering Levemir 10 units Q24H.  Thanks, Barnie Alderman, RN, MSN, CDE Diabetes Coordinator Inpatient Diabetes Program [phone] (Team Pager from 8am to 5pm)

## 2019-01-31 NOTE — Plan of Care (Signed)
  Problem: Clinical Measurements: Goal: Diagnostic test results will improve Outcome: Not Progressing  Increased cr. Monitoring labs.

## 2019-01-31 NOTE — Telephone Encounter (Signed)
78m 97.1kg Scr 1.9 lovw/christopher

## 2019-01-31 NOTE — Progress Notes (Signed)
PROGRESS NOTE    [name]  [id number] DOB: [birth year] DOA: 01/29/2019 PCP: Lanae Boast, FNP    Brief Narrative:  63 year old gentleman with prior history of stage III CKD status post kidney transplant more than 5 years ago currently on tacrolimus, multiple admissions for accidental methadone overdose, chronic diastolic heart failure, chronic atrial fibrillation on Eliquis, essential hypertension, poorly controlled insulin-dependent diabetes mellitus, reports that he is tapering his methadone dose and was found to be lethargic and was admitted to the hospital for altered mental status. He was admitted for evaluation of altered mental status and acute respiratory failure with hypoxia secondary to aspiration pneumonia. Assessment & Plan:   Active Problems:   DM (diabetes mellitus), type 2, uncontrolled ([place])   Accelerated hypertension   Kidney transplant recipient   Atrial fibrillation, chronic   Chronic diastolic heart failure (HCC)   CKD (chronic kidney disease) stage 3, GFR 30-59 ml/min (HCC)   Acute encephalopathy   Unintentional methadone overdose (HCC)   Acute respiratory failure with hypoxia (HCC)   AKI (acute kidney injury) ([place])   Aspiration pneumonia ([place])   Opiate overdose ([place])   Acute encephalopathy/unintentional methadone overdose Improving but it appears that patient is not back to baseline.  He still very lethargic and sleepy but answering questions appropriately.   Acute respiratory failure with hypoxia secondary to aspiration pneumonia. Patient currently is requiring up to 4 L of nasal cannula oxygen to keep sats greater than 90%.  Continue with broad-spectrum IV antibiotics and pulmonary toilet and wean oxygen as appropriate.  Recommend getting the patient out of bed and ambulation with assistance. Currently is afebrile.    Type 2 diabetes mellitus insulin-dependent and uncontrolled with hyperglycemia and hypoglycemia.  Patient at home was on 70/30  insulin but was discontinued due to hypoglycemia. CBG (last 3)  Recent Labs    01/30/19 2111 01/31/19 0803 01/31/19 1212  GLUCAP 124* 144* 144*   CBGs have improved and we will restart his 70/30 insulin at 10 units twice daily.    Hypertensive urgency Patient's blood pressure parameters slightly high. Patient is on Coreg and nifedipine continue the same and add hydralazine 25 mg 3 times daily.   Hyperlipidemia Continue with Pravachol 40 mg   AKI on stage III CKD, status post renal transplant On tacrolimus Patient's baseline creatinine of 1.8 down to 2.4 today. Get ultrasound kidney, urine studies and urine analysis and check tacrolimus level.  If patient's creatinine continues to increase will get nephrology consultation in the morning.  Chronic atrial fibrillation Rate controlled on Coreg, Eliquis for anticoagulation.  DVT prophylaxis: Eliquis  code Status: Full code Family Communication: None at bedside disposition Plan: (Pending further evaluation and management  Consultants:   None  Procedures: (None Antimicrobials: Unasyn Subjective: Patient denies any chest pain , reports some lightheadedness moving in the bed. He is on 4 L of nasal cannula oxygen.  No fever chills or chills  Objective: Vitals:   01/30/19 1826 01/30/19 2326 01/31/19 0409 01/31/19 0808  BP: (!) 149/83 (!) 144/80  (!) 162/83  Pulse: (!) 107 92  (!) 106  Resp: 19   19  Temp: 99.5 F (37.5 C) 99.1 F (37.3 C)  99.1 F (37.3 C)  TempSrc: Oral Oral    SpO2: 96% 94%  92%  Weight:   103 kg   Height:        Intake/Output Summary (Last 24 hours) at 01/31/2019 0916 Last data filed at 01/31/2019 0300 Gross per 24 hour  Intake  1526.86 ml  Output 700 ml  Net 826.86 ml   Filed Weights   01/30/19 0311 01/31/19 0409  Weight: 99.8 kg 103 kg    Examination:  General exam: Slightly lethargic but answering questions appropriately Respiratory system: Coarse breath sounds at bases bilaterally  Cardiovascular system: S1 & S2 heard, irregular. No JVD,  Gastrointestinal system: Abdomen is nondistended, soft and nontender. No organomegaly or masses felt. Normal bowel sounds heard. Central nervous system: Oriented to place person and time and grossly nonfocal Extremities: Symmetric 5 x 5 power. Skin: No rashes, lesions or ulcers Psychiatry:Mood & affect appropriate.     Data Reviewed: I have personally reviewed following labs and imaging studies  CBC: Recent Labs  Lab 01/29/19 1624 01/30/19 0557 01/31/19 0638  WBC 11.4* 13.8* 7.1  NEUTROABS 10.0*  --   --   HGB 15.2 14.1 11.1*  HCT 48.9 46.8 36.3*  MCV 88.3 89.1 88.3  PLT 232 204 [id number]*   Basic Metabolic Panel: Recent Labs  Lab 01/29/19 1624 01/30/19 0557 01/31/19 0638  NA 142 138 145  K 5.0 4.9 3.6  CL 108 106 111  CO2 26 25 20*  GLUCOSE 82 105* 117*  BUN 22 23 21   CREATININE 1.90* 1.95* 2.41*  CALCIUM 8.9 8.5* 6.2*   GFR: Estimated Creatinine Clearance: 37.7 mL/min (A) (by C-G formula based on SCr of 2.41 mg/dL (H)). Liver Function Tests: Recent Labs  Lab 01/29/19 1624  AST 17  ALT 13  ALKPHOS 66  BILITOT 0.7  PROT 6.9  ALBUMIN 3.5   Recent Labs  Lab 01/29/19 1624  LIPASE 35   No results for input(s): AMMONIA in the last 168 hours. Coagulation Profile: No results for input(s): INR, PROTIME in the last 168 hours. Cardiac Enzymes: No results for input(s): CKTOTAL, CKMB, CKMBINDEX, TROPONINI in the last 168 hours. BNP (last 3 results) No results for input(s): PROBNP in the last 8760 hours. HbA1C: Recent Labs    01/29/19 2101  HGBA1C 6.4*   CBG: Recent Labs  Lab 01/30/19 1644 01/30/19 1646 01/30/19 1708 01/30/19 2111 01/31/19 0803  GLUCAP 48* 55* 106* 124* 144*   Lipid Profile: No results for input(s): CHOL, HDL, LDLCALC, TRIG, CHOLHDL, LDLDIRECT in the last 72 hours. Thyroid Function Tests: No results for input(s): TSH, T4TOTAL, FREET4, T3FREE, THYROIDAB in the last 72 hours.  Anemia Panel: No results for input(s): VITAMINB12, FOLATE, FERRITIN, TIBC, IRON, RETICCTPCT in the last 72 hours. Sepsis Labs: No results for input(s): PROCALCITON, LATICACIDVEN in the last 168 hours.  Recent Results (from the past 240 hour(s))  SARS Coronavirus 2 [facility] order, Performed in [facility] hospital lab) Nasopharyngeal Nasopharyngeal Swab     Status: None   Collection Time: 01/29/19  3:17 PM   Specimen: Nasopharyngeal Swab  Result Value Ref Range Status   SARS Coronavirus 2 NEGATIVE NEGATIVE Final    Comment: (NOTE) If result is NEGATIVE SARS-CoV-2 target nucleic acids are NOT DETECTED. The SARS-CoV-2 RNA is generally detectable in upper and lower  respiratory specimens during the acute phase of infection. The lowest  concentration of SARS-CoV-2 viral copies this assay can detect is 250  copies / mL. A negative result does not preclude SARS-CoV-2 infection  and should not be used as the sole basis for treatment or other  patient management decisions.  A negative result may occur with  improper specimen collection / handling, submission of specimen other  than nasopharyngeal swab, presence of viral mutation(s) within the  areas targeted by this assay,  and inadequate number of viral copies  (<250 copies / mL). A negative result must be combined with clinical  observations, patient history, and epidemiological information. If result is POSITIVE SARS-CoV-2 target nucleic acids are DETECTED. The SARS-CoV-2 RNA is generally detectable in upper and lower  respiratory specimens dur ing the acute phase of infection.  Positive  results are indicative of active infection with SARS-CoV-2.  Clinical  correlation with patient history and other diagnostic information is  necessary to determine patient infection status.  Positive results do  not rule out bacterial infection or co-infection with other viruses. If result is PRESUMPTIVE POSTIVE SARS-CoV-2 nucleic acids MAY BE PRESENT.    A presumptive positive result was obtained on the submitted specimen  and confirmed on repeat testing.  While 2019 novel coronavirus  (SARS-CoV-2) nucleic acids may be present in the submitted sample  additional confirmatory testing may be necessary for epidemiological  and / or clinical management purposes  to differentiate between  SARS-CoV-2 and other Sarbecovirus currently known to infect humans.  If clinically indicated additional testing with an alternate test  methodology [phone]) is advised. The SARS-CoV-2 RNA is generally  detectable in upper and lower respiratory sp ecimens during the acute  phase of infection. The expected result is Negative. Fact Sheet for Patients:  StrictlyIdeas.no Fact Sheet for Healthcare Providers: BankingDealers.co.za This test is not yet approved or cleared by the [place] FDA and has been authorized for detection and/or diagnosis of SARS-CoV-2 by FDA under an Emergency Use Authorization (EUA).  This EUA will remain in effect (meaning this test can be used) for the duration of the COVID-19 declaration under Section 564(b)(1) of the Act, 21 U.S.C. section 360bbb-3(b)(1), unless the authorization is terminated or revoked sooner. Performed at [facility], [place] [address], [place], [place] [address]          Radiology Studies: Dg Chest Portable 1 View  Result Date: 01/29/2019 CLINICAL DATA:  Concern for aspiration EXAM: PORTABLE CHEST 1 VIEW COMPARISON:  None. FINDINGS: The heart is enlarged. Vascular calcifications are seen in the aortic arch. Mild patchy airspace opacities are seen in the bilateral mid and lower lungs. There is no pleural effusion or pneumothorax. The visualized skeletal structures are unremarkable. IMPRESSION: Mild patchy airspace opacities in the bilateral mid and lower lungs may reflect atelectasis and/or pneumonia. Electronically Signed   By: Zerita Boers M.D.   On:  01/29/2019 16:29        Scheduled Meds: . apixaban  5 mg Oral BID  . aspirin EC  81 mg Oral Daily  . carvedilol  25 mg Oral BID WC  . hydrALAZINE  25 mg Oral Q8H  . insulin aspart  0-9 Units Subcutaneous TID WC  . magnesium oxide  400 mg Oral Daily  . methadone  60 mg Oral Daily  . mycophenolate  540 mg Oral BID  . NIFEdipine  60 mg Oral Daily  . pantoprazole  40 mg Oral Daily  . pravastatin  40 mg Oral 3 times weekly  . pregabalin  100 mg Oral TID  . tacrolimus  2 mg Oral BID   Continuous Infusions: . sodium chloride    . ampicillin-sulbactam (UNASYN) IV 1.5 g (01/31/19 0626)     LOS: 2 days        Hosie Poisson, MD [facility] Pager [phone]  If 7PM-7AM, please contact night-coverage [email or web address] Password [facility] 01/31/2019, 9:16 AM

## 2019-01-31 NOTE — Care Management Important Message (Signed)
Important Message  Patient Details  Name: [name] MRN: [id number] Date of Birth: 12/14/1955   Medicare Important Message Given:  Yes     Shelda Altes 01/31/2019, 2:07 PM

## 2019-01-31 NOTE — Progress Notes (Signed)
CRITICAL VALUE ALERT  Critical Value:  Calcium 6.2  Date & Time Notied:  0830 01-31-19  Provider Notified: Karleen Hampshire  Orders Received/Actions taken: None

## 2019-02-01 ENCOUNTER — Inpatient Hospital Stay (HOSPITAL_COMMUNITY): Payer: Medicare Other

## 2019-02-01 LAB — BASIC METABOLIC PANEL
Anion gap: 10 (ref 5–15)
Anion gap: 8 (ref 5–15)
BUN: 29 mg/dL — ABNORMAL HIGH (ref 8–23)
BUN: 29 mg/dL — ABNORMAL HIGH (ref 8–23)
CO2: 23 mmol/L (ref 22–32)
CO2: 23 mmol/L (ref 22–32)
Calcium: 8.5 mg/dL — ABNORMAL LOW (ref 8.9–10.3)
Calcium: 8.8 mg/dL — ABNORMAL LOW (ref 8.9–10.3)
Chloride: 103 mmol/L (ref 98–111)
Chloride: 104 mmol/L (ref 98–111)
Creatinine, Ser: 1.98 mg/dL — ABNORMAL HIGH (ref 0.61–1.24)
Creatinine, Ser: 1.81 mg/dL — ABNORMAL HIGH (ref 0.61–1.24)
GFR calc Af Amer: 40 mL/min — ABNORMAL LOW (ref >60)
GFR calc Af Amer: 45 mL/min — ABNORMAL LOW (ref >60)
GFR calc non Af Amer: 35 mL/min — ABNORMAL LOW (ref >60)
GFR calc non Af Amer: 39 mL/min — ABNORMAL LOW (ref >60)
Glucose, Bld: 156 mg/dL — ABNORMAL HIGH (ref 70–99)
Glucose, Bld: 154 mg/dL — ABNORMAL HIGH (ref 70–99)
Potassium: 4.6 mmol/L (ref 3.5–5.1)
Potassium: 5.6 mmol/L — ABNORMAL HIGH (ref 3.5–5.1)
Sodium: 136 mmol/L (ref 135–145)
Sodium: 135 mmol/L (ref 135–145)

## 2019-02-01 LAB — CBC
HCT: 41.9 % (ref 39.0–52.0)
Hemoglobin: 12.8 g/dL — ABNORMAL LOW (ref 13.0–17.0)
MCH: 26.8 pg (ref 26.0–34.0)
MCHC: 30.5 g/dL (ref 30.0–36.0)
MCV: 87.8 fL (ref 80.0–100.0)
Platelets: 177 10*3/uL (ref 150–400)
RBC: 4.77 MIL/uL (ref 4.22–5.81)
RDW: 14.6 % (ref 11.5–15.5)
WBC: 7.5 10*3/uL (ref 4.0–10.5)
nRBC: 0 % (ref 0.0–0.2)

## 2019-02-01 LAB — BLOOD GAS, ARTERIAL
Acid-base deficit: 2.3 mmol/L — ABNORMAL HIGH (ref 0.0–2.0)
Bicarbonate: 22.7 mmol/L (ref 20.0–28.0)
FIO2: 40
O2 Saturation: 86.5 %
Patient temperature: 98.6
pCO2 arterial: 44.1 mmHg (ref 32.0–48.0)
pH, Arterial: 7.332 — ABNORMAL LOW (ref 7.350–7.450)
pO2, Arterial: 54.2 mmHg — ABNORMAL LOW (ref 83.0–108.0)

## 2019-02-01 LAB — GLUCOSE, CAPILLARY
Glucose-Capillary: 40 mg/dL — CL (ref 70–99)
Glucose-Capillary: 155 mg/dL — ABNORMAL HIGH (ref 70–99)
Glucose-Capillary: 152 mg/dL — ABNORMAL HIGH (ref 70–99)
Glucose-Capillary: 139 mg/dL — ABNORMAL HIGH (ref 70–99)
Glucose-Capillary: 173 mg/dL — ABNORMAL HIGH (ref 70–99)

## 2019-02-01 MED ORDER — IPRATROPIUM-ALBUTEROL 0.5-2.5 (3) MG/3ML IN SOLN
3.0000 mL | Freq: Four times a day (QID) | RESPIRATORY_TRACT | Status: DC
  Administered 2019-02-02 (×2): 3 mL via RESPIRATORY_TRACT
  Filled 2019-02-01 (×3): qty 3

## 2019-02-01 MED ORDER — DEXTROSE 50 % IV SOLN
INTRAVENOUS | Status: AC
  Filled 2019-02-01: qty 50

## 2019-02-01 MED ORDER — NALOXONE HCL 0.4 MG/ML IJ SOLN
INTRAMUSCULAR | Status: AC
  Filled 2019-02-01: qty 1

## 2019-02-01 MED ORDER — CLONIDINE HCL 0.1 MG PO TABS
0.3000 mg | ORAL_TABLET | Freq: Three times a day (TID) | ORAL | Status: DC
  Administered 2019-02-01 – 2019-02-05 (×12): 0.3 mg via ORAL
  Filled 2019-02-01 (×12): qty 3

## 2019-02-01 MED ORDER — METHADONE HCL 10 MG PO TABS: 40.0000 mg | ORAL_TABLET | Freq: Every day | ORAL | Status: DC

## 2019-02-01 MED ORDER — FUROSEMIDE 10 MG/ML IJ SOLN
40.0000 mg | Freq: Once | INTRAMUSCULAR | Status: AC
  Administered 2019-02-01: 40 mg via INTRAVENOUS
  Filled 2019-02-01: qty 4

## 2019-02-01 MED ORDER — INSULIN ASPART PROT & ASPART (70-30 MIX) 100 UNIT/ML ~~LOC~~ SUSP
8.0000 [IU] | Freq: Two times a day (BID) | SUBCUTANEOUS | Status: DC
  Administered 2019-02-02 – 2019-02-04 (×5): 8 [IU] via SUBCUTANEOUS
  Filled 2019-02-01: qty 10

## 2019-02-01 MED ORDER — FUROSEMIDE 20 MG PO TABS
20.0000 mg | ORAL_TABLET | Freq: Once | ORAL | Status: AC
  Administered 2019-02-01: 20 mg via ORAL
  Filled 2019-02-01: qty 1

## 2019-02-01 MED ORDER — NALOXONE HCL 0.4 MG/ML IJ SOLN
0.2000 mg | INTRAMUSCULAR | Status: DC | PRN
  Administered 2019-02-01: 0.2 mg via INTRAVENOUS

## 2019-02-01 MED ORDER — METHADONE HCL 10 MG PO TABS
20.0000 mg | ORAL_TABLET | Freq: Every day | ORAL | Status: DC
  Filled 2019-02-01: qty 2

## 2019-02-01 NOTE — Progress Notes (Addendum)
PROGRESS NOTE    [name]  [id number] DOB: Jul 10, 1955 DOA: 01/29/2019 PCP: Lanae Boast, FNP    Brief Narrative:  63 year old gentleman with prior history of stage III CKD status post kidney transplant more than 5 years ago currently on tacrolimus, multiple admissions for accidental methadone overdose, chronic diastolic heart failure, chronic atrial fibrillation on Eliquis, essential hypertension, poorly controlled insulin-dependent diabetes mellitus, reports that he is tapering his methadone dose and was found to be lethargic and was admitted to the hospital for altered mental status. He was admitted for evaluation of altered mental status and acute respiratory failure with hypoxia secondary to aspiration pneumonia. Assessment & Plan:   Active Problems:   DM (diabetes mellitus), type 2, uncontrolled ([place])   Accelerated hypertension   Kidney transplant recipient   Atrial fibrillation, chronic   Chronic diastolic heart failure (HCC)   CKD (chronic kidney disease) stage 3, GFR 30-59 ml/min (HCC)   Acute encephalopathy   Unintentional methadone overdose (HCC)   Acute respiratory failure with hypoxia (HCC)   AKI (acute kidney injury) ([place])   Aspiration pneumonia ([place])   Opiate overdose ([place])   Acute encephalopathy/unintentional methadone overdose Improving, he got the methadone 3m g daily and after the dose , patient is very lethargic and drowsy . We will decrease the dose of the methadone to 40 mg daily.  Monitor for hypoxia.    Acute respiratory failure with hypoxia secondary to aspiration pneumonia. Patient initially required about 4 L of nasal cannula oxygen to keep sats greater than 90% , currently on 2 L of nasal cannula oxygen.   Continue with broad-spectrum IV antibiotics and pulmonary toilet  Wean oxygen as appropriate T-max of 99.1 and WBC count is within normal limits. Chest x-ray repeated this morning showed worsening of the bilateral infiltrates and worsening  of the haziness on the right upper lobe.  Probably secondary to fluid overload we will stop the fluids and monitor.    Type 2 diabetes mellitus insulin-dependent   Patient  was on 70/30 insulin at home but was discontinued due to hypoglycemia. CBG (last 3)  Recent Labs    01/31/19 2117 02/01/19 0757 02/01/19 1142  GLUCAP 184* 155* 152*   CBGs have improved and we will restart his 70/30 insulin at 8 units twice daily.    Hypertensive urgency Patient's blood pressure parameters slightly high. Patient is on Coreg and nifedipine continue the same , restarted his home medication clonidine 0.3 mg TID.  Prn hydralazine on board.    Hyperlipidemia Continue with Pravachol 40 mg   AKI on stage III CKD, status post renal transplant On tacrolimus Patient's baseline creatinine of 1.8 , was elevated to 2.4 , improved to 1.98 today. Renal US is NEGATIVE for hydronephrosis.  Urine studies reviewed.  Tacrolimus level is pending.    Chronic atrial fibrillation Rate controlled on Coreg, Eliquis for anticoagulation.    DVT prophylaxis: Eliquis  code Status: Full code Family Communication: None at bedside  disposition Plan: pending improvement in the pneumonia and hypoxia and PT evaluation.   Consultants:   None  Procedures: (None Antimicrobials: Unasyn Subjective: Pt was seen before methadone dose and after methadone dose, pt became very lethargic and sleepy after the methadone.  Decreased the dose.  He still reports sob, has tachypnea. No nausea or vomiting or abdominal pain.   Objective: Vitals:   01/31/19 1626 01/31/19 2140 02/01/19 0630 02/01/19 0757  BP: (!) 144/78 138/76 (!) 152/87 (!) 158/95  Pulse: 87 96 91 (!)  101  Resp: 19 18    Temp: 99 F (37.2 C) 98.7 F (37.1 C)  98.1 F (36.7 C)  TempSrc: Oral Oral  Oral  SpO2: 92% 90%  95%  Weight:      Height:        Intake/Output Summary (Last 24 hours) at 02/01/2019 1339 Last data filed at 02/01/2019 1100 Gross  per 24 hour  Intake 2605.85 ml  Output 600 ml  Net 2005.85 ml   Filed Weights   01/30/19 0311 01/31/19 0409  Weight: 99.8 kg 103 kg    Examination:  General exam: Lethargic but not in any kind of distress Respiratory system: Basilar Rales, tachypneic coarse breath sounds Cardiovascular system: S1-S2 heard, irregular, no JVD Gastrointestinal system: Diminished soft, nontender, nondistended, bowel sounds are good Central nervous system: She is oriented to place person and time, Extremities: No pedal edema, cyanosis or clubbing Skin: No rashes Psychiatry: Mood appropriate    Data Reviewed: I have personally reviewed following labs and imaging studies  CBC: Recent Labs  Lab 01/29/19 1624 01/30/19 0557 01/31/19 0638 02/01/19 0439  WBC 11.4* 13.8* 7.1 7.5  NEUTROABS 10.0*  --   --   --   HGB 15.2 14.1 11.1* 12.8*  HCT 48.9 46.8 36.3* 41.9  MCV 88.3 89.1 88.3 87.8  PLT 232 204 146* [id number]   Basic Metabolic Panel: Recent Labs  Lab 01/29/19 1624 01/30/19 0557 01/31/19 0638 02/01/19 0439  NA 142 138 145 136  K 5.0 4.9 3.6 4.6  CL 108 106 111 103  CO2 26 25 20* 23  GLUCOSE 82 105* 117* 156*  BUN 22 23 21  29*  CREATININE 1.90* 1.95* 2.41* 1.98*  CALCIUM 8.9 8.5* 6.2* 8.5*   GFR: Estimated Creatinine Clearance: 45.9 mL/min (A) (by C-G formula based on SCr of 1.98 mg/dL (H)). Liver Function Tests: Recent Labs  Lab 01/29/19 1624  AST 17  ALT 13  ALKPHOS 66  BILITOT 0.7  PROT 6.9  ALBUMIN 3.5   Recent Labs  Lab 01/29/19 1624  LIPASE 35   No results for input(s): AMMONIA in the last 168 hours. Coagulation Profile: No results for input(s): INR, PROTIME in the last 168 hours. Cardiac Enzymes: No results for input(s): CKTOTAL, CKMB, CKMBINDEX, TROPONINI in the last 168 hours. BNP (last 3 results) No results for input(s): PROBNP in the last 8760 hours. HbA1C: Recent Labs    01/29/19 2101  HGBA1C 6.4*   CBG: Recent Labs  Lab 01/31/19 1212 01/31/19 1605  01/31/19 2117 02/01/19 0757 02/01/19 1142  GLUCAP 144* 199* 184* 155* 152*   Lipid Profile: No results for input(s): CHOL, HDL, LDLCALC, TRIG, CHOLHDL, LDLDIRECT in the last 72 hours. Thyroid Function Tests: No results for input(s): TSH, T4TOTAL, FREET4, T3FREE, THYROIDAB in the last 72 hours. Anemia Panel: No results for input(s): VITAMINB12, FOLATE, FERRITIN, TIBC, IRON, RETICCTPCT in the last 72 hours. Sepsis Labs: No results for input(s): PROCALCITON, LATICACIDVEN in the last 168 hours.  Recent Results (from the past 240 hour(s))  SARS Coronavirus 2 [facility] order, Performed in [facility] hospital lab) Nasopharyngeal Nasopharyngeal Swab     Status: None   Collection Time: 01/29/19  3:17 PM   Specimen: Nasopharyngeal Swab  Result Value Ref Range Status   SARS Coronavirus 2 NEGATIVE NEGATIVE Final    Comment: (NOTE) If result is NEGATIVE SARS-CoV-2 target nucleic acids are NOT DETECTED. The SARS-CoV-2 RNA is generally detectable in upper and lower  respiratory specimens during the acute phase of infection. The  lowest  concentration of SARS-CoV-2 viral copies this assay can detect is 250  copies / mL. A negative result does not preclude SARS-CoV-2 infection  and should not be used as the sole basis for treatment or other  patient management decisions.  A negative result may occur with  improper specimen collection / handling, submission of specimen other  than nasopharyngeal swab, presence of viral mutation(s) within the  areas targeted by this assay, and inadequate number of viral copies  (<250 copies / mL). A negative result must be combined with clinical  observations, patient history, and epidemiological information. If result is POSITIVE SARS-CoV-2 target nucleic acids are DETECTED. The SARS-CoV-2 RNA is generally detectable in upper and lower  respiratory specimens dur ing the acute phase of infection.  Positive  results are indicative of active infection with  SARS-CoV-2.  Clinical  correlation with patient history and other diagnostic information is  necessary to determine patient infection status.  Positive results do  not rule out bacterial infection or co-infection with other viruses. If result is PRESUMPTIVE POSTIVE SARS-CoV-2 nucleic acids MAY BE PRESENT.   A presumptive positive result was obtained on the submitted specimen  and confirmed on repeat testing.  While 2019 novel coronavirus  (SARS-CoV-2) nucleic acids may be present in the submitted sample  additional confirmatory testing may be necessary for epidemiological  and / or clinical management purposes  to differentiate between  SARS-CoV-2 and other Sarbecovirus currently known to infect humans.  If clinically indicated additional testing with an alternate test  methodology [phone]) is advised. The SARS-CoV-2 RNA is generally  detectable in upper and lower respiratory sp ecimens during the acute  phase of infection. The expected result is Negative. Fact Sheet for Patients:  StrictlyIdeas.no Fact Sheet for Healthcare Providers: BankingDealers.co.za This test is not yet approved or cleared by the [place] FDA and has been authorized for detection and/or diagnosis of SARS-CoV-2 by FDA under an Emergency Use Authorization (EUA).  This EUA will remain in effect (meaning this test can be used) for the duration of the COVID-19 declaration under Section 564(b)(1) of the Act, 21 U.S.C. section 360bbb-3(b)(1), unless the authorization is terminated or revoked sooner. Performed at [facility], [place] [address] [place], [place] [address]          Radiology Studies: Dg Chest 2 View  Result Date: 02/01/2019 CLINICAL DATA:  Shortness of breath. Difficulty breathing. EXAM: CHEST - 2 VIEW COMPARISON:  01/29/2019 FINDINGS: There has been a significant progression of the infiltrate in the right upper lung zone with slight  increased hazy infiltrates at both lung bases. Persistent cardiomegaly. Pulmonary vascularity is normal. Aortic atherosclerosis. On the lateral view there is suggestion of either effusions or posterior basal consolidation. IMPRESSION: 1. Progressive infiltrate in the right upper lung zone. 2. Increased hazy infiltrates at both lung bases. 3. Bilateral pleural effusions or posterior basal consolidation. 4. Cardiomegaly. 5. The findings could represent progressive pneumonia or progressive pulmonary edema. Electronically Signed   By: Lorriane Shire M.D.   On: 02/01/2019 11:21   US Renal  Result Date: 01/31/2019 CLINICAL DATA:  Acute renal injury.  Renal transplant 2015. EXAM: RENAL / URINARY TRACT ULTRASOUND COMPLETE COMPARISON:  CT 04/06/2014. FINDINGS: Right Kidney: Renal measurements: 8.0 x 4.8 x 4.6 cm = volume: 93.1 mL. Increased echogenicity with multiple tiny cyst largest of which measures 1.9 cm again noted. Similar findings noted on prior CT. Left Kidney: Renal measurements: Not visualized. Right lower quadrant transplant kidney measures  11.8 x 7.2 x 6.7 cm with a volume of 297.2 cc. No hydronephrosis. Bladder: Appears normal for degree of bladder distention. Incidental note made of a small left pleural effusion. IMPRESSION: 1. Small echogenic right kidney consistent chronic renal disease with multiple small cysts again noted. No interim change. Left kidney not visualized. 2. Right lower quadrant transplant kidney appears unremarkable. No hydronephrosis. No bladder distention. 3.  Incidental note made of small left pleural effusion. Electronically Signed   By: [place]   On: 01/31/2019 13:06        Scheduled Meds: . apixaban  5 mg Oral BID  . aspirin EC  81 mg Oral Daily  . carvedilol  25 mg Oral BID WC  . hydrALAZINE  25 mg Oral Q8H  . insulin aspart  0-9 Units Subcutaneous TID WC  . magnesium oxide  400 mg Oral Daily  . [START ON 02/02/2019] methadone  40 mg Oral Daily  .  mycophenolate  540 mg Oral BID  . NIFEdipine  60 mg Oral Daily  . pantoprazole  40 mg Oral Daily  . pravastatin  40 mg Oral 3 times weekly  . pregabalin  100 mg Oral TID  . tacrolimus  2 mg Oral BID   Continuous Infusions: . ampicillin-sulbactam (UNASYN) IV 1.5 g (02/01/19 1322)     LOS: 3 days        Hosie Poisson, MD [facility] Pager [phone]  If 7PM-7AM, please contact night-coverage [email or web address] Password [facility] 02/01/2019, 1:39 PM    Addendum:  Notified by RN at 5 :30 pm that pt is lethargic and hypoxic on 2 lit of [place] oxygen.   On exam,  Pt is lethargic, but able to answer all questions appropriately.  No facial droop , able to move all extremities.  Strength 5/5 in all extremities, no sensory deficits.  Speech slow but clear.  CVS  S1s2, tachycardic, irregular.  Lungs bilateral rales. Scattered wheezing, air entry diminished at bases.  Abdomen is soft , non tender  Non distended. Bowel sounds good.  Extremities no pedal edema.    Plan:  CXR stat, ABG, showing hypoxia. Increased to NRB.  CXR showing improving. Aeration.  Lethargy probably from methadone dose, . His mental status improved after one dose of narcan of 0.2 mg.  One dose of IV lasix 40 mg given.  duonebs ordered.  Transferred the patient to step down. Called wife's number but couldn't reach her.  Discussed with Night NP .     Hosie Poisson, MD [phone]

## 2019-02-01 NOTE — Progress Notes (Signed)
Patient satting 69% on 4L. RN placed patient on a nonrebreather, gave Narcan and Lasix per MD. Saturation increased to 94%. Patient still lethargic but answering questions appropriately and following commands. Received new orders for chest x-ray, ABG, EKG. Patient level of care is progressive now. Will continue to monitor patient closely.

## 2019-02-01 NOTE — Discharge Instructions (Signed)

## 2019-02-01 NOTE — Progress Notes (Signed)
This note also relates to the following rows which could not be included: SpO2 - Cannot attach notes to unvalidated device data  ABG drawn on 5l/min [place]

## 2019-02-01 NOTE — TOC Initial Note (Signed)
Transition of Care [facility]) - Initial/Assessment Note    Patient Details  Name: [name] MRN: [id number] Date of Birth: 1955/10/20  [facility]) CM/SW Contact:    Carles Collet, RN Phone Number: 02/01/2019, 12:17 PM  Clinical Narrative:                 Damaris Schooner w patient at bedside. He states that he is from with his fiance. He states that he does not drive, but she drives him as needed to apts etc. He denies difficulties obtaining meds. He is a current client at [facility] for methadone. Clinical workup still ongoing for kidney clearance.  PCP Rosalin Hawking at [facility]. CM will continue to follow for DC planning and potential home oxygen needs. Patient states that he is ambulatory and independent at home but that his legs feel weak. CM confirmed that PT eval pending.    Expected Discharge Plan: [place] Barriers to Discharge: Continued Medical Work up   Patient Goals and CMS Choice Patient states their goals for this hospitalization and ongoing recovery are:: to return home      Expected Discharge Plan and Services Expected Discharge Plan: [place]   Discharge Planning Services: CM Consult                                          Prior Living Arrangements/Services   Lives with:: Domestic Partner("Fiance")                   Activities of Daily Living Home Assistive Devices/Equipment: None ADL Screening (condition at time of admission) Patient's cognitive ability adequate to safely complete daily activities?: Yes Is the patient deaf or have difficulty hearing?: No Does the patient have difficulty seeing, even when wearing glasses/contacts?: No Does the patient have difficulty concentrating, remembering, or making decisions?: No Patient able to express need for assistance with ADLs?: No Does the patient have difficulty dressing or bathing?: No Independently performs ADLs?: Yes (appropriate  for developmental age) Does the patient have difficulty walking or climbing stairs?: No Weakness of Legs: None Weakness of Arms/Hands: None  Permission Sought/Granted                  Emotional Assessment              Admission diagnosis:  Hypoxia [R09.02] Opiate overdose, undetermined intent, initial encounter ([place]) [T40.604A] Opiate overdose, accidental or unintentional, initial encounter [facility]) [T40.601A] Patient Active Problem List   Diagnosis Date Noted  . AKI (acute kidney injury) ([place]) 01/29/2019  . Aspiration pneumonia ([place]) 01/29/2019  . Opiate overdose ([place]) 01/29/2019  . Acute respiratory failure with hypoxia ([place]) 05/23/2018  . Acute respiratory failure ([place]) 05/23/2018  . Unintentional methadone overdose ([place]) 05/22/2018  . CHF (congestive heart failure) ([place]) 01/28/2018  . Pulmonary hypertension, unspecified ([place]) 01/27/2018  . Hypoglycemia 01/27/2018  . Hepatitis C antibody test positive 01/27/2018  . Acute encephalopathy 01/02/2018  . Lymphedema 10/26/2017  . CHF exacerbation ([place]) 09/23/2017  . CKD (chronic kidney disease) stage 3, GFR 30-59 ml/min (H[facility]) 09/23/2017  . Acute CHF (congestive heart failure) ([place]) 09/23/2017  . Chronic venous insufficiency 09/21/2017  . Venous stasis dermatitis of both lower extremities 09/21/2017  . Methadone use ([place]) 07/30/2017  . Chest pain with moderate risk for cardiac etiology 11/03/2016  . Hyperlipidemia 11/03/2016  .  Chronic diastolic heart failure ([place]) 11/03/2016  . Hypokalemia 11/03/2016  . Kidney transplant recipient 10/16/2013  . Atrial fibrillation, chronic 10/16/2013  . Other complications due to renal dialysis device, implant, and graft 12/22/2012  . Pseudoaneurysm of Left forearm loop AVG 12/22/2012  . Uncontrolled hypertension 05/13/2011  . Accelerated hypertension 04/14/2011  . DM (diabetes mellitus), type 2, uncontrolled ([place]) 04/11/2011  . HTN (hypertension) 04/11/2011   PCP:  Lanae Boast,  [place] [facility]acy:   [facility] [place] [address] Phone: [phone] Fax: [phone]     Social Determinants of Health (SDOH) Interventions    Readmission Risk Interventions No flowsheet data found.

## 2019-02-02 ENCOUNTER — Inpatient Hospital Stay (HOSPITAL_COMMUNITY): Payer: Medicare Other

## 2019-02-02 LAB — CBC
HCT: 39.6 % (ref 39.0–52.0)
Hemoglobin: 11.8 g/dL — ABNORMAL LOW (ref 13.0–17.0)
MCH: 26.6 pg (ref 26.0–34.0)
MCHC: 29.8 g/dL — ABNORMAL LOW (ref 30.0–36.0)
MCV: 89.4 fL (ref 80.0–100.0)
Platelets: 190 10*3/uL (ref 150–400)
RBC: 4.43 MIL/uL (ref 4.22–5.81)
RDW: 14.5 % (ref 11.5–15.5)
WBC: 6.8 10*3/uL (ref 4.0–10.5)
nRBC: 0 % (ref 0.0–0.2)

## 2019-02-02 LAB — BASIC METABOLIC PANEL
Anion gap: 8 (ref 5–15)
BUN: 33 mg/dL — ABNORMAL HIGH (ref 8–23)
CO2: 23 mmol/L (ref 22–32)
Calcium: 8.5 mg/dL — ABNORMAL LOW (ref 8.9–10.3)
Chloride: 103 mmol/L (ref 98–111)
Creatinine, Ser: 2.08 mg/dL — ABNORMAL HIGH (ref 0.61–1.24)
GFR calc Af Amer: 38 mL/min — ABNORMAL LOW (ref >60)
GFR calc non Af Amer: 33 mL/min — ABNORMAL LOW (ref >60)
Glucose, Bld: 152 mg/dL — ABNORMAL HIGH (ref 70–99)
Potassium: 5.1 mmol/L (ref 3.5–5.1)
Sodium: 134 mmol/L — ABNORMAL LOW (ref 135–145)

## 2019-02-02 LAB — GLUCOSE, CAPILLARY
Glucose-Capillary: 201 mg/dL — ABNORMAL HIGH (ref 70–99)
Glucose-Capillary: 162 mg/dL — ABNORMAL HIGH (ref 70–99)
Glucose-Capillary: 213 mg/dL — ABNORMAL HIGH (ref 70–99)

## 2019-02-02 LAB — TACROLIMUS LEVEL: Tacrolimus (FK506) - LabCorp: 10 ng/mL (ref 2.0–20.0)

## 2019-02-02 MED ORDER — METHOCARBAMOL 500 MG PO TABS: 500.0000 mg | ORAL_TABLET | Freq: Three times a day (TID) | ORAL | Status: DC | PRN

## 2019-02-02 MED ORDER — FLEET ENEMA 7-19 GM/118ML RE ENEM: 1.0000 | ENEMA | Freq: Every day | RECTAL | Status: DC | PRN

## 2019-02-02 MED ORDER — POLYETHYLENE GLYCOL 3350 17 G PO PACK
17.0000 g | PACK | Freq: Two times a day (BID) | ORAL | Status: DC
  Administered 2019-02-02 – 2019-02-05 (×6): 17 g via ORAL
  Filled 2019-02-02 (×6): qty 1

## 2019-02-02 MED ORDER — SODIUM CHLORIDE 0.9 % IV SOLN
3.0000 g | Freq: Four times a day (QID) | INTRAVENOUS | Status: DC
  Administered 2019-02-02 – 2019-02-05 (×12): 3 g via INTRAVENOUS
  Filled 2019-02-02 (×3): qty 3
  Filled 2019-02-02: qty 8
  Filled 2019-02-02: qty 3
  Filled 2019-02-02: qty 8
  Filled 2019-02-02 (×2): qty 3
  Filled 2019-02-02: qty 8
  Filled 2019-02-02: qty 3
  Filled 2019-02-02 (×4): qty 8

## 2019-02-02 MED ORDER — BISACODYL 5 MG PO TBEC
10.0000 mg | DELAYED_RELEASE_TABLET | Freq: Once | ORAL | Status: AC
  Administered 2019-02-02: 10 mg via ORAL
  Filled 2019-02-02: qty 2

## 2019-02-02 MED ORDER — SENNOSIDES-DOCUSATE SODIUM 8.6-50 MG PO TABS
2.0000 | ORAL_TABLET | Freq: Two times a day (BID) | ORAL | Status: DC
  Administered 2019-02-02 – 2019-02-05 (×6): 2 via ORAL
  Filled 2019-02-02 (×6): qty 2

## 2019-02-02 MED ORDER — FUROSEMIDE 10 MG/ML IJ SOLN
60.0000 mg | Freq: Once | INTRAMUSCULAR | Status: AC
  Administered 2019-02-02: 60 mg via INTRAVENOUS
  Filled 2019-02-02: qty 6

## 2019-02-02 MED ORDER — IPRATROPIUM-ALBUTEROL 0.5-2.5 (3) MG/3ML IN SOLN: 3.0000 mL | Freq: Four times a day (QID) | RESPIRATORY_TRACT | Status: DC | PRN

## 2019-02-02 MED ORDER — SALINE SPRAY 0.65 % NA SOLN
1.0000 | NASAL | Status: DC | PRN
  Filled 2019-02-02: qty 44
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
## 2019-02-02 NOTE — Evaluation (Signed)
Physical Therapy Evaluation Patient Details Name: [name] MRN: [id number] DOB: Feb 13, 1956 Today's Date: 02/02/2019   History of Present Illness  Pt adm with acute encephalopathy/unintentional methadone overdose and acute resp failure with hypoxia. PMH - DM, ckd, kidney transplant, chf, afib, htn, accidental methadone overdoses  Clinical Impression  Pt admitted with above diagnosis and presents to PT with functional limitations due to deficits listed below (See PT problem list). Pt needs skilled PT to maximize independence and safety to allow discharge to home with wife and aide. Will follow acutely but expect he will return to baseline quickly and won't need PT after DC.       Follow Up Recommendations No PT follow up;Supervision for mobility/OOB    Equipment Recommendations  None recommended by PT    Recommendations for Other Services       Precautions / Restrictions Precautions Precautions: Fall      Mobility  Bed Mobility Overal bed mobility: Needs Assistance Bed Mobility: Supine to Sit     Supine to sit: Supervision;HOB elevated     General bed mobility comments: Assist for lines. Incr time and effort  Transfers Overall transfer level: Needs assistance Equipment used: Rolling walker (2 wheeled) Transfers: Sit to/from Stand Sit to Stand: Min guard         General transfer comment: Assist for safety and lines. Incr time to rise  Ambulation/Gait Ambulation/Gait assistance: Min guard Gait Distance (Feet): 125 Feet Assistive device: Rolling walker (2 wheeled) Gait Pattern/deviations: Step-through pattern;Decreased stride length;Trunk flexed Gait velocity: decr Gait velocity interpretation: <1.31 ft/sec, indicative of household ambulator General Gait Details: Assist for lines/safety  Stairs            Wheelchair Mobility    Modified Rankin (Stroke Patients Only)       Balance Overall balance assessment: Needs assistance Sitting-balance  support: No upper extremity supported;Feet supported Sitting balance-Leahy Scale: Fair     Standing balance support: Bilateral upper extremity supported Standing balance-Leahy Scale: Poor Standing balance comment: walker and min guard for static standing                             Pertinent Vitals/Pain Pain Assessment: No/denies pain    Home Living Family/patient expects to be discharged to:: Private residence Living Arrangements: Spouse/significant other Available Help at Discharge: Family;Personal care attendant;Available 24 hours/day Type of Home: House Home Access: Stairs to enter Entrance Stairs-Rails: None Entrance Stairs-Number of Steps: 4 Home Layout: One level Home Equipment: Cane - single point;Walker - 2 wheels;Grab bars - tub/shower      Prior Function Level of Independence: Needs assistance   Gait / Transfers Assistance Needed: modified independent with cane or walker  ADL's / Homemaking Assistance Needed: [place] aide 2.5 hrs/day 7 days/wk assist with bathing and dressing        Hand Dominance        Extremity/Trunk Assessment   Upper Extremity Assessment Upper Extremity Assessment: Generalized weakness    Lower Extremity Assessment Lower Extremity Assessment: Generalized weakness       Communication   Communication: No difficulties  Cognition Arousal/Alertness: Awake/alert Behavior During Therapy: WFL for tasks assessed/performed Overall Cognitive Status: Within Functional Limits for tasks assessed                                        General Comments General comments (  skin integrity, edema, etc.): Pt amb on 6L of O2 with SpO2 89% with activity    Exercises     Assessment/Plan    PT Assessment Patient needs continued PT services  PT Problem List Decreased strength;Decreased activity tolerance;Decreased balance;Decreased mobility;Cardiopulmonary status limiting activity;Obesity       PT Treatment  Interventions DME instruction;Gait training;Functional mobility training;Therapeutic activities;Therapeutic exercise;Balance training;Patient/family education    PT Goals (Current goals can be found in the Care Plan section)  Acute Rehab PT Goals Patient Stated Goal: return home PT Goal Formulation: With patient Time For Goal Achievement: 02/16/19 Potential to Achieve Goals: Good    Frequency Min 3X/week   Barriers to discharge Inaccessible home environment stairs to enter    Co-evaluation               AM-PAC PT "6 Clicks" Mobility  Outcome Measure Help needed turning from your back to your side while in a flat bed without using bedrails?: None Help needed moving from lying on your back to sitting on the side of a flat bed without using bedrails?: A Little Help needed moving to and from a bed to a chair (including a wheelchair)?: A Little Help needed standing up from a chair using your arms (e.g., wheelchair or bedside chair)?: A Little Help needed to walk in hospital room?: A Little Help needed climbing 3-5 steps with a railing? : A Little 6 Click Score: 19    End of Session Equipment Utilized During Treatment: Gait belt Activity Tolerance: Patient tolerated treatment well Patient left: in chair;with call bell/phone within reach   PT Visit Diagnosis: Unsteadiness on feet (R26.81);Muscle weakness (generalized) (M62.81);Other abnormalities of gait and mobility (R26.89)    Time: [id number] PT Time Calculation (min) (ACUTE ONLY): 31 min   Charges:   PT Evaluation $PT Eval Moderate Complexity: 1 Mod PT Treatments $Gait Training: 8-22 mins        [place] Pager [phone] Office [place] 02/02/2019, 10:55 AM

## 2019-02-02 NOTE — Progress Notes (Signed)
PROGRESS NOTE    AJAVION FELTUS  [id number] DOB: 06-16-1955 DOA: 01/29/2019 PCP: Lanae Boast, FNP    Brief Narrative:  63 year old gentleman with prior history of stage III CKD status post kidney transplant more than 5 years ago currently on tacrolimus, multiple admissions for accidental methadone overdose, chronic diastolic heart failure, chronic atrial fibrillation on Eliquis, essential hypertension, poorly controlled insulin-dependent diabetes mellitus, reports that he is tapering his methadone dose and was found to be lethargic and was admitted to the hospital for altered mental status. He was admitted for evaluation of altered mental status and acute respiratory failure with hypoxia secondary to aspiration pneumonia.  Pt seen and examined at bedside today and he is on 5 LIT OF [place] oxygen.   Assessment & Plan:   Active Problems:   DM (diabetes mellitus), type 2, uncontrolled ([place])   Accelerated hypertension   Kidney transplant recipient   Atrial fibrillation, chronic   Chronic diastolic heart failure (HCC)   CKD (chronic kidney disease) stage 3, GFR 30-59 ml/min (HCC)   Acute encephalopathy   Unintentional methadone overdose (HCC)   Acute respiratory failure with hypoxia (HCC)   AKI (acute kidney injury) ([place])   Aspiration pneumonia (HCC)   Opiate overdose ([place])   Acute encephalopathy/unintentional methadone overdose Resolved. Methadone discontinued.    Acute respiratory failure with hypoxia secondary to aspiration pneumonia. Yesterday pt had an episode of respiratory distress and required NRB overnight and diuresis with IV lasix, suspect from methadone dose and fluid overload from the previous day.  He was weaned off NRB to [place] oxygen and he appears comfortable on 5 lit .  We have increased the dose of unasyn for aspiration pneumonia.  SLP evaluation cleared him to regular diet with thin liquids. Stopped the methadone use as per the patient request.  duonebs as needed  and repeat CXR in am.   Type 2 diabetes mellitus insulin-dependent   Patient  was on 70/30 insulin at home but was discontinued due to hypoglycemia. CBG (last 3)  Recent Labs    02/01/19 2114 02/02/19 0728 02/02/19 1123  GLUCAP 173* 201* 162*   CBGs have improved and we will restart his 70/30 insulin at 8 units twice daily. Resume SSI.     Hypertensive urgency Resolved after starting the patient on home meds.  Patient is on Coreg and nifedipine and  restarted his home medication clonidine 0.3 mg TID.  Prn hydralazine on board.    Hyperlipidemia Continue with Pravachol 40 mg   AKI on stage III CKD, status post renal transplant On tacrolimus and myfortic.  Patient's baseline creatinine of 1.8 , was elevated to 2.4--> 1.98-->2.04  Renal US is  negative for hydronephrosis.  Urine studies reviewed.  Urine output was not measured accurately.  Nephrology assistance requested for his worsening renal parameters and history of renal parameters.     Chronic atrial fibrillation Briefly tachycardic yesterday evening with hypoxia but this morning it appears to be rate controlled on Coreg, Eliquis for anticoagulation.  Mild anemia of chronic disease  Hemoglobin stable around 11.8.   DVT prophylaxis: Eliquis  code Status: Full code Family Communication: discussed with wife yesterday evening.  disposition Plan: pending improvement in the pneumonia and hypoxia   Consultants:   Nephrology Dr Augustin Coupe.   Procedures: None.  Antimicrobials: Unasyn since admission.   Subjective: Pt reports he feels much better than yesterday.  Breathing has improved. Was weaned off NRB to 5 lito f [place] oxygen.  No nausea, vomiting or abdominal  pain.  He does not want to take any methadone, so we will discontinue the dose.    Objective: Vitals:   02/02/19 0218 02/02/19 0405 02/02/19 0731 02/02/19 0842  BP:  128/77 133/80   Pulse: 88 81 82   Resp: 11 12 12    Temp:  98 F (36.7 C) 97.8 F (36.6  C)   TempSrc:  Axillary Oral   SpO2: 96% 96% 96% 94%  Weight:      Height:        Intake/Output Summary (Last 24 hours) at 02/02/2019 1230 Last data filed at 02/02/2019 0552 Gross per 24 hour  Intake 710 ml  Output 300 ml  Net 410 ml   Filed Weights   01/30/19 0311 01/31/19 0409  Weight: 99.8 kg 103 kg    Examination:  General exam: alert, and on 5 lit of [place] oxygen,  Respiratory system:  Coarse breath sounds bilaterally, diminished air entry at bases.  Cardiovascular system: s1s2, irregular, no JVD.  Gastrointestinal system:  Abdomen is soft , non tender non distended bowel sounds good.  Central nervous system: oriented to place , person and time, non focal.  Extremities: no pedal edema cyanosis or clubbing.  Skin: no rashes seen  Psychiatry:  Mood is appropriate.     Data Reviewed: I have personally reviewed following labs and imaging studies  CBC: Recent Labs  Lab 01/29/19 1624 01/30/19 0557 01/31/19 0638 02/01/19 0439 02/02/19 0451  WBC 11.4* 13.8* 7.1 7.5 6.8  NEUTROABS 10.0*  --   --   --   --   HGB 15.2 14.1 11.1* 12.8* 11.8*  HCT 48.9 46.8 36.3* 41.9 39.6  MCV 88.3 89.1 88.3 87.8 89.4  PLT 232 204 146* 177 [id number]   Basic Metabolic Panel: Recent Labs  Lab 01/30/19 0557 01/31/19 0638 02/01/19 0439 02/01/19 1722 02/02/19 0451  NA 138 145 136 135 134*  K 4.9 3.6 4.6 5.6* 5.1  CL 106 111 103 104 103  CO2 25 20* 23 23 23   GLUCOSE 105* 117* 156* 154* 152*  BUN 23 21 29* 29* 33*  CREATININE 1.95* 2.41* 1.98* 1.81* 2.08*  CALCIUM 8.5* 6.2* 8.5* 8.8* 8.5*   GFR: Estimated Creatinine Clearance: 43.7 mL/min (A) (by C-G formula based on SCr of 2.08 mg/dL (H)). Liver Function Tests: Recent Labs  Lab 01/29/19 1624  AST 17  ALT 13  ALKPHOS 66  BILITOT 0.7  PROT 6.9  ALBUMIN 3.5   Recent Labs  Lab 01/29/19 1624  LIPASE 35   No results for input(s): AMMONIA in the last 168 hours. Coagulation Profile: No results for input(s): INR, PROTIME in the  last 168 hours. Cardiac Enzymes: No results for input(s): CKTOTAL, CKMB, CKMBINDEX, TROPONINI in the last 168 hours. BNP (last 3 results) No results for input(s): PROBNP in the last 8760 hours. HbA1C: No results for input(s): HGBA1C in the last 72 hours. CBG: Recent Labs  Lab 02/01/19 1142 02/01/19 1719 02/01/19 2114 02/02/19 0728 02/02/19 1123  GLUCAP 152* 139* 173* 201* 162*   Lipid Profile: No results for input(s): CHOL, HDL, LDLCALC, TRIG, CHOLHDL, LDLDIRECT in the last 72 hours. Thyroid Function Tests: No results for input(s): TSH, T4TOTAL, FREET4, T3FREE, THYROIDAB in the last 72 hours. Anemia Panel: No results for input(s): VITAMINB12, FOLATE, FERRITIN, TIBC, IRON, RETICCTPCT in the last 72 hours. Sepsis Labs: No results for input(s): PROCALCITON, LATICACIDVEN in the last 168 hours.  Recent Results (from the past 240 hour(s))  SARS Coronavirus 2 [facility] ([facility]) order, Performed in  [facility] hospital lab) Nasopharyngeal Nasopharyngeal Swab     Status: None   Collection Time: 01/29/19  3:17 PM   Specimen: Nasopharyngeal Swab  Result Value Ref Range Status   SARS Coronavirus 2 NEGATIVE NEGATIVE Final    Comment: (NOTE) If result is NEGATIVE SARS-CoV-2 target nucleic acids are NOT DETECTED. The SARS-CoV-2 RNA is generally detectable in upper and lower  respiratory specimens during the acute phase of infection. The lowest  concentration of SARS-CoV-2 viral copies this assay can detect is 250  copies / mL. A negative result does not preclude SARS-CoV-2 infection  and should not be used as the sole basis for treatment or other  patient management decisions.  A negative result may occur with  improper specimen collection / handling, submission of specimen other  than nasopharyngeal swab, presence of viral mutation(s) within the  areas targeted by this assay, and inadequate number of viral copies  (<250 copies / mL). A negative result must be combined with clinical   observations, patient history, and epidemiological information. If result is POSITIVE SARS-CoV-2 target nucleic acids are DETECTED. The SARS-CoV-2 RNA is generally detectable in upper and lower  respiratory specimens dur ing the acute phase of infection.  Positive  results are indicative of active infection with SARS-CoV-2.  Clinical  correlation with patient history and other diagnostic information is  necessary to determine patient infection status.  Positive results do  not rule out bacterial infection or co-infection with other viruses. If result is PRESUMPTIVE POSTIVE SARS-CoV-2 nucleic acids MAY BE PRESENT.   A presumptive positive result was obtained on the submitted specimen  and confirmed on repeat testing.  While 2019 novel coronavirus  (SARS-CoV-2) nucleic acids may be present in the submitted sample  additional confirmatory testing may be necessary for epidemiological  and / or clinical management purposes  to differentiate between  SARS-CoV-2 and other Sarbecovirus currently known to infect humans.  If clinically indicated additional testing with an alternate test  methodology [phone]) is advised. The SARS-CoV-2 RNA is generally  detectable in upper and lower respiratory sp ecimens during the acute  phase of infection. The expected result is Negative. Fact Sheet for Patients:  StrictlyIdeas.no Fact Sheet for Healthcare Providers: BankingDealers.co.za This test is not yet approved or cleared by the [place] FDA and has been authorized for detection and/or diagnosis of SARS-CoV-2 by FDA under an Emergency Use Authorization (EUA).  This EUA will remain in effect (meaning this test can be used) for the duration of the COVID-19 declaration under Section 564(b)(1) of the Act, 21 U.S.C. section 360bbb-3(b)(1), unless the authorization is terminated or revoked sooner. Performed at [facility], [place] [address]  [place], [place] [address]          Radiology Studies: Dg Chest 2 View  Result Date: 02/01/2019 CLINICAL DATA:  Shortness of breath. Difficulty breathing. EXAM: CHEST - 2 VIEW COMPARISON:  01/29/2019 FINDINGS: There has been a significant progression of the infiltrate in the right upper lung zone with slight increased hazy infiltrates at both lung bases. Persistent cardiomegaly. Pulmonary vascularity is normal. Aortic atherosclerosis. On the lateral view there is suggestion of either effusions or posterior basal consolidation. IMPRESSION: 1. Progressive infiltrate in the right upper lung zone. 2. Increased hazy infiltrates at both lung bases. 3. Bilateral pleural effusions or posterior basal consolidation. 4. Cardiomegaly. 5. The findings could represent progressive pneumonia or progressive pulmonary edema. Electronically Signed   By: Lorriane Shire M.D.   On: 02/01/2019 11:21  Dg Chest Port 1 View  Result Date: 02/01/2019 CLINICAL DATA:  63 year old male with a history aspiration and hypoxia with shortness of breath EXAM: PORTABLE CHEST 1 VIEW COMPARISON:  February 01, 2019, January 29, 2019 FINDINGS: Cardiomediastinal silhouette unchanged with cardiomegaly. Similar appearance of mixed interstitial and airspace opacities bilaterally, with improving aeration in the upper lobes compared to the prior. Interlobular septal thickening. No pneumothorax.  No pleural effusion. IMPRESSION: Improving aeration in the upper lungs, with persisting mixed interstitial and airspace opacities. Cardiomegaly Electronically Signed   By: Corrie Mckusick D.O.   On: 02/01/2019 18:23        Scheduled Meds: . apixaban  5 mg Oral BID  . aspirin EC  81 mg Oral Daily  . carvedilol  25 mg Oral BID WC  . cloNIDine  0.3 mg Oral TID  . insulin aspart  0-9 Units Subcutaneous TID WC  . insulin aspart protamine- aspart  8 Units Subcutaneous BID WC  . magnesium oxide  400 mg Oral Daily  . methadone  20 mg Oral Daily  .  mycophenolate  540 mg Oral BID  . NIFEdipine  60 mg Oral Daily  . pantoprazole  40 mg Oral Daily  . pravastatin  40 mg Oral 3 times weekly  . pregabalin  100 mg Oral TID  . tacrolimus  2 mg Oral BID   Continuous Infusions: . ampicillin-sulbactam (UNASYN) IV       LOS: 4 days        Hosie Poisson, MD [facility] Pager [phone]  If 7PM-7AM, please contact night-coverage [email or web address] Password [email or web address] 02/02/2019, 12:30 PM
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
## 2019-02-03 LAB — BASIC METABOLIC PANEL
Anion gap: 10 (ref 5–15)
BUN: 33 mg/dL — ABNORMAL HIGH (ref 8–23)
CO2: 22 mmol/L (ref 22–32)
Calcium: 8.4 mg/dL — ABNORMAL LOW (ref 8.9–10.3)
Chloride: 103 mmol/L (ref 98–111)
Creatinine, Ser: 1.84 mg/dL — ABNORMAL HIGH (ref 0.61–1.24)
GFR calc Af Amer: 44 mL/min — ABNORMAL LOW (ref >60)
GFR calc non Af Amer: 38 mL/min — ABNORMAL LOW (ref >60)
Glucose, Bld: 183 mg/dL — ABNORMAL HIGH (ref 70–99)
Potassium: 4.6 mmol/L (ref 3.5–5.1)
Sodium: 135 mmol/L (ref 135–145)

## 2019-02-03 LAB — GLUCOSE, CAPILLARY
Glucose-Capillary: 125 mg/dL — ABNORMAL HIGH (ref 70–99)
Glucose-Capillary: 142 mg/dL — ABNORMAL HIGH (ref 70–99)
Glucose-Capillary: 171 mg/dL — ABNORMAL HIGH (ref 70–99)
Glucose-Capillary: 177 mg/dL — ABNORMAL HIGH (ref 70–99)
Glucose-Capillary: 157 mg/dL — ABNORMAL HIGH (ref 70–99)

## 2019-02-03 MED ORDER — FUROSEMIDE 10 MG/ML IJ SOLN
60.0000 mg | Freq: Once | INTRAMUSCULAR | Status: AC
  Administered 2019-02-03: 60 mg via INTRAVENOUS
  Filled 2019-02-03: qty 6

## 2019-02-03 MED ORDER — PREGABALIN 100 MG PO CAPS
100.0000 mg | ORAL_CAPSULE | Freq: Two times a day (BID) | ORAL | Status: DC
  Administered 2019-02-03 – 2019-02-05 (×4): 100 mg via ORAL
  Filled 2019-02-03 (×4): qty 1

## 2019-02-03 MED ORDER — LACTULOSE 10 GM/15ML PO SOLN
30.0000 g | Freq: Two times a day (BID) | ORAL | Status: DC | PRN
  Administered 2019-02-03 – 2019-02-04 (×3): 30 g via ORAL
  Filled 2019-02-03 (×3): qty 45

## 2019-02-03 NOTE — Progress Notes (Signed)
[facility] Progress Note   63 y.o. male cDHF, afib, HTN, DM, CKD III with a BL cr of 1.5 s/p transplant more than 5 years ago maintained on Prograf 2mg  bid, Myfortic 540mg  bid,   with prior history of methadone overdose p/w methadone overdose after trying to wean himself off. He was found unresponsive in the bathroom with sats int he 40s breathing 4x/min. In the ED he was noted to have a cr of 1.9 and started on Unasyn for possible aspiration pneumonia. He received Lasix 40/20 on 9/24. No hydro on u/s. Creatinine was 1.95 at admission and then peaked at 2.41 before decr to 1.98. @ time of consultation cr was 2.08.  Assessment/ Plan:    1. DDRT over 5 years ago with AKI likely a result of the aspiration PNA, hemodynamic compromise, fluctuating perfusion. @ baseline he has significant LE edema and is not always compliant with his diuretics. - Will check a tac trough but no new medications that would cause elevated levels. - Cont immunosuppresive meds @ home doses; he is relatively stable and certainly doesn't appear septic. - Creatinine fluctuation during an acute illness is not unusual and hopefully he will remain stable with improvement of function back to his baseline. - Dosed Lasix 60 mg IV x1 on 9/25 as there was e/o vol overload  I&O not accurate and he was voiding a large amt overnight. Will give another 60mg  IV x1 today; he has had a LARGE amt of swelling in the legs for many months so this will not re resolved during the hospitalization. Upon d/c  will need to esume 40mg  BID w/ f/u with [facility] in 2-4 weeks for possible uptitration of the diuretics + trending the weights.    2. Methadone overdose 3. Aspiration PNA 4. DM 5. HTN Subjective:   Feeling better. Denies dyspnea, f/c/n/v.   Objective:   BP 132/86 (BP Location: Right Arm)   Pulse 76   Temp (!) 97.5 F (36.4 C) (Oral)   Resp 20   Ht 5\' 10"  (1.778 m)   Wt 103 kg   SpO2 96%   BMI 32.58 kg/m    Intake/Output Summary (Last 24 hours) at 02/03/2019 1056 Last data filed at 02/03/2019 0900 Gross per 24 hour  Intake 460 ml  Output 1100 ml  Net -640 ml   Weight change:   Physical Exam: GEN: NAD, A&Ox3, NCAT HEENT: No conjunctival pallor, EOMI NECK: Supple, no thyromegaly LUNGS: CTA B/L no rales, rhonchi or wheezing CV: RRR, No M/R/G ABD: SNDNT +BS  EXT: 2-3+  lower extremity edema    Imaging: Dg Abd 1 View  Result Date: 02/02/2019 CLINICAL DATA:  Constipation, pain EXAM: ABDOMEN - 1 VIEW COMPARISON:  CT 06/06/2013 FINDINGS: Large stool burden throughout the colon. Nonobstructive bowel gas pattern. No free air, organomegaly or suspicious calcification. IMPRESSION: Large stool burden.  No evidence of bowel obstruction or free air. Electronically Signed   By: Rolm Baptise M.D.   On: 02/02/2019 19:25   Dg Chest Port 1 View  Result Date: 02/01/2019 CLINICAL DATA:  63 year old male with a history aspiration and hypoxia with shortness of breath EXAM: PORTABLE CHEST 1 VIEW COMPARISON:  February 01, 2019, January 29, 2019 FINDINGS: Cardiomediastinal silhouette unchanged with cardiomegaly. Similar appearance of mixed interstitial and airspace opacities bilaterally, with improving aeration in the upper lobes compared to the prior. Interlobular septal thickening. No pneumothorax.  No pleural effusion. IMPRESSION: Improving aeration in the upper lungs, with persisting mixed interstitial and  airspace opacities. Cardiomegaly Electronically Signed   By: Corrie Mckusick D.O.   On: 02/01/2019 18:23    Labs: BMET Recent Labs  Lab 01/29/19 1624 01/30/19 0557 01/31/19 [id number] 02/01/19 0439 02/01/19 1722 02/02/19 0451 02/03/19 1016  NA 142 138 145 136 135 134* 135  K 5.0 4.9 3.6 4.6 5.6* 5.1 4.6  CL 108 106 111 103 104 103 103  CO2 26 25 20* 23 23 23 22   GLUCOSE 82 105* 117* 156* 154* 152* 183*  BUN 22 23 21  29* 29* 33* 33*  CREATININE 1.90* 1.95* 2.41* 1.98* 1.81* 2.08* 1.84*  CALCIUM  8.9 8.5* 6.2* 8.5* 8.8* 8.5* 8.4*   CBC Recent Labs  Lab 01/29/19 1624 01/30/19 0557 01/31/19 0638 02/01/19 0439 02/02/19 0451  WBC 11.4* 13.8* 7.1 7.5 6.8  NEUTROABS 10.0*  --   --   --   --   HGB 15.2 14.1 11.1* 12.8* 11.8*  HCT 48.9 46.8 36.3* 41.9 39.6  MCV 88.3 89.1 88.3 87.8 89.4  PLT 232 204 146* 177 190    Medications:    . apixaban  5 mg Oral BID  . aspirin EC  81 mg Oral Daily  . carvedilol  25 mg Oral BID WC  . cloNIDine  0.3 mg Oral TID  . insulin aspart  0-9 Units Subcutaneous TID WC  . insulin aspart protamine- aspart  8 Units Subcutaneous BID WC  . magnesium oxide  400 mg Oral Daily  . mycophenolate  540 mg Oral BID  . NIFEdipine  60 mg Oral Daily  . pantoprazole  40 mg Oral Daily  . polyethylene glycol  17 g Oral BID  . pravastatin  40 mg Oral 3 times weekly  . pregabalin  100 mg Oral TID  . senna-docusate  2 tablet Oral BID  . tacrolimus  2 mg Oral BID      Otelia Santee, MD 02/03/2019, 10:56 AM

## 2019-02-03 NOTE — Progress Notes (Signed)
PROGRESS NOTE    [name]  [id number] DOB: [birth year] DOA: 01/29/2019 PCP: Lanae Boast, FNP    Brief Narrative:  63 year old gentleman with prior history of stage III CKD status post kidney transplant more than 5 years ago currently on tacrolimus, multiple admissions for accidental methadone overdose, chronic diastolic heart failure, chronic atrial fibrillation on Eliquis, essential hypertension, poorly controlled insulin-dependent diabetes mellitus, reports that he is tapering his methadone dose and was found to be lethargic and was admitted to the hospital for altered mental status. He was admitted for evaluation of altered mental status and acute respiratory failure with hypoxia secondary to aspiration pneumonia.  Pt seen and examined, his breathing has improved but still has leg edema.   Assessment & Plan:   Active Problems:   DM (diabetes mellitus), type 2, uncontrolled ([place])   Accelerated hypertension   Kidney transplant recipient   Atrial fibrillation, chronic   Chronic diastolic heart failure (HCC)   CKD (chronic kidney disease) stage 3, GFR 30-59 ml/min (HCC)   Acute encephalopathy   Unintentional methadone overdose (HCC)   Acute respiratory failure with hypoxia (HCC)   AKI (acute kidney injury) ([place])   Aspiration pneumonia (HCC)   Opiate overdose ([place])   Acute encephalopathy/unintentional methadone overdose Resolved. Methadone discontinued. Also decreased the lyrica dose and frequency.    Acute respiratory failure with hypoxia secondary to aspiration pneumonia. On 02/01/19,  pt had an episode of respiratory distress and required NRB overnight and diuresis with IV lasix, suspect from methadone dose and fluid overload from the previous day.  He was weaned off NRB to [place] oxygen , currently on 4l it of [place] oxygen We have increased the dose of unasyn for aspiration pneumonia.  SLP evaluation cleared him to regular diet with thin liquids. Stopped the methadone use  as per the patient request.  duonebs as needed.  Encouraged the patient to get OOB and ambulate.   Type 2 diabetes mellitus insulin-dependent   Patient  was on 70/30 insulin at home but was discontinued due to hypoglycemia. CBG (last 3)  Recent Labs    02/02/19 1123 02/02/19 1736 02/02/19 2127  GLUCAP 162* 213* 125*   CBGs have improved and we have restarted his 70/30 insulin at 8 units twice daily, increased to 10 units BID Resume SSI.     Hypertensive urgency Resolved after starting the patient on home meds.  Patient is on Coreg and nifedipine and  restarted his home medication clonidine 0.3 mg TID.  Prn hydralazine on board.  NO CHANGES in meds.    Hyperlipidemia Continue with Pravachol 40 mg   AKI on stage III CKD, status post renal transplant On tacrolimus and myfortic.  Patient's baseline creatinine of 1.8 , was elevated to 2.4--> 1.98-->2.04 --> 1.8,  Renal US is  negative for hydronephrosis.  Urine studies reviewed.  Urine output was not measured accurately.  Nephrology assistance requested for his worsening renal parameters and history of renal parameters.  IV lasix 60 mg daily ordered.     Chronic atrial fibrillation Briefly tachycardic yesterday evening with hypoxia but this morning it appears to be rate controlled on Coreg, Eliquis for anticoagulation. No changes to medications.    Mild anemia of chronic disease  Hemoglobin stable around 11.8.   DVT prophylaxis: Eliquis  code Status: Full code Family Communication: discussed with wifethis am.  disposition Plan: pending improvement in the pneumonia and hypoxia   Consultants:   Nephrology Dr Augustin Coupe.   Procedures: None.  Antimicrobials: Unasyn  since admission.   Subjective: Oxygen requirement improving. No chest pain. Has leg edema.  No nausea or vomiting.   Objective: Vitals:   02/03/19 0300 02/03/19 0748 02/03/19 1135 02/03/19 1608  BP: (!) 149/82 132/86 (!) 159/79 (!) 155/88  Pulse: 79 76  64 80  Resp: 16 20 20 20   Temp: 97.7 F (36.5 C) (!) 97.5 F (36.4 C) 98 F (36.7 C) 97.9 F (36.6 C)  TempSrc: Oral Oral Oral Oral  SpO2: 98% 96% 96% 94%  Weight:      Height:        Intake/Output Summary (Last 24 hours) at 02/03/2019 1632 Last data filed at 02/03/2019 1611 Gross per 24 hour  Intake 810 ml  Output 1950 ml  Net -1140 ml   Filed Weights   01/30/19 0311 01/31/19 0409  Weight: 99.8 kg 103 kg    Examination:  General exam: alert and appears comfortable.  Respiratory system:  Basilar rales , diminished at bases.  Cardiovascular system: s1s2, irregular, no JVD.  Gastrointestinal system:  abd is soft , non tender non distended bowel sounds good Central nervous system: alert and oriented. Non focal.  Extremities:  pedal edema, no cyanosis or clubbing.  Skin: no rashes or ulcers  Psychiatry:  Mood appropriate.     Data Reviewed: I have personally reviewed following labs and imaging studies  CBC: Recent Labs  Lab 01/29/19 1624 01/30/19 0557 01/31/19 0638 02/01/19 0439 02/02/19 0451  WBC 11.4* 13.8* 7.1 7.5 6.8  NEUTROABS 10.0*  --   --   --   --   HGB 15.2 14.1 11.1* 12.8* 11.8*  HCT 48.9 46.8 36.3* 41.9 39.6  MCV 88.3 89.1 88.3 87.8 89.4  PLT 232 204 146* 177 [id number]   Basic Metabolic Panel: Recent Labs  Lab 01/31/19 0638 02/01/19 0439 02/01/19 1722 02/02/19 0451 02/03/19 1016  NA 145 136 135 134* 135  K 3.6 4.6 5.6* 5.1 4.6  CL 111 103 104 103 103  CO2 20* 23 23 23 22   GLUCOSE 117* 156* 154* 152* 183*  BUN 21 29* 29* 33* 33*  CREATININE 2.41* 1.98* 1.81* 2.08* 1.84*  CALCIUM 6.2* 8.5* 8.8* 8.5* 8.4*   GFR: Estimated Creatinine Clearance: 49.4 mL/min (A) (by C-G formula based on SCr of 1.84 mg/dL (H)). Liver Function Tests: Recent Labs  Lab 01/29/19 1624  AST 17  ALT 13  ALKPHOS 66  BILITOT 0.7  PROT 6.9  ALBUMIN 3.5   Recent Labs  Lab 01/29/19 1624  LIPASE 35   No results for input(s): AMMONIA in the last 168 hours.  Coagulation Profile: No results for input(s): INR, PROTIME in the last 168 hours. Cardiac Enzymes: No results for input(s): CKTOTAL, CKMB, CKMBINDEX, TROPONINI in the last 168 hours. BNP (last 3 results) No results for input(s): PROBNP in the last 8760 hours. HbA1C: No results for input(s): HGBA1C in the last 72 hours. CBG: Recent Labs  Lab 02/01/19 2114 02/02/19 0728 02/02/19 1123 02/02/19 1736 02/02/19 2127  GLUCAP 173* 201* 162* 213* 125*   Lipid Profile: No results for input(s): CHOL, HDL, LDLCALC, TRIG, CHOLHDL, LDLDIRECT in the last 72 hours. Thyroid Function Tests: No results for input(s): TSH, T4TOTAL, FREET4, T3FREE, THYROIDAB in the last 72 hours. Anemia Panel: No results for input(s): VITAMINB12, FOLATE, FERRITIN, TIBC, IRON, RETICCTPCT in the last 72 hours. Sepsis Labs: No results for input(s): PROCALCITON, LATICACIDVEN in the last 168 hours.  Recent Results (from the past 240 hour(s))  SARS Coronavirus 2 [facility] order, Performed  in [place] lab) Nasopharyngeal Nasopharyngeal Swab     Status: None   Collection Time: 01/29/19  3:17 PM   Specimen: Nasopharyngeal Swab  Result Value Ref Range Status   SARS Coronavirus 2 NEGATIVE NEGATIVE Final    Comment: (NOTE) If result is NEGATIVE SARS-CoV-2 target nucleic acids are NOT DETECTED. The SARS-CoV-2 RNA is generally detectable in upper and lower  respiratory specimens during the acute phase of infection. The lowest  concentration of SARS-CoV-2 viral copies this assay can detect is 250  copies / mL. A negative result does not preclude SARS-CoV-2 infection  and should not be used as the sole basis for treatment or other  patient management decisions.  A negative result may occur with  improper specimen collection / handling, submission of specimen other  than nasopharyngeal swab, presence of viral mutation(s) within the  areas targeted by this assay, and inadequate number of viral copies  (<250 copies /  mL). A negative result must be combined with clinical  observations, patient history, and epidemiological information. If result is POSITIVE SARS-CoV-2 target nucleic acids are DETECTED. The SARS-CoV-2 RNA is generally detectable in upper and lower  respiratory specimens dur ing the acute phase of infection.  Positive  results are indicative of active infection with SARS-CoV-2.  Clinical  correlation with patient history and other diagnostic information is  necessary to determine patient infection status.  Positive results do  not rule out bacterial infection or co-infection with other viruses. If result is PRESUMPTIVE POSTIVE SARS-CoV-2 nucleic acids MAY BE PRESENT.   A presumptive positive result was obtained on the submitted specimen  and confirmed on repeat testing.  While 2019 novel coronavirus  (SARS-CoV-2) nucleic acids may be present in the submitted sample  additional confirmatory testing may be necessary for epidemiological  and / or clinical management purposes  to differentiate between  SARS-CoV-2 and other Sarbecovirus currently known to infect humans.  If clinically indicated additional testing with an alternate test  methodology [phone]) is advised. The SARS-CoV-2 RNA is generally  detectable in upper and lower respiratory sp ecimens during the acute  phase of infection. The expected result is Negative. Fact Sheet for Patients:  StrictlyIdeas.no Fact Sheet for Healthcare Providers: BankingDealers.co.za This test is not yet approved or cleared by the [place] FDA and has been authorized for detection and/or diagnosis of SARS-CoV-2 by FDA under an Emergency Use Authorization (EUA).  This EUA will remain in effect (meaning this test can be used) for the duration of the COVID-19 declaration under Section 564(b)(1) of the Act, 21 U.S.C. section 360bbb-3(b)(1), unless the authorization is terminated or revoked sooner.  Performed at [facility], [place] [address] [place],  [address]          Radiology Studies: Dg Abd 1 View  Result Date: 02/02/2019 CLINICAL DATA:  Constipation, pain EXAM: ABDOMEN - 1 VIEW COMPARISON:  CT 06/06/2013 FINDINGS: Large stool burden throughout the colon. Nonobstructive bowel gas pattern. No free air, organomegaly or suspicious calcification. IMPRESSION: Large stool burden.  No evidence of bowel obstruction or free air. Electronically Signed   By: Rolm Baptise M.D.   On: 02/02/2019 19:25   Dg Chest Port 1 View  Result Date: 02/01/2019 CLINICAL DATA:  63 year old male with a history aspiration and hypoxia with shortness of breath EXAM: PORTABLE CHEST 1 VIEW COMPARISON:  February 01, 2019, January 29, 2019 FINDINGS: Cardiomediastinal silhouette unchanged with cardiomegaly. Similar appearance of mixed interstitial and airspace opacities bilaterally, with improving  aeration in the upper lobes compared to the prior. Interlobular septal thickening. No pneumothorax.  No pleural effusion. IMPRESSION: Improving aeration in the upper lungs, with persisting mixed interstitial and airspace opacities. Cardiomegaly Electronically Signed   By: Corrie Mckusick D.O.   On: 02/01/2019 18:23        Scheduled Meds: . apixaban  5 mg Oral BID  . aspirin EC  81 mg Oral Daily  . carvedilol  25 mg Oral BID WC  . cloNIDine  0.3 mg Oral TID  . insulin aspart  0-9 Units Subcutaneous TID WC  . insulin aspart protamine- aspart  8 Units Subcutaneous BID WC  . magnesium oxide  400 mg Oral Daily  . mycophenolate  540 mg Oral BID  . NIFEdipine  60 mg Oral Daily  . pantoprazole  40 mg Oral Daily  . polyethylene glycol  17 g Oral BID  . pravastatin  40 mg Oral 3 times weekly  . pregabalin  100 mg Oral BID  . senna-docusate  2 tablet Oral BID  . tacrolimus  2 mg Oral BID   Continuous Infusions: . ampicillin-sulbactam (UNASYN) IV 3 g (02/03/19 1249)     LOS: 5 days        Hosie Poisson, MD [facility] Pager [phone]  If 7PM-7AM, please contact night-coverage [email or web address] Password [facility] 02/03/2019, 4:32 PM

## 2019-02-04 ENCOUNTER — Inpatient Hospital Stay (HOSPITAL_COMMUNITY): Payer: Medicare Other

## 2019-02-04 LAB — BASIC METABOLIC PANEL
Anion gap: 8 (ref 5–15)
BUN: 26 mg/dL — ABNORMAL HIGH (ref 8–23)
CO2: 25 mmol/L (ref 22–32)
Calcium: 8.9 mg/dL (ref 8.9–10.3)
Chloride: 102 mmol/L (ref 98–111)
Creatinine, Ser: 1.58 mg/dL — ABNORMAL HIGH (ref 0.61–1.24)
GFR calc Af Amer: 53 mL/min — ABNORMAL LOW (ref >60)
GFR calc non Af Amer: 46 mL/min — ABNORMAL LOW (ref >60)
Glucose, Bld: 194 mg/dL — ABNORMAL HIGH (ref 70–99)
Potassium: 4.5 mmol/L (ref 3.5–5.1)
Sodium: 135 mmol/L (ref 135–145)

## 2019-02-04 LAB — CBC
HCT: 40.3 % (ref 39.0–52.0)
Hemoglobin: 12.4 g/dL — ABNORMAL LOW (ref 13.0–17.0)
MCH: 26.4 pg (ref 26.0–34.0)
MCHC: 30.8 g/dL (ref 30.0–36.0)
MCV: 85.9 fL (ref 80.0–100.0)
Platelets: 226 10*3/uL (ref 150–400)
RBC: 4.69 MIL/uL (ref 4.22–5.81)
RDW: 14.4 % (ref 11.5–15.5)
WBC: 5.7 10*3/uL (ref 4.0–10.5)
nRBC: 0 % (ref 0.0–0.2)

## 2019-02-04 LAB — GLUCOSE, CAPILLARY
Glucose-Capillary: 168 mg/dL — ABNORMAL HIGH (ref 70–99)
Glucose-Capillary: 149 mg/dL — ABNORMAL HIGH (ref 70–99)
Glucose-Capillary: 178 mg/dL — ABNORMAL HIGH (ref 70–99)
Glucose-Capillary: 133 mg/dL — ABNORMAL HIGH (ref 70–99)

## 2019-02-04 LAB — TACROLIMUS LEVEL: Tacrolimus (FK506) - LabCorp: 15 ng/mL (ref 2.0–20.0)

## 2019-02-04 MED ORDER — FUROSEMIDE 40 MG PO TABS
40.0000 mg | ORAL_TABLET | Freq: Two times a day (BID) | ORAL | Status: DC
  Administered 2019-02-04 – 2019-02-05 (×2): 40 mg via ORAL
  Filled 2019-02-04 (×3): qty 1

## 2019-02-04 MED ORDER — INSULIN ASPART PROT & ASPART (70-30 MIX) 100 UNIT/ML ~~LOC~~ SUSP
10.0000 [IU] | Freq: Two times a day (BID) | SUBCUTANEOUS | Status: DC
  Administered 2019-02-04 – 2019-02-05 (×2): 10 [IU] via SUBCUTANEOUS
  Filled 2019-02-04: qty 10

## 2019-02-04 NOTE — Progress Notes (Signed)
PROGRESS NOTE    [name]  [id number] DOB: 1956/01/20 DOA: 01/29/2019 PCP: Lanae Boast, FNP    Brief Narrative:  63 year old gentleman with prior history of stage III CKD status post kidney transplant more than 5 years ago currently on tacrolimus, multiple admissions for accidental methadone overdose, chronic diastolic heart failure, chronic atrial fibrillation on Eliquis, essential hypertension, poorly controlled insulin-dependent diabetes mellitus, reports that he is tapering his methadone dose and was found to be lethargic and was admitted to the hospital for altered mental status. He was admitted for evaluation of altered mental status and acute respiratory failure with hypoxia secondary to aspiration pneumonia.  Pt seen and examined, his breathing has improved but still has leg edema.   Assessment & Plan:   Active Problems:   DM (diabetes mellitus), type 2, uncontrolled ([place])   Accelerated hypertension   Kidney transplant recipient   Atrial fibrillation, chronic   Chronic diastolic heart failure (HCC)   CKD (chronic kidney disease) stage 3, GFR 30-59 ml/min (HCC)   Acute encephalopathy   Unintentional methadone overdose (HCC)   Acute respiratory failure with hypoxia (HCC)   AKI (acute kidney injury) ([place])   Aspiration pneumonia (HCC)   Opiate overdose ([place])   Acute encephalopathy/unintentional methadone overdose Resolved. Methadone discontinued. Also decreased the lyrica dose and frequency.  He is alert and oriented .     Acute respiratory failure with hypoxia secondary to aspiration pneumonia. On 02/01/19,  pt had an episode of respiratory distress and required NRB overnight and diuresis with IV lasix, suspect from methadone dose and fluid overload from the previous day.  He was weaned off NRB to [place] oxygen , currently on 3l it of [place] oxygen please wean the oxygen as appropriate We have increased the dose of unasyn for aspiration pneumonia. Repeat CXR showing  much improvement in the infiltrates.  SLP evaluation cleared him to regular diet with thin liquids. Stopped the methadone use as per the patient request.  duonebs as needed.  Encouraged the patient to get OOB and ambulate.   Type 2 diabetes mellitus insulin-dependent   Patient  was on 70/30 insulin at home but was discontinued due to hypoglycemia. CBG (last 3)  Recent Labs    02/03/19 1610 02/03/19 2128 02/04/19 0754  GLUCAP 177* 157* 168*   CBGs have improved and we have restarted his 70/30 insulin at 8 units twice daily, increased to 10 units BID Resume SSI.     Hypertensive urgency Resolved after starting the patient on home meds.  Patient is on Coreg and nifedipine and  restarted his home medication clonidine 0.3 mg TID. Prn labetalol.     Hyperlipidemia Continue with Pravachol 40 mg   AKI on stage III CKD, status post renal transplant On tacrolimus and myfortic.  Patient's baseline creatinine of 1.8 , was elevated to 2.4--> 1.98-->2.04 --> 1.8, --> 1.5 Renal US is  negative for hydronephrosis.  Urine studies reviewed.  Urine output was not measured accurately.  Nephrology assistance requested for his worsening renal parameters and history of renal parameters.  Continue with lasix 40 mg BID.    Chronic atrial fibrillation Rate controlled on  Eliquis for anticoagulation. No changes to medications.     Constipation Senna Colace, MiraLAX and lactulose ordered.   Mild anemia of chronic disease  Hemoglobin stable around 11  DVT prophylaxis: Eliquis  code Status: Full code Family Communication: discussed with wifethis am.  disposition Plan: pending improvement in the pneumonia and hypoxia   Consultants:  Nephrology Dr Augustin Coupe.   Procedures: None.  Antimicrobials: Unasyn since admission.   Subjective: Oxygen requirement improving. No chest pain. Has leg edema.  No nausea or vomiting.   Objective: Vitals:   02/03/19 2200 02/03/19 2300 02/04/19 0300  02/04/19 0752  BP: (!) 160/77 (!) 153/93 (!) 154/89 (!) 142/94  Pulse: (!) 51 80 75 86  Resp:  18 16 20   Temp:  98.7 F (37.1 C) 98.7 F (37.1 C) 97.8 F (36.6 C)  TempSrc:  Oral Oral Oral  SpO2: 97% 98% 97% 90%  Weight:      Height:        Intake/Output Summary (Last 24 hours) at 02/04/2019 0914 Last data filed at 02/04/2019 0300 Gross per 24 hour  Intake 530 ml  Output 3140 ml  Net -2610 ml   Filed Weights   01/30/19 0311 01/31/19 0409  Weight: 99.8 kg 103 kg    Examination:  General exam: Alert and comfortable Respiratory system: Air entry fair, no wheezing or rhonchi Cardiovascular system: S1-S2 heard, irregular, no JVD Gastrointestinal system: Abdomen is soft, nontender, nondistended good bowel sounds  Central nervous system: Alert and oriented, nonfocal Extremities: Pedal edema present, no cyanosis or clubbing Skin: No rashes Psychiatry: Mood is appropriate    Data Reviewed: I have personally reviewed following labs and imaging studies  CBC: Recent Labs  Lab 01/29/19 1624 01/30/19 0557 01/31/19 0638 02/01/19 0439 02/02/19 0451  WBC 11.4* 13.8* 7.1 7.5 6.8  NEUTROABS 10.0*  --   --   --   --   HGB 15.2 14.1 11.1* 12.8* 11.8*  HCT 48.9 46.8 36.3* 41.9 39.6  MCV 88.3 89.1 88.3 87.8 89.4  PLT 232 204 146* 177 [id number]   Basic Metabolic Panel: Recent Labs  Lab 01/31/19 0638 02/01/19 0439 02/01/19 1722 02/02/19 0451 02/03/19 1016  NA 145 136 135 134* 135  K 3.6 4.6 5.6* 5.1 4.6  CL 111 103 104 103 103  CO2 20* 23 23 23 22   GLUCOSE 117* 156* 154* 152* 183*  BUN 21 29* 29* 33* 33*  CREATININE 2.41* 1.98* 1.81* 2.08* 1.84*  CALCIUM 6.2* 8.5* 8.8* 8.5* 8.4*   GFR: Estimated Creatinine Clearance: 49.4 mL/min (A) (by C-G formula based on SCr of 1.84 mg/dL (H)). Liver Function Tests: Recent Labs  Lab 01/29/19 1624  AST 17  ALT 13  ALKPHOS 66  BILITOT 0.7  PROT 6.9  ALBUMIN 3.5   Recent Labs  Lab 01/29/19 1624  LIPASE 35   No results for  input(s): AMMONIA in the last 168 hours. Coagulation Profile: No results for input(s): INR, PROTIME in the last 168 hours. Cardiac Enzymes: No results for input(s): CKTOTAL, CKMB, CKMBINDEX, TROPONINI in the last 168 hours. BNP (last 3 results) No results for input(s): PROBNP in the last 8760 hours. HbA1C: No results for input(s): HGBA1C in the last 72 hours. CBG: Recent Labs  Lab 02/03/19 0751 02/03/19 1138 02/03/19 1610 02/03/19 2128 02/04/19 0754  GLUCAP 142* 171* 177* 157* 168*   Lipid Profile: No results for input(s): CHOL, HDL, LDLCALC, TRIG, CHOLHDL, LDLDIRECT in the last 72 hours. Thyroid Function Tests: No results for input(s): TSH, T4TOTAL, FREET4, T3FREE, THYROIDAB in the last 72 hours. Anemia Panel: No results for input(s): VITAMINB12, FOLATE, FERRITIN, TIBC, IRON, RETICCTPCT in the last 72 hours. Sepsis Labs: No results for input(s): PROCALCITON, LATICACIDVEN in the last 168 hours.  Recent Results (from the past 240 hour(s))  SARS Coronavirus 2 [facility] order, Performed in [facility] hospital lab)  Nasopharyngeal Nasopharyngeal Swab     Status: None   Collection Time: 01/29/19  3:17 PM   Specimen: Nasopharyngeal Swab  Result Value Ref Range Status   SARS Coronavirus 2 NEGATIVE NEGATIVE Final    Comment: (NOTE) If result is NEGATIVE SARS-CoV-2 target nucleic acids are NOT DETECTED. The SARS-CoV-2 RNA is generally detectable in upper and lower  respiratory specimens during the acute phase of infection. The lowest  concentration of SARS-CoV-2 viral copies this assay can detect is 250  copies / mL. A negative result does not preclude SARS-CoV-2 infection  and should not be used as the sole basis for treatment or other  patient management decisions.  A negative result may occur with  improper specimen collection / handling, submission of specimen other  than nasopharyngeal swab, presence of viral mutation(s) within the  areas targeted by this assay, and  inadequate number of viral copies  (<250 copies / mL). A negative result must be combined with clinical  observations, patient history, and epidemiological information. If result is POSITIVE SARS-CoV-2 target nucleic acids are DETECTED. The SARS-CoV-2 RNA is generally detectable in upper and lower  respiratory specimens dur ing the acute phase of infection.  Positive  results are indicative of active infection with SARS-CoV-2.  Clinical  correlation with patient history and other diagnostic information is  necessary to determine patient infection status.  Positive results do  not rule out bacterial infection or co-infection with other viruses. If result is PRESUMPTIVE POSTIVE SARS-CoV-2 nucleic acids MAY BE PRESENT.   A presumptive positive result was obtained on the submitted specimen  and confirmed on repeat testing.  While 2019 novel coronavirus  (SARS-CoV-2) nucleic acids may be present in the submitted sample  additional confirmatory testing may be necessary for epidemiological  and / or clinical management purposes  to differentiate between  SARS-CoV-2 and other Sarbecovirus currently known to infect humans.  If clinically indicated additional testing with an alternate test  methodology [phone]) is advised. The SARS-CoV-2 RNA is generally  detectable in upper and lower respiratory sp ecimens during the acute  phase of infection. The expected result is Negative. Fact Sheet for Patients:  StrictlyIdeas.no Fact Sheet for Healthcare Providers: BankingDealers.co.za This test is not yet approved or cleared by the [place] FDA and has been authorized for detection and/or diagnosis of SARS-CoV-2 by FDA under an Emergency Use Authorization (EUA).  This EUA will remain in effect (meaning this test can be used) for the duration of the COVID-19 declaration under Section 564(b)(1) of the Act, 21 U.S.C. section 360bbb-3(b)(1), unless the  authorization is terminated or revoked sooner. Performed at [facility], [place] [address] [place], [place] [address]          Radiology Studies: Dg Abd 1 View  Result Date: 02/02/2019 CLINICAL DATA:  Constipation, pain EXAM: ABDOMEN - 1 VIEW COMPARISON:  CT 06/06/2013 FINDINGS: Large stool burden throughout the colon. Nonobstructive bowel gas pattern. No free air, organomegaly or suspicious calcification. IMPRESSION: Large stool burden.  No evidence of bowel obstruction or free air. Electronically Signed   By: Rolm Baptise M.D.   On: 02/02/2019 19:25        Scheduled Meds: . apixaban  5 mg Oral BID  . aspirin EC  81 mg Oral Daily  . carvedilol  25 mg Oral BID WC  . cloNIDine  0.3 mg Oral TID  . insulin aspart  0-9 Units Subcutaneous TID WC  . insulin aspart protamine- aspart  8 Units  Subcutaneous BID WC  . magnesium oxide  400 mg Oral Daily  . mycophenolate  540 mg Oral BID  . NIFEdipine  60 mg Oral Daily  . pantoprazole  40 mg Oral Daily  . polyethylene glycol  17 g Oral BID  . pravastatin  40 mg Oral 3 times weekly  . pregabalin  100 mg Oral BID  . senna-docusate  2 tablet Oral BID  . tacrolimus  2 mg Oral BID   Continuous Infusions: . ampicillin-sulbactam (UNASYN) IV 3 g (02/04/19 [id number])     LOS: 6 days        Hosie Poisson, MD [facility] Pager [phone]  If 7PM-7AM, please contact night-coverage [email or web address] Password [facility] 02/04/2019, 9:14 AM

## 2019-02-04 NOTE — Progress Notes (Signed)
[facility] Progress Note    63 y.o.malecDHF, afib, HTN, DM, CKD III with a BL cr of 1.5 s/p transplant more than 5 years ago maintained on Prograf 2mg  bid, Myfortic 540mg  bid, with prior history of methadone overdose p/w methadone overdose after trying to wean himself off. He was found unresponsive in the bathroom with sats int he 40s breathing 4x/min. In the ED he was noted to have a cr of 1.9 and started on Unasyn for possible aspiration pneumonia. He received Lasix 40/20 on 9/24. No hydro on u/s. Creatinine was 1.95 at admission and then peaked at 2.41 before decr to 1.98. @ time of consultation cr was 2.08.  Assessment/ Plan:   1. DDRT over 5 years ago with AKI likely a result of the aspiration PNA, hemodynamic compromise, fluctuating perfusion. @ baseline he has significant LE edema and is not always compliant with his diuretics. - Tac trough sendout but no new medications that would cause elevated levels. - Cont immunosuppresive meds @ home doses; he is relatively stable and certainly doesn't appear septic. - Creatinine fluctuation during an acute illness is not unusual and hopefully he will remain stable with improvement of function back to his baseline. - DosedLasix 60 mgIV x1on 9/25 as there was e/o vol overload  Responding nicely to Lasix  60mg  IV;  he has had a LARGE amt of swelling in the legs for many months so this will not re resolved during the hospitalization. Upon d/c  will need to esume 40mg  BID w/ f/u with [facility] in 2-4 weeks for possible uptitration of the diuretics + trending the weights.  Signing off at this time; please reconsult as needed.   2. Methadone overdose 3. Aspiration PNA 4. DM 5. HTN  Subjective:   Feeling better. Denies dyspnea, f/c/n/v.   Objective:   BP (!) 142/94 (BP Location: Right Arm)   Pulse 86   Temp 97.8 F (36.6 C) (Oral)   Resp 20   Ht 5\' 10"  (1.778 m)   Wt 103 kg   SpO2 90%   BMI 32.58 kg/m    Intake/Output Summary (Last 24 hours) at 02/04/2019 0759 Last data filed at 02/04/2019 0300 Gross per 24 hour  Intake 890 ml  Output 3290 ml  Net -2400 ml   Weight change:   Physical Exam: GEN: NAD, A&Ox3, NCAT HEENT: No conjunctival pallor, EOMI NECK: Supple, no thyromegaly LUNGS: CTA B/L no rales, rhonchi or wheezing CV: RRR, No M/R/G ABD: SNDNT +BS  EXT: 2-3+  lower extremity edema  Imaging: Dg Abd 1 View  Result Date: 02/02/2019 CLINICAL DATA:  Constipation, pain EXAM: ABDOMEN - 1 VIEW COMPARISON:  CT 06/06/2013 FINDINGS: Large stool burden throughout the colon. Nonobstructive bowel gas pattern. No free air, organomegaly or suspicious calcification. IMPRESSION: Large stool burden.  No evidence of bowel obstruction or free air. Electronically Signed   By: Rolm Baptise M.D.   On: 02/02/2019 19:25    Labs: BMET Recent Labs  Lab 01/29/19 1624 01/30/19 0557 01/31/19 [id number] 02/01/19 0439 02/01/19 1722 02/02/19 0451 02/03/19 1016  NA 142 138 145 136 135 134* 135  K 5.0 4.9 3.6 4.6 5.6* 5.1 4.6  CL 108 106 111 103 104 103 103  CO2 26 25 20* 23 23 23 22   GLUCOSE 82 105* 117* 156* 154* 152* 183*  BUN 22 23 21  29* 29* 33* 33*  CREATININE 1.90* 1.95* 2.41* 1.98* 1.81* 2.08* 1.84*  CALCIUM 8.9 8.5* 6.2* 8.5* 8.8* 8.5* 8.4*  CBC Recent Labs  Lab 01/29/19 1624 01/30/19 0557 01/31/19 0638 02/01/19 0439 02/02/19 0451  WBC 11.4* 13.8* 7.1 7.5 6.8  NEUTROABS 10.0*  --   --   --   --   HGB 15.2 14.1 11.1* 12.8* 11.8*  HCT 48.9 46.8 36.3* 41.9 39.6  MCV 88.3 89.1 88.3 87.8 89.4  PLT 232 204 146* 177 190    Medications:    . apixaban  5 mg Oral BID  . aspirin EC  81 mg Oral Daily  . carvedilol  25 mg Oral BID WC  . cloNIDine  0.3 mg Oral TID  . insulin aspart  0-9 Units Subcutaneous TID WC  . insulin aspart protamine- aspart  8 Units Subcutaneous BID WC  . magnesium oxide  400 mg Oral Daily  . mycophenolate  540 mg Oral BID  . NIFEdipine  60 mg Oral Daily  .  pantoprazole  40 mg Oral Daily  . polyethylene glycol  17 g Oral BID  . pravastatin  40 mg Oral 3 times weekly  . pregabalin  100 mg Oral BID  . senna-docusate  2 tablet Oral BID  . tacrolimus  2 mg Oral BID      Otelia Santee, MD 02/04/2019, 7:59 AM

## 2019-02-05 LAB — GLUCOSE, CAPILLARY: Glucose-Capillary: 93 mg/dL (ref 70–99)

## 2019-02-05 LAB — BASIC METABOLIC PANEL
Anion gap: 12 (ref 5–15)
BUN: 24 mg/dL — ABNORMAL HIGH (ref 8–23)
CO2: 25 mmol/L (ref 22–32)
Calcium: 9.1 mg/dL (ref 8.9–10.3)
Chloride: 101 mmol/L (ref 98–111)
Creatinine, Ser: 1.74 mg/dL — ABNORMAL HIGH (ref 0.61–1.24)
GFR calc Af Amer: 47 mL/min — ABNORMAL LOW (ref >60)
GFR calc non Af Amer: 41 mL/min — ABNORMAL LOW (ref >60)
Glucose, Bld: 182 mg/dL — ABNORMAL HIGH (ref 70–99)
Potassium: 4.1 mmol/L (ref 3.5–5.1)
Sodium: 138 mmol/L (ref 135–145)

## 2019-02-05 MED ORDER — IPRATROPIUM-ALBUTEROL 0.5-2.5 (3) MG/3ML IN SOLN: 3.0000 mL | Freq: Four times a day (QID) | RESPIRATORY_TRACT | 2 refills | Status: DC | PRN

## 2019-02-05 MED ORDER — FUROSEMIDE 10 MG/ML IJ SOLN: 20.0000 mg | Freq: Once | INTRAMUSCULAR | Status: DC

## 2019-02-05 MED ORDER — FUROSEMIDE 40 MG PO TABS: 40.0000 mg | ORAL_TABLET | Freq: Two times a day (BID) | ORAL | 2 refills | Status: DC

## 2019-02-05 MED ORDER — FUROSEMIDE 40 MG PO TABS
40.0000 mg | ORAL_TABLET | Freq: Once | ORAL | Status: AC
  Administered 2019-02-05: 40 mg via ORAL

## 2019-02-05 MED ORDER — SENNOSIDES-DOCUSATE SODIUM 8.6-50 MG PO TABS: 2.0000 | ORAL_TABLET | Freq: Two times a day (BID) | ORAL | Status: DC

## 2019-02-05 NOTE — Progress Notes (Signed)
Physical Therapy Treatment Patient Details Name: BUFORD BREMER MRN: [id number] DOB: [birth year] Today's Date: 02/05/2019    History of Present Illness Pt adm with acute encephalopathy/unintentional methadone overdose and acute resp failure with hypoxia. PMH - DM, ckd, kidney transplant, chf, afib, htn, accidental methadone overdoses    PT Comments    Pt doing well with mobility and no further PT needed.  Ready for dc from PT standpoint.     Follow Up Recommendations  No PT follow up;Supervision for mobility/OOB     Equipment Recommendations  None recommended by PT    Recommendations for Other Services       Precautions / Restrictions Precautions Precautions: Fall    Mobility  Bed Mobility Overal bed mobility: Independent                Transfers Overall transfer level: Independent   Transfers: Sit to/from Stand Sit to Stand: Modified independent (Device/Increase time)            Ambulation/Gait Ambulation/Gait assistance: Modified independent (Device/Increase time) Gait Distance (Feet): 180 Feet Assistive device: None Gait Pattern/deviations: Step-through pattern;Decreased stride length   Gait velocity interpretation: >2.62 ft/sec, indicative of community ambulatory General Gait Details: Steady gait   Stairs             Wheelchair Mobility    Modified Rankin (Stroke Patients Only)       Balance Overall balance assessment: Mild deficits observed, not formally tested                                          Cognition Arousal/Alertness: Awake/alert Behavior During Therapy: WFL for tasks assessed/performed Overall Cognitive Status: Within Functional Limits for tasks assessed                                        Exercises      General Comments General comments (skin integrity, edema, etc.): Pt amb on RA with SpO2 90% with amb      Pertinent Vitals/Pain Pain Assessment: No/denies pain    Home  Living                      Prior Function            PT Goals (current goals can now be found in the care plan section) Acute Rehab PT Goals Patient Stated Goal: return home Progress towards PT goals: Goals met/education completed, patient discharged from PT    Frequency    Min 3X/week      PT Plan Current plan remains appropriate    Co-evaluation              AM-PAC PT "6 Clicks" Mobility   Outcome Measure  Help needed turning from your back to your side while in a flat bed without using bedrails?: None Help needed moving from lying on your back to sitting on the side of a flat bed without using bedrails?: None Help needed moving to and from a bed to a chair (including a wheelchair)?: None Help needed standing up from a chair using your arms (e.g., wheelchair or bedside chair)?: None Help needed to walk in hospital room?: None Help needed climbing 3-5 steps with a railing? : None 6 Click Score: 24    End of  Session   Activity Tolerance: Patient tolerated treatment well Patient left: with call bell/phone within reach;in bed(sitting)   PT Visit Diagnosis: Unsteadiness on feet (R26.81);Muscle weakness (generalized) (M62.81);Other abnormalities of gait and mobility (R26.89)     Time: 7425-5258 PT Time Calculation (min) (ACUTE ONLY): 14 min  Charges:  $Gait Training: 8-22 mins                     [place] Pager [phone] Office [place] 02/05/2019, 1:14 PM

## 2019-02-05 NOTE — TOC Transition Note (Signed)
Transition of Care [facility]) - CM/SW Discharge Note   Patient Details  Name: [name] MRN: [id number] Date of Birth: 09/02/1955  [facility]) CM/SW Contact:  Maryclare Labrador, RN Phone Number: 02/05/2019, 11:13 AM   Clinical Narrative:    PTA independent from home.  Pt has active insurance, PCP and medication assistance.  Pt does not have nebulizer in the home - CM requested DME.  [employer] will deliver equipment bedside.     Final next level of care: Home/Self Care Barriers to Discharge: Barriers Resolved   Patient Goals and CMS Choice Patient states their goals for this hospitalization and ongoing recovery are:: to return home CMS Medicare.gov Compare Post Acute Care list provided to:: Patient Choice offered to / list presented to : Patient  Discharge Placement                       Discharge Plan and Services   Discharge Planning Services: CM Consult Post Acute Care Choice: Durable Medical Equipment          DME Arranged: Nebulizer machine DME Agency: [employer] Date DME Agency Contacted: 02/05/19 Time DME Agency Contacted: [id number] Representative spoke with at DME Agency: [place] Interventions     Readmission Risk Interventions No flowsheet data found.

## 2019-02-06 NOTE — Discharge Summary (Signed)
Physician Discharge Summary  [name] [id number] DOB: 1955/12/10 DOA: 01/29/2019  PCP: Lanae Boast, FNP  Admit date: 01/29/2019 Discharge date: 02/05/2019  Admitted From: Home.  Disposition:  Home.   Recommendations for Outpatient Follow-up:  1. Follow up with PCP in 1-2 weeks 2. Please obtain BMP/CBC in one week 3. Please follow up with nephrology in 2 weeks.    Discharge Condition:stable.  CODE STATUS: full code.  Diet recommendation: Heart Healthy   Brief/Interim Summary: 63 year old gentleman with prior history of stage III CKD status post kidney transplant more than 5 years ago currently on tacrolimus, multiple admissions for accidental methadone overdose, chronic diastolic heart failure, chronic atrial fibrillation on Eliquis, essential hypertension, poorly controlled insulin-dependent diabetes mellitus, reports that he is tapering his methadone dose and was found to be lethargic and was admitted to the hospital for altered mental status. He was admitted for evaluation of altered mental status and acute respiratory failure with hypoxia secondary to aspiration pneumonia. He completed 7 days of Unasyn for the pneumonia.  CXR shows improving pneumonia.   Discharge Diagnoses:  Active Problems:   DM (diabetes mellitus), type 2, uncontrolled ([place])   Accelerated hypertension   Kidney transplant recipient   Atrial fibrillation, chronic   Chronic diastolic heart failure (HCC)   CKD (chronic kidney disease) stage 3, GFR 30-59 ml/min (HCC)   Acute encephalopathy   Unintentional methadone overdose (HCC)   Acute respiratory failure with hypoxia (HCC)   AKI (acute kidney injury) ([place])   Aspiration pneumonia (HCC)   Opiate overdose ([place])  Acute encephalopathy/unintentional methadone overdose Resolved. Methadone discontinued. Also decreased the lyrica dose and frequency.  He is alert and oriented .     Acute respiratory failure with hypoxia secondary to aspiration  pneumonia. On 02/01/19,  pt had an episode of respiratory distress and required NRB overnight and diuresis with IV lasix, suspect from methadone dose and fluid overload from the previous day.  He was weaned off NRB to [place] oxygen , and off the oxygen on discharge.  Pt completed 7 days of IV anti biotics. Repeat CXR showing much improvement in the infiltrates.  SLP evaluation cleared him to regular diet with thin liquids. Stopped the methadone use as per the patient request.    Encouraged the patient to get OOB and ambulate.   Type 2 diabetes mellitus insulin-dependent  Called the patient and his wife on 9/29 and asked them to decrease the Novolog 70/30 to 10 units BID from 20 units BID ( which he was taking at home )  and to increase depending on the CBG'S and as per his PCP.  HIS A1C is 6.4.      Hypertensive urgency Resolved after starting the patient on home meds.  Patient is on Coreg and nifedipine and  restarted his home medication clonidine 0.3 mg TID. Prn labetalol.     Hyperlipidemia Continue with Pravachol 40 mg   AKI on stage III CKD, status post renal transplant On tacrolimus and myfortic.  Patient's baseline creatinine of 1.8 , was elevated to 2.4--> 1.98-->2.04 --> 1.8, --> 1.5 Renal US is  negative for hydronephrosis.  Urine studies reviewed.  Nephrology assistance requested for his worsening renal parameters and history of renal parameters.  Continue with lasix 40 mg BID on discharge.    Chronic atrial fibrillation Rate controlled on  Eliquis for anticoagulation. No changes to medications.     Constipation Senna Colace, MiraLAX and lactulose ordered.   Mild anemia of chronic disease  Hemoglobin  stable around 11    Discharge Instructions  Discharge Instructions    Diet - low sodium heart healthy   Complete by: As directed    Discharge instructions   Complete by: As directed    Please follow up with PCP in one week.  Please follow  up with nephrology in 2 weeks as recommended.     Allergies as of 02/05/2019      Reactions   Tramadol Hives, Itching, Rash      Medication List    STOP taking these medications   methadone 10 MG/5ML solution Commonly known as: DOLOPHINE     TAKE these medications   albuterol (2.5 MG/3ML) 0.083% nebulizer solution Commonly known as: PROVENTIL Take 3 mLs (2.5 mg total) by nebulization every 6 (six) hours as needed for wheezing or shortness of breath. What changed: Another medication with the same name was changed. Make sure you understand how and when to take each.   Ventolin HFA 108 (90 Base) MCG/ACT inhaler Generic drug: albuterol INHALE 2 PUFFS INTO THE LUNGS EVERY 6 HOURS AS NEEDED FOR WHEEZING OR SHORTNESS OF BREATH What changed: See the new instructions.   aspirin EC 81 MG tablet Take 81 mg by mouth daily.   carvedilol 25 MG tablet Commonly known as: COREG TAKE 1 TABLET(25 MG) BY MOUTH TWICE DAILY WITH A MEAL What changed: See the new instructions.   cloNIDine 0.3 MG tablet Commonly known as: CATAPRES Take 0.3 mg by mouth 3 (three) times daily.   Eliquis 5 MG Tabs tablet Generic drug: apixaban TAKE 1 TABLET(5 MG) BY MOUTH TWICE DAILY What changed: See the new instructions.   furosemide 40 MG tablet Commonly known as: LASIX Take 1 tablet (40 mg total) by mouth 2 (two) times daily. What changed:   medication strength  See the new instructions.   insulin aspart protamine- aspart (70-30) 100 UNIT/ML injection Commonly known as: NOVOLOG MIX 70/30 Injects 10 units every morning and injects 10 units every evening (follow up with your PCP for adjustments) What changed:   how much to take  how to take this  when to take this  additional instructions   ipratropium-albuterol 0.5-2.5 (3) MG/3ML Soln Commonly known as: DUONEB Take 3 mLs by nebulization every 6 (six) hours as needed.   magnesium oxide 400 MG tablet Commonly known as: MAG-OX Take 400 mg by  mouth daily.   mycophenolate 180 MG EC tablet Commonly known as: MYFORTIC Take 540 mg by mouth 2 (two) times daily.   NIFEdipine 60 MG 24 hr tablet Commonly known as: PROCARDIA XL/NIFEDICAL XL Take 60 mg by mouth daily.   omeprazole 40 MG capsule Commonly known as: PRILOSEC Take 40 mg by mouth daily before breakfast.   Poly-Iron 150 150 MG capsule Generic drug: iron polysaccharides Take 150 mg by mouth daily.   pravastatin 40 MG tablet Commonly known as: PRAVACHOL Take 40 mg by mouth 3 (three) times a week. Monday, Wed, Fri.   pregabalin 100 MG capsule Commonly known as: LYRICA Take 100 mg by mouth 3 (three) times daily. Unknown strength   senna-docusate 8.6-50 MG tablet Commonly known as: Senokot-S Take 2 tablets by mouth 2 (two) times daily.   Spacer/Aero Chamber Mouthpiece Misc 1 each by Does not apply route every 6 (six) hours as needed.   tacrolimus 1 MG capsule Commonly known as: PROGRAF Take 2 mg by mouth 2 (two) times daily.      Follow-up Information    Lanae Boast, [place]. Schedule an  appointment as soon as possible for a visit in 1 week(s).   Specialty: Family Medicine Contact information: [place] [address] [phone]        Buford Dresser, MD .   Specialty: Cardiology Contact information: [address] [place] [address] [phone]          Allergies  Allergen Reactions  . Tramadol Hives, Itching and Rash    Consultations:  Nephrology.    Procedures/Studies: Dg Chest 2 View  Result Date: 02/04/2019 CLINICAL DATA:  Follow-up. EXAM: CHEST - 2 VIEW COMPARISON:  Radiograph of February 01, 2019. FINDINGS: Stable cardiomegaly. Atherosclerosis of thoracic aorta is noted. No pneumothorax is noted. Small bilateral pleural effusions are noted. Decreased upper lobe opacities are noted suggesting improving inflammation. Bony thorax is unremarkable. IMPRESSION: Improved upper lobe opacities  as described above. Small bilateral pleural effusions. Aortic Atherosclerosis (ICD10-I70.0). Electronically Signed   By: Marijo Conception M.D.   On: 02/04/2019 09:23   Dg Chest 2 View  Result Date: 02/01/2019 CLINICAL DATA:  Shortness of breath. Difficulty breathing. EXAM: CHEST - 2 VIEW COMPARISON:  01/29/2019 FINDINGS: There has been a significant progression of the infiltrate in the right upper lung zone with slight increased hazy infiltrates at both lung bases. Persistent cardiomegaly. Pulmonary vascularity is normal. Aortic atherosclerosis. On the lateral view there is suggestion of either effusions or posterior basal consolidation. IMPRESSION: 1. Progressive infiltrate in the right upper lung zone. 2. Increased hazy infiltrates at both lung bases. 3. Bilateral pleural effusions or posterior basal consolidation. 4. Cardiomegaly. 5. The findings could represent progressive pneumonia or progressive pulmonary edema. Electronically Signed   By: Lorriane Shire M.D.   On: 02/01/2019 11:21   Dg Abd 1 View  Result Date: 02/02/2019 CLINICAL DATA:  Constipation, pain EXAM: ABDOMEN - 1 VIEW COMPARISON:  CT 06/06/2013 FINDINGS: Large stool burden throughout the colon. Nonobstructive bowel gas pattern. No free air, organomegaly or suspicious calcification. IMPRESSION: Large stool burden.  No evidence of bowel obstruction or free air. Electronically Signed   By: Rolm Baptise M.D.   On: 02/02/2019 19:25   US Renal  Result Date: 01/31/2019 CLINICAL DATA:  Acute renal injury.  Renal transplant 2015. EXAM: RENAL / URINARY TRACT ULTRASOUND COMPLETE COMPARISON:  CT 04/06/2014. FINDINGS: Right Kidney: Renal measurements: 8.0 x 4.8 x 4.6 cm = volume: 93.1 mL. Increased echogenicity with multiple tiny cyst largest of which measures 1.9 cm again noted. Similar findings noted on prior CT. Left Kidney: Renal measurements: Not visualized. Right lower quadrant transplant kidney measures 11.8 x 7.2 x 6.7 cm with a volume of  297.2 cc. No hydronephrosis. Bladder: Appears normal for degree of bladder distention. Incidental note made of a small left pleural effusion. IMPRESSION: 1. Small echogenic right kidney consistent chronic renal disease with multiple small cysts again noted. No interim change. Left kidney not visualized. 2. Right lower quadrant transplant kidney appears unremarkable. No hydronephrosis. No bladder distention. 3.  Incidental note made of small left pleural effusion. Electronically Signed   By: Marcello Moores  Register   On: 01/31/2019 13:06   Dg Chest Port 1 View  Result Date: 02/01/2019 CLINICAL DATA:  63 year old male with a history aspiration and hypoxia with shortness of breath EXAM: PORTABLE CHEST 1 VIEW COMPARISON:  February 01, 2019, January 29, 2019 FINDINGS: Cardiomediastinal silhouette unchanged with cardiomegaly. Similar appearance of mixed interstitial and airspace opacities bilaterally, with improving aeration in the upper lobes compared to the prior. Interlobular septal  thickening. No pneumothorax.  No pleural effusion. IMPRESSION: Improving aeration in the upper lungs, with persisting mixed interstitial and airspace opacities. Cardiomegaly Electronically Signed   By: Corrie Mckusick D.O.   On: 02/01/2019 18:23   Dg Chest Portable 1 View  Result Date: 01/29/2019 CLINICAL DATA:  Concern for aspiration EXAM: PORTABLE CHEST 1 VIEW COMPARISON:  None. FINDINGS: The heart is enlarged. Vascular calcifications are seen in the aortic arch. Mild patchy airspace opacities are seen in the bilateral mid and lower lungs. There is no pleural effusion or pneumothorax. The visualized skeletal structures are unremarkable. IMPRESSION: Mild patchy airspace opacities in the bilateral mid and lower lungs may reflect atelectasis and/or pneumonia. Electronically Signed   By: Zerita Boers M.D.   On: 01/29/2019 16:29       Subjective:  NO NEW COMPLAINTS.  Discharge Exam: Vitals:   02/05/19 0300 02/05/19 0748  BP:  140/80 (!) 155/87  Pulse: 78 85  Resp: 20 18  Temp: 98.2 F (36.8 C)   SpO2: 93% 92%   Vitals:   02/04/19 1630 02/04/19 2330 02/05/19 0300 02/05/19 0748  BP: 129/72 (!) 143/82 140/80 (!) 155/87  Pulse: 82 74 78 85  Resp: 20 20 20 18   Temp: 97.7 F (36.5 C) 98.2 F (36.8 C) 98.2 F (36.8 C)   TempSrc: Oral Oral Oral   SpO2: 90% 93% 93% 92%  Weight:      Height:        General: Pt is alert, awake, not in acute distress Cardiovascular: RRR, S1/S2 +, no rubs, no gallops Respiratory: CTA bilaterally, no wheezing, no rhonchi Abdominal: Soft, NT, ND, bowel sounds + Extremities: no edema, no cyanosis    The results of significant diagnostics from this hospitalization (including imaging, microbiology, ancillary and laboratory) are listed below for reference.     Microbiology: Recent Results (from the past 240 hour(s))  SARS Coronavirus 2 [facility] order, Performed in [facility] hospital lab) Nasopharyngeal Nasopharyngeal Swab     Status: None   Collection Time: 01/29/19  3:17 PM   Specimen: Nasopharyngeal Swab  Result Value Ref Range Status   SARS Coronavirus 2 NEGATIVE NEGATIVE Final    Comment: (NOTE) If result is NEGATIVE SARS-CoV-2 target nucleic acids are NOT DETECTED. The SARS-CoV-2 RNA is generally detectable in upper and lower  respiratory specimens during the acute phase of infection. The lowest  concentration of SARS-CoV-2 viral copies this assay can detect is 250  copies / mL. A negative result does not preclude SARS-CoV-2 infection  and should not be used as the sole basis for treatment or other  patient management decisions.  A negative result may occur with  improper specimen collection / handling, submission of specimen other  than nasopharyngeal swab, presence of viral mutation(s) within the  areas targeted by this assay, and inadequate number of viral copies  (<250 copies / mL). A negative result must be combined with clinical  observations, patient  history, and epidemiological information. If result is POSITIVE SARS-CoV-2 target nucleic acids are DETECTED. The SARS-CoV-2 RNA is generally detectable in upper and lower  respiratory specimens dur ing the acute phase of infection.  Positive  results are indicative of active infection with SARS-CoV-2.  Clinical  correlation with patient history and other diagnostic information is  necessary to determine patient infection status.  Positive results do  not rule out bacterial infection or co-infection with other viruses. If result is PRESUMPTIVE POSTIVE SARS-CoV-2 nucleic acids MAY BE PRESENT.   A presumptive positive  result was obtained on the submitted specimen  and confirmed on repeat testing.  While 2019 novel coronavirus  (SARS-CoV-2) nucleic acids may be present in the submitted sample  additional confirmatory testing may be necessary for epidemiological  and / or clinical management purposes  to differentiate between  SARS-CoV-2 and other Sarbecovirus currently known to infect humans.  If clinically indicated additional testing with an alternate test  methodology [phone]) is advised. The SARS-CoV-2 RNA is generally  detectable in upper and lower respiratory sp ecimens during the acute  phase of infection. The expected result is Negative. Fact Sheet for Patients:  StrictlyIdeas.no Fact Sheet for Healthcare Providers: BankingDealers.co.za This test is not yet approved or cleared by the [place] FDA and has been authorized for detection and/or diagnosis of SARS-CoV-2 by FDA under an Emergency Use Authorization (EUA).  This EUA will remain in effect (meaning this test can be used) for the duration of the COVID-19 declaration under Section 564(b)(1) of the Act, 21 U.S.C. section 360bbb-3(b)(1), unless the authorization is terminated or revoked sooner. Performed at [facility], [place] [address], [place], [place] [address]       Labs: BNP (last 3 results) Recent Labs    05/23/18 1125  BNP [id number]   Basic Metabolic Panel: Recent Labs  Lab 02/01/19 1722 02/02/19 0451 02/03/19 1016 02/04/19 0920 02/05/19 0457  NA 135 134* 135 135 138  K 5.6* 5.1 4.6 4.5 4.1  CL 104 103 103 102 101  CO2 23 23 22 25 25   GLUCOSE 154* 152* 183* 194* 182*  BUN 29* 33* 33* 26* 24*  CREATININE 1.81* 2.08* 1.84* 1.58* 1.74*  CALCIUM 8.8* 8.5* 8.4* 8.9 9.1   Liver Function Tests: No results for input(s): AST, ALT, ALKPHOS, BILITOT, PROT, ALBUMIN in the last 168 hours. No results for input(s): LIPASE, AMYLASE in the last 168 hours. No results for input(s): AMMONIA in the last 168 hours. CBC: Recent Labs  Lab 01/31/19 0638 02/01/19 0439 02/02/19 0451 02/04/19 0920  WBC 7.1 7.5 6.8 5.7  HGB 11.1* 12.8* 11.8* 12.4*  HCT 36.3* 41.9 39.6 40.3  MCV 88.3 87.8 89.4 85.9  PLT 146* 177 190 226   Cardiac Enzymes: No results for input(s): CKTOTAL, CKMB, CKMBINDEX, TROPONINI in the last 168 hours. BNP: Invalid input(s): POCBNP CBG: Recent Labs  Lab 02/04/19 0754 02/04/19 1140 02/04/19 1633 02/04/19 2133 02/05/19 1219  GLUCAP 168* 149* 178* 133* 93   D-Dimer No results for input(s): DDIMER in the last 72 hours. Hgb A1c No results for input(s): HGBA1C in the last 72 hours. Lipid Profile No results for input(s): CHOL, HDL, LDLCALC, TRIG, CHOLHDL, LDLDIRECT in the last 72 hours. Thyroid function studies No results for input(s): TSH, T4TOTAL, T3FREE, THYROIDAB in the last 72 hours.  Invalid input(s): FREET3 Anemia work up No results for input(s): VITAMINB12, FOLATE, FERRITIN, TIBC, IRON, RETICCTPCT in the last 72 hours. Urinalysis    Component Value Date/Time   COLORURINE YELLOW 01/31/2019 1245   APPEARANCEUR CLEAR 01/31/2019 1245   LABSPEC 1.016 01/31/2019 1245   PHURINE 5.0 01/31/2019 1245   GLUCOSEU 50 (A) 01/31/2019 1245   HGBUR MODERATE (A) 01/31/2019 1245   BILIRUBINUR NEGATIVE 01/31/2019 1245    BILIRUBINUR neg 11/09/2018 1338   KETONESUR NEGATIVE 01/31/2019 1245   PROTEINUR >=300 (A) 01/31/2019 1245   UROBILINOGEN 0.2 11/09/2018 1338   UROBILINOGEN 0.2 07/25/2017 1004   NITRITE NEGATIVE 01/31/2019 1245   LEUKOCYTESUR NEGATIVE 01/31/2019 1245   Sepsis Labs Invalid input(s): PROCALCITONIN,  WBC,  [place] Microbiology Recent Results (from the past 240 hour(s))  SARS Coronavirus 2 [facility] order, Performed in [facility] hospital lab) Nasopharyngeal Nasopharyngeal Swab     Status: None   Collection Time: 01/29/19  3:17 PM   Specimen: Nasopharyngeal Swab  Result Value Ref Range Status   SARS Coronavirus 2 NEGATIVE NEGATIVE Final    Comment: (NOTE) If result is NEGATIVE SARS-CoV-2 target nucleic acids are NOT DETECTED. The SARS-CoV-2 RNA is generally detectable in upper and lower  respiratory specimens during the acute phase of infection. The lowest  concentration of SARS-CoV-2 viral copies this assay can detect is 250  copies / mL. A negative result does not preclude SARS-CoV-2 infection  and should not be used as the sole basis for treatment or other  patient management decisions.  A negative result may occur with  improper specimen collection / handling, submission of specimen other  than nasopharyngeal swab, presence of viral mutation(s) within the  areas targeted by this assay, and inadequate number of viral copies  (<250 copies / mL). A negative result must be combined with clinical  observations, patient history, and epidemiological information. If result is POSITIVE SARS-CoV-2 target nucleic acids are DETECTED. The SARS-CoV-2 RNA is generally detectable in upper and lower  respiratory specimens dur ing the acute phase of infection.  Positive  results are indicative of active infection with SARS-CoV-2.  Clinical  correlation with patient history and other diagnostic information is  necessary to determine patient infection status.  Positive results do  not rule  out bacterial infection or co-infection with other viruses. If result is PRESUMPTIVE POSTIVE SARS-CoV-2 nucleic acids MAY BE PRESENT.   A presumptive positive result was obtained on the submitted specimen  and confirmed on repeat testing.  While 2019 novel coronavirus  (SARS-CoV-2) nucleic acids may be present in the submitted sample  additional confirmatory testing may be necessary for epidemiological  and / or clinical management purposes  to differentiate between  SARS-CoV-2 and other Sarbecovirus currently known to infect humans.  If clinically indicated additional testing with an alternate test  methodology [phone]) is advised. The SARS-CoV-2 RNA is generally  detectable in upper and lower respiratory sp ecimens during the acute  phase of infection. The expected result is Negative. Fact Sheet for Patients:  StrictlyIdeas.no Fact Sheet for Healthcare Providers: BankingDealers.co.za This test is not yet approved or cleared by the [place] FDA and has been authorized for detection and/or diagnosis of SARS-CoV-2 by FDA under an Emergency Use Authorization (EUA).  This EUA will remain in effect (meaning this test can be used) for the duration of the COVID-19 declaration under Section 564(b)(1) of the Act, 21 U.S.C. section 360bbb-3(b)(1), unless the authorization is terminated or revoked sooner. Performed at [facility], [place] [address], [place], [place] [address]      Time coordinating discharge: 32 minutes  SIGNED:   Hosie Poisson, MD  [facility] 02/06/2019, 8:52 AM Pager   If 7PM-7AM, please contact night-coverage [email or web address] Password [email or web address]

## 2019-02-07 LAB — GLUCOSE, CAPILLARY: Glucose-Capillary: 162 mg/dL — ABNORMAL HIGH (ref 70–99)

## 2019-02-08 ENCOUNTER — Telehealth: Payer: Self-pay

## 2019-02-09 ENCOUNTER — Other Ambulatory Visit: Payer: Self-pay

## 2019-02-09 ENCOUNTER — Ambulatory Visit (INDEPENDENT_AMBULATORY_CARE_PROVIDER_SITE_OTHER): Payer: Medicare Other | Admitting: Family Medicine

## 2019-02-09 VITALS — BP 131/81 | HR 84 | Temp 98.0°F | Ht 70.0 in | Wt 220.0 lb

## 2019-02-09 DIAGNOSIS — R809 Proteinuria, unspecified: Secondary | ICD-10-CM

## 2019-02-09 DIAGNOSIS — Z940 Kidney transplant status: Secondary | ICD-10-CM | POA: Diagnosis not present

## 2019-02-09 DIAGNOSIS — E559 Vitamin D deficiency, unspecified: Secondary | ICD-10-CM | POA: Diagnosis not present

## 2019-02-09 DIAGNOSIS — E1165 Type 2 diabetes mellitus with hyperglycemia: Secondary | ICD-10-CM

## 2019-02-09 DIAGNOSIS — Z1211 Encounter for screening for malignant neoplasm of colon: Secondary | ICD-10-CM | POA: Diagnosis not present

## 2019-02-09 DIAGNOSIS — E538 Deficiency of other specified B group vitamins: Secondary | ICD-10-CM

## 2019-02-09 DIAGNOSIS — R7989 Other specified abnormal findings of blood chemistry: Secondary | ICD-10-CM | POA: Diagnosis not present

## 2019-02-09 DIAGNOSIS — Z09 Encounter for follow-up examination after completed treatment for conditions other than malignant neoplasm: Secondary | ICD-10-CM | POA: Diagnosis not present

## 2019-02-09 DIAGNOSIS — Z125 Encounter for screening for malignant neoplasm of prostate: Secondary | ICD-10-CM | POA: Diagnosis not present

## 2019-02-09 LAB — GLUCOSE, POCT (MANUAL RESULT ENTRY): POC Glucose: 128 mg/dl — AB (ref 70–99)

## 2019-02-09 LAB — POCT URINALYSIS DIPSTICK
Appearance: NEGATIVE
Bilirubin, UA: NEGATIVE
Glucose, UA: NEGATIVE
Ketones, UA: NEGATIVE
Leukocytes, UA: NEGATIVE
Protein, UA: POSITIVE — AB
Spec Grav, UA: 1.02 (ref 1.010–1.025)
Urobilinogen, UA: 2 E.U./dL — AB
pH, UA: 7 (ref 5.0–8.0)

## 2019-02-09 MED ORDER — NOVOLOG MIX 70/30 FLEXPEN (70-30) 100 UNIT/ML ~~LOC~~ SUPN: PEN_INJECTOR | SUBCUTANEOUS | 11 refills | Status: DC
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
## 2019-02-09 NOTE — Telephone Encounter (Signed)
Created in error

## 2019-02-10 LAB — CBC WITH DIFFERENTIAL/PLATELET
Basophils Absolute: 0 10*3/uL (ref 0.0–0.2)
Basos: 1 %
EOS (ABSOLUTE): 0.4 10*3/uL (ref 0.0–0.4)
Eos: 6 %
Hematocrit: 48.5 % (ref 37.5–51.0)
Hemoglobin: 15.7 g/dL (ref 13.0–17.7)
Immature Grans (Abs): 0 10*3/uL (ref 0.0–0.1)
Immature Granulocytes: 1 %
Lymphocytes Absolute: 0.9 10*3/uL (ref 0.7–3.1)
Lymphs: 15 %
MCH: 26.3 pg — ABNORMAL LOW (ref 26.6–33.0)
MCHC: 32.4 g/dL (ref 31.5–35.7)
MCV: 81 fL (ref 79–97)
Monocytes Absolute: 0.5 10*3/uL (ref 0.1–0.9)
Monocytes: 8 %
Neutrophils Absolute: 4.1 10*3/uL (ref 1.4–7.0)
Neutrophils: 69 %
Platelets: 373 10*3/uL (ref 150–450)
RBC: 5.96 x10E6/uL — ABNORMAL HIGH (ref 4.14–5.80)
RDW: 14.9 % (ref 11.6–15.4)
WBC: 5.9 10*3/uL (ref 3.4–10.8)

## 2019-02-10 LAB — LIPID PANEL
Chol/HDL Ratio: 3.1 ratio (ref 0.0–5.0)
Cholesterol, Total: 168 mg/dL (ref 100–199)
HDL: 55 mg/dL (ref >39)
LDL Chol Calc (NIH): 85 mg/dL (ref 0–99)
Triglycerides: 163 mg/dL — ABNORMAL HIGH (ref 0–149)
VLDL Cholesterol Cal: 28 mg/dL (ref 5–40)

## 2019-02-10 LAB — COMPREHENSIVE METABOLIC PANEL
ALT: 10 IU/L (ref 0–44)
AST: 18 IU/L (ref 0–40)
Albumin/Globulin Ratio: 1.2 (ref 1.2–2.2)
Albumin: 3.8 g/dL (ref 3.8–4.8)
Alkaline Phosphatase: 86 IU/L (ref 39–117)
BUN/Creatinine Ratio: 11 (ref 10–24)
BUN: 19 mg/dL (ref 8–27)
Bilirubin Total: 0.4 mg/dL (ref 0.0–1.2)
CO2: 22 mmol/L (ref 20–29)
Calcium: 9.7 mg/dL (ref 8.6–10.2)
Chloride: 101 mmol/L (ref 96–106)
Creatinine, Ser: 1.78 mg/dL — ABNORMAL HIGH (ref 0.76–1.27)
GFR calc Af Amer: 46 mL/min/{1.73_m2} — ABNORMAL LOW (ref >59)
GFR calc non Af Amer: 40 mL/min/{1.73_m2} — ABNORMAL LOW (ref >59)
Globulin, Total: 3.2 g/dL (ref 1.5–4.5)
Glucose: 108 mg/dL — ABNORMAL HIGH (ref 65–99)
Potassium: 4.5 mmol/L (ref 3.5–5.2)
Sodium: 138 mmol/L (ref 134–144)
Total Protein: 7 g/dL (ref 6.0–8.5)

## 2019-02-10 LAB — VITAMIN D 25 HYDROXY (VIT D DEFICIENCY, FRACTURES): Vit D, 25-Hydroxy: 8.8 ng/mL — ABNORMAL LOW (ref 30.0–100.0)

## 2019-02-10 LAB — MICROALBUMIN / CREATININE URINE RATIO
Creatinine, Urine: 105.8 mg/dL
Microalb/Creat Ratio: 2191 mg/g creat — ABNORMAL HIGH (ref 0–29)
Microalbumin, Urine: 2318.1 ug/mL

## 2019-02-10 LAB — VITAMIN B12: Vitamin B-12: 359 pg/mL (ref 232–1245)

## 2019-02-10 LAB — TSH: TSH: 2.56 u[IU]/mL (ref 0.450–4.500)

## 2019-02-11 ENCOUNTER — Encounter: Payer: Self-pay | Admitting: Family Medicine

## 2019-02-11 DIAGNOSIS — R7989 Other specified abnormal findings of blood chemistry: Secondary | ICD-10-CM | POA: Insufficient documentation

## 2019-02-11 DIAGNOSIS — R809 Proteinuria, unspecified: Secondary | ICD-10-CM | POA: Insufficient documentation

## 2019-02-12 ENCOUNTER — Encounter: Payer: Self-pay | Admitting: Family Medicine

## 2019-02-12 ENCOUNTER — Other Ambulatory Visit: Payer: Self-pay | Admitting: Family Medicine

## 2019-02-12 ENCOUNTER — Telehealth: Payer: Self-pay

## 2019-02-12 DIAGNOSIS — R809 Proteinuria, unspecified: Secondary | ICD-10-CM

## 2019-02-12 DIAGNOSIS — R7989 Other specified abnormal findings of blood chemistry: Secondary | ICD-10-CM

## 2019-02-12 DIAGNOSIS — E559 Vitamin D deficiency, unspecified: Secondary | ICD-10-CM

## 2019-02-12 MED ORDER — VITAMIN D (ERGOCALCIFEROL) 1.25 MG (50000 UNIT) PO CAPS: 50000.0000 [IU] | ORAL_CAPSULE | ORAL | 3 refills | Status: DC

## 2019-02-12 NOTE — Telephone Encounter (Signed)
Called and spoke with patient. Made aware of Microalbumin being elevated. He already sees Nephrologist and has a follow up with them on 03/13/2019. Advised that cholesterol levels are elevated and that he should eat low-fat, low cholesterol diet, increase fluids, eat more fruits and vegetables. Asked that he decreased high sodium intake, alcohol, smoking, and increase physical activity of at least 30 minutes each day. Informed that Vitamin D is extremely low and that rx for vitamin D has been sent to pharmacy today. Asked that he include foods high in vitamin D and gave examples. Asked that he keep next follow up appointment. Thanks!

## 2019-02-12 NOTE — Telephone Encounter (Signed)
-----   Message from Azzie Glatter, FNP sent at 02/12/2019  9:31 AM EDT ----- Microal/Creat Ratio is elevated. We will refer patient to Nephrologist today.   Cholesterol levels are mildly elevated. Continue low-fat, low cholesterol diet, increase fluids, increase fruits and vegetables. He will continue to decrease high sodium intake, excessive alcohol intake, increase potassium intake, smoking cessation, and increase physical activity of at least 30 minutes of cardio activity daily. He will continue to follow Heart Healthy or DASH diet.  Vitamin D level is extremely low. Rx for Vitamin D sent to pharmacy today. He should include foods that are high in Vitamin D. These include: Salmon, Cod Liver Oil, Mushrooms, Canned Fish, Milk, and Egg Yolks.  He will keep follow up appointment as scheduled. Please inform patient. Thank you.

## 2019-02-12 NOTE — Telephone Encounter (Signed)
Refill request for Lyrica. Please advise  

## 2019-02-13 ENCOUNTER — Telehealth: Payer: Self-pay | Admitting: Internal Medicine

## 2019-02-13 NOTE — Telephone Encounter (Signed)
Also the pt would like a refill on Lyrica

## 2019-02-14 ENCOUNTER — Telehealth: Payer: Self-pay | Admitting: Internal Medicine

## 2019-02-14 ENCOUNTER — Other Ambulatory Visit: Payer: Self-pay | Admitting: Internal Medicine

## 2019-02-14 DIAGNOSIS — R0600 Dyspnea, unspecified: Secondary | ICD-10-CM

## 2019-02-14 DIAGNOSIS — R0609 Other forms of dyspnea: Secondary | ICD-10-CM

## 2019-02-14 MED ORDER — PREGABALIN 100 MG PO CAPS: 100.0000 mg | ORAL_CAPSULE | Freq: Three times a day (TID) | ORAL | 0 refills | Status: DC

## 2019-02-14 MED ORDER — IPRATROPIUM-ALBUTEROL 0.5-2.5 (3) MG/3ML IN SOLN: 3.0000 mL | Freq: Four times a day (QID) | RESPIRATORY_TRACT | 2 refills | Status: DC | PRN

## 2019-02-14 MED ORDER — ALBUTEROL SULFATE (2.5 MG/3ML) 0.083% IN NEBU: 2.5000 mg | INHALATION_SOLUTION | Freq: Four times a day (QID) | RESPIRATORY_TRACT | 1 refills | Status: DC | PRN

## 2019-02-14 NOTE — Telephone Encounter (Signed)
Refilled

## 2019-02-14 NOTE — Telephone Encounter (Signed)
Called and spoke with pharmacy. This is prescribed by Dr. Princella Pellegrini and she has refilled it today 02/14/2019. Thanks!

## 2019-03-06 ENCOUNTER — Encounter: Payer: Self-pay | Admitting: Vascular Surgery

## 2019-03-06 ENCOUNTER — Other Ambulatory Visit: Payer: Self-pay

## 2019-03-06 ENCOUNTER — Ambulatory Visit (INDEPENDENT_AMBULATORY_CARE_PROVIDER_SITE_OTHER): Payer: Medicare Other | Admitting: Vascular Surgery

## 2019-03-06 VITALS — BP 144/81 | HR 87 | Resp 18 | Ht 70.0 in | Wt 215.0 lb

## 2019-03-06 DIAGNOSIS — I872 Venous insufficiency (chronic) (peripheral): Secondary | ICD-10-CM | POA: Diagnosis not present

## 2019-03-06 DIAGNOSIS — I890 Lymphedema, not elsewhere classified: Secondary | ICD-10-CM | POA: Diagnosis not present
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
## 2019-03-23 DIAGNOSIS — N2889 Other specified disorders of kidney and ureter: Secondary | ICD-10-CM | POA: Diagnosis not present

## 2019-03-23 DIAGNOSIS — I4892 Unspecified atrial flutter: Secondary | ICD-10-CM | POA: Diagnosis not present

## 2019-03-23 DIAGNOSIS — Z23 Encounter for immunization: Secondary | ICD-10-CM | POA: Diagnosis not present

## 2019-03-23 DIAGNOSIS — Z79899 Other long term (current) drug therapy: Secondary | ICD-10-CM | POA: Diagnosis not present

## 2019-03-23 DIAGNOSIS — I151 Hypertension secondary to other renal disorders: Secondary | ICD-10-CM | POA: Diagnosis not present

## 2019-03-23 DIAGNOSIS — Z940 Kidney transplant status: Secondary | ICD-10-CM | POA: Diagnosis not present

## 2019-03-23 DIAGNOSIS — M25569 Pain in unspecified knee: Secondary | ICD-10-CM | POA: Diagnosis not present

## 2019-03-23 DIAGNOSIS — I272 Pulmonary hypertension, unspecified: Secondary | ICD-10-CM | POA: Diagnosis not present

## 2019-03-23 DIAGNOSIS — E119 Type 2 diabetes mellitus without complications: Secondary | ICD-10-CM | POA: Diagnosis not present

## 2019-03-23 DIAGNOSIS — G8929 Other chronic pain: Secondary | ICD-10-CM | POA: Diagnosis not present

## 2019-03-23 DIAGNOSIS — E785 Hyperlipidemia, unspecified: Secondary | ICD-10-CM | POA: Diagnosis not present

## 2019-04-02 ENCOUNTER — Other Ambulatory Visit: Payer: Self-pay | Admitting: Family Medicine

## 2019-04-02 DIAGNOSIS — I509 Heart failure, unspecified: Secondary | ICD-10-CM

## 2019-05-14 ENCOUNTER — Ambulatory Visit: Payer: Medicare Other | Admitting: Family Medicine

## 2019-05-15 ENCOUNTER — Ambulatory Visit: Payer: Medicare Other | Admitting: Family Medicine

## 2019-05-15 ENCOUNTER — Other Ambulatory Visit: Payer: Self-pay

## 2019-05-15 DIAGNOSIS — E118 Type 2 diabetes mellitus with unspecified complications: Secondary | ICD-10-CM

## 2019-05-31 ENCOUNTER — Other Ambulatory Visit: Payer: Self-pay

## 2019-05-31 ENCOUNTER — Encounter (HOSPITAL_COMMUNITY): Payer: Self-pay

## 2019-05-31 ENCOUNTER — Ambulatory Visit (HOSPITAL_COMMUNITY)
Admission: EM | Admit: 2019-05-31 | Discharge: 2019-05-31 | Disposition: A | Discharge status: Home / Self Care | Payer: Medicare Other | Attending: Family Medicine | Admitting: Family Medicine

## 2019-05-31 ENCOUNTER — Other Ambulatory Visit: Payer: Self-pay | Admitting: Family Medicine

## 2019-05-31 DIAGNOSIS — Z20822 Contact with and (suspected) exposure to covid-19: Secondary | ICD-10-CM | POA: Insufficient documentation

## 2019-05-31 DIAGNOSIS — R6883 Chills (without fever): Secondary | ICD-10-CM | POA: Insufficient documentation

## 2019-05-31 DIAGNOSIS — R52 Pain, unspecified: Secondary | ICD-10-CM | POA: Diagnosis not present

## 2019-05-31 DIAGNOSIS — R519 Headache, unspecified: Secondary | ICD-10-CM | POA: Diagnosis not present

## 2019-05-31 DIAGNOSIS — R05 Cough: Secondary | ICD-10-CM

## 2019-05-31 DIAGNOSIS — I10 Essential (primary) hypertension: Secondary | ICD-10-CM | POA: Diagnosis not present
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
## 2019-05-31 NOTE — Discharge Instructions (Signed)
You have been tested for COVID-19 today. °If your test returns positive, you will receive a phone call from [facility] regarding your results. °Negative test results are not called. °Both positive and negative results area always visible on MyChart. °If you do not have a MyChart account, sign up instructions are provided in your discharge papers. °Please do not hesitate to contact us should you have questions or concerns. ° °

## 2019-05-31 NOTE — ED Triage Notes (Signed)
Pt c/o HA, body aches, chills, sore throat, chills, cough x2 days. Recently exposed to girlfriend who was suspected of having COVID, passed away from breathing complications on Saturday. Pt moderately SOB with ambulation to room, states is his baseline, improved/resolved with rest. No acute distress; able to speak full sentences w/o difficulty.

## 2019-06-02 LAB — NOVEL CORONAVIRUS, NAA (HOSP ORDER, SEND-OUT TO REF LAB; TAT 18-24 HRS): SARS-CoV-2, NAA: NOT DETECTED

## 2019-06-06 ENCOUNTER — Telehealth: Payer: Self-pay

## 2019-06-06 NOTE — Telephone Encounter (Signed)
Received a refill request for Lyrica. If approved please send into [facility] on [address]. Thanks!

## 2019-06-11 ENCOUNTER — Other Ambulatory Visit: Payer: Self-pay

## 2019-06-11 ENCOUNTER — Telehealth: Payer: Self-pay | Admitting: Nurse Practitioner

## 2019-06-11 MED ORDER — PREGABALIN 100 MG PO CAPS: 100.0000 mg | ORAL_CAPSULE | Freq: Three times a day (TID) | ORAL | 0 refills | Status: DC

## 2019-06-12 NOTE — Telephone Encounter (Signed)
[name],  This patient last saw you, can this be refilled? It is controlled and we are not able to send this via e-script. It looks like [facility] tried to refill yesterday but this did not go to pharmacy. Please advise.

## 2019-06-13 NOTE — Telephone Encounter (Signed)
[name],  That was a refill Rob[facility] had tried to refill and assigned the provider Dr. Doreene Burke. This RX printed and was shredded. That RX was never sent to pharmacy. Can you please refill if possible? Pt last saw you even though he is an original [facility] pt.

## 2019-06-13 NOTE — Telephone Encounter (Signed)
Spoke with Ronalee Belts from [facility]. He states he has been getting regularly and was last picked up on 05/09/19.

## 2019-06-14 ENCOUNTER — Other Ambulatory Visit: Payer: Self-pay | Admitting: Family Medicine

## 2019-06-14 MED ORDER — PREGABALIN 100 MG PO CAPS: 100.0000 mg | ORAL_CAPSULE | Freq: Three times a day (TID) | ORAL | 0 refills | Status: DC

## 2019-06-15 NOTE — Telephone Encounter (Signed)
Called and informed patient that 15 days supply was sent in and to keep appointment on 06/28/19 with [employer]. Patient verbalized understanding.

## 2019-06-15 NOTE — Telephone Encounter (Signed)
Called and spoke with patient, advised that 15 days has been sent into pharmacy and to keep follow up with [name] as scheduled for 06/28/19. Thanks !

## 2019-06-25 DIAGNOSIS — N529 Male erectile dysfunction, unspecified: Secondary | ICD-10-CM | POA: Diagnosis not present

## 2019-06-25 DIAGNOSIS — M25569 Pain in unspecified knee: Secondary | ICD-10-CM | POA: Diagnosis not present

## 2019-06-25 DIAGNOSIS — N2889 Other specified disorders of kidney and ureter: Secondary | ICD-10-CM | POA: Diagnosis not present

## 2019-06-25 DIAGNOSIS — Z79899 Other long term (current) drug therapy: Secondary | ICD-10-CM | POA: Diagnosis not present

## 2019-06-25 DIAGNOSIS — G8929 Other chronic pain: Secondary | ICD-10-CM | POA: Diagnosis not present

## 2019-06-25 DIAGNOSIS — E119 Type 2 diabetes mellitus without complications: Secondary | ICD-10-CM | POA: Diagnosis not present

## 2019-06-25 DIAGNOSIS — I151 Hypertension secondary to other renal disorders: Secondary | ICD-10-CM | POA: Diagnosis not present

## 2019-06-25 DIAGNOSIS — I4892 Unspecified atrial flutter: Secondary | ICD-10-CM | POA: Diagnosis not present

## 2019-06-25 DIAGNOSIS — I272 Pulmonary hypertension, unspecified: Secondary | ICD-10-CM | POA: Diagnosis not present

## 2019-06-25 DIAGNOSIS — E785 Hyperlipidemia, unspecified: Secondary | ICD-10-CM | POA: Diagnosis not present

## 2019-06-25 DIAGNOSIS — Z940 Kidney transplant status: Secondary | ICD-10-CM | POA: Diagnosis not present

## 2019-06-26 LAB — BLOOD GAS, ARTERIAL
Acid-base deficit: 0.6 mmol/L (ref 0.0–2.0)
Bicarbonate: 25.1 mmol/L (ref 20.0–28.0)
FIO2: 1
O2 Saturation: 92.1 %
Patient temperature: 98.2
pCO2 arterial: 52.8 mmHg — ABNORMAL HIGH (ref 32.0–48.0)
pH, Arterial: 7.297 — ABNORMAL LOW (ref 7.350–7.450)
pO2, Arterial: 65.6 mmHg — ABNORMAL LOW (ref 83.0–108.0)

## 2019-06-28 ENCOUNTER — Other Ambulatory Visit: Payer: Self-pay

## 2019-06-28 ENCOUNTER — Ambulatory Visit (INDEPENDENT_AMBULATORY_CARE_PROVIDER_SITE_OTHER): Payer: Medicare Other | Admitting: Nurse Practitioner

## 2019-06-28 DIAGNOSIS — Z940 Kidney transplant status: Secondary | ICD-10-CM | POA: Diagnosis not present

## 2019-06-28 DIAGNOSIS — G4730 Sleep apnea, unspecified: Secondary | ICD-10-CM

## 2019-06-28 DIAGNOSIS — E118 Type 2 diabetes mellitus with unspecified complications: Secondary | ICD-10-CM | POA: Diagnosis not present

## 2019-06-28 DIAGNOSIS — I10 Essential (primary) hypertension: Secondary | ICD-10-CM | POA: Diagnosis not present

## 2019-06-28 DIAGNOSIS — G6289 Other specified polyneuropathies: Secondary | ICD-10-CM

## 2019-06-28 DIAGNOSIS — I509 Heart failure, unspecified: Secondary | ICD-10-CM

## 2019-06-28 DIAGNOSIS — Z8679 Personal history of other diseases of the circulatory system: Secondary | ICD-10-CM

## 2019-06-28 MED ORDER — PREGABALIN 100 MG PO CAPS: 100.0000 mg | ORAL_CAPSULE | Freq: Three times a day (TID) | ORAL | 2 refills | Status: DC

## 2019-06-28 MED ORDER — VITAMIN D3 125 MCG (5000 UT) PO CAPS: 1.0000 | ORAL_CAPSULE | Freq: Every day | ORAL | 5 refills | Status: DC

## 2019-06-28 MED ORDER — IPRATROPIUM-ALBUTEROL 0.5-2.5 (3) MG/3ML IN SOLN: 3.0000 mL | Freq: Four times a day (QID) | RESPIRATORY_TRACT | 2 refills | Status: DC | PRN
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
## 2019-06-28 NOTE — Patient Instructions (Signed)
Complicated Grief Grief is a normal response to the death of someone close to you. Feelings of fear, anger, and guilt can affect almost everyone who loses a loved one. It is also common to have symptoms of depression while you are grieving. These include problems with sleep, loss of appetite, and lack of energy. They may last for weeks or months after a loss. Complicated grief is different from normal grief or depression. Normal grieving involves sadness and feelings of loss, but those feelings get better and heal over time. Complicated grief is a severe type of grief that lasts for a long time, usually for several months to a year or longer. It interferes with your ability to function normally. Complicated grief may require treatment from a mental health care provider. What are the causes? The cause of this condition is not known. It is not clear why some people continue to struggle with grief and others do not. What increases the risk? You are more likely to develop this condition if:  The death of your loved one was sudden or unexpected.  The death of your loved one was due to a violent event.  Your loved one died from suicide.  Your loved one was a child or a young person.  You were very close to your loved one, or you were dependent on him or her.  You have a history of depression or anxiety. What are the signs or symptoms? Symptoms of this condition include:  Feeling disbelief or having a lack of emotion (numbness).  Being unable to enjoy good memories of your loved one.  Needing to avoid anything or anyone that reminds you of your loved one.  Being unable to stop thinking about the death.  Feeling intense anger or guilt.  Feeling alone and hopeless.  Feeling that your life is meaningless and empty.  Losing the desire to move on with your life. How is this diagnosed? This condition may be diagnosed based on:  Your symptoms. Complicated grief will be diagnosed if you have  ongoing symptoms of grief for 6-12 months or longer.  The effect of symptoms on your life. You may be diagnosed with this condition if your symptoms are interfering with your ability to live your life. Your health care provider may recommend that you see a mental health care provider. Many symptoms of depression are similar to the symptoms of complicated grief. It is important to be evaluated for complicated grief along with other mental health conditions. How is this treated? This condition is most commonly treated with talk therapy. This therapy is offered by a mental health specialist (psychiatrist). During therapy:  You will learn healthy ways to cope with the loss of your loved one.  Your mental health care provider may recommend antidepressant medicines. Follow these instructions at home: Lifestyle   Take care of yourself. ? Eat on a regular basis, and maintain a healthy diet. Eat plenty of fruits, vegetables, lean protein, and whole grains. ? Try to get some exercise each day. Aim for 30 minutes of exercise on most days of the week. ? Keep a consistent sleep schedule. Try to get 8 or more hours of sleep each night. ? Start doing the things that you used to enjoy.  Do not use drugs or alcohol to ease your symptoms.  Spend time with friends and loved ones. General instructions  Take over-the-counter and prescription medicines only as told by your health care provider.  Consider joining a grief (bereavement) support group   to help you deal with your loss.  Keep all follow-up visits as told by your health care provider. This is important. Contact a health care provider if:  Your symptoms prevent you from functioning normally.  Your symptoms do not get better with treatment. Get help right away if:  You have serious thoughts about hurting yourself or someone else.  You have suicidal feelings. If you ever feel like you may hurt yourself or others, or have thoughts about taking  your own life, get help right away. You can go to your nearest emergency department or call:  Your local emergency services (911 in the [place]  A suicide crisis helpline, such as the National Suicide Prevention Lifeline at 1-800-273-8255. This is open 24 hours a day. Summary  Complicated grief is a severe type of grief that lasts for a long time. This grief is not likely to go away on its own. Get the help you need.  Some griefs are more difficult than others and can cause this condition. You may need a certain type of treatment to help you recover if the loss of your loved one was sudden, violent, or due to suicide.  You may feel guilty about moving on with your life. Getting help does not mean that you are forgetting your loved one. It means that you are taking care of yourself.  Complicated grief is best treated with talk therapy. Medicines may also be prescribed.  Seek the help you need, and find support that will help you recover. This information is not intended to replace advice given to you by your health care provider. Make sure you discuss any questions you have with your health care provider. Document Revised: 04/08/2017 Document Reviewed: 02/09/2017 Elsevier Patient Education  2020 [employer]  

## 2019-06-29 ENCOUNTER — Other Ambulatory Visit: Payer: Self-pay

## 2019-06-29 MED ORDER — IPRATROPIUM-ALBUTEROL 0.5-2.5 (3) MG/3ML IN SOLN: 3.0000 mL | Freq: Four times a day (QID) | RESPIRATORY_TRACT | 2 refills | Status: AC | PRN

## 2019-07-10 DIAGNOSIS — R809 Proteinuria, unspecified: Secondary | ICD-10-CM | POA: Diagnosis not present

## 2019-07-10 DIAGNOSIS — Z5181 Encounter for therapeutic drug level monitoring: Secondary | ICD-10-CM | POA: Diagnosis not present

## 2019-07-10 DIAGNOSIS — I10 Essential (primary) hypertension: Secondary | ICD-10-CM | POA: Diagnosis not present

## 2019-07-10 DIAGNOSIS — Z4822 Encounter for aftercare following kidney transplant: Secondary | ICD-10-CM | POA: Diagnosis not present

## 2019-07-10 DIAGNOSIS — Z7901 Long term (current) use of anticoagulants: Secondary | ICD-10-CM | POA: Diagnosis not present

## 2019-07-10 DIAGNOSIS — Z794 Long term (current) use of insulin: Secondary | ICD-10-CM | POA: Diagnosis not present

## 2019-07-10 DIAGNOSIS — D849 Immunodeficiency, unspecified: Secondary | ICD-10-CM | POA: Diagnosis not present

## 2019-07-10 DIAGNOSIS — I482 Chronic atrial fibrillation, unspecified: Secondary | ICD-10-CM | POA: Diagnosis not present

## 2019-07-10 DIAGNOSIS — E785 Hyperlipidemia, unspecified: Secondary | ICD-10-CM | POA: Diagnosis not present

## 2019-07-10 DIAGNOSIS — Z79899 Other long term (current) drug therapy: Secondary | ICD-10-CM | POA: Diagnosis not present

## 2019-07-10 DIAGNOSIS — R600 Localized edema: Secondary | ICD-10-CM | POA: Diagnosis not present

## 2019-07-10 DIAGNOSIS — I4891 Unspecified atrial fibrillation: Secondary | ICD-10-CM | POA: Diagnosis not present

## 2019-07-10 DIAGNOSIS — E119 Type 2 diabetes mellitus without complications: Secondary | ICD-10-CM | POA: Diagnosis not present

## 2019-07-10 DIAGNOSIS — Z940 Kidney transplant status: Secondary | ICD-10-CM | POA: Diagnosis not present

## 2019-07-30 ENCOUNTER — Other Ambulatory Visit: Payer: Self-pay | Admitting: Cardiology

## 2019-08-01 ENCOUNTER — Other Ambulatory Visit: Payer: Self-pay

## 2019-08-01 ENCOUNTER — Ambulatory Visit (INDEPENDENT_AMBULATORY_CARE_PROVIDER_SITE_OTHER): Payer: Medicare Other | Admitting: Nurse Practitioner

## 2019-08-01 ENCOUNTER — Encounter: Payer: Self-pay | Admitting: Nurse Practitioner

## 2019-08-01 VITALS — BP 159/85 | HR 81 | Temp 98.3°F | Resp 16 | Ht 70.0 in | Wt 208.0 lb

## 2019-08-01 DIAGNOSIS — R0609 Other forms of dyspnea: Secondary | ICD-10-CM

## 2019-08-01 DIAGNOSIS — R0600 Dyspnea, unspecified: Secondary | ICD-10-CM | POA: Diagnosis not present

## 2019-08-01 DIAGNOSIS — E118 Type 2 diabetes mellitus with unspecified complications: Secondary | ICD-10-CM | POA: Diagnosis not present

## 2019-08-01 DIAGNOSIS — Z0000 Encounter for general adult medical examination without abnormal findings: Secondary | ICD-10-CM | POA: Diagnosis not present

## 2019-08-01 DIAGNOSIS — I5022 Chronic systolic (congestive) heart failure: Secondary | ICD-10-CM

## 2019-08-01 DIAGNOSIS — I10 Essential (primary) hypertension: Secondary | ICD-10-CM | POA: Diagnosis not present

## 2019-08-01 LAB — POCT URINALYSIS DIPSTICK
Bilirubin, UA: NEGATIVE
Glucose, UA: POSITIVE — AB
Ketones, UA: NEGATIVE
Leukocytes, UA: NEGATIVE
Nitrite, UA: NEGATIVE
Protein, UA: POSITIVE — AB
Spec Grav, UA: 1.02
Urobilinogen, UA: 0.2 U/dL
pH, UA: 7

## 2019-08-01 LAB — POCT GLYCOSYLATED HEMOGLOBIN (HGB A1C): Hemoglobin A1C: 7.9 % — AB (ref 4.0–5.6)

## 2019-08-01 LAB — POCT CBG (FASTING - GLUCOSE)-MANUAL ENTRY: Glucose Fasting, POC: 217 mg/dL — AB (ref 70–99)

## 2019-08-01 MED ORDER — PREGABALIN 100 MG PO CAPS: 100.0000 mg | ORAL_CAPSULE | Freq: Three times a day (TID) | ORAL | 2 refills | Status: DC

## 2019-08-01 MED ORDER — ALBUTEROL SULFATE (2.5 MG/3ML) 0.083% IN NEBU: 2.5000 mg | INHALATION_SOLUTION | Freq: Four times a day (QID) | RESPIRATORY_TRACT | 1 refills | Status: AC | PRN

## 2019-08-01 MED ORDER — VITAMIN D3 125 MCG (5000 UT) PO CAPS: 1.0000 | ORAL_CAPSULE | Freq: Every day | ORAL | 5 refills | Status: DC

## 2019-08-01 NOTE — Patient Instructions (Signed)
Complicated Grief Grief is a normal response to the death of someone close to you. Feelings of fear, anger, and guilt can affect almost everyone who loses a loved one. It is also common to have symptoms of depression while you are grieving. These include problems with sleep, loss of appetite, and lack of energy. They may last for weeks or months after a loss. Complicated grief is different from normal grief or depression. Normal grieving involves sadness and feelings of loss, but those feelings get better and heal over time. Complicated grief is a severe type of grief that lasts for a long time, usually for several months to a year or longer. It interferes with your ability to function normally. Complicated grief may require treatment from a mental health care provider. What are the causes? The cause of this condition is not known. It is not clear why some people continue to struggle with grief and others do not. What increases the risk? You are more likely to develop this condition if:  The death of your loved one was sudden or unexpected.  The death of your loved one was due to a violent event.  Your loved one died from suicide.  Your loved one was a child or a young person.  You were very close to your loved one, or you were dependent on him or her.  You have a history of depression or anxiety. What are the signs or symptoms? Symptoms of this condition include:  Feeling disbelief or having a lack of emotion (numbness).  Being unable to enjoy good memories of your loved one.  Needing to avoid anything or anyone that reminds you of your loved one.  Being unable to stop thinking about the death.  Feeling intense anger or guilt.  Feeling alone and hopeless.  Feeling that your life is meaningless and empty.  Losing the desire to move on with your life. How is this diagnosed? This condition may be diagnosed based on:  Your symptoms. Complicated grief will be diagnosed if you have  ongoing symptoms of grief for 6-12 months or longer.  The effect of symptoms on your life. You may be diagnosed with this condition if your symptoms are interfering with your ability to live your life. Your health care provider may recommend that you see a mental health care provider. Many symptoms of depression are similar to the symptoms of complicated grief. It is important to be evaluated for complicated grief along with other mental health conditions. How is this treated? This condition is most commonly treated with talk therapy. This therapy is offered by a mental health specialist (psychiatrist). During therapy:  You will learn healthy ways to cope with the loss of your loved one.  Your mental health care provider may recommend antidepressant medicines. Follow these instructions at home: Lifestyle   Take care of yourself. ? Eat on a regular basis, and maintain a healthy diet. Eat plenty of fruits, vegetables, lean protein, and whole grains. ? Try to get some exercise each day. Aim for 30 minutes of exercise on most days of the week. ? Keep a consistent sleep schedule. Try to get 8 or more hours of sleep each night. ? Start doing the things that you used to enjoy.  Do not use drugs or alcohol to ease your symptoms.  Spend time with friends and loved ones. General instructions  Take over-the-counter and prescription medicines only as told by your health care provider.  Consider joining a grief (bereavement) support group   to help you deal with your loss.  Keep all follow-up visits as told by your health care provider. This is important. Contact a health care provider if:  Your symptoms prevent you from functioning normally.  Your symptoms do not get better with treatment. Get help right away if:  You have serious thoughts about hurting yourself or someone else.  You have suicidal feelings. If you ever feel like you may hurt yourself or others, or have thoughts about taking  your own life, get help right away. You can go to your nearest emergency department or call:  Your local emergency services (911 in the [place]  A suicide crisis helpline, such as the [facility] at (774)252-6689. This is open 24 hours a day. Summary  Complicated grief is a severe type of grief that lasts for a long time. This grief is not likely to go away on its own. Get the help you need.  Some griefs are more difficult than others and can cause this condition. You may need a certain type of treatment to help you recover if the loss of your loved one was sudden, violent, or due to suicide.  You may feel guilty about moving on with your life. Getting help does not mean that you are forgetting your loved one. It means that you are taking care of yourself.  Complicated grief is best treated with talk therapy. Medicines may also be prescribed.  Seek the help you need, and find support that will help you recover. This information is not intended to replace advice given to you by your health care provider. Make sure you discuss any questions you have with your health care provider. Document Revised: 04/08/2017 Document Reviewed: 02/09/2017 Elsevier Patient Education  [place]. Edema  Edema is when you have too much fluid in your body or under your skin. Edema may make your legs, feet, and ankles swell up. Swelling is also common in looser tissues, like around your eyes. This is a common condition. It gets more common as you get older. There are many possible causes of edema. Eating too much salt (sodium) and being on your feet or sitting for a long time can cause edema in your legs, feet, and ankles. Hot weather may make edema worse. Edema is usually painless. Your skin may look swollen or shiny. Follow these instructions at home:  Keep the swollen body part raised (elevated) above the level of your heart when you are sitting or lying down.  Do not sit still  or stand for a long time.  Do not wear tight clothes. Do not wear garters on your upper legs.  Exercise your legs. This can help the swelling go down.  Wear elastic bandages or support stockings as told by your doctor.  Eat a low-salt (low-sodium) diet to reduce fluid as told by your doctor.  Depending on the cause of your swelling, you may need to limit how much fluid you drink (fluid restriction).  Take over-the-counter and prescription medicines only as told by your doctor. Contact a doctor if:  Treatment is not working.  You have heart, liver, or kidney disease and have symptoms of edema.  You have sudden and unexplained weight gain. Get help right away if:  You have shortness of breath or chest pain.  You cannot breathe when you lie down.  You have pain, redness, or warmth in the swollen areas.  You have heart, liver, or kidney disease and get edema all of  a sudden.  You have a fever and your symptoms get worse all of a sudden. Summary  Edema is when you have too much fluid in your body or under your skin.  Edema may make your legs, feet, and ankles swell up. Swelling is also common in looser tissues, like around your eyes.  Raise (elevate) the swollen body part above the level of your heart when you are sitting or lying down.  Follow your doctor's instructions about diet and how much fluid you can drink (fluid restriction). This information is not intended to replace advice given to you by your health care provider. Make sure you discuss any questions you have with your health care provider. Document Revised: 04/29/2017 Document Reviewed: 05/14/2016 Elsevier Patient Education  2020 [employer].
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
## 2019-08-02 LAB — LIPID PANEL
Chol/HDL Ratio: 3.4 ratio (ref 0.0–5.0)
Cholesterol, Total: 138 mg/dL (ref 100–199)
HDL: 41 mg/dL (ref >39)
LDL Chol Calc (NIH): 72 mg/dL (ref 0–99)
Triglycerides: 146 mg/dL (ref 0–149)
VLDL Cholesterol Cal: 25 mg/dL (ref 5–40)

## 2019-08-02 LAB — COMP. METABOLIC PANEL (12)
AST: 20 IU/L (ref 0–40)
Albumin/Globulin Ratio: 1.4 (ref 1.2–2.2)
Albumin: 4.6 g/dL (ref 3.8–4.8)
Alkaline Phosphatase: 81 IU/L (ref 39–117)
BUN/Creatinine Ratio: 14 (ref 10–24)
BUN: 30 mg/dL — ABNORMAL HIGH (ref 8–27)
Bilirubin Total: 0.6 mg/dL (ref 0.0–1.2)
Calcium: 10.3 mg/dL — ABNORMAL HIGH (ref 8.6–10.2)
Chloride: 97 mmol/L (ref 96–106)
Creatinine, Ser: 2.09 mg/dL — ABNORMAL HIGH (ref 0.76–1.27)
GFR calc Af Amer: 38 mL/min/{1.73_m2} — ABNORMAL LOW (ref >59)
GFR calc non Af Amer: 33 mL/min/{1.73_m2} — ABNORMAL LOW (ref >59)
Globulin, Total: 3.2 g/dL (ref 1.5–4.5)
Glucose: 200 mg/dL — ABNORMAL HIGH (ref 65–99)
Potassium: 4.2 mmol/L (ref 3.5–5.2)
Sodium: 136 mmol/L (ref 134–144)
Total Protein: 7.8 g/dL (ref 6.0–8.5)

## 2019-08-02 LAB — CBC WITH DIFFERENTIAL/PLATELET
Basophils Absolute: 0 10*3/uL (ref 0.0–0.2)
Basos: 1 %
EOS (ABSOLUTE): 0.3 10*3/uL (ref 0.0–0.4)
Eos: 6 %
Hematocrit: 47.7 % (ref 37.5–51.0)
Hemoglobin: 15.5 g/dL (ref 13.0–17.7)
Immature Grans (Abs): 0 10*3/uL (ref 0.0–0.1)
Immature Granulocytes: 0 %
Lymphocytes Absolute: 0.8 10*3/uL (ref 0.7–3.1)
Lymphs: 17 %
MCH: 26.4 pg — ABNORMAL LOW (ref 26.6–33.0)
MCHC: 32.5 g/dL (ref 31.5–35.7)
MCV: 81 fL (ref 79–97)
Monocytes Absolute: 0.6 10*3/uL (ref 0.1–0.9)
Monocytes: 13 %
Neutrophils Absolute: 2.9 10*3/uL (ref 1.4–7.0)
Neutrophils: 63 %
Platelets: 240 10*3/uL (ref 150–450)
RBC: 5.88 x10E6/uL — ABNORMAL HIGH (ref 4.14–5.80)
RDW: 14.9 % (ref 11.6–15.4)
WBC: 4.6 10*3/uL (ref 3.4–10.8)

## 2019-08-02 LAB — MAGNESIUM: Magnesium: 1.8 mg/dL (ref 1.6–2.3)

## 2019-08-02 LAB — BRAIN NATRIURETIC PEPTIDE: BNP: 260 pg/mL — ABNORMAL HIGH (ref 0.0–100.0)

## 2019-08-02 LAB — SEDIMENTATION RATE: Sed Rate: 41 mm/hr — ABNORMAL HIGH (ref 0–30)

## 2019-08-03 ENCOUNTER — Other Ambulatory Visit: Payer: Self-pay | Admitting: Nurse Practitioner

## 2019-08-03 ENCOUNTER — Telehealth: Payer: Self-pay

## 2019-08-03 MED ORDER — SPIRONOLACTONE 25 MG PO TABS: 25.0000 mg | ORAL_TABLET | Freq: Every day | ORAL | 0 refills | Status: AC

## 2019-08-03 NOTE — Telephone Encounter (Signed)
-----   Message from Vevelyn Francois, NP sent at 08/03/2019 11:45 AM EDT ----- Please call [name] and make him aware that fluid in his legs is related to his heart failure.  We need to get him back in with cardiology.  Please find out which cardiologist he was seen and if you would like to return they are. Please verify his furosemide dose.  I want to add spironolactone for a few days and then have him follow-up with the cardiologist.

## 2019-08-03 NOTE — Telephone Encounter (Signed)
Called and spoke with Mr. Caudill made him aware that fluid in legs is due to heart failure and that we ask him to follow up with their office. Appointment was made for 08/15/2019 @8 :15am with Bunnie Domino at cardiology, pt is aware. His current dosage of lasix is 20mg  twice daily advised that we will add in spironolactone for a couple of days and he will follow up with cardiology regarding this issue. Patient verbalized understanding. Thasnk!
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
## 2019-08-14 ENCOUNTER — Ambulatory Visit: Payer: Medicare Other | Admitting: Family Medicine

## 2019-08-15 ENCOUNTER — Other Ambulatory Visit: Payer: Self-pay

## 2019-08-15 ENCOUNTER — Ambulatory Visit (INDEPENDENT_AMBULATORY_CARE_PROVIDER_SITE_OTHER): Payer: Medicare HMO | Admitting: Adult Health

## 2019-08-15 ENCOUNTER — Encounter: Payer: Self-pay | Admitting: Adult Health

## 2019-08-15 VITALS — BP 150/80 | HR 90 | Temp 97.0°F | Ht 70.0 in | Wt 214.0 lb

## 2019-08-15 DIAGNOSIS — I10 Essential (primary) hypertension: Secondary | ICD-10-CM | POA: Diagnosis not present

## 2019-08-15 DIAGNOSIS — N1831 Chronic kidney disease, stage 3a: Secondary | ICD-10-CM

## 2019-08-15 DIAGNOSIS — I872 Venous insufficiency (chronic) (peripheral): Secondary | ICD-10-CM

## 2019-08-15 DIAGNOSIS — K219 Gastro-esophageal reflux disease without esophagitis: Secondary | ICD-10-CM | POA: Diagnosis not present

## 2019-08-15 DIAGNOSIS — I5030 Unspecified diastolic (congestive) heart failure: Secondary | ICD-10-CM

## 2019-08-15 MED ORDER — NIFEDIPINE ER 90 MG PO TB24: 90.0000 mg | ORAL_TABLET | Freq: Every day | ORAL | 2 refills | Status: DC

## 2019-08-15 NOTE — Patient Instructions (Addendum)
Medication Instructions:   INCREASE NIFEDIPINE to 90 MG daily  Continue to take Omeprazole 40 mg daily  *If you need a refill on your cardiac medications before your next appointment, please call your pharmacy*  Lab Work: NONE ordered at this time of appointment   If you have labs (blood work) drawn today and your tests are completely normal, you will receive your results only by: Marland Kitchen MyChart Message (if you have MyChart) OR . A paper copy in the mail If you have any lab test that is abnormal or we need to change your treatment, we will call you to review the results.  Testing/Procedures: NONE ordered at this time of appointment   Follow-Up: At [facility], you and your health needs are our priority.  As part of our continuing mission to provide you with exceptional heart care, we have created designated Provider Care Teams.  These Care Teams include your primary Cardiologist (physician) and Advanced Practice Providers (APPs -  Physician Assistants and Nurse Practitioners) who all work together to provide you with the care you need, when you need it.  Your next appointment:  Monday May 10th @ 8:15 am  The format for your next appointment:   In Person  Provider:   Jory Sims, DNP, ANP  Other Instructions  Monitor your blood pressure daily at the same time

## 2019-08-20 ENCOUNTER — Other Ambulatory Visit: Payer: Self-pay | Admitting: Adult Health

## 2019-08-20 MED ORDER — NIFEDIPINE ER 90 MG PO TB24: 90.0000 mg | ORAL_TABLET | Freq: Every day | ORAL | 2 refills | Status: DC

## 2019-08-20 NOTE — Telephone Encounter (Signed)
*  STAT* If patient is at the pharmacy, call can be transferred to refill team.   1. Which medications need to be refilled? (please list name of each medication and dose if known) NIFEdipine (ADALAT CC) 90 MG 24 hr tablet  2. Which pharmacy/location (including street and city if local pharmacy) is medication to be sent to? [facility]  3. Do they need a 30 day or 90 day supply? [place] needs new prescription sent to them for the 90MG . States his meds are to go out today.

## 2019-08-20 NOTE — Telephone Encounter (Signed)
Rx has been sent to the pharmacy electronically. ° °

## 2019-08-20 NOTE — Telephone Encounter (Signed)
-----   Message from Jeronimo Norma sent at 08/16/2019  2:03 PM EDT ----- Prescription from Jory Sims gave to patient yesterday. Has questions please call  [phone].

## 2019-08-21 ENCOUNTER — Telehealth: Payer: Self-pay | Admitting: Cardiology

## 2019-08-21 MED ORDER — NIFEDIPINE ER 90 MG PO TB24: 90.0000 mg | ORAL_TABLET | Freq: Every day | ORAL | 2 refills | Status: AC

## 2019-08-21 NOTE — Telephone Encounter (Signed)
[place] was not listed on file. Rx sent in electronically and pharmacy notified

## 2019-08-21 NOTE — Telephone Encounter (Signed)
New message   [name] states that a new prescription was not sent to the [facility] for NIFEdipine (ADALAT CC) 90 MG 24 hr tablet. Please call to discuss.

## 2019-08-29 ENCOUNTER — Other Ambulatory Visit: Payer: Self-pay

## 2019-08-29 MED ORDER — CARVEDILOL 25 MG PO TABS: ORAL_TABLET | ORAL | 3 refills | Status: DC
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
## 2019-09-17 ENCOUNTER — Other Ambulatory Visit: Payer: Self-pay

## 2019-09-17 ENCOUNTER — Encounter: Payer: Self-pay | Admitting: Adult Health

## 2019-09-17 ENCOUNTER — Ambulatory Visit (INDEPENDENT_AMBULATORY_CARE_PROVIDER_SITE_OTHER): Payer: Medicare Other | Admitting: Adult Health

## 2019-09-17 VITALS — BP 140/73 | HR 71 | Temp 97.5°F | Ht 70.0 in | Wt 210.0 lb

## 2019-09-17 DIAGNOSIS — K219 Gastro-esophageal reflux disease without esophagitis: Secondary | ICD-10-CM

## 2019-09-17 DIAGNOSIS — I10 Essential (primary) hypertension: Secondary | ICD-10-CM | POA: Diagnosis not present

## 2019-09-17 DIAGNOSIS — N183 Chronic kidney disease, stage 3 unspecified: Secondary | ICD-10-CM

## 2019-09-17 DIAGNOSIS — I4811 Longstanding persistent atrial fibrillation: Secondary | ICD-10-CM | POA: Diagnosis not present

## 2019-09-17 NOTE — Patient Instructions (Signed)
Medication Instructions:  Continue current medications  *If you need a refill on your cardiac medications before your next appointment, please call your pharmacy*   Lab Work: None Ordered  Testing/Procedures: None Ordered   Follow-Up: At [facility], you and your health needs are our priority.  As part of our continuing mission to provide you with exceptional heart care, we have created designated Provider Care Teams.  These Care Teams include your primary Cardiologist (physician) and Advanced Practice Providers (APPs -  Physician Assistants and Nurse Practitioners) who all work together to provide you with the care you need, when you need it.  We recommend signing up for the patient portal called "MyChart".  Sign up information is provided on this After Visit Summary.  MyChart is used to connect with patients for Virtual Visits (Telemedicine).  Patients are able to view lab/test results, encounter notes, upcoming appointments, etc.  Non-urgent messages can be sent to your provider as well.   To learn more about what you can do with MyChart, go to [email or web address].    Your next appointment:   6 month(s)  The format for your next appointment:   In Person  Provider:   You may see Buford Dresser, MD or one of the following Advanced Practice Providers on your designated Care Team:    Rosaria Ferries, PA-C  Jory Sims, DNP, ANP  Cadence Kathlen Mody, NP

## 2019-09-26 DIAGNOSIS — Z940 Kidney transplant status: Secondary | ICD-10-CM | POA: Diagnosis not present

## 2019-09-26 DIAGNOSIS — E119 Type 2 diabetes mellitus without complications: Secondary | ICD-10-CM | POA: Diagnosis not present

## 2019-09-28 ENCOUNTER — Other Ambulatory Visit: Payer: Self-pay | Admitting: Family Medicine

## 2019-09-28 DIAGNOSIS — I509 Heart failure, unspecified: Secondary | ICD-10-CM

## 2019-10-03 DIAGNOSIS — N2889 Other specified disorders of kidney and ureter: Secondary | ICD-10-CM | POA: Diagnosis not present

## 2019-10-03 DIAGNOSIS — E785 Hyperlipidemia, unspecified: Secondary | ICD-10-CM | POA: Diagnosis not present

## 2019-10-03 DIAGNOSIS — Z940 Kidney transplant status: Secondary | ICD-10-CM | POA: Diagnosis not present

## 2019-10-03 DIAGNOSIS — E119 Type 2 diabetes mellitus without complications: Secondary | ICD-10-CM | POA: Diagnosis not present

## 2019-10-03 DIAGNOSIS — I4892 Unspecified atrial flutter: Secondary | ICD-10-CM | POA: Diagnosis not present

## 2019-10-03 DIAGNOSIS — G8929 Other chronic pain: Secondary | ICD-10-CM | POA: Diagnosis not present

## 2019-10-03 DIAGNOSIS — M25569 Pain in unspecified knee: Secondary | ICD-10-CM | POA: Diagnosis not present

## 2019-10-03 DIAGNOSIS — Z79899 Other long term (current) drug therapy: Secondary | ICD-10-CM | POA: Diagnosis not present

## 2019-10-03 DIAGNOSIS — N529 Male erectile dysfunction, unspecified: Secondary | ICD-10-CM | POA: Diagnosis not present

## 2019-10-03 DIAGNOSIS — I151 Hypertension secondary to other renal disorders: Secondary | ICD-10-CM | POA: Diagnosis not present

## 2019-10-03 DIAGNOSIS — I272 Pulmonary hypertension, unspecified: Secondary | ICD-10-CM | POA: Diagnosis not present

## 2019-10-17 DIAGNOSIS — Z940 Kidney transplant status: Secondary | ICD-10-CM | POA: Diagnosis not present

## 2019-11-01 ENCOUNTER — Encounter: Payer: Self-pay | Admitting: Nurse Practitioner

## 2019-11-01 ENCOUNTER — Other Ambulatory Visit: Payer: Self-pay

## 2019-11-01 ENCOUNTER — Ambulatory Visit (INDEPENDENT_AMBULATORY_CARE_PROVIDER_SITE_OTHER): Payer: Medicare Other | Admitting: Nurse Practitioner

## 2019-11-01 VITALS — BP 153/86 | HR 65 | Temp 97.8°F | Ht 70.0 in | Wt 206.6 lb

## 2019-11-01 DIAGNOSIS — I509 Heart failure, unspecified: Secondary | ICD-10-CM

## 2019-11-01 DIAGNOSIS — G4730 Sleep apnea, unspecified: Secondary | ICD-10-CM | POA: Diagnosis not present

## 2019-11-01 DIAGNOSIS — I890 Lymphedema, not elsewhere classified: Secondary | ICD-10-CM | POA: Diagnosis not present

## 2019-11-01 DIAGNOSIS — I5022 Chronic systolic (congestive) heart failure: Secondary | ICD-10-CM | POA: Diagnosis not present

## 2019-11-01 DIAGNOSIS — E118 Type 2 diabetes mellitus with unspecified complications: Secondary | ICD-10-CM

## 2019-11-01 DIAGNOSIS — Z940 Kidney transplant status: Secondary | ICD-10-CM

## 2019-11-01 DIAGNOSIS — E785 Hyperlipidemia, unspecified: Secondary | ICD-10-CM

## 2019-11-01 LAB — POCT URINALYSIS DIPSTICK
Bilirubin, UA: NEGATIVE
Glucose, UA: NEGATIVE
Ketones, UA: NEGATIVE
Leukocytes, UA: NEGATIVE
Nitrite, UA: NEGATIVE
Protein, UA: POSITIVE — AB
Spec Grav, UA: 1.02 (ref 1.010–1.025)
Urobilinogen, UA: 0.2 E.U./dL
pH, UA: 5.5 (ref 5.0–8.0)

## 2019-11-01 LAB — POCT GLYCOSYLATED HEMOGLOBIN (HGB A1C)
HbA1c POC (<> result, manual entry): 6.6 % (ref 4.0–5.6)
HbA1c, POC (controlled diabetic range): 6.6 % (ref 0.0–7.0)
HbA1c, POC (prediabetic range): 6.6 % — AB (ref 5.7–6.4)
Hemoglobin A1C: 6.6 % — AB (ref 4.0–5.6)

## 2019-11-01 MED ORDER — VITAMIN D 50 MCG (2000 UT) PO CAPS: 1.0000 | ORAL_CAPSULE | Freq: Every day | ORAL | 3 refills | Status: AC

## 2019-11-01 MED ORDER — SENNOSIDES-DOCUSATE SODIUM 8.6-50 MG PO TABS: 1.0000 | ORAL_TABLET | Freq: Every day | ORAL | 3 refills | Status: AC

## 2019-11-01 NOTE — Patient Instructions (Signed)
Heart Failure and Exercise Heart failure is a condition in which the heart does not fill or pump enough blood and oxygen to support your body and its functions. Heart failure is a long-term (chronic) condition. Living with heart failure can be challenging. However, following your health care provider's instructions about a healthy lifestyle may help improve your symptoms. This includes choosing the right exercise plan. Doing daily physical activity is important after a diagnosis of heart failure. You may have some activity restrictions, so talk to your health care provider before doing any exercises. What are the benefits of exercise? Exercise may:  Make your heart muscles stronger.  Lower your blood pressure.  Lower your cholesterol.  Help you lose weight.  Help your bones stay strong.  Improve your blood circulation.  Help your body use oxygen better. This relieves symptoms such as fatigue and shortness of breath.  Help your mental health by lowering the risk of depression and other problems.  Improve your quality of life.  Decrease your chance of hospital admission for heart failure. What is an exercise plan? An exercise plan is a set of specific exercises and training activities. You will work with your health care provider to create the exercise plan that works for you. The plan may include:  Different types of exercises and how to do them.  Cardiac rehabilitation exercises. These are supervised programs that are designed to strengthen your heart. What are strengthening exercises? Strengthening exercises are a type of physical activity that involves using resistance to improve your muscle strength. Strengthening exercises usually have repetitive motions. These types of exercises can include:  Lifting weights.  Using weight machines.  Using resistance tubes and bands.  Using kettlebells.  Using your body weight, such as doing push-ups or squats. What are balance  exercises? Balance exercises are another type of physical activity. They strengthen the muscles of the back, stomach, and pelvis (core muscles) and improve your balance. They can also lower your risk of falling. These types of exercises can include:  Standing on one leg.  Walking backward, sideways, and in a straight line.  Standing up after sitting, without using your hands.  Shifting your weight from one leg to the other.  Lifting one leg in front of you.  Doing tai chi. This is a type of exercise that uses slow movements and deep breathing. How can I increase my flexibility? Having better flexibility can keep you from falling. It can also lengthen your muscles, improve your range of motion, and help your joints. You can increase your flexibility by:  Doing tai chi.  Doing yoga.  Stretching. How much aerobic exercise should I get?  Aerobic exercises strengthen your breathing and circulation system and increase your body's use of oxygen. Examples of aerobic exercise include biking, walking, running, and swimming. Talk to your health care provider to find out how much aerobic exercise is safe for you.  To do these exercises:  Start exercising slowly, limiting the amount of time at first. You may need to start with 5 minutes of aerobic exercise every day.  Slowly add more minutes until you can safely do at least 30 minutes of exercise at least 4 days a week. Summary  Daily physical activity is important after a diagnosis of heart failure.  Exercise can make your heart muscles stronger. It also offers other benefits that will improve your health.  Talk to your health care provider before doing any exercises. This information is not intended to replace advice  given to you by your health care provider. Make sure you discuss any questions you have with your health care provider. Document Revised: 09/10/2016 Document Reviewed: 09/07/2016 Elsevier Patient Education  [place].
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
## 2019-11-02 LAB — COMP. METABOLIC PANEL (12)
AST: 21 IU/L (ref 0–40)
Albumin/Globulin Ratio: 1.5 (ref 1.2–2.2)
Albumin: 4.6 g/dL (ref 3.8–4.8)
Alkaline Phosphatase: 87 IU/L (ref 48–121)
BUN/Creatinine Ratio: 17 (ref 10–24)
BUN: 33 mg/dL — ABNORMAL HIGH (ref 8–27)
Bilirubin Total: 0.5 mg/dL (ref 0.0–1.2)
Calcium: 10.1 mg/dL (ref 8.6–10.2)
Chloride: 103 mmol/L (ref 96–106)
Creatinine, Ser: 1.92 mg/dL — ABNORMAL HIGH (ref 0.76–1.27)
GFR calc Af Amer: 42 mL/min/{1.73_m2} — ABNORMAL LOW (ref >59)
GFR calc non Af Amer: 36 mL/min/{1.73_m2} — ABNORMAL LOW (ref >59)
Globulin, Total: 3.1 g/dL (ref 1.5–4.5)
Glucose: 70 mg/dL (ref 65–99)
Potassium: 4.3 mmol/L (ref 3.5–5.2)
Sodium: 140 mmol/L (ref 134–144)
Total Protein: 7.7 g/dL (ref 6.0–8.5)

## 2019-11-02 LAB — BRAIN NATRIURETIC PEPTIDE: BNP: 366.3 pg/mL — ABNORMAL HIGH (ref 0.0–100.0)

## 2019-11-05 NOTE — Progress Notes (Signed)
BNP is elevated but not severely. With normal EF on echo, it is generally hypertension and volume management. BP was better at last cards visit, and with his renal history I would be cautious to diurese based on BNP alone. I would continue to monitor, do daily weights, and if he needs PRN additional lasix for symptoms, that would be reasonable.

## 2019-11-27 ENCOUNTER — Other Ambulatory Visit: Payer: Self-pay | Admitting: Nurse Practitioner

## 2019-12-24 DIAGNOSIS — E119 Type 2 diabetes mellitus without complications: Secondary | ICD-10-CM | POA: Diagnosis not present

## 2019-12-24 DIAGNOSIS — Z940 Kidney transplant status: Secondary | ICD-10-CM | POA: Diagnosis not present

## 2020-01-01 DIAGNOSIS — Z940 Kidney transplant status: Secondary | ICD-10-CM | POA: Diagnosis not present

## 2020-01-01 DIAGNOSIS — I151 Hypertension secondary to other renal disorders: Secondary | ICD-10-CM | POA: Diagnosis not present

## 2020-01-01 DIAGNOSIS — G8929 Other chronic pain: Secondary | ICD-10-CM | POA: Diagnosis not present

## 2020-01-01 DIAGNOSIS — N2889 Other specified disorders of kidney and ureter: Secondary | ICD-10-CM | POA: Diagnosis not present

## 2020-01-01 DIAGNOSIS — Z0000 Encounter for general adult medical examination without abnormal findings: Secondary | ICD-10-CM | POA: Diagnosis not present

## 2020-01-01 DIAGNOSIS — E785 Hyperlipidemia, unspecified: Secondary | ICD-10-CM | POA: Diagnosis not present

## 2020-01-01 DIAGNOSIS — I4892 Unspecified atrial flutter: Secondary | ICD-10-CM | POA: Diagnosis not present

## 2020-01-01 DIAGNOSIS — I272 Pulmonary hypertension, unspecified: Secondary | ICD-10-CM | POA: Diagnosis not present

## 2020-01-01 DIAGNOSIS — Z79899 Other long term (current) drug therapy: Secondary | ICD-10-CM | POA: Diagnosis not present

## 2020-01-01 DIAGNOSIS — E119 Type 2 diabetes mellitus without complications: Secondary | ICD-10-CM | POA: Diagnosis not present

## 2020-01-01 DIAGNOSIS — E669 Obesity, unspecified: Secondary | ICD-10-CM | POA: Diagnosis not present

## 2020-01-26 ENCOUNTER — Other Ambulatory Visit: Payer: Self-pay | Admitting: Cardiology

## 2020-01-28 NOTE — Telephone Encounter (Signed)
Refill Request.  

## 2020-01-30 ENCOUNTER — Other Ambulatory Visit: Payer: Self-pay

## 2020-01-30 ENCOUNTER — Encounter: Payer: Self-pay | Admitting: Nurse Practitioner

## 2020-01-30 ENCOUNTER — Ambulatory Visit (INDEPENDENT_AMBULATORY_CARE_PROVIDER_SITE_OTHER): Payer: Medicare Other | Admitting: Nurse Practitioner

## 2020-01-30 VITALS — BP 128/81 | HR 70 | Temp 97.2°F | Ht 70.0 in | Wt 208.0 lb

## 2020-01-30 DIAGNOSIS — E118 Type 2 diabetes mellitus with unspecified complications: Secondary | ICD-10-CM

## 2020-01-30 DIAGNOSIS — Z8679 Personal history of other diseases of the circulatory system: Secondary | ICD-10-CM

## 2020-01-30 DIAGNOSIS — E119 Type 2 diabetes mellitus without complications: Secondary | ICD-10-CM | POA: Diagnosis not present

## 2020-01-30 DIAGNOSIS — Z940 Kidney transplant status: Secondary | ICD-10-CM

## 2020-01-30 DIAGNOSIS — I10 Essential (primary) hypertension: Secondary | ICD-10-CM

## 2020-01-30 DIAGNOSIS — I5022 Chronic systolic (congestive) heart failure: Secondary | ICD-10-CM

## 2020-01-30 LAB — POCT URINALYSIS DIPSTICK (MANUAL)
Leukocytes, UA: NEGATIVE
Nitrite, UA: NEGATIVE
Poct Glucose: 50 mg/dL — AB
Poct Ketones: NEGATIVE
Poct Urobilinogen: 1 mg/dL — AB
Spec Grav, UA: >=1.03 — AB (ref 1.010–1.025)
pH, UA: 5.5 (ref 5.0–8.0)

## 2020-01-30 LAB — POCT GLYCOSYLATED HEMOGLOBIN (HGB A1C): Hemoglobin A1C: 7 % — AB (ref 4.0–5.6)

## 2020-01-30 MED ORDER — PREGABALIN 100 MG PO CAPS: 100.0000 mg | ORAL_CAPSULE | Freq: Three times a day (TID) | ORAL | 5 refills | Status: AC
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
## 2020-01-31 LAB — COMP. METABOLIC PANEL (12)
AST: 16 IU/L (ref 0–40)
Albumin/Globulin Ratio: 1.4 (ref 1.2–2.2)
Albumin: 4.3 g/dL (ref 3.8–4.8)
Alkaline Phosphatase: 93 IU/L (ref 44–121)
BUN/Creatinine Ratio: 12 (ref 10–24)
BUN: 26 mg/dL (ref 8–27)
Bilirubin Total: 0.4 mg/dL (ref 0.0–1.2)
Calcium: 9.4 mg/dL (ref 8.6–10.2)
Chloride: 102 mmol/L (ref 96–106)
Creatinine, Ser: 2.1 mg/dL — ABNORMAL HIGH (ref 0.76–1.27)
GFR calc Af Amer: 37 mL/min/{1.73_m2} — ABNORMAL LOW (ref >59)
GFR calc non Af Amer: 32 mL/min/{1.73_m2} — ABNORMAL LOW (ref >59)
Globulin, Total: 3 g/dL (ref 1.5–4.5)
Glucose: 177 mg/dL — ABNORMAL HIGH (ref 65–99)
Potassium: 4.4 mmol/L (ref 3.5–5.2)
Sodium: 139 mmol/L (ref 134–144)
Total Protein: 7.3 g/dL (ref 6.0–8.5)

## 2020-02-01 ENCOUNTER — Ambulatory Visit: Payer: Medicare Other | Admitting: Nurse Practitioner

## 2020-02-04 IMAGING — CT CT HEAD W/O CM
5 of 8 series · 17 of 47 positions shown, 18 images · non-contrast
Comparison: 04/06/2014

CLINICAL DATA: Altered mental status, pinpoint pupils, combative

EXAM:
CT HEAD WITHOUT CONTRAST
TECHNIQUE: Contiguous axial images were obtained from the base of the skull
through the vertex without intravenous contrast.

[Series 3: head bone · axial · 0.42mm/px · z∈[-143,-31]mm · 7 of 80 slices shown]
[im 8/80  bone]
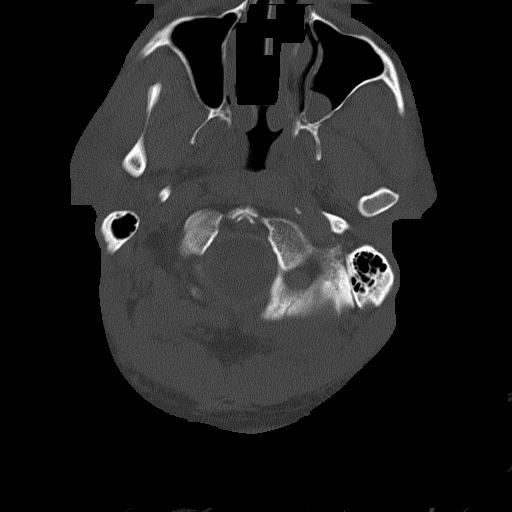
[im 16/80  bone]
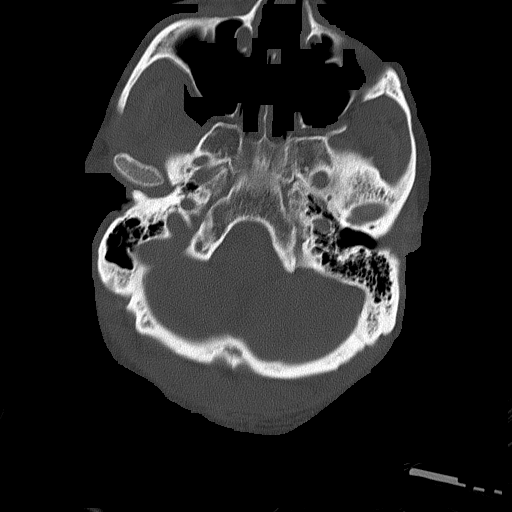
[im 24/80  bone]
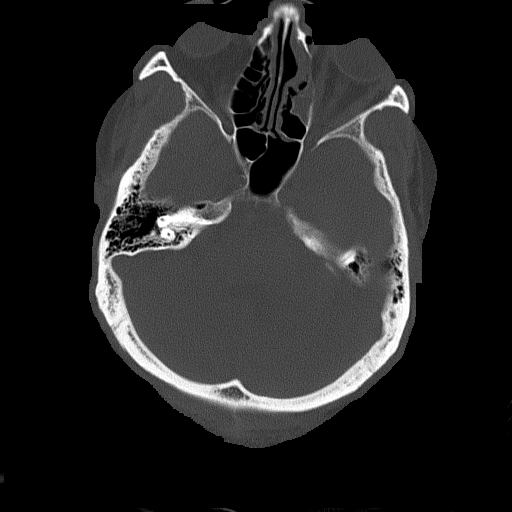
[im 32/80  bone]
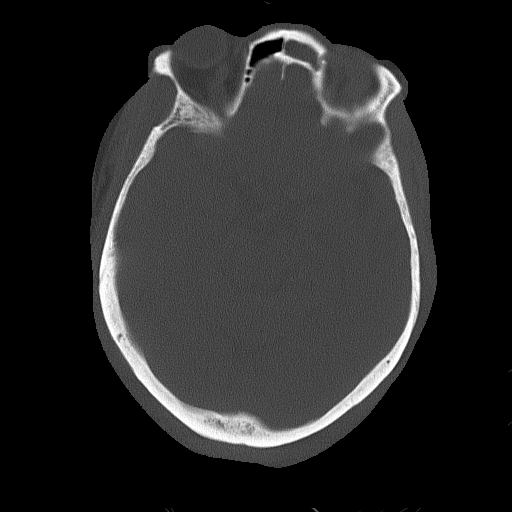
[im 48/80  bone]
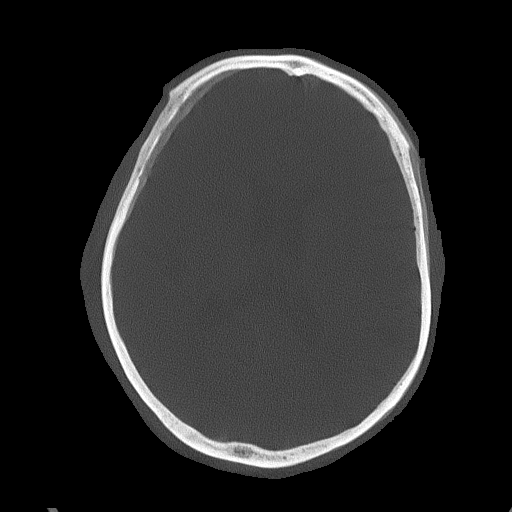
[im 56/80  bone]
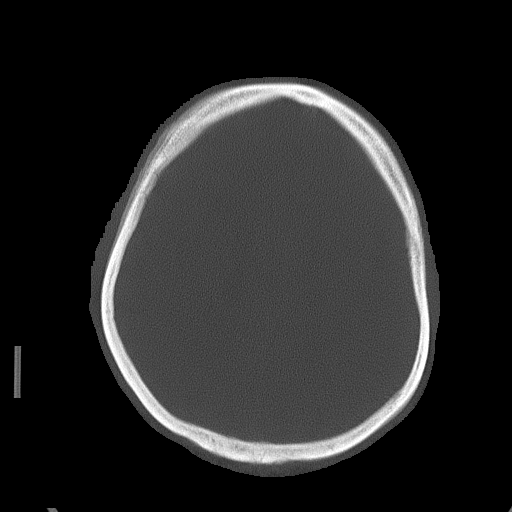
[im 64/80  bone]
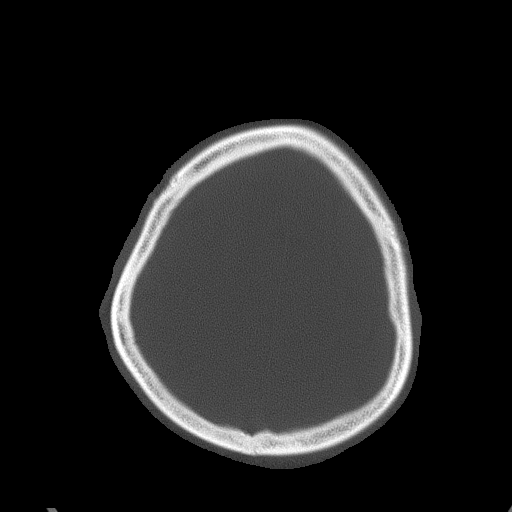

[Series 4: head without · axial · non-contrast · 0.42mm/px · z∈[-107,-57]mm · 2 of 32 slices shown, 3 images (1 of 2)]
[im 11/32  brain]
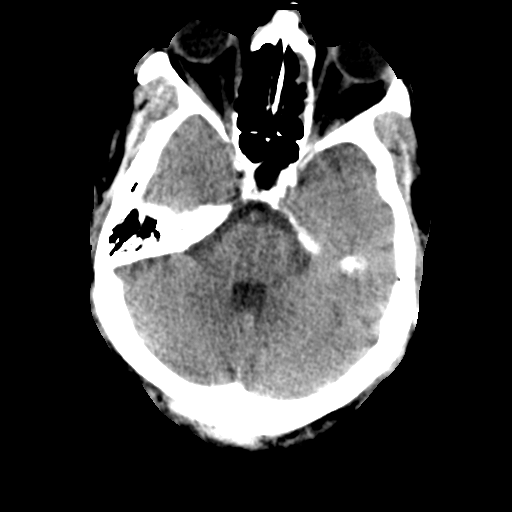
[im 11/32  bone]
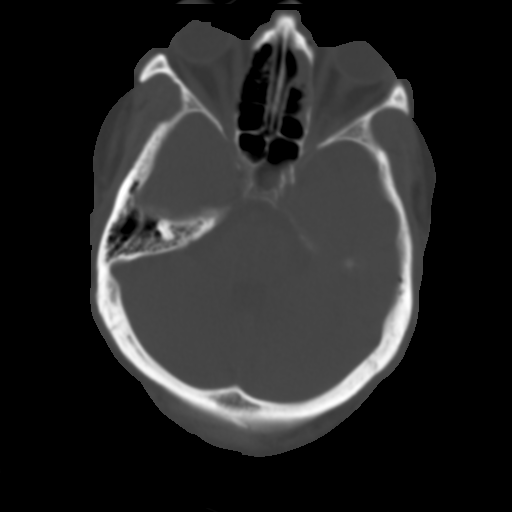
[im 21/32  brain]
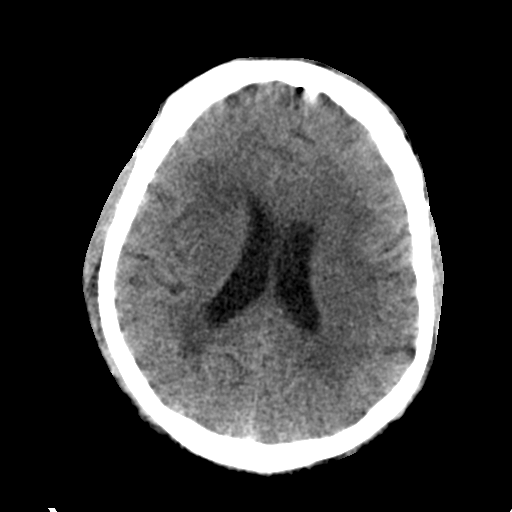

[Series 7: head without · axial · non-contrast · 0.44mm/px · z∈[-104,-54]mm · 2 of 32 slices shown (2 of 2)]
[im 11/32  brain]
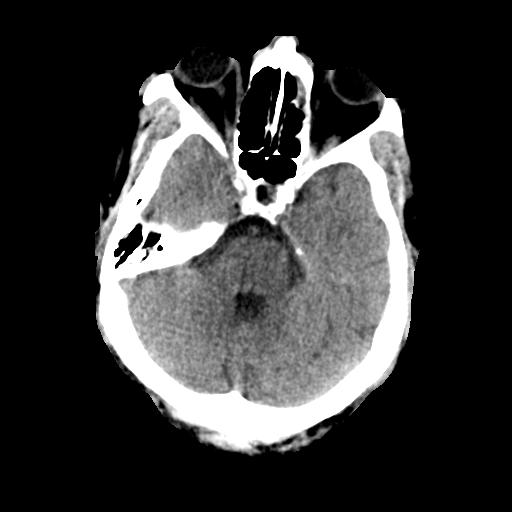
[im 21/32  brain]
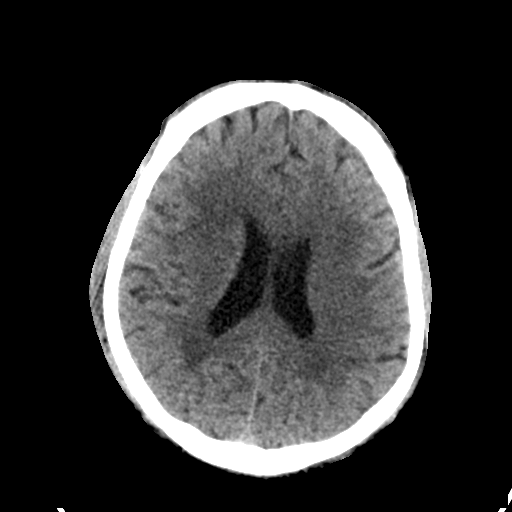

[Series 9: head without cor · coronal · non-contrast · 0.33mm/px · 3 of 63 slices shown]
[im 16/63  brain]
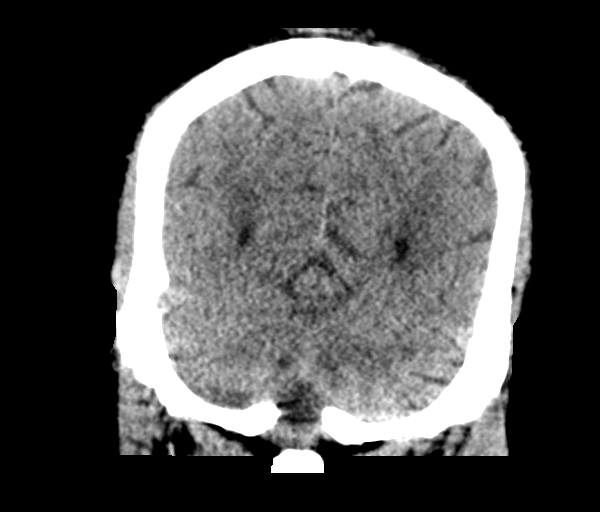
[im 32/63  brain]
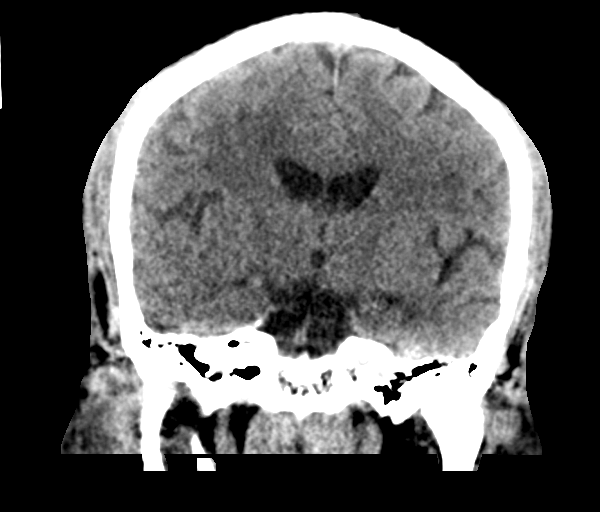
[im 47/63  brain]
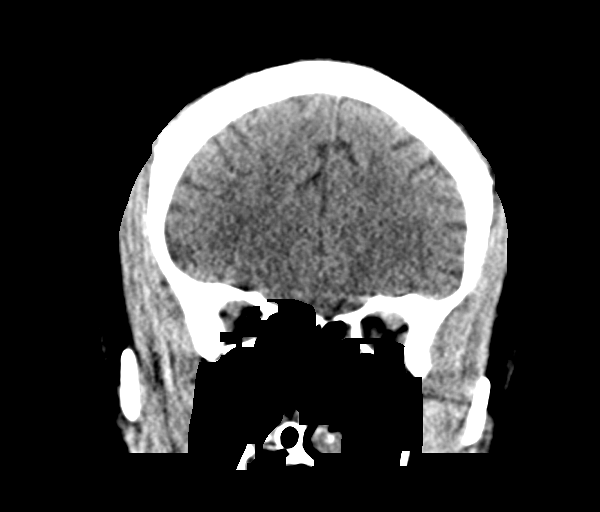

[Series 10: head without sag · sagittal · non-contrast · 0.34mm/px · 3 of 52 slices shown]
[im 15/52  brain]
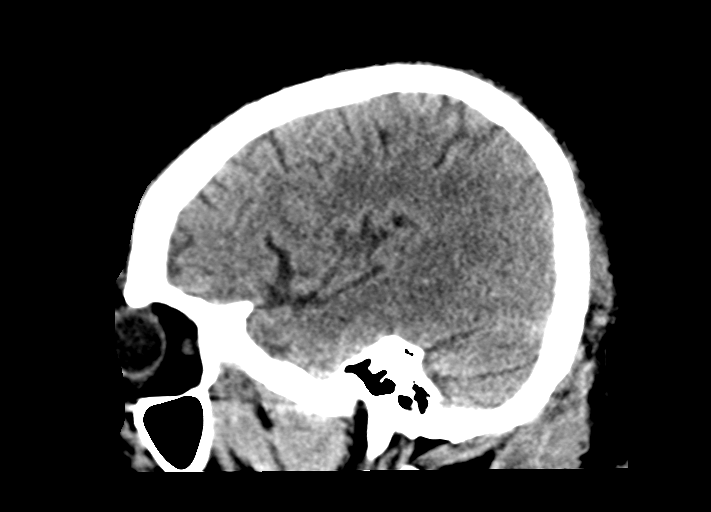
[im 29/52  brain]
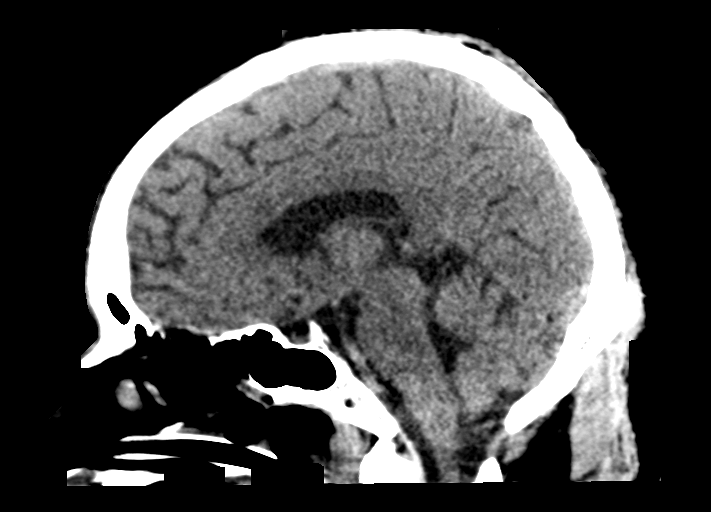
[im 43/52  brain]
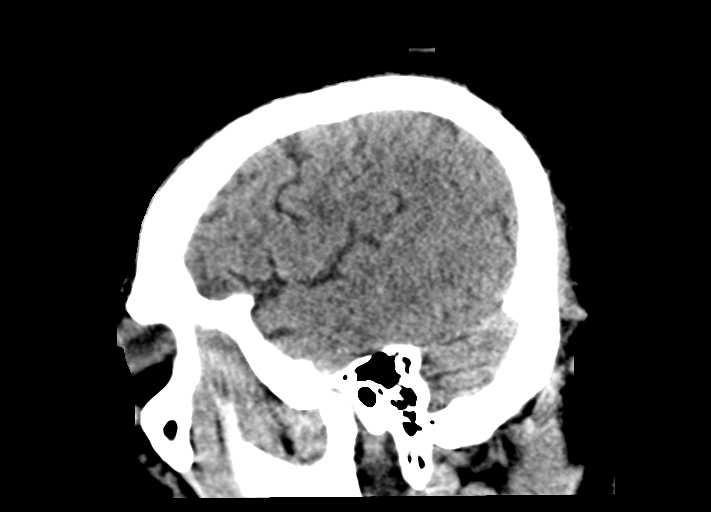

[17 of 47 positions shown; findings below may reference images not displayed]

FINDINGS: Brain: Patchy areas of hypoattenuation in deep white matter right
greater than left, stable. Negative for acute intracranial
hemorrhage, mass lesion, acute infarction, midline shift, or
mass-effect. Acute infarct may be inapparent on noncontrast CT.
Ventricles and sulci symmetric.

Vascular: Atherosclerotic and physiologic intracranial
calcifications.

Skull: Normal. Negative for fracture or focal lesion.

Sinuses/Orbits: Stable retention cyst or polyp in the left maxillary
sinus. Chronic opacification left frontal sinus and left ethmoid air
cells.

Other: None
IMPRESSION: 1. Chronic nonspecific areas of white matter hypoattenuation. No
acute findings.
2. Chronic paranasal sinus disease as above.

## 2020-02-05 IMAGING — DX DG CHEST 1V PORT
1 series · 1 of 1 positions shown · non-contrast
Comparison: Portable chest x-ray of 01/02/2017

CLINICAL DATA: Pulmonary edema by history, follow-up

EXAM:
PORTABLE CHEST 1 VIEW

[chest ap]
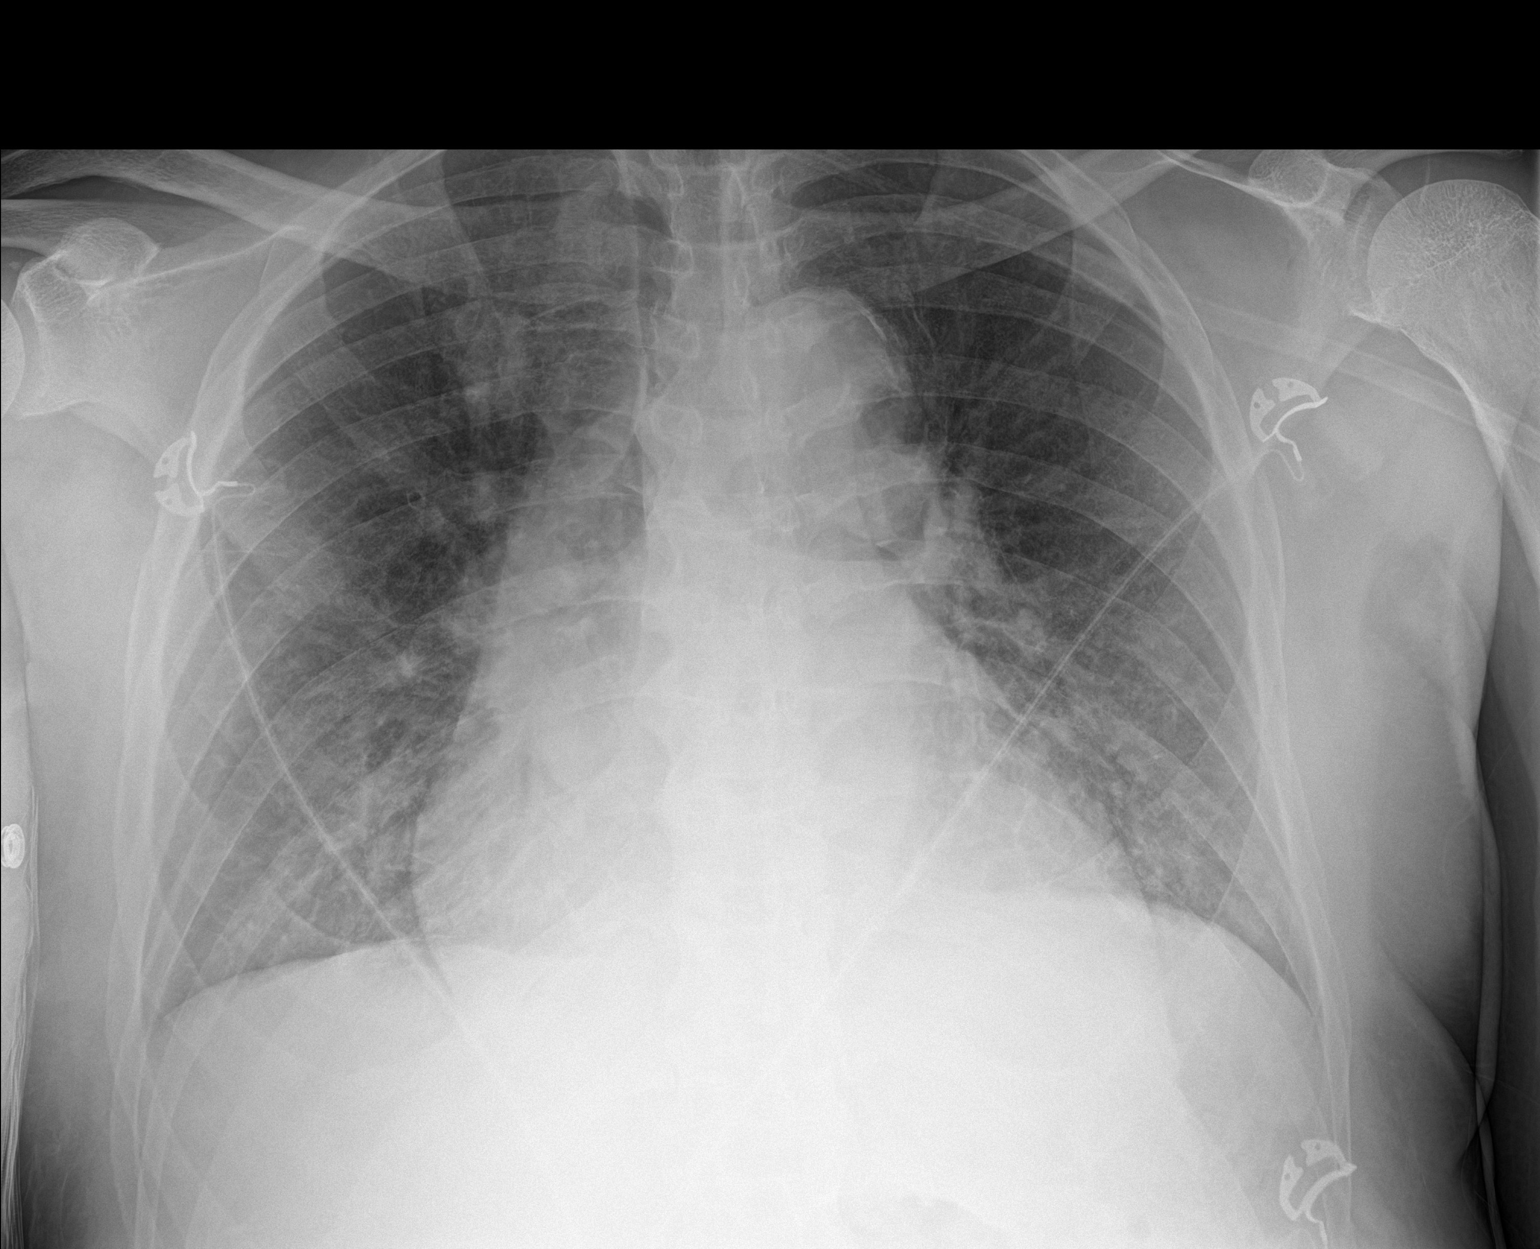

[1 of 1 positions shown; findings below may reference images not displayed]

FINDINGS: There is little change in moderate cardiomegaly and very mild
pulmonary vascular congestion, possibly improved somewhat. No
pleural effusion is seen. Mediastinal and hilar contours are
unremarkable. No bony abnormality is seen.
IMPRESSION: Little change to slight improvement in mild pulmonary vascular
congestion. Stable cardiomegaly.

## 2020-02-06 IMAGING — DX DG CHEST 2V
2 series · 2 of 2 positions shown · non-contrast
Comparison: 01/03/2018

CLINICAL DATA: Pulmonary edema

EXAM:
CHEST - 2 VIEW

[x chest ap]
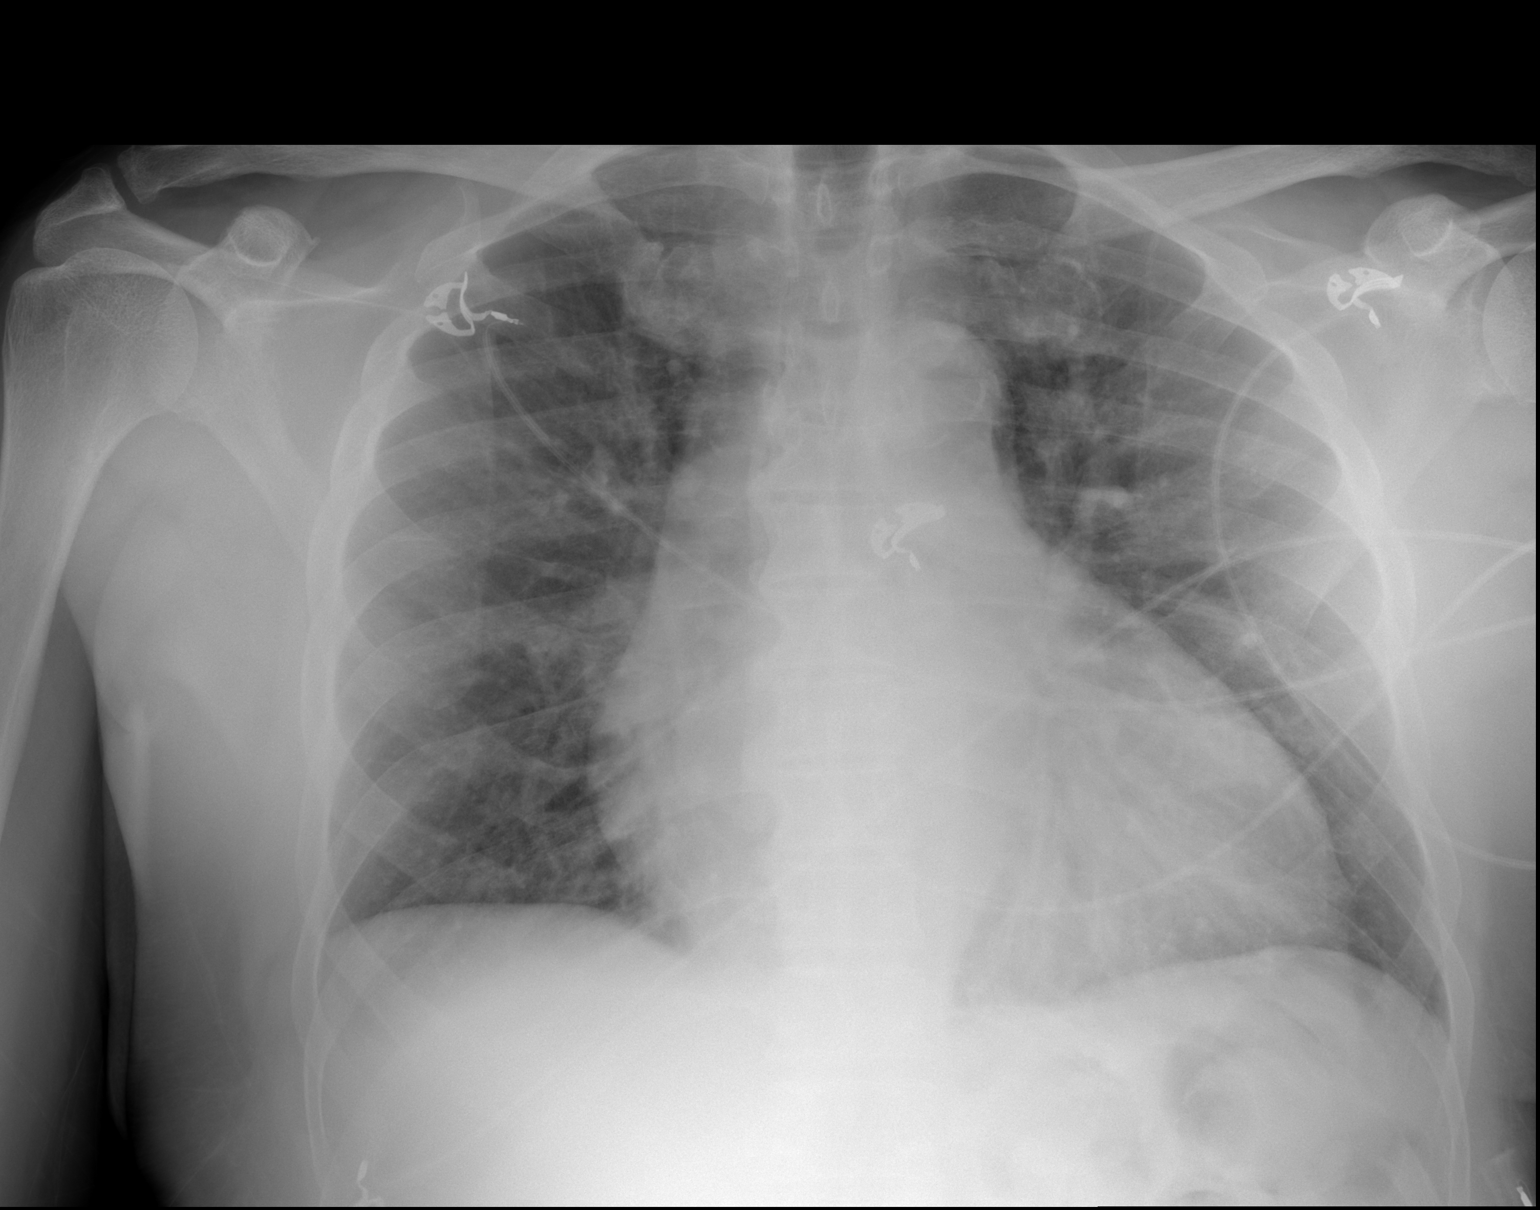

[w chest lat]
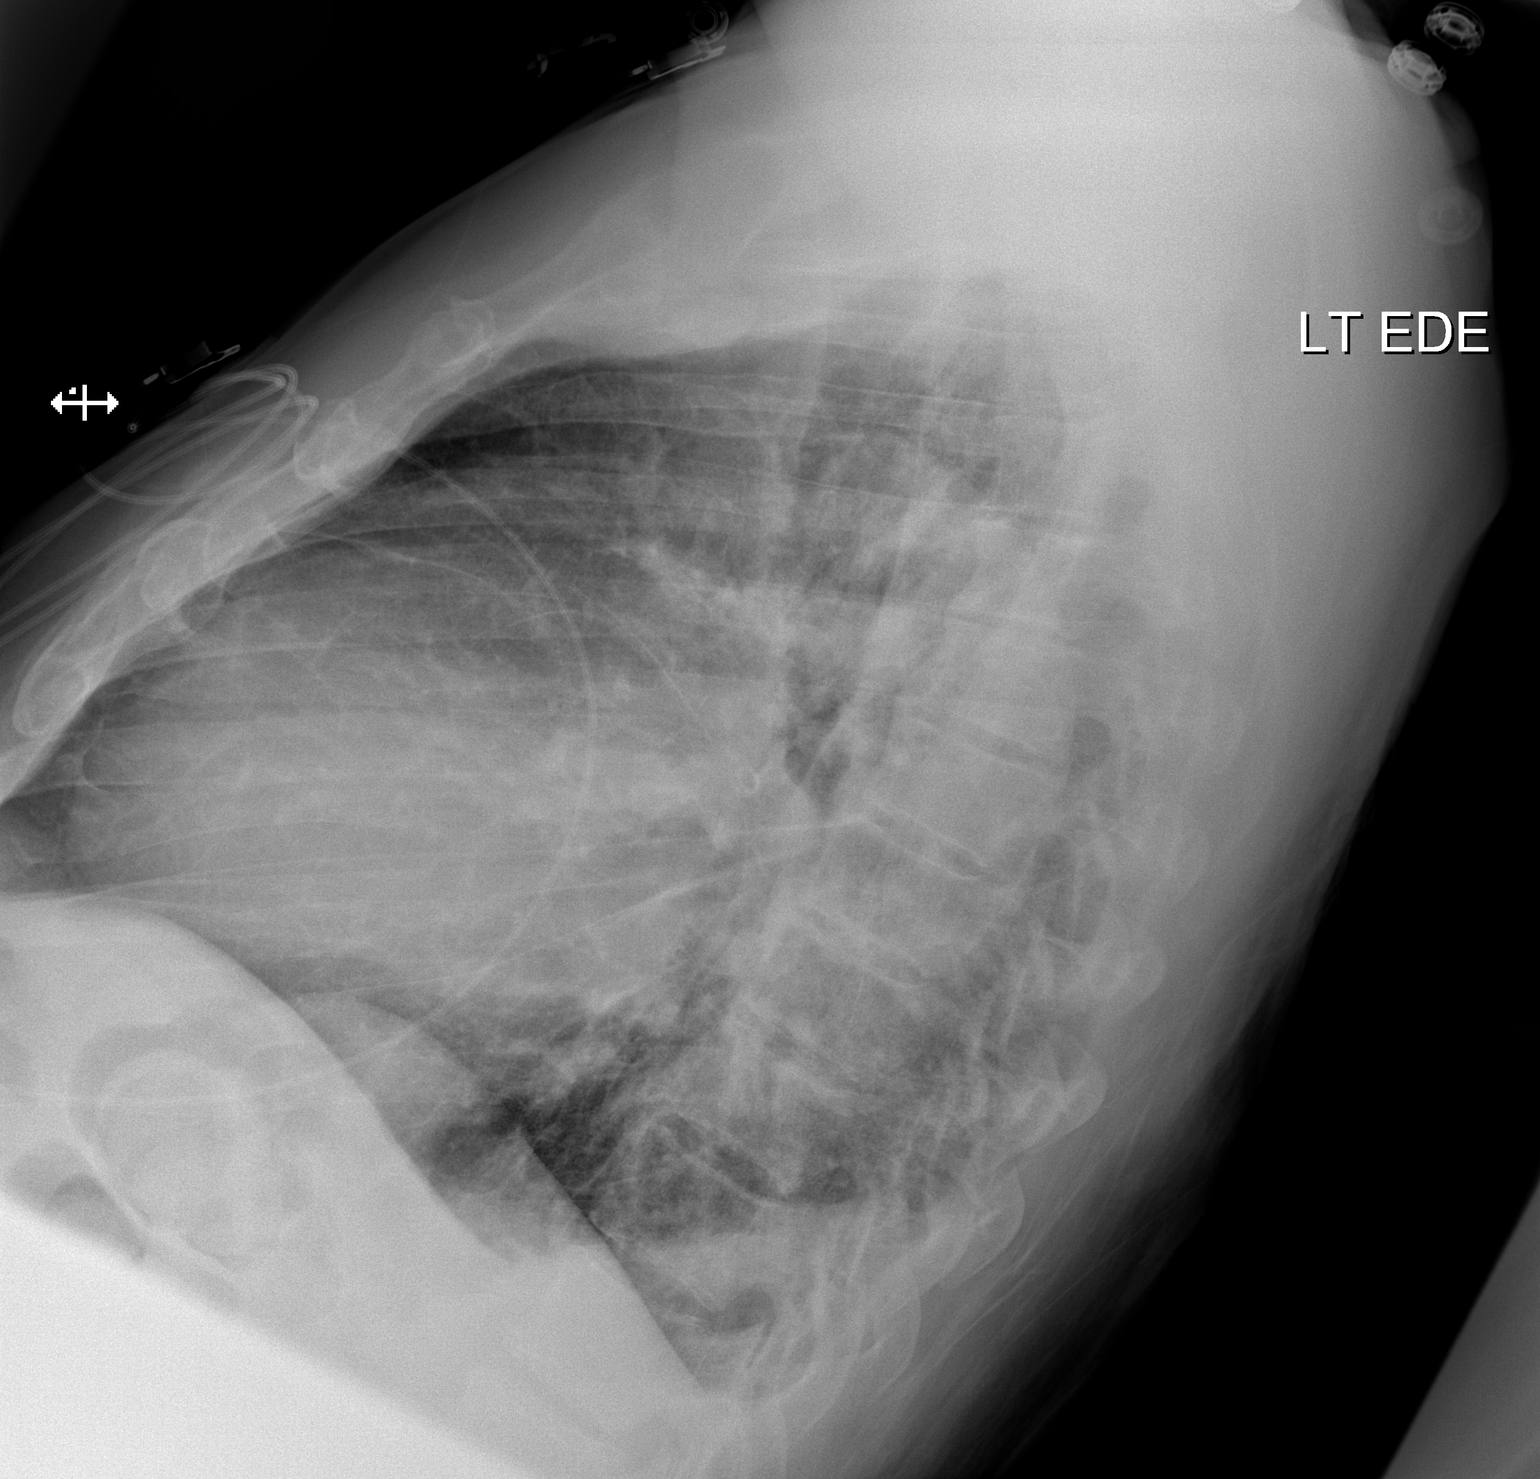

[2 of 2 positions shown; findings below may reference images not displayed]

FINDINGS: Cardiac shadow is enlarged with stable aortic calcifications. Lungs
are well aerated bilaterally. Previously seen pulmonary vascular
congestion and edema has nearly completely resolved. No focal
infiltrate or effusion is seen. No bony abnormality is noted.
IMPRESSION: Near complete resolution vascular congestion and edema.

## 2020-03-12 DIAGNOSIS — K91870 Postprocedural hematoma of a digestive system organ or structure following a digestive system procedure: Secondary | ICD-10-CM | POA: Diagnosis not present

## 2020-03-16 ENCOUNTER — Other Ambulatory Visit: Payer: Self-pay | Admitting: Nurse Practitioner

## 2020-03-16 DIAGNOSIS — I509 Heart failure, unspecified: Secondary | ICD-10-CM

## 2020-03-17 NOTE — Telephone Encounter (Signed)
Please see medication request.

## 2020-03-25 ENCOUNTER — Ambulatory Visit: Payer: Medicare Other | Admitting: Cardiology

## 2020-03-31 DIAGNOSIS — E119 Type 2 diabetes mellitus without complications: Secondary | ICD-10-CM | POA: Diagnosis not present

## 2020-03-31 DIAGNOSIS — Z940 Kidney transplant status: Secondary | ICD-10-CM | POA: Diagnosis not present

## 2020-04-02 DIAGNOSIS — I272 Pulmonary hypertension, unspecified: Secondary | ICD-10-CM | POA: Diagnosis not present

## 2020-04-02 DIAGNOSIS — I151 Hypertension secondary to other renal disorders: Secondary | ICD-10-CM | POA: Diagnosis not present

## 2020-04-02 DIAGNOSIS — Z23 Encounter for immunization: Secondary | ICD-10-CM | POA: Diagnosis not present

## 2020-04-02 DIAGNOSIS — N2889 Other specified disorders of kidney and ureter: Secondary | ICD-10-CM | POA: Diagnosis not present

## 2020-04-02 DIAGNOSIS — E119 Type 2 diabetes mellitus without complications: Secondary | ICD-10-CM | POA: Diagnosis not present

## 2020-04-02 DIAGNOSIS — M25569 Pain in unspecified knee: Secondary | ICD-10-CM | POA: Diagnosis not present

## 2020-04-02 DIAGNOSIS — Z79899 Other long term (current) drug therapy: Secondary | ICD-10-CM | POA: Diagnosis not present

## 2020-04-02 DIAGNOSIS — E785 Hyperlipidemia, unspecified: Secondary | ICD-10-CM | POA: Diagnosis not present

## 2020-04-02 DIAGNOSIS — G8929 Other chronic pain: Secondary | ICD-10-CM | POA: Diagnosis not present

## 2020-04-02 DIAGNOSIS — I4892 Unspecified atrial flutter: Secondary | ICD-10-CM | POA: Diagnosis not present

## 2020-04-02 DIAGNOSIS — Z940 Kidney transplant status: Secondary | ICD-10-CM | POA: Diagnosis not present

## 2020-04-18 ENCOUNTER — Ambulatory Visit (INDEPENDENT_AMBULATORY_CARE_PROVIDER_SITE_OTHER): Payer: Medicare Other | Admitting: Cardiology

## 2020-04-18 ENCOUNTER — Encounter: Payer: Self-pay | Admitting: Cardiology

## 2020-04-18 ENCOUNTER — Other Ambulatory Visit: Payer: Self-pay

## 2020-04-18 ENCOUNTER — Other Ambulatory Visit: Payer: Self-pay | Admitting: Nurse Practitioner

## 2020-04-18 ENCOUNTER — Telehealth: Payer: Self-pay | Admitting: Nurse Practitioner

## 2020-04-18 VITALS — BP 138/77 | HR 82 | Ht 70.0 in | Wt 208.0 lb

## 2020-04-18 DIAGNOSIS — I10 Essential (primary) hypertension: Secondary | ICD-10-CM

## 2020-04-18 DIAGNOSIS — I872 Venous insufficiency (chronic) (peripheral): Secondary | ICD-10-CM

## 2020-04-18 DIAGNOSIS — I5032 Chronic diastolic (congestive) heart failure: Secondary | ICD-10-CM

## 2020-04-18 DIAGNOSIS — I4811 Longstanding persistent atrial fibrillation: Secondary | ICD-10-CM | POA: Diagnosis not present

## 2020-04-18 MED ORDER — BD PEN NEEDLE MINI U/F 31G X 5 MM MISC: 11 refills | Status: AC

## 2020-04-18 NOTE — Telephone Encounter (Signed)
sent 

## 2020-04-18 NOTE — Progress Notes (Unsigned)
   [facility] [address] [place], [place]  [address] Phone:  [phone]   Fax:  [phone] 

## 2020-04-18 NOTE — Patient Instructions (Addendum)
Dionisio David, NP. Office number [phone]. Please call regarding your questions re: insulin.  Medication Instructions:  The current medical regimen is effective;  continue present plan and medications as directed. Please refer to the Current Medication list given to you today.  *If you need a refill on your cardiac medications before your next appointment, please call your pharmacy*   Lab Work: NONE  Testing/Procedures: NONE  Follow-Up: At [facility], you and your health needs are our priority.  As part of our continuing mission to provide you with exceptional heart care, we have created designated Provider Care Teams.  These Care Teams include your primary Cardiologist (physician) and Advanced Practice Providers (APPs -  Physician Assistants and Nurse Practitioners) who all work together to provide you with the care you need, when you need it.  Your next appointment:   6 month(s)  The format for your next appointment:   In Person  Provider:   You may see Randy Dresser, MD or one of the following Advanced Practice Providers on your designated Care Team:  Randy Ferries, PA-C Randy Sims, DNP, ANP
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
## 2020-04-30 ENCOUNTER — Ambulatory Visit (INDEPENDENT_AMBULATORY_CARE_PROVIDER_SITE_OTHER): Payer: Medicare Other | Admitting: Nurse Practitioner

## 2020-04-30 ENCOUNTER — Other Ambulatory Visit: Payer: Self-pay

## 2020-04-30 ENCOUNTER — Ambulatory Visit (HOSPITAL_COMMUNITY)
Admission: RE | Admit: 2020-04-30 | Discharge: 2020-04-30 | Disposition: A | Discharge status: Home / Self Care | Payer: Medicare Other | Source: Ambulatory Visit | Attending: Nurse Practitioner | Admitting: Nurse Practitioner

## 2020-04-30 ENCOUNTER — Encounter: Payer: Self-pay | Admitting: Nurse Practitioner

## 2020-04-30 VITALS — BP 120/66 | HR 76 | Temp 97.5°F | Resp 18 | Ht 70.0 in | Wt 206.8 lb

## 2020-04-30 DIAGNOSIS — G8929 Other chronic pain: Secondary | ICD-10-CM | POA: Insufficient documentation

## 2020-04-30 DIAGNOSIS — Z940 Kidney transplant status: Secondary | ICD-10-CM

## 2020-04-30 DIAGNOSIS — I10 Essential (primary) hypertension: Secondary | ICD-10-CM

## 2020-04-30 DIAGNOSIS — E1165 Type 2 diabetes mellitus with hyperglycemia: Secondary | ICD-10-CM

## 2020-04-30 DIAGNOSIS — M545 Low back pain, unspecified: Secondary | ICD-10-CM | POA: Insufficient documentation

## 2020-04-30 DIAGNOSIS — M47816 Spondylosis without myelopathy or radiculopathy, lumbar region: Secondary | ICD-10-CM | POA: Diagnosis not present

## 2020-04-30 DIAGNOSIS — E118 Type 2 diabetes mellitus with unspecified complications: Secondary | ICD-10-CM | POA: Diagnosis not present

## 2020-04-30 LAB — POCT URINALYSIS DIP (CLINITEK)
Bilirubin, UA: NEGATIVE
Glucose, UA: NEGATIVE mg/dL
Ketones, POC UA: NEGATIVE mg/dL
Leukocytes, UA: NEGATIVE
Nitrite, UA: NEGATIVE
POC PROTEIN,UA: >=300 — AB
Spec Grav, UA: 1.02 (ref 1.010–1.025)
Urobilinogen, UA: 1 E.U./dL
pH, UA: 6.5 (ref 5.0–8.0)

## 2020-04-30 LAB — GLUCOSE, POCT (MANUAL RESULT ENTRY): POC Glucose: 154 mg/dl — AB (ref 70–99)

## 2020-04-30 MED ORDER — NOVOLOG MIX 70/30 FLEXPEN (70-30) 100 UNIT/ML ~~LOC~~ SUPN
50.0000 [IU] | PEN_INJECTOR | Freq: Two times a day (BID) | SUBCUTANEOUS | 11 refills | Status: DC
Start: 2020-04-30 — End: 2020-05-02

## 2020-04-30 MED ORDER — AMOXICILLIN-POT CLAVULANATE 875-125 MG PO TABS: 1.0000 | ORAL_TABLET | Freq: Two times a day (BID) | ORAL | 0 refills | Status: AC

## 2020-04-30 MED ORDER — CARVEDILOL 25 MG PO TABS: 25.0000 mg | ORAL_TABLET | Freq: Two times a day (BID) | ORAL | 3 refills | Status: AC

## 2020-04-30 NOTE — Patient Instructions (Signed)
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
## 2020-05-01 ENCOUNTER — Telehealth: Payer: Self-pay | Admitting: Nurse Practitioner

## 2020-05-01 NOTE — Telephone Encounter (Signed)
70/30 FLEX PEN. Directions 50 units twice day with meal  20 units in morning & 10 units evening if over 125.  CLARIFY INSTRUCTION FOR [facility]. [phone]

## 2020-05-02 ENCOUNTER — Other Ambulatory Visit: Payer: Self-pay | Admitting: Nurse Practitioner

## 2020-05-02 DIAGNOSIS — E1165 Type 2 diabetes mellitus with hyperglycemia: Secondary | ICD-10-CM

## 2020-05-02 MED ORDER — NOVOLOG MIX 70/30 FLEXPEN (70-30) 100 UNIT/ML ~~LOC~~ SUPN: 50.0000 [IU] | PEN_INJECTOR | Freq: Two times a day (BID) | SUBCUTANEOUS | 11 refills | Status: AC

## 2020-05-02 MED ORDER — NOVOLOG MIX 70/30 FLEXPEN (70-30) 100 UNIT/ML ~~LOC~~ SUPN: 50.0000 [IU] | PEN_INJECTOR | Freq: Two times a day (BID) | SUBCUTANEOUS | 11 refills | Status: DC

## 2020-05-02 NOTE — Telephone Encounter (Signed)
New order sent  Thanks this was refill from previous provider.  I apologize for not clarifying

## 2020-05-13 ENCOUNTER — Emergency Department (HOSPITAL_COMMUNITY): Payer: Medicare Other

## 2020-05-13 ENCOUNTER — Inpatient Hospital Stay (HOSPITAL_COMMUNITY)
Admission: EM | Admit: 2020-05-13 | Discharge: 2020-06-10 | DRG: 871 | Disposition: E | Payer: Medicare Other | Attending: Pulmonary Disease | Admitting: Pulmonary Disease

## 2020-05-13 ENCOUNTER — Other Ambulatory Visit: Payer: Self-pay

## 2020-05-13 DIAGNOSIS — Y830 Surgical operation with transplant of whole organ as the cause of abnormal reaction of the patient, or of later complication, without mention of misadventure at the time of the procedure: Secondary | ICD-10-CM | POA: Diagnosis present

## 2020-05-13 DIAGNOSIS — J069 Acute upper respiratory infection, unspecified: Secondary | ICD-10-CM | POA: Diagnosis not present

## 2020-05-13 DIAGNOSIS — I272 Pulmonary hypertension, unspecified: Secondary | ICD-10-CM | POA: Diagnosis present

## 2020-05-13 DIAGNOSIS — E1122 Type 2 diabetes mellitus with diabetic chronic kidney disease: Secondary | ICD-10-CM | POA: Diagnosis present

## 2020-05-13 DIAGNOSIS — I708 Atherosclerosis of other arteries: Secondary | ICD-10-CM | POA: Diagnosis not present

## 2020-05-13 DIAGNOSIS — T380X5A Adverse effect of glucocorticoids and synthetic analogues, initial encounter: Secondary | ICD-10-CM | POA: Diagnosis not present

## 2020-05-13 DIAGNOSIS — B9789 Other viral agents as the cause of diseases classified elsewhere: Secondary | ICD-10-CM

## 2020-05-13 DIAGNOSIS — J1282 Pneumonia due to coronavirus disease 2019: Secondary | ICD-10-CM | POA: Diagnosis not present

## 2020-05-13 DIAGNOSIS — I130 Hypertensive heart and chronic kidney disease with heart failure and stage 1 through stage 4 chronic kidney disease, or unspecified chronic kidney disease: Secondary | ICD-10-CM | POA: Diagnosis present

## 2020-05-13 DIAGNOSIS — I469 Cardiac arrest, cause unspecified: Secondary | ICD-10-CM | POA: Diagnosis not present

## 2020-05-13 DIAGNOSIS — I6330 Cerebral infarction due to thrombosis of unspecified cerebral artery: Secondary | ICD-10-CM | POA: Insufficient documentation

## 2020-05-13 DIAGNOSIS — E875 Hyperkalemia: Secondary | ICD-10-CM | POA: Diagnosis present

## 2020-05-13 DIAGNOSIS — R140 Abdominal distension (gaseous): Secondary | ICD-10-CM

## 2020-05-13 DIAGNOSIS — Z5320 Procedure and treatment not carried out because of patient's decision for unspecified reasons: Secondary | ICD-10-CM | POA: Diagnosis present

## 2020-05-13 DIAGNOSIS — E1165 Type 2 diabetes mellitus with hyperglycemia: Secondary | ICD-10-CM | POA: Diagnosis not present

## 2020-05-13 DIAGNOSIS — G9349 Other encephalopathy: Secondary | ICD-10-CM | POA: Diagnosis not present

## 2020-05-13 DIAGNOSIS — N183 Chronic kidney disease, stage 3 unspecified: Secondary | ICD-10-CM | POA: Diagnosis not present

## 2020-05-13 DIAGNOSIS — U071 COVID-19: Secondary | ICD-10-CM | POA: Diagnosis not present

## 2020-05-13 DIAGNOSIS — D849 Immunodeficiency, unspecified: Secondary | ICD-10-CM | POA: Diagnosis present

## 2020-05-13 DIAGNOSIS — T8619 Other complication of kidney transplant: Secondary | ICD-10-CM | POA: Diagnosis present

## 2020-05-13 DIAGNOSIS — B1920 Unspecified viral hepatitis C without hepatic coma: Secondary | ICD-10-CM | POA: Diagnosis present

## 2020-05-13 DIAGNOSIS — J9601 Acute respiratory failure with hypoxia: Secondary | ICD-10-CM

## 2020-05-13 DIAGNOSIS — E872 Acidosis: Secondary | ICD-10-CM | POA: Diagnosis present

## 2020-05-13 DIAGNOSIS — I5030 Unspecified diastolic (congestive) heart failure: Secondary | ICD-10-CM | POA: Diagnosis not present

## 2020-05-13 DIAGNOSIS — R0602 Shortness of breath: Secondary | ICD-10-CM | POA: Diagnosis not present

## 2020-05-13 DIAGNOSIS — K219 Gastro-esophageal reflux disease without esophagitis: Secondary | ICD-10-CM | POA: Diagnosis present

## 2020-05-13 DIAGNOSIS — R4182 Altered mental status, unspecified: Secondary | ICD-10-CM | POA: Diagnosis not present

## 2020-05-13 DIAGNOSIS — I482 Chronic atrial fibrillation, unspecified: Secondary | ICD-10-CM | POA: Diagnosis present

## 2020-05-13 DIAGNOSIS — I4892 Unspecified atrial flutter: Secondary | ICD-10-CM | POA: Diagnosis present

## 2020-05-13 DIAGNOSIS — K922 Gastrointestinal hemorrhage, unspecified: Secondary | ICD-10-CM | POA: Diagnosis present

## 2020-05-13 DIAGNOSIS — E559 Vitamin D deficiency, unspecified: Secondary | ICD-10-CM | POA: Diagnosis present

## 2020-05-13 DIAGNOSIS — I700 Atherosclerosis of aorta: Secondary | ICD-10-CM | POA: Diagnosis present

## 2020-05-13 DIAGNOSIS — R0902 Hypoxemia: Secondary | ICD-10-CM

## 2020-05-13 DIAGNOSIS — I890 Lymphedema, not elsewhere classified: Secondary | ICD-10-CM | POA: Diagnosis present

## 2020-05-13 DIAGNOSIS — N289 Disorder of kidney and ureter, unspecified: Secondary | ICD-10-CM | POA: Diagnosis not present

## 2020-05-13 DIAGNOSIS — I6389 Other cerebral infarction: Secondary | ICD-10-CM | POA: Diagnosis not present

## 2020-05-13 DIAGNOSIS — I5032 Chronic diastolic (congestive) heart failure: Secondary | ICD-10-CM | POA: Diagnosis present

## 2020-05-13 DIAGNOSIS — N179 Acute kidney failure, unspecified: Secondary | ICD-10-CM | POA: Diagnosis present

## 2020-05-13 DIAGNOSIS — R6521 Severe sepsis with septic shock: Secondary | ICD-10-CM | POA: Diagnosis not present

## 2020-05-13 DIAGNOSIS — I4891 Unspecified atrial fibrillation: Secondary | ICD-10-CM | POA: Diagnosis not present

## 2020-05-13 DIAGNOSIS — I2510 Atherosclerotic heart disease of native coronary artery without angina pectoris: Secondary | ICD-10-CM | POA: Diagnosis present

## 2020-05-13 DIAGNOSIS — I63432 Cerebral infarction due to embolism of left posterior cerebral artery: Secondary | ICD-10-CM | POA: Diagnosis not present

## 2020-05-13 DIAGNOSIS — I517 Cardiomegaly: Secondary | ICD-10-CM | POA: Diagnosis not present

## 2020-05-13 DIAGNOSIS — J80 Acute respiratory distress syndrome: Secondary | ICD-10-CM | POA: Diagnosis present

## 2020-05-13 DIAGNOSIS — E871 Hypo-osmolality and hyponatremia: Secondary | ICD-10-CM | POA: Diagnosis present

## 2020-05-13 DIAGNOSIS — R0600 Dyspnea, unspecified: Secondary | ICD-10-CM | POA: Diagnosis not present

## 2020-05-13 DIAGNOSIS — Z888 Allergy status to other drugs, medicaments and biological substances status: Secondary | ICD-10-CM

## 2020-05-13 DIAGNOSIS — A4189 Other specified sepsis: Principal | ICD-10-CM | POA: Diagnosis present

## 2020-05-13 DIAGNOSIS — Z940 Kidney transplant status: Secondary | ICD-10-CM | POA: Diagnosis not present

## 2020-05-13 DIAGNOSIS — N186 End stage renal disease: Secondary | ICD-10-CM | POA: Diagnosis present

## 2020-05-13 DIAGNOSIS — Z4682 Encounter for fitting and adjustment of non-vascular catheter: Secondary | ICD-10-CM | POA: Diagnosis not present

## 2020-05-13 DIAGNOSIS — A419 Sepsis, unspecified organism: Secondary | ICD-10-CM | POA: Diagnosis not present

## 2020-05-13 DIAGNOSIS — Z978 Presence of other specified devices: Secondary | ICD-10-CM

## 2020-05-13 DIAGNOSIS — Z791 Long term (current) use of non-steroidal anti-inflammatories (NSAID): Secondary | ICD-10-CM

## 2020-05-13 DIAGNOSIS — J811 Chronic pulmonary edema: Secondary | ICD-10-CM | POA: Diagnosis not present

## 2020-05-13 DIAGNOSIS — D6869 Other thrombophilia: Secondary | ICD-10-CM | POA: Diagnosis present

## 2020-05-13 DIAGNOSIS — R6520 Severe sepsis without septic shock: Secondary | ICD-10-CM | POA: Diagnosis not present

## 2020-05-13 DIAGNOSIS — R059 Cough, unspecified: Secondary | ICD-10-CM | POA: Diagnosis not present

## 2020-05-13 DIAGNOSIS — R509 Fever, unspecified: Secondary | ICD-10-CM | POA: Diagnosis not present

## 2020-05-13 DIAGNOSIS — Z905 Acquired absence of kidney: Secondary | ICD-10-CM | POA: Diagnosis not present

## 2020-05-13 DIAGNOSIS — Z9119 Patient's noncompliance with other medical treatment and regimen: Secondary | ICD-10-CM

## 2020-05-13 DIAGNOSIS — K5900 Constipation, unspecified: Secondary | ICD-10-CM | POA: Diagnosis not present

## 2020-05-13 DIAGNOSIS — Z79899 Other long term (current) drug therapy: Secondary | ICD-10-CM

## 2020-05-13 DIAGNOSIS — Z800 Family history of malignant neoplasm of digestive organs: Secondary | ICD-10-CM

## 2020-05-13 DIAGNOSIS — E785 Hyperlipidemia, unspecified: Secondary | ICD-10-CM | POA: Diagnosis present

## 2020-05-13 DIAGNOSIS — I771 Stricture of artery: Secondary | ICD-10-CM | POA: Diagnosis not present

## 2020-05-13 DIAGNOSIS — IMO0002 Reserved for concepts with insufficient information to code with codable children: Secondary | ICD-10-CM | POA: Diagnosis present

## 2020-05-13 DIAGNOSIS — R4702 Dysphasia: Secondary | ICD-10-CM | POA: Diagnosis not present

## 2020-05-13 DIAGNOSIS — E878 Other disorders of electrolyte and fluid balance, not elsewhere classified: Secondary | ICD-10-CM | POA: Diagnosis not present

## 2020-05-13 DIAGNOSIS — Z7901 Long term (current) use of anticoagulants: Secondary | ICD-10-CM

## 2020-05-13 DIAGNOSIS — I132 Hypertensive heart and chronic kidney disease with heart failure and with stage 5 chronic kidney disease, or end stage renal disease: Secondary | ICD-10-CM | POA: Diagnosis present

## 2020-05-13 DIAGNOSIS — Z66 Do not resuscitate: Secondary | ICD-10-CM | POA: Diagnosis not present

## 2020-05-13 DIAGNOSIS — I6340 Cerebral infarction due to embolism of unspecified cerebral artery: Secondary | ICD-10-CM | POA: Diagnosis not present

## 2020-05-13 DIAGNOSIS — E11649 Type 2 diabetes mellitus with hypoglycemia without coma: Secondary | ICD-10-CM | POA: Diagnosis present

## 2020-05-13 DIAGNOSIS — Z7982 Long term (current) use of aspirin: Secondary | ICD-10-CM

## 2020-05-13 DIAGNOSIS — G9341 Metabolic encephalopathy: Secondary | ICD-10-CM | POA: Diagnosis present

## 2020-05-13 DIAGNOSIS — I639 Cerebral infarction, unspecified: Secondary | ICD-10-CM | POA: Diagnosis not present

## 2020-05-13 DIAGNOSIS — Z96652 Presence of left artificial knee joint: Secondary | ICD-10-CM | POA: Diagnosis present

## 2020-05-13 DIAGNOSIS — Z8249 Family history of ischemic heart disease and other diseases of the circulatory system: Secondary | ICD-10-CM

## 2020-05-13 DIAGNOSIS — E870 Hyperosmolality and hypernatremia: Secondary | ICD-10-CM | POA: Diagnosis not present

## 2020-05-13 DIAGNOSIS — N1832 Chronic kidney disease, stage 3b: Secondary | ICD-10-CM | POA: Diagnosis not present

## 2020-05-13 DIAGNOSIS — Z794 Long term (current) use of insulin: Secondary | ICD-10-CM

## 2020-05-13 DIAGNOSIS — I63532 Cerebral infarction due to unspecified occlusion or stenosis of left posterior cerebral artery: Secondary | ICD-10-CM | POA: Diagnosis not present

## 2020-05-13 DIAGNOSIS — R29732 NIHSS score 32: Secondary | ICD-10-CM | POA: Diagnosis present

## 2020-05-13 LAB — BASIC METABOLIC PANEL
Anion gap: 13 (ref 5–15)
BUN: 48 mg/dL — ABNORMAL HIGH (ref 8–23)
CO2: 17 mmol/L — ABNORMAL LOW (ref 22–32)
Calcium: 9 mg/dL (ref 8.9–10.3)
Chloride: 98 mmol/L (ref 98–111)
Creatinine, Ser: 2.55 mg/dL — ABNORMAL HIGH (ref 0.61–1.24)
GFR, Estimated: 27 mL/min — ABNORMAL LOW (ref >60)
Glucose, Bld: 230 mg/dL — ABNORMAL HIGH (ref 70–99)
Potassium: 5.2 mmol/L — ABNORMAL HIGH (ref 3.5–5.1)
Sodium: 128 mmol/L — ABNORMAL LOW (ref 135–145)

## 2020-05-13 LAB — CBC
HCT: 49.3 % (ref 39.0–52.0)
Hemoglobin: 15.4 g/dL (ref 13.0–17.0)
MCH: 25.3 pg — ABNORMAL LOW (ref 26.0–34.0)
MCHC: 31.2 g/dL (ref 30.0–36.0)
MCV: 81.1 fL (ref 80.0–100.0)
Platelets: 268 10*3/uL (ref 150–400)
RBC: 6.08 MIL/uL — ABNORMAL HIGH (ref 4.22–5.81)
RDW: 14.4 % (ref 11.5–15.5)
WBC: 8.2 10*3/uL (ref 4.0–10.5)
nRBC: 0 % (ref 0.0–0.2)

## 2020-05-13 LAB — D-DIMER, QUANTITATIVE: D-Dimer, Quant: 8.02 ug/mL-FEU — ABNORMAL HIGH (ref 0.00–0.50)

## 2020-05-13 LAB — PROCALCITONIN: Procalcitonin: 0.24 ng/mL

## 2020-05-13 LAB — C-REACTIVE PROTEIN: CRP: 21.2 mg/dL — ABNORMAL HIGH (ref <1.0)

## 2020-05-13 LAB — HIV ANTIBODY (ROUTINE TESTING W REFLEX): HIV Screen 4th Generation wRfx: NONREACTIVE

## 2020-05-13 LAB — FIBRINOGEN: Fibrinogen: 781 mg/dL — ABNORMAL HIGH (ref 210–475)

## 2020-05-13 LAB — RESP PANEL BY RT-PCR (FLU A&B, COVID) ARPGX2
Influenza A by PCR: NEGATIVE
Influenza B by PCR: NEGATIVE
SARS Coronavirus 2 by RT PCR: POSITIVE — AB

## 2020-05-13 LAB — BRAIN NATRIURETIC PEPTIDE: B Natriuretic Peptide: 308.4 pg/mL — ABNORMAL HIGH (ref 0.0–100.0)

## 2020-05-13 LAB — FERRITIN: Ferritin: 1869 ng/mL — ABNORMAL HIGH (ref 24–336)

## 2020-05-13 LAB — CBG MONITORING, ED
Glucose-Capillary: 274 mg/dL — ABNORMAL HIGH (ref 70–99)
Glucose-Capillary: 276 mg/dL — ABNORMAL HIGH (ref 70–99)
Glucose-Capillary: 285 mg/dL — ABNORMAL HIGH (ref 70–99)

## 2020-05-13 LAB — LACTIC ACID, PLASMA: Lactic Acid, Venous: 1.5 mmol/L (ref 0.5–1.9)

## 2020-05-13 LAB — TROPONIN I (HIGH SENSITIVITY)
Troponin I (High Sensitivity): 12 ng/L (ref <18)
Troponin I (High Sensitivity): 13 ng/L (ref <18)

## 2020-05-13 LAB — POC SARS CORONAVIRUS 2 AG -  ED: SARS Coronavirus 2 Ag: NEGATIVE

## 2020-05-13 MED ORDER — ACETAMINOPHEN 325 MG PO TABS
650.0000 mg | ORAL_TABLET | Freq: Four times a day (QID) | ORAL | Status: DC | PRN
  Administered 2020-05-13 – 2020-05-20 (×2): 650 mg via ORAL
  Filled 2020-05-13 (×2): qty 2

## 2020-05-13 MED ORDER — HYDROCOD POLST-CPM POLST ER 10-8 MG/5ML PO SUER: 5.0000 mL | Freq: Two times a day (BID) | ORAL | Status: DC | PRN

## 2020-05-13 MED ORDER — DEXAMETHASONE SODIUM PHOSPHATE 10 MG/ML IJ SOLN
10.0000 mg | Freq: Once | INTRAMUSCULAR | Status: AC
  Administered 2020-05-13: 10 mg via INTRAVENOUS
  Filled 2020-05-13: qty 1

## 2020-05-13 MED ORDER — ASCORBIC ACID 500 MG PO TABS
500.0000 mg | ORAL_TABLET | Freq: Every day | ORAL | Status: DC
  Administered 2020-05-13 – 2020-05-20 (×7): 500 mg via ORAL
  Filled 2020-05-13 (×7): qty 1

## 2020-05-13 MED ORDER — APIXABAN 5 MG PO TABS
5.0000 mg | ORAL_TABLET | Freq: Two times a day (BID) | ORAL | Status: DC
  Administered 2020-05-13 – 2020-05-19 (×9): 5 mg via ORAL
  Filled 2020-05-13 (×9): qty 1

## 2020-05-13 MED ORDER — INSULIN DETEMIR 100 UNIT/ML ~~LOC~~ SOLN
0.1000 [IU]/kg | Freq: Two times a day (BID) | SUBCUTANEOUS | Status: DC
  Administered 2020-05-13 – 2020-05-14 (×2): 9 [IU] via SUBCUTANEOUS
  Filled 2020-05-13 (×4): qty 0.09

## 2020-05-13 MED ORDER — PREDNISONE 5 MG PO TABS: 50.0000 mg | ORAL_TABLET | Freq: Every day | ORAL | Status: DC

## 2020-05-13 MED ORDER — AEROCHAMBER PLUS FLO-VU LARGE MISC
Status: AC
  Filled 2020-05-13: qty 1

## 2020-05-13 MED ORDER — PANTOPRAZOLE SODIUM 40 MG PO TBEC
40.0000 mg | DELAYED_RELEASE_TABLET | Freq: Every day | ORAL | Status: DC
  Administered 2020-05-13 – 2020-05-20 (×7): 40 mg via ORAL
  Filled 2020-05-13 (×7): qty 1

## 2020-05-13 MED ORDER — IPRATROPIUM-ALBUTEROL 20-100 MCG/ACT IN AERS
1.0000 | INHALATION_SPRAY | Freq: Four times a day (QID) | RESPIRATORY_TRACT | Status: DC
  Administered 2020-05-14 – 2020-05-16 (×11): 1 via RESPIRATORY_TRACT
  Filled 2020-05-13 (×2): qty 4

## 2020-05-13 MED ORDER — POLYETHYLENE GLYCOL 3350 17 G PO PACK: 17.0000 g | PACK | Freq: Every day | ORAL | Status: DC | PRN

## 2020-05-13 MED ORDER — GUAIFENESIN-DM 100-10 MG/5ML PO SYRP
10.0000 mL | ORAL_SOLUTION | ORAL | Status: DC | PRN
  Administered 2020-05-14 – 2020-05-15 (×2): 10 mL via ORAL
  Filled 2020-05-13 (×2): qty 10

## 2020-05-13 MED ORDER — INSULIN ASPART 100 UNIT/ML ~~LOC~~ SOLN
0.0000 [IU] | SUBCUTANEOUS | Status: DC
  Administered 2020-05-13 – 2020-05-14 (×4): 5 [IU] via SUBCUTANEOUS
  Administered 2020-05-14: 3 [IU] via SUBCUTANEOUS
  Administered 2020-05-15: 5 [IU] via SUBCUTANEOUS
  Administered 2020-05-15 (×2): 3 [IU] via SUBCUTANEOUS
  Administered 2020-05-15: 2 [IU] via SUBCUTANEOUS
  Administered 2020-05-15: 7 [IU] via SUBCUTANEOUS
  Administered 2020-05-17: 3 [IU] via SUBCUTANEOUS
  Administered 2020-05-17: 5 [IU] via SUBCUTANEOUS
  Administered 2020-05-17: 2 [IU] via SUBCUTANEOUS
  Administered 2020-05-18 (×3): 3 [IU] via SUBCUTANEOUS
  Administered 2020-05-18: 5 [IU] via SUBCUTANEOUS
  Administered 2020-05-18: 3 [IU] via SUBCUTANEOUS
  Administered 2020-05-18: 5 [IU] via SUBCUTANEOUS
  Administered 2020-05-19: 1 [IU] via SUBCUTANEOUS
  Administered 2020-05-19 (×2): 2 [IU] via SUBCUTANEOUS
  Administered 2020-05-19: 3 [IU] via SUBCUTANEOUS
  Administered 2020-05-19 (×2): 2 [IU] via SUBCUTANEOUS
  Administered 2020-05-20 (×2): 3 [IU] via SUBCUTANEOUS

## 2020-05-13 MED ORDER — SODIUM CHLORIDE 0.9 % IV SOLN: 100.0000 mg | Freq: Every day | INTRAVENOUS | Status: DC

## 2020-05-13 MED ORDER — TACROLIMUS 1 MG PO CAPS: 2.0000 mg | ORAL_CAPSULE | Freq: Two times a day (BID) | ORAL | Status: DC

## 2020-05-13 MED ORDER — SENNOSIDES-DOCUSATE SODIUM 8.6-50 MG PO TABS
1.0000 | ORAL_TABLET | Freq: Every day | ORAL | Status: DC
  Administered 2020-05-14 – 2020-05-19 (×4): 1 via ORAL
  Filled 2020-05-13 (×4): qty 1

## 2020-05-13 MED ORDER — ZINC SULFATE 220 (50 ZN) MG PO CAPS
220.0000 mg | ORAL_CAPSULE | Freq: Every day | ORAL | Status: DC
  Administered 2020-05-13 – 2020-05-20 (×7): 220 mg via ORAL
  Filled 2020-05-13 (×8): qty 1

## 2020-05-13 MED ORDER — ONDANSETRON HCL 4 MG PO TABS: 4.0000 mg | ORAL_TABLET | Freq: Four times a day (QID) | ORAL | Status: DC | PRN

## 2020-05-13 MED ORDER — CARVEDILOL 25 MG PO TABS
25.0000 mg | ORAL_TABLET | Freq: Two times a day (BID) | ORAL | Status: DC
  Administered 2020-05-13: 25 mg via ORAL
  Filled 2020-05-13: qty 8
  Filled 2020-05-13 (×2): qty 1

## 2020-05-13 MED ORDER — METHYLPREDNISOLONE SODIUM SUCC 125 MG IJ SOLR
1.0000 mg/kg | Freq: Two times a day (BID) | INTRAMUSCULAR | Status: AC
  Administered 2020-05-13 – 2020-05-16 (×6): 93.75 mg via INTRAVENOUS
  Filled 2020-05-13 (×6): qty 2

## 2020-05-13 MED ORDER — NIFEDIPINE ER OSMOTIC RELEASE 90 MG PO TB24
90.0000 mg | ORAL_TABLET | Freq: Every day | ORAL | Status: DC
  Administered 2020-05-13 – 2020-05-20 (×7): 90 mg via ORAL
  Filled 2020-05-13 (×9): qty 1

## 2020-05-13 MED ORDER — ALBUTEROL SULFATE HFA 108 (90 BASE) MCG/ACT IN AERS
6.0000 | INHALATION_SPRAY | Freq: Once | RESPIRATORY_TRACT | Status: AC
  Administered 2020-05-13: 6 via RESPIRATORY_TRACT
  Filled 2020-05-13: qty 6.7

## 2020-05-13 MED ORDER — MYCOPHENOLATE SODIUM 180 MG PO TBEC: 540.0000 mg | DELAYED_RELEASE_TABLET | Freq: Two times a day (BID) | ORAL | Status: DC

## 2020-05-13 MED ORDER — ALBUTEROL SULFATE HFA 108 (90 BASE) MCG/ACT IN AERS
1.0000 | INHALATION_SPRAY | Freq: Four times a day (QID) | RESPIRATORY_TRACT | Status: DC | PRN
  Administered 2020-05-13 – 2020-05-16 (×3): 2 via RESPIRATORY_TRACT

## 2020-05-13 MED ORDER — SODIUM CHLORIDE 0.9 % IV SOLN
200.0000 mg | Freq: Once | INTRAVENOUS | Status: DC
  Filled 2020-05-13: qty 40

## 2020-05-13 MED ORDER — SODIUM CHLORIDE 0.9 % IV SOLN: INTRAVENOUS | Status: AC

## 2020-05-13 MED ORDER — ASPIRIN EC 81 MG PO TBEC
81.0000 mg | DELAYED_RELEASE_TABLET | Freq: Every day | ORAL | Status: DC
  Administered 2020-05-13 – 2020-05-19 (×6): 81 mg via ORAL
  Filled 2020-05-13 (×6): qty 1

## 2020-05-13 MED ORDER — BISACODYL 10 MG RE SUPP: 10.0000 mg | Freq: Every day | RECTAL | Status: DC | PRN

## 2020-05-13 MED ORDER — ONDANSETRON HCL 4 MG/2ML IJ SOLN: 4.0000 mg | Freq: Four times a day (QID) | INTRAMUSCULAR | Status: DC | PRN

## 2020-05-13 MED ORDER — CLONIDINE HCL 0.3 MG PO TABS
0.3000 mg | ORAL_TABLET | Freq: Three times a day (TID) | ORAL | Status: DC
  Administered 2020-05-13 – 2020-05-19 (×14): 0.3 mg via ORAL
  Filled 2020-05-13 (×14): qty 1

## 2020-05-13 NOTE — Progress Notes (Signed)
RN called RT to bedside to assess pt. RT at bedside to find pt on NRB 15L with SpO2 88%. RT removed NRB and placed pt on 15L salter. Pt's SpO2 on the 15 salter is 85%. Based on pt's condition, RT will be placing pt on HHFNC.

## 2020-05-13 NOTE — ED Notes (Signed)
Pt unable to tolerate high flow-- plaed back on NRB resp aware.   Paged provider r/t new onset chest tightness and back pain. Will administer tylenol as ordered in [facility]. EKG done

## 2020-05-13 NOTE — ED Triage Notes (Signed)
Pt to ED from home. Pt complains of SOB & cough.   EMS found pt to be 76 on RA-- 92-93% on NRB. Ems reports fever of 103.   20g in R hand placed by EMS.  Pt awake and alert.

## 2020-05-13 NOTE — ED Notes (Signed)
Pt difficult stick. Phlebotomy assisted w/ blood draw to include single set of cultures

## 2020-05-13 NOTE — Progress Notes (Signed)
RT placed pt on HHFNC. Pt instructed on what RT was going. Pt tolerating well. RT will continue to monitor pt.
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
## 2020-05-13 NOTE — ED Notes (Signed)
Pt sompains of SOB, cough, and congestion x 3 days. Pt febrile 100.9 rectal temp.   Pt has fistula to left arm, but no longer requires dialysis.   Hx diabetes, HTN, CHF
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
## 2020-05-13 NOTE — Progress Notes (Signed)
RN called RT stating pt removed the [place] due to discomfort. RT instructed RN to decrease flow from 45 to 30L in hopes it will help with discomfort.

## 2020-05-13 NOTE — ED Notes (Signed)
Paged [facility] for RN

## 2020-05-14 ENCOUNTER — Inpatient Hospital Stay (HOSPITAL_COMMUNITY): Payer: Medicare Other

## 2020-05-14 DIAGNOSIS — I482 Chronic atrial fibrillation, unspecified: Secondary | ICD-10-CM

## 2020-05-14 DIAGNOSIS — U071 COVID-19: Secondary | ICD-10-CM | POA: Diagnosis not present

## 2020-05-14 DIAGNOSIS — N1832 Chronic kidney disease, stage 3b: Secondary | ICD-10-CM

## 2020-05-14 DIAGNOSIS — J9601 Acute respiratory failure with hypoxia: Secondary | ICD-10-CM

## 2020-05-14 DIAGNOSIS — N179 Acute kidney failure, unspecified: Secondary | ICD-10-CM | POA: Diagnosis not present

## 2020-05-14 DIAGNOSIS — A419 Sepsis, unspecified organism: Secondary | ICD-10-CM

## 2020-05-14 DIAGNOSIS — E1165 Type 2 diabetes mellitus with hyperglycemia: Secondary | ICD-10-CM

## 2020-05-14 LAB — FERRITIN: Ferritin: 2541 ng/mL — ABNORMAL HIGH (ref 24–336)

## 2020-05-14 LAB — CBG MONITORING, ED
Glucose-Capillary: 298 mg/dL — ABNORMAL HIGH (ref 70–99)
Glucose-Capillary: 284 mg/dL — ABNORMAL HIGH (ref 70–99)
Glucose-Capillary: 240 mg/dL — ABNORMAL HIGH (ref 70–99)
Glucose-Capillary: 273 mg/dL — ABNORMAL HIGH (ref 70–99)
Glucose-Capillary: 292 mg/dL — ABNORMAL HIGH (ref 70–99)
Glucose-Capillary: 301 mg/dL — ABNORMAL HIGH (ref 70–99)
Glucose-Capillary: 300 mg/dL — ABNORMAL HIGH (ref 70–99)

## 2020-05-14 LAB — COMPREHENSIVE METABOLIC PANEL
ALT: 13 U/L (ref 0–44)
AST: 35 U/L (ref 15–41)
Albumin: 2.7 g/dL — ABNORMAL LOW (ref 3.5–5.0)
Alkaline Phosphatase: 93 U/L (ref 38–126)
Anion gap: 14 (ref 5–15)
BUN: 66 mg/dL — ABNORMAL HIGH (ref 8–23)
CO2: 17 mmol/L — ABNORMAL LOW (ref 22–32)
Calcium: 9.1 mg/dL (ref 8.9–10.3)
Chloride: 99 mmol/L (ref 98–111)
Creatinine, Ser: 3.05 mg/dL — ABNORMAL HIGH (ref 0.61–1.24)
GFR, Estimated: 22 mL/min — ABNORMAL LOW (ref >60)
Glucose, Bld: 251 mg/dL — ABNORMAL HIGH (ref 70–99)
Potassium: 5.2 mmol/L — ABNORMAL HIGH (ref 3.5–5.1)
Sodium: 130 mmol/L — ABNORMAL LOW (ref 135–145)
Total Bilirubin: 1.1 mg/dL (ref 0.3–1.2)
Total Protein: 7.5 g/dL (ref 6.5–8.1)

## 2020-05-14 LAB — C-REACTIVE PROTEIN: CRP: 26.9 mg/dL — ABNORMAL HIGH (ref <1.0)

## 2020-05-14 LAB — PHOSPHORUS: Phosphorus: 4.1 mg/dL (ref 2.5–4.6)

## 2020-05-14 LAB — MAGNESIUM: Magnesium: 2.5 mg/dL — ABNORMAL HIGH (ref 1.7–2.4)

## 2020-05-14 LAB — LACTATE DEHYDROGENASE: LDH: 597 U/L — ABNORMAL HIGH (ref 98–192)

## 2020-05-14 LAB — FIBRINOGEN: Fibrinogen: 745 mg/dL — ABNORMAL HIGH (ref 210–475)

## 2020-05-14 LAB — D-DIMER, QUANTITATIVE: D-Dimer, Quant: >20 ug/mL-FEU — ABNORMAL HIGH (ref 0.00–0.50)

## 2020-05-14 MED ORDER — SODIUM CHLORIDE 0.9 % IV SOLN: INTRAVENOUS | Status: AC

## 2020-05-14 MED ORDER — CARVEDILOL 25 MG PO TABS
25.0000 mg | ORAL_TABLET | Freq: Two times a day (BID) | ORAL | Status: DC
  Administered 2020-05-15 – 2020-05-21 (×11): 25 mg via ORAL
  Filled 2020-05-14 (×14): qty 1

## 2020-05-14 MED ORDER — INSULIN DETEMIR 100 UNIT/ML ~~LOC~~ SOLN
12.0000 [IU] | Freq: Two times a day (BID) | SUBCUTANEOUS | Status: DC
  Administered 2020-05-14 – 2020-05-15 (×2): 12 [IU] via SUBCUTANEOUS
  Filled 2020-05-14 (×3): qty 0.12

## 2020-05-14 MED ORDER — LIP MEDEX EX OINT
TOPICAL_OINTMENT | CUTANEOUS | Status: DC | PRN
  Filled 2020-05-14 (×2): qty 7

## 2020-05-14 MED ORDER — METHADONE HCL 10 MG PO TABS
20.0000 mg | ORAL_TABLET | ORAL | Status: DC
  Administered 2020-05-14 – 2020-05-16 (×2): 20 mg via ORAL
  Filled 2020-05-14 (×2): qty 2

## 2020-05-14 MED ORDER — LORAZEPAM 0.5 MG PO TABS
0.5000 mg | ORAL_TABLET | Freq: Four times a day (QID) | ORAL | Status: DC | PRN
  Administered 2020-05-14: 0.5 mg via ORAL
  Filled 2020-05-14 (×2): qty 1

## 2020-05-14 NOTE — ED Notes (Signed)
Pt transferred from hospital stretcher to hospital bed for comfort. Tolerated well, very weak.

## 2020-05-14 NOTE — ED Notes (Signed)
Per 5w secretary room is not clean

## 2020-05-14 NOTE — Progress Notes (Addendum)
PROGRESS NOTE  [name] ESP:[id number] DOB: [birth year] DOA: 05/30/2020 PCP: Vevelyn Francois, NP  Brief History   65 year old man PMH including atrial fibrillation on anticoagulation with apixaban, nephrectomy, status post kidney transplant no longer on dialysis, diabetes mellitus type 2, CKD stage III AAA, presented with shortness of breath, hypoxia.  Admitted for COVID-19 pneumonia, sepsis, acute hypoxic respiratory failure.  A & P  Severe sepsis secondary to COVID-19 pneumonia with associated acute hypoxic respiratory failure.  Chest x-ray with diffuse pulmonary infiltrates. --Complicated by immune compromise given renal transplant and outpatient medications --Previously not able to tolerate heated high flow.  SPO2 stable on nonrebreather. --Appears ill but no signs or symptoms of impending ventilatory failure --Self prone  Fever: none Oxygen requirements: NRB Antibiotics: none Remdesivir: contraindicated based on renal fxn Steroids: solumedrol immunmodulator: contraindicated sec to immunocompromised state Vitamin C and Zinc: Proning: recommended  Inflammatory markers: CRP: 21 > 26 D-dimer: 8 > greater than 20 LDH: 597 Procalcitonin: .24  AKI on CKD stage IIIb, status post kidney transplant --hold spironolactone --hold tacrolimus, mycophenolate per PCCM --worse today, will give some IVF, consult nephrology  Diabetes mellitus type 2 w/ hyperglycemia --poor control, increase Lantus, continue SSI  Afib --HR stable, continue apixaban, carvedilol, may need to adjust apixaban based on creatinine trend  Chronic diastolic CHF --Appears stable, euvolemic.  Continue carvedilol.  Hold Lasix and spironolactone.  Aortic atherosclerosis --supportive care  Disposition Plan:  Discussion: Critically ill with, pneumonia.  Plan as above.  Currently tolerating nonrebreather.  Status is: Inpatient  Remains inpatient appropriate because:IV treatments appropriate due to intensity of  illness or inability to take PO and Inpatient level of care appropriate due to severity of illness   Dispo: The patient is from: Home              Anticipated d/c is to: Home              Anticipated d/c date is: > 3 days              Patient currently is not medically stable to d/c.  DVT prophylaxis: SCDs Start: 06/08/2020 1718 apixaban (ELIQUIS) tablet 5 mg    Code Status: Full Code Family Communication: none  Murray Hodgkins, MD  [facility] Direct contact: see [email or web address] (further directions at bottom of note if needed) 7PM-7AM contact night coverage as at bottom of note 05/14/2020, 12:37 PM  LOS: 1 day   Significant Hospital Events   . 1/4 admit for COVID pneumonia, AKI   Consults:  . PCCM . Nephrology    Procedures:  .   Significant Diagnostic Tests:  Marland Kitchen    Micro Data:  .    Antimicrobials:  .   Interval History/Subjective  CC: f/u short of breath  Feels SOB, but hungry.  Objective   Vitals:  Vitals:   05/14/20 1100 05/14/20 1114  BP: 130/70 130/70  Pulse: 84   Resp: 18   Temp:    SpO2: 96%     Exam:  Constitutional:   . Appears ill, short of breath ENMT:  . grossly normal hearing  Respiratory:  . CTA bilaterally, no w/r/r.  Marland Kitchen resp effort moderately increased Cardiovascular:  . RRR, no m/r/g . No LE extremity edema   Psychiatric:  . Mental status o Mood, affect appropriate . judgment and insight appear intact   I have personally reviewed the following:   Today's Data  . CBG 200s . K+ 5.2, stable . Cr up to 3.05 .  LFTs stable   Scheduled Meds: . apixaban  5 mg Oral BID  . vitamin C  500 mg Oral Daily  . aspirin EC  81 mg Oral Daily  . carvedilol  25 mg Oral BID WC  . cloNIDine  0.3 mg Oral TID  . insulin aspart  0-9 Units Subcutaneous Q4H  . insulin detemir  12 Units Subcutaneous BID  . Ipratropium-Albuterol  1 puff Inhalation Q6H  . methylPREDNISolone (SOLU-MEDROL) injection  1 mg/kg Intravenous Q12H   Followed by  .  [START ON 05/16/2020] predniSONE  50 mg Oral Daily  . NIFEdipine  90 mg Oral Daily  . pantoprazole  40 mg Oral Daily  . senna-docusate  1 tablet Oral QHS  . zinc sulfate  220 mg Oral Daily   Continuous Infusions: . sodium chloride      Principal Problem:   Pneumonia due to COVID-19 virus Active Problems:   DM (diabetes mellitus), type 2, uncontrolled (HCC)   CKD (chronic kidney disease) stage 3, GFR 30-59 ml/min (HCC)   AKI (acute kidney injury) ([place])   Severe sepsis (HCC)   Acute hypoxemic respiratory failure (HCC)   Atrial fibrillation, chronic ([place])   LOS: 1 day   How to contact the [facility] Attending or Consulting provider 7A - 7P or covering provider during after hours [place], for this patient?  1. Check the care team in [facility] and look for a) attending/consulting TRH provider listed and b) the [facility] team listed 2. Log into [email or web address] and use [place]'s universal password to access. If you do not have the password, please contact the hospital operator. 3. Locate the [facility] provider you are looking for under [facility] and page to a number that you can be directly reached. 4. If you still have difficulty reaching the provider, please page the [facility] (Director on Call) for the Hospitalists listed on amion for assistance.

## 2020-05-14 NOTE — ED Notes (Signed)
combivent inhaler medication requested

## 2020-05-14 NOTE — Progress Notes (Signed)
Inpatient Diabetes Program Recommendations  AACE/ADA: New Consensus Statement on Inpatient Glycemic Control (2015)  Target Ranges:  Prepandial:   less than 140 mg/dL      Peak postprandial:   less than 180 mg/dL (1-2 hours)      Critically ill patients:  140 - 180 mg/dL   Lab Results  Component Value Date   GLUCAP 273 (H) 05/14/2020   HGBA1C 7.0 (A) 01/30/2020    Review of Glycemic Control Results for [name], [name] (MRN [id number]) as of 05/14/2020 12:37  Ref. Range 05/14/2020 01:11 05/14/2020 04:00 05/14/2020 10:03 05/14/2020 11:50  Glucose-Capillary Latest Ref Range: 70 - 99 mg/dL 298 (H) 284 (H) 240 (H) 273 (H)   Diabetes history: DM2 Outpatient Diabetes medications: 70/30 insulin mix 20 units am + 50 units pm Current orders for Inpatient glycemic control: Levemir 9 units bid + Novolog correction 0-9 units q 4 hrs.  Inpatient Diabetes Program Recommendations:   -Increase Levemir to 15 units bid ( 50% home basal dose = approximately 25 units qd.  Add Novolog meal coverage when eating.  Thank you, Nani Gasser. Magda Muise, RN, MSN, CDE  Diabetes Coordinator Inpatient Glycemic Control Team Team Pager [phone] (8am-5pm) 05/14/2020 12:38 PM

## 2020-05-14 NOTE — ED Notes (Signed)
Attempted to give report, this rn to be called back per secreatary

## 2020-05-14 NOTE — ED Notes (Signed)
Respiratory called to assist to prone patient and to place on High Flow oxygen.

## 2020-05-14 NOTE — Progress Notes (Signed)
Cross-coverage note:   Patient seen on follow-up rounds while he is holding in ED waiting for progressive bed. No acute issues. Discussed with RN.

## 2020-05-14 NOTE — Hospital Course (Addendum)
65 year old man PMH including atrial fibrillation on anticoagulation with apixaban, nephrectomy, status post kidney transplant no longer on dialysis, diabetes mellitus type 2, CKD stage III AAA, presented with shortness of breath, hypoxia.  Admitted for COVID-19 pneumonia, sepsis, acute hypoxic respiratory failure.  Treated with steroids with initial clinical improvement, worsened significantly 1/7 with worse chest x-ray, possibly some edema.  Given Actemra.  Seen by critical care.  Oxygenation is stable on nonrebreather, refused lab draws this morning.  Urine output excellent.  Condition guarded, may not survive hospitalization.  A & P  Severe sepsis secondary to COVID-19 pneumonia with associated acute hypoxic respiratory failure.  Chest x-ray with diffuse pulmonary infiltrates. --Complicated by immune compromise given renal transplant and outpatient medications --Significantly worsened 1/7, encephalopathic, currently tolerating nonrebreather, off nasal cannula.  Chest x-ray showed worsening pulmonary infiltrates, possibly edema.  Poorly compliant.  Given Actemra.  Fever: none Oxygen requirements: Nonrebreather Antibiotics: none Remdesivir: contraindicated based on renal fxn Steroids: solumedrol  Immunmodulator: Actemra 1/7 Vitamin C and Zinc: Proning: currently encephalopathic  Inflammatory markers: CRP: 21 > 26 > 16.6 > 9.1 D-dimer: 8 > greater than 20 > same Ferritin 2541 > 3234 > 20 912 Procalcitonin: .24  AKI on CKD stage IIIb, status post kidney transplant, hyperkalemia --Good urine output.  Refusing lab draws this morning.  Continue to hold diuretics.   --Holding tacrolimus, mycophenolate per nephrology until clinical improvement seen.  Diabetes mellitus type 2 w/ hyperglycemia --1 episode of hypoglycemia has been refusing CBG checks.  Long-acting insulin stopped.  Afib --Heart rate remains controlled, will continue apixaban, carvedilol,   Chronic diastolic CHF --No evidence  of volume overload, continue carvedilol.  Hold Lasix and spironolactone.  Aortic atherosclerosis --supportive care

## 2020-05-14 NOTE — ED Notes (Signed)
Pt educated about importance of moving to prone position. Pt agreeable after education. This RN assisted pt to change position to prone, blankets placed to help with comfort. Pt handed call bell.

## 2020-05-14 NOTE — ED Notes (Addendum)
Attempted to give report this rn told floor nurse is in [place] and requesting more time

## 2020-05-14 NOTE — ED Notes (Signed)
RT made aware of pts oxygen saturation, advised to add NRB 15 L to [place] 15 L, 30 L total.
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
## 2020-05-15 ENCOUNTER — Encounter (HOSPITAL_COMMUNITY): Payer: Self-pay | Admitting: Internal Medicine

## 2020-05-15 DIAGNOSIS — I482 Chronic atrial fibrillation, unspecified: Secondary | ICD-10-CM | POA: Diagnosis not present

## 2020-05-15 DIAGNOSIS — U071 COVID-19: Secondary | ICD-10-CM | POA: Diagnosis not present

## 2020-05-15 DIAGNOSIS — J9601 Acute respiratory failure with hypoxia: Secondary | ICD-10-CM | POA: Diagnosis not present

## 2020-05-15 DIAGNOSIS — N179 Acute kidney failure, unspecified: Secondary | ICD-10-CM | POA: Diagnosis not present

## 2020-05-15 LAB — CBC WITH DIFFERENTIAL/PLATELET
Abs Immature Granulocytes: 0.1 10*3/uL — ABNORMAL HIGH (ref 0.00–0.07)
Basophils Absolute: 0 10*3/uL (ref 0.0–0.1)
Basophils Relative: 0 %
Eosinophils Absolute: 0 10*3/uL (ref 0.0–0.5)
Eosinophils Relative: 0 %
HCT: 42 % (ref 39.0–52.0)
Hemoglobin: 14.4 g/dL (ref 13.0–17.0)
Immature Granulocytes: 1 %
Lymphocytes Relative: 3 %
Lymphs Abs: 0.2 10*3/uL — ABNORMAL LOW (ref 0.7–4.0)
MCH: 26.6 pg (ref 26.0–34.0)
MCHC: 34.3 g/dL (ref 30.0–36.0)
MCV: 77.5 fL — ABNORMAL LOW (ref 80.0–100.0)
Monocytes Absolute: 0.2 10*3/uL (ref 0.1–1.0)
Monocytes Relative: 3 %
Neutro Abs: 7.9 10*3/uL — ABNORMAL HIGH (ref 1.7–7.7)
Neutrophils Relative %: 93 %
Platelets: 224 10*3/uL (ref 150–400)
RBC: 5.42 MIL/uL (ref 4.22–5.81)
RDW: 14.5 % (ref 11.5–15.5)
WBC: 8.5 10*3/uL (ref 4.0–10.5)
nRBC: 0.2 % (ref 0.0–0.2)

## 2020-05-15 LAB — COMPREHENSIVE METABOLIC PANEL
ALT: 10 U/L (ref 0–44)
AST: 22 U/L (ref 15–41)
Albumin: 2.4 g/dL — ABNORMAL LOW (ref 3.5–5.0)
Alkaline Phosphatase: 108 U/L (ref 38–126)
Anion gap: 12 (ref 5–15)
BUN: 80 mg/dL — ABNORMAL HIGH (ref 8–23)
CO2: 18 mmol/L — ABNORMAL LOW (ref 22–32)
Calcium: 8.9 mg/dL (ref 8.9–10.3)
Chloride: 100 mmol/L (ref 98–111)
Creatinine, Ser: 2.8 mg/dL — ABNORMAL HIGH (ref 0.61–1.24)
GFR, Estimated: 24 mL/min — ABNORMAL LOW (ref >60)
Glucose, Bld: 349 mg/dL — ABNORMAL HIGH (ref 70–99)
Potassium: 5.4 mmol/L — ABNORMAL HIGH (ref 3.5–5.1)
Sodium: 130 mmol/L — ABNORMAL LOW (ref 135–145)
Total Bilirubin: 0.6 mg/dL (ref 0.3–1.2)
Total Protein: 6.2 g/dL — ABNORMAL LOW (ref 6.5–8.1)

## 2020-05-15 LAB — C-REACTIVE PROTEIN: CRP: 16.6 mg/dL — ABNORMAL HIGH (ref <1.0)

## 2020-05-15 LAB — FERRITIN: Ferritin: 3234 ng/mL — ABNORMAL HIGH (ref 24–336)

## 2020-05-15 LAB — MAGNESIUM: Magnesium: 2.4 mg/dL (ref 1.7–2.4)

## 2020-05-15 LAB — GLUCOSE, CAPILLARY
Glucose-Capillary: 340 mg/dL — ABNORMAL HIGH (ref 70–99)
Glucose-Capillary: 290 mg/dL — ABNORMAL HIGH (ref 70–99)
Glucose-Capillary: 246 mg/dL — ABNORMAL HIGH (ref 70–99)

## 2020-05-15 LAB — PHOSPHORUS: Phosphorus: 4.2 mg/dL (ref 2.5–4.6)

## 2020-05-15 LAB — FIBRINOGEN: Fibrinogen: 471 mg/dL (ref 210–475)

## 2020-05-15 LAB — LACTATE DEHYDROGENASE: LDH: 488 U/L — ABNORMAL HIGH (ref 98–192)

## 2020-05-15 LAB — SEDIMENTATION RATE: Sed Rate: 13 mm/hr (ref 0–16)

## 2020-05-15 LAB — MRSA PCR SCREENING: MRSA by PCR: NEGATIVE

## 2020-05-15 LAB — D-DIMER, QUANTITATIVE: D-Dimer, Quant: >20 ug/mL-FEU — ABNORMAL HIGH (ref 0.00–0.50)

## 2020-05-15 MED ORDER — SODIUM BICARBONATE 650 MG PO TABS
650.0000 mg | ORAL_TABLET | Freq: Two times a day (BID) | ORAL | Status: DC
  Administered 2020-05-15 – 2020-05-17 (×4): 650 mg via ORAL
  Filled 2020-05-15 (×5): qty 1

## 2020-05-15 MED ORDER — INSULIN DETEMIR 100 UNIT/ML ~~LOC~~ SOLN
18.0000 [IU] | Freq: Two times a day (BID) | SUBCUTANEOUS | Status: DC
  Administered 2020-05-15 – 2020-05-16 (×2): 18 [IU] via SUBCUTANEOUS
  Filled 2020-05-15 (×3): qty 0.18

## 2020-05-15 MED ORDER — SODIUM ZIRCONIUM CYCLOSILICATE 10 G PO PACK
10.0000 g | PACK | Freq: Every day | ORAL | Status: DC
  Administered 2020-05-15 – 2020-05-16 (×2): 10 g via ORAL
  Filled 2020-05-15 (×2): qty 1

## 2020-05-15 MED ORDER — INSULIN DETEMIR 100 UNIT/ML ~~LOC~~ SOLN
6.0000 [IU] | Freq: Once | SUBCUTANEOUS | Status: AC
  Administered 2020-05-15: 6 [IU] via SUBCUTANEOUS
  Filled 2020-05-15: qty 0.06

## 2020-05-15 MED ORDER — INSULIN ASPART 100 UNIT/ML ~~LOC~~ SOLN: 3.0000 [IU] | Freq: Three times a day (TID) | SUBCUTANEOUS | Status: DC

## 2020-05-15 MED ORDER — SODIUM CHLORIDE 0.9 % IV SOLN: INTRAVENOUS | Status: DC

## 2020-05-15 MED ORDER — ORAL CARE MOUTH RINSE
15.0000 mL | Freq: Two times a day (BID) | OROMUCOSAL | Status: DC
  Administered 2020-05-15 – 2020-05-21 (×11): 15 mL via OROMUCOSAL

## 2020-05-15 NOTE — Progress Notes (Signed)
PCCM Interval Note  Patient chart reviewed. Patient admitted to 5W. Last documented on FIO2 100% on 15L. Continue current management with oxygen support. PCCM will be available as needed.   Rodman Pickle, M.D. [facility] Pulmonary/Critical Care Medicine 05/15/2020 7:36 AM

## 2020-05-15 NOTE — Progress Notes (Signed)
PROGRESS NOTE  [name] ION:[id number] DOB: Jun 16, 1955 DOA: 05/20/2020 PCP: Vevelyn Francois, NP  Brief History   65 year old man PMH including atrial fibrillation on anticoagulation with apixaban, nephrectomy, status post kidney transplant no longer on dialysis, diabetes mellitus type 2, CKD stage III AAA, presented with shortness of breath, hypoxia.  Admitted for COVID-19 pneumonia, sepsis, acute hypoxic respiratory failure.  A & P  Severe sepsis secondary to COVID-19 pneumonia with associated acute hypoxic respiratory failure.  Chest x-ray with diffuse pulmonary infiltrates. --Complicated by immune compromise given renal transplant and outpatient medications --appears slightly better today, off nonrebreather, seems to be tolerating high flow nasal cannula.  Fever: none Oxygen requirements: High flow nasal cannula Antibiotics: none Remdesivir: contraindicated based on renal fxn Steroids: solumedrol immunmodulator: contraindicated sec to immunocompromised state Vitamin C and Zinc: Proning: recommended  Inflammatory markers: CRP: 21 > 26 > 16.6 D-dimer: 8 > greater than 20 > same Ferritin 2541 > 3234 Procalcitonin: .24  AKI on CKD stage IIIb, status post kidney transplant, hyperkalemia --hold spironolactone --hold tacrolimus, mycophenolate per nephrology, hopefully can restart soon --Creatinine a bit better today.  Continue management per nephrology including bicarb for metabolic acidosis and Lokelma.  Diabetes mellitus type 2 w/ hyperglycemia --Control remains poor.  Will increase Lantus, continue sliding scale insulin and meal coverage  Afib --HR remained stable, will continue apixaban, carvedilol, may need to adjust apixaban based on creatinine trend  Chronic diastolic CHF --Appears euvolemic.  Continue carvedilol.  Hold Lasix and spironolactone.  Aortic atherosclerosis --supportive care  Disposition Plan:  Discussion: Appears a bit better today.  Continue current  therapy as above.  Status is: Inpatient  Remains inpatient appropriate because:IV treatments appropriate due to intensity of illness or inability to take PO and Inpatient level of care appropriate due to severity of illness   Dispo: The patient is from: Home              Anticipated d/c is to: Home              Anticipated d/c date is: > 3 days              Patient currently is not medically stable to d/c.  DVT prophylaxis: SCDs Start: 06/07/2020 1718 apixaban (ELIQUIS) tablet 5 mg    Code Status: Full Code Family Communication: none  Murray Hodgkins, MD  [facility] Direct contact: see [email or web address] (further directions at bottom of note if needed) 7PM-7AM contact night coverage as at bottom of note 05/15/2020, 4:53 PM  LOS: 2 days   Significant Hospital Events   . 1/4 admit for COVID pneumonia, AKI   Consults:  . PCCM . Nephrology    Procedures:  .   Significant Diagnostic Tests:  Marland Kitchen    Micro Data:  .    Antimicrobials:  .   Interval History/Subjective  CC: f/u short of breath  Feels a little better, still SOB.  Objective   Vitals:  Vitals:   05/15/20 1214 05/15/20 1624  BP: 138/75 131/77  Pulse: 77 80  Resp: (!) 21 (!) 21  Temp: (!) 97.4 F (36.3 C) 97.6 F (36.4 C)  SpO2: 93% 96%    Exam:  Constitutional:   . Appears calm, ill, weak, uncomfortable, but nontoxic and a little better today ENMT:  . grossly normal hearing  Respiratory:  . CTA bilaterally, no w/r/r.  . Respiratory effort moderately increased, tachypnic, dyspneic.  Cardiovascular:  . RRR, no m/r/g . No LE extremity edema  Psychiatric:  . Mental status o Mood, affect appropriate  I have personally reviewed the following:   Today's Data  . CBG poorly controlled . Potassium mildly elevated at 5.4, BUN up to 80 but creatinine slightly better at 2.9.  Magnesium and phosphorus within normal limits.  LFTs unremarkable. . CBC unremarkable.  Scheduled Meds: . apixaban  5 mg Oral  BID  . vitamin C  500 mg Oral Daily  . aspirin EC  81 mg Oral Daily  . carvedilol  25 mg Oral BID WC  . cloNIDine  0.3 mg Oral TID  . insulin aspart  0-9 Units Subcutaneous Q4H  . insulin detemir  18 Units Subcutaneous BID  . Ipratropium-Albuterol  1 puff Inhalation Q6H  . mouth rinse  15 mL Mouth Rinse BID  . methadone  20 mg Oral Q M,W,F  . methylPREDNISolone (SOLU-MEDROL) injection  1 mg/kg Intravenous Q12H   Followed by  . [START ON 05/16/2020] predniSONE  50 mg Oral Daily  . NIFEdipine  90 mg Oral Daily  . pantoprazole  40 mg Oral Daily  . senna-docusate  1 tablet Oral QHS  . sodium bicarbonate  650 mg Oral BID  . sodium zirconium cyclosilicate  10 g Oral Daily  . zinc sulfate  220 mg Oral Daily   Continuous Infusions: . sodium chloride 75 mL/hr at 05/15/20 1525    Principal Problem:   Pneumonia due to COVID-19 virus Active Problems:   DM (diabetes mellitus), type 2, uncontrolled (HCC)   CKD (chronic kidney disease) stage 3, GFR 30-59 ml/min (HCC)   AKI (acute kidney injury) ([place])   Severe sepsis (HCC)   Acute hypoxemic respiratory failure (HCC)   Atrial fibrillation, chronic ([place])   LOS: 2 days   How to contact the [facility] Attending or Consulting provider [place] or covering provider during after hours [place], for this patient?  1. Check the care team in [facility] and look for a) attending/consulting TRH provider listed and b) the [facility] team listed 2. Log into [email or web address] and use [place]'s universal password to access. If you do not have the password, please contact the hospital operator. 3. Locate the [facility] provider you are looking for under [facility] and page to a number that you can be directly reached. 4. If you still have difficulty reaching the provider, please page the [facility] (Director on Call) for the Hospitalists listed on amion for assistance.

## 2020-05-15 NOTE — Progress Notes (Signed)
[facility] Progress Note    Assessment/ Plan:   1.  AKI on CKD 3T: likely hemodynamic/ septic mediated in setting of COVID.  Cr 2.10 01/30/20, 2.55 05/19/2020, 3.0 05/14/20 - holding MMF and tac, will eval for adding back tac daily weighing risks/ benefits- will need to likely in the next 24-48 hrs - getting steroids which will provide some IS protection - making some urine- will follow closely - hopefully he will not need dialysis in this setting - holding aldactone and bactrim as well - fluid resuscitating, is on MIVFs, will reorder. - adding bicarb for mild met acidosis     2.  Acute hypoxic RF secondary to COVID-pneumonia - self-proning - NRB and HFNC as well if tolerated - getting steroids  3.  Afib:  - on Eliquis and carvedilol  4.  DMII - per primary  5.  Hyperkalemia: starting lokelma daily  6.  Dispo: pending  Subjective:    Seen in room.  Looks more comfortable.  Able to self- prone overnight.  Eating lunch. Cr getting a little better.     Objective:   BP 138/75 (BP Location: Right Arm)   Pulse 77   Temp (!) 97.4 F (36.3 C) (Oral)   Resp (!) 21   Ht '5\' 11"'  (1.803 m)   Wt 86.4 kg   SpO2 93%   BMI 26.57 kg/m   Intake/Output Summary (Last 24 hours) at 05/15/2020 1450 Last data filed at 05/15/2020 0700 Gross per 24 hour  Intake 866.09 ml  Output 200 ml  Net 666.09 ml   Weight change: -0.359 kg  Physical Exam: GEN NAD, sitting in bed, eating lunch HEENT EOMI PERRL  NECK no JVD PULM tachypenic, some crackles  CV tachycardic ABD soft, nontender, graft nontender EXT no LE edema NEURO AAO x 3     Imaging: Portable chest 1 View  Result Date: 05/14/2020 CLINICAL DATA:  Dyspnea EXAM: PORTABLE CHEST 1 VIEW COMPARISON:  05/26/2020 FINDINGS: Pulmonary insufflation is normal, symmetric, and stable since prior examination. Superimposed diffuse ground-glass pulmonary infiltrate and perihilar interstitial pulmonary edema appears stable in  keeping with moderate pulmonary edema, likely cardiogenic in nature. Cardiac size is mildly enlarged, unchanged. No acute bone abnormality. No pneumothorax or pleural effusion. IMPRESSION: Stable diffuse ground-glass and perihilar interstitial pulmonary infiltrate most in keeping with moderate cardiogenic failure. Electronically Signed   By: Fidela Salisbury MD   On: 05/14/2020 04:53    Labs: BMET Recent Labs  Lab 05/14/2020 1258 05/14/20 0708 05/15/20 0155  NA 128* 130* 130*  K 5.2* 5.2* 5.4*  CL 98 99 100  CO2 17* 17* 18*  GLUCOSE 230* 251* 349*  BUN 48* 66* 80*  CREATININE 2.55* 3.05* 2.80*  CALCIUM 9.0 9.1 8.9  PHOS  --  4.1 4.2   CBC Recent Labs  Lab 05/18/2020 1258 05/15/20 0155  WBC 8.2 8.5  NEUTROABS  --  7.9*  HGB 15.4 14.4  HCT 49.3 42.0  MCV 81.1 77.5*  PLT 268 224    Medications:    . apixaban  5 mg Oral BID  . vitamin C  500 mg Oral Daily  . aspirin EC  81 mg Oral Daily  . carvedilol  25 mg Oral BID WC  . cloNIDine  0.3 mg Oral TID  . insulin aspart  0-9 Units Subcutaneous Q4H  . insulin detemir  18 Units Subcutaneous BID  . Ipratropium-Albuterol  1 puff Inhalation Q6H  . mouth rinse  15 mL Mouth Rinse  BID  . methadone  20 mg Oral Q M,W,F  . methylPREDNISolone (SOLU-MEDROL) injection  1 mg/kg Intravenous Q12H   Followed by  . [START ON 05/16/2020] predniSONE  50 mg Oral Daily  . NIFEdipine  90 mg Oral Daily  . pantoprazole  40 mg Oral Daily  . senna-docusate  1 tablet Oral QHS  . sodium bicarbonate  650 mg Oral BID  . sodium zirconium cyclosilicate  10 g Oral Daily  . zinc sulfate  220 mg Oral Daily      Madelon Lips MD 05/15/2020, 2:50 PM

## 2020-05-15 NOTE — TOC Initial Note (Signed)
Transition of Care [facility]) - Initial/Assessment Note    Patient Details  Name: [name] MRN: [id number] Date of Birth: [birth year]  [facility]) CM/SW Contact:    Verdell Carmine, RN Phone Number: 05/15/2020, 2:29 PM  Clinical Narrative:                 Patient  admitted with pneumonia l and COVID positive.  Long history of atrial fibrillation on eliquis, renal failure,  Is currently on 15L HFlow and 100% NRB. Will likely need home oxygen and home health. CM will follow for needs Expected Discharge Plan: [place] Barriers to Discharge: Continued Medical Work up   Patient Goals and CMS Choice        Expected Discharge Plan and Services Expected Discharge Plan: [place]   Discharge Planning Services: CM Consult                                          Prior Living Arrangements/Services     Patient language and need for interpreter reviewed:: Yes        Need for Family Participation in Patient Care: Yes (Comment)     Criminal Activity/Legal Involvement Pertinent to Current Situation/Hospitalization: No - Comment as needed  Activities of Daily Living Home Assistive Devices/Equipment: Cane (specify quad or straight),CBG Meter,Grab bars around toilet,Grab bars in shower,Nebulizer,Raised toilet seat with rails,Walker (specify type),Wheelchair ADL Screening (condition at time of admission) Patient's cognitive ability adequate to safely complete daily activities?: Yes Is the patient deaf or have difficulty hearing?: No Does the patient have difficulty seeing, even when wearing glasses/contacts?: No Does the patient have difficulty concentrating, remembering, or making decisions?: No Patient able to express need for assistance with ADLs?: Yes Does the patient have difficulty dressing or bathing?: Yes Independently performs ADLs?: No Communication: Independent Dressing (OT): Needs assistance Is this a change from  baseline?: Pre-admission baseline Grooming: Needs assistance Is this a change from baseline?: Pre-admission baseline Feeding: Independent Bathing: Needs assistance Is this a change from baseline?: Pre-admission baseline Toileting: Needs assistance Is this a change from baseline?: Pre-admission baseline In/Out Bed: Needs assistance Is this a change from baseline?: Pre-admission baseline Walks in Home: Needs assistance Is this a change from baseline?: Pre-admission baseline Does the patient have difficulty walking or climbing stairs?: Yes Weakness of Legs: Both Weakness of Arms/Hands: Both  Permission Sought/Granted                  Emotional Assessment       Orientation: : Oriented to  Time,Oriented to Situation,Oriented to Place,Oriented to Self Alcohol / Substance Use: Not Applicable Psych Involvement: No (comment)  Admission diagnosis:  Shortness of breath [R06.02] Renal insufficiency [N28.9] Hypoxia [R09.02] COVID-19 virus infection [U07.1] Pneumonia due to COVID-19 virus [U07.1, J12.82] Patient Active Problem List   Diagnosis Date Noted  . Severe sepsis ([place]) 05/14/2020  . Acute hypoxemic respiratory failure ([place]) 05/14/2020  . Atrial fibrillation, chronic ([place]) 05/14/2020  . Pneumonia due to COVID-19 virus 05/29/2020  . Proteinuria 02/11/2019  . AKI (acute kidney injury) ([place]) 01/29/2019  . Aspiration pneumonia ([place]) 01/29/2019  . Opiate overdose ([place]) 01/29/2019  . Acute respiratory failure with hypoxia ([place]) 05/23/2018  . Acute respiratory failure ([place]) 05/23/2018  . Unintentional methadone overdose ([place]) 05/22/2018  . CHF (congestive heart failure) ([place]) 01/28/2018  . Pulmonary hypertension,  unspecified ([place]) 01/27/2018  . Hepatitis C antibody test positive 01/27/2018  . Acute encephalopathy 01/02/2018  . Lymphedema 10/26/2017  . CHF exacerbation ([place]) 09/23/2017  . CKD (chronic kidney disease) stage 3, GFR 30-59 ml/min (HCC) 09/23/2017  . Acute CHF  (congestive heart failure) ([place]) 09/23/2017  . Chronic venous insufficiency 09/21/2017  . Venous stasis dermatitis of both lower extremities 09/21/2017  . Methadone use 07/30/2017  . Chest pain with moderate risk for cardiac etiology 11/03/2016  . Hyperlipidemia 11/03/2016  . Chronic diastolic heart failure ([place]) 11/03/2016  . Hypokalemia 11/03/2016  . Herpes simplex labialis 08/06/2014  . Mechanical complication of internal orthopedic device ([place]) 07/05/2014  . Chronic coronary artery disease 05/14/2014  . Mitral regurgitation 05/14/2014  . Elevated serum creatinine 11/13/2013  . Atrial flutter ([place]) 05/29/2013  . Status post revision of total replacement of left knee 05/21/2013  . Immunosuppression ([place]) 05/21/2013  . Other complications due to renal dialysis device, implant, and graft 12/22/2012  . Pseudoaneurysm of Left forearm loop AVG 12/22/2012  . Angiopathy 12/22/2012  . IDDM (insulin dependent diabetes mellitus) 01/25/2012  . Uncontrolled hypertension 05/13/2011  . Accelerated hypertension 04/14/2011  . DM (diabetes mellitus), type 2, uncontrolled ([place]) 04/11/2011  . HTN (hypertension) 04/11/2011   PCP:  Vevelyn Francois, NP Pharmacy:   [facility] [place], [place] - [address] [place] [address] Phone: [phone] Fax: [phone]  [place] [place] [place] [address] Phone: [phone] Fax: [phone]     Social Determinants of Health (SDOH) Interventions    Readmission Risk Interventions No flowsheet data found.

## 2020-05-16 ENCOUNTER — Inpatient Hospital Stay (HOSPITAL_COMMUNITY): Payer: Medicare Other

## 2020-05-16 DIAGNOSIS — J1282 Pneumonia due to coronavirus disease 2019: Secondary | ICD-10-CM | POA: Diagnosis not present

## 2020-05-16 DIAGNOSIS — I482 Chronic atrial fibrillation, unspecified: Secondary | ICD-10-CM | POA: Diagnosis not present

## 2020-05-16 DIAGNOSIS — J9601 Acute respiratory failure with hypoxia: Secondary | ICD-10-CM

## 2020-05-16 DIAGNOSIS — U071 COVID-19: Secondary | ICD-10-CM | POA: Diagnosis not present

## 2020-05-16 DIAGNOSIS — N179 Acute kidney failure, unspecified: Secondary | ICD-10-CM | POA: Diagnosis not present

## 2020-05-16 LAB — PHOSPHORUS: Phosphorus: 2.8 mg/dL (ref 2.5–4.6)

## 2020-05-16 LAB — CBC WITH DIFFERENTIAL/PLATELET
Abs Immature Granulocytes: 0.1 10*3/uL — ABNORMAL HIGH (ref 0.00–0.07)
Basophils Absolute: 0 10*3/uL (ref 0.0–0.1)
Basophils Relative: 0 %
Eosinophils Absolute: 0 10*3/uL (ref 0.0–0.5)
Eosinophils Relative: 0 %
HCT: 43.1 % (ref 39.0–52.0)
Hemoglobin: 14.8 g/dL (ref 13.0–17.0)
Immature Granulocytes: 1 %
Lymphocytes Relative: 3 %
Lymphs Abs: 0.3 10*3/uL — ABNORMAL LOW (ref 0.7–4.0)
MCH: 26.1 pg (ref 26.0–34.0)
MCHC: 34.3 g/dL (ref 30.0–36.0)
MCV: 76.1 fL — ABNORMAL LOW (ref 80.0–100.0)
Monocytes Absolute: 0.2 10*3/uL (ref 0.1–1.0)
Monocytes Relative: 2 %
Neutro Abs: 9.7 10*3/uL — ABNORMAL HIGH (ref 1.7–7.7)
Neutrophils Relative %: 94 %
Platelets: 222 10*3/uL (ref 150–400)
RBC: 5.66 MIL/uL (ref 4.22–5.81)
RDW: 14.4 % (ref 11.5–15.5)
WBC: 10.4 10*3/uL (ref 4.0–10.5)
nRBC: 0.5 % — ABNORMAL HIGH (ref 0.0–0.2)

## 2020-05-16 LAB — COMPREHENSIVE METABOLIC PANEL
ALT: 12 U/L (ref 0–44)
AST: 23 U/L (ref 15–41)
Albumin: 2.3 g/dL — ABNORMAL LOW (ref 3.5–5.0)
Alkaline Phosphatase: 108 U/L (ref 38–126)
Anion gap: 11 (ref 5–15)
BUN: 73 mg/dL — ABNORMAL HIGH (ref 8–23)
CO2: 18 mmol/L — ABNORMAL LOW (ref 22–32)
Calcium: 9 mg/dL (ref 8.9–10.3)
Chloride: 106 mmol/L (ref 98–111)
Creatinine, Ser: 2.11 mg/dL — ABNORMAL HIGH (ref 0.61–1.24)
GFR, Estimated: 34 mL/min — ABNORMAL LOW (ref >60)
Glucose, Bld: 162 mg/dL — ABNORMAL HIGH (ref 70–99)
Potassium: 4.5 mmol/L (ref 3.5–5.1)
Sodium: 135 mmol/L (ref 135–145)
Total Bilirubin: 0.9 mg/dL (ref 0.3–1.2)
Total Protein: 6.1 g/dL — ABNORMAL LOW (ref 6.5–8.1)

## 2020-05-16 LAB — LACTATE DEHYDROGENASE: LDH: 501 U/L — ABNORMAL HIGH (ref 98–192)

## 2020-05-16 LAB — SEDIMENTATION RATE: Sed Rate: 11 mm/hr (ref 0–16)

## 2020-05-16 LAB — C-REACTIVE PROTEIN: CRP: 9.1 mg/dL — ABNORMAL HIGH (ref <1.0)

## 2020-05-16 LAB — FIBRINOGEN: Fibrinogen: 415 mg/dL (ref 210–475)

## 2020-05-16 LAB — D-DIMER, QUANTITATIVE: D-Dimer, Quant: >20 ug/mL-FEU — ABNORMAL HIGH (ref 0.00–0.50)

## 2020-05-16 LAB — MAGNESIUM: Magnesium: 2.6 mg/dL — ABNORMAL HIGH (ref 1.7–2.4)

## 2020-05-16 LAB — FERRITIN: Ferritin: 2912 ng/mL — ABNORMAL HIGH (ref 24–336)

## 2020-05-16 MED ORDER — FUROSEMIDE 10 MG/ML IJ SOLN
80.0000 mg | Freq: Once | INTRAMUSCULAR | Status: AC
  Administered 2020-05-16: 80 mg via INTRAVENOUS
  Filled 2020-05-16: qty 8

## 2020-05-16 MED ORDER — SALINE SPRAY 0.65 % NA SOLN
1.0000 | NASAL | Status: DC | PRN
  Filled 2020-05-16: qty 44

## 2020-05-16 MED ORDER — DEXTROSE 50 % IV SOLN
INTRAVENOUS | Status: AC
  Administered 2020-05-16: 25 g via INTRAVENOUS
  Filled 2020-05-16: qty 50

## 2020-05-16 MED ORDER — DEXTROSE 5 % IV SOLN: INTRAVENOUS | Status: AC

## 2020-05-16 MED ORDER — TOCILIZUMAB 400 MG/20ML IV SOLN
8.0000 mg/kg | Freq: Once | INTRAVENOUS | Status: AC
  Administered 2020-05-16: 728 mg via INTRAVENOUS
  Filled 2020-05-16: qty 36.4

## 2020-05-16 MED ORDER — INSULIN DETEMIR 100 UNIT/ML ~~LOC~~ SOLN
24.0000 [IU] | Freq: Two times a day (BID) | SUBCUTANEOUS | Status: DC
  Filled 2020-05-16 (×3): qty 0.24

## 2020-05-16 MED ORDER — METHYLPREDNISOLONE SODIUM SUCC 125 MG IJ SOLR
100.0000 mg | Freq: Two times a day (BID) | INTRAMUSCULAR | Status: DC
  Administered 2020-05-16 – 2020-05-18 (×5): 100 mg via INTRAVENOUS
  Filled 2020-05-16 (×5): qty 2

## 2020-05-16 MED ORDER — DEXTROSE 50 % IV SOLN: 25.0000 g | INTRAVENOUS | Status: AC

## 2020-05-16 NOTE — Progress Notes (Signed)
2115-patient BGM 49. Refusing for staff to poke his finger to check for accuracy. patient refusing to eat and drink. No PRN orders in place. Provider paged.   1 amp of D5 ordered. BGM recheck- 82. D5 MIVF ordered and infusing. Patient stable.

## 2020-05-16 NOTE — Plan of Care (Signed)
  Problem: Education: Goal: Knowledge of General Education information will improve Description: Including pain rating scale, medication(s)/side effects and non-pharmacologic comfort measures Outcome: Not Progressing   Problem: Health Behavior/Discharge Planning: Goal: Ability to manage health-related needs will improve Outcome: Not Progressing   Problem: Respiratory: Goal: Complications related to the disease process, condition or treatment will be avoided or minimized Outcome: Not Progressing

## 2020-05-16 NOTE — Progress Notes (Signed)
[name], MRN:  [id number], DOB:  [birth year], LOS: 3 ADMISSION DATE:  05/19/2020, CONSULTATION DATE:  05/25/2020 REFERRING MD:  Alfredia Ferguson  CHIEF COMPLAINT:  Dyspnea   Brief History   [name] is a 65 y.o. male who was admitted 1/4 with COVID PNA requiring HHFNC.  PCCM asked to see in consultation.  History of present illness   [name] is a 65 y.o. male who has a PMH including but not limited to dCHF, A.fib on Apixaban, HTN, HLD, CKD with nephrectomy s/p transplant, DM, SBO (see "past medical history" for rest).  He presented to [facility] ED 1/4 with dyspnea and cough productive of yellow sputum for 3 days.  EMS was called to his home and on their arrival, he was hypoxic to the 70's on room air.  COVID PCR returned positive (he did receive 2 doses of the Pfizer vaccine on 09/10/19 and 10/01/19).  He was initially placed on NRB but had sats in low to mid 80's.  He was upgraded to [facility] at 45L with improvement in sats to 90's; however, he remained dyspneic.  TRH was called for admission and upon their evaluation, they felt that he was at risk for fatigue; therefore, PCCM was asked to see in consultation.  Past Medical History  has DM (diabetes mellitus), type 2, uncontrolled ([place]); HTN (hypertension); Accelerated hypertension; Uncontrolled hypertension; Other complications due to renal dialysis device, implant, and graft; Pseudoaneurysm of Left forearm loop AVG; Status post revision of total replacement of left knee; Atrial flutter ([place]); Chest pain with moderate risk for cardiac etiology; Hyperlipidemia; Chronic diastolic heart failure ([place]); Hypokalemia; Methadone use; Chronic venous insufficiency; Venous stasis dermatitis of both lower extremities; CHF exacerbation (HCC); CKD (chronic kidney disease) stage 3, GFR 30-59 ml/min ([place]); Acute CHF (congestive heart failure) ([place]); Lymphedema; Acute encephalopathy; Pulmonary hypertension, unspecified ([place]); IDDM (insulin dependent diabetes mellitus);  Hepatitis C antibody test positive; CHF (congestive heart failure) ([place]); Unintentional methadone overdose ([place]); Acute respiratory failure with hypoxia ([place]); Acute respiratory failure ([place]); AKI (acute kidney injury) ([place]); Aspiration pneumonia ([place]); Opiate overdose ([place]); Elevated serum creatinine; Proteinuria; Chronic coronary artery disease; Herpes simplex labialis; Immunosuppression ([place]); Mechanical complication of internal orthopedic device ([place]); Mitral regurgitation; Angiopathy; Pneumonia due to COVID-19 virus; Severe sepsis ([place]); Acute hypoxemic respiratory failure ([place]); and Atrial fibrillation, chronic ([place]) on their problem list.  Significant Hospital Events   1/4 > admit.  Consults:  PCCM.  Procedures:  None  Significant Diagnostic Tests:  CXR 1/4 > patchy bilateral infiltrates.  Micro Data:  Flu 1/4 > negative. COVID 1/4 > positive. Blood 1/4 >  Sputum 1/4 >   Antimicrobials:  None   Interim history/subjective:  Lying in bed in no acute distress RN reports several episodes of hypoxia of unknown time    Objective:  Blood pressure (!) 154/75, pulse 93, temperature 98.2 F (36.8 C), temperature source Axillary, resp. rate (!) 24, height 5\' 11"  (1.803 m), weight 90.9 kg, SpO2 93 %.        Intake/Output Summary (Last 24 hours) at 05/16/2020 1558 Last data filed at 05/16/2020 1300 Gross per 24 hour  Intake 340.88 ml  Output 1850 ml  Net -1509.12 ml   Filed Weights   05/14/20 2300 05/15/20 0355 05/16/20 0348  Weight: 86.4 kg 86.4 kg 90.9 kg    Examination: General: Acute on chronic ill appearing middle aged male lying in bed in NAD HEENT: [place]/AT, MM pink/moist, PERRL,  Neuro: Will respond to verbal stimuli  but appears resistant to follow any commands  CV: s1s2 regular rate and rhythm, no murmur, rubs, or gallops,  PULM:  No acute respiratory distress, no tachypnea, oxygen saturations 90-95% on NRB and HFNC GI: soft, bowel sounds active in all 4 quadrants,  non-tender, non-distended Extremities: warm/dry, no edema  Skin: no rashes or lesions  Assessment & Plan:   COVID-19 PNA Hypoxic respiratory failure  P: Continue current supplemental oxygen requirements Recommend safety sitter for frequent reorientation  Sat goal remains > 85% If mentation worsens or he becomes labored he may require transfer to ICU for further care Continue aggressive pulmonary hygiene  Continue Solumedrol   Rest per primary team  PCCM will continue to follow  Low threshold to move to ICU   Labs   CBC: Recent Labs  Lab 05/24/2020 1258 05/15/20 0155 05/16/20 0200  WBC 8.2 8.5 10.4  NEUTROABS  --  7.9* 9.7*  HGB 15.4 14.4 14.8  HCT 49.3 42.0 43.1  MCV 81.1 77.5* 76.1*  PLT 268 224 962   Basic Metabolic Panel: Recent Labs  Lab 05/10/2020 1258 05/14/20 0708 05/15/20 0155 05/16/20 0200  NA 128* 130* 130* 135  K 5.2* 5.2* 5.4* 4.5  CL 98 99 100 106  CO2 17* 17* 18* 18*  GLUCOSE 230* 251* 349* 162*  BUN 48* 66* 80* 73*  CREATININE 2.55* 3.05* 2.80* 2.11*  CALCIUM 9.0 9.1 8.9 9.0  MG  --  2.5* 2.4 2.6*  PHOS  --  4.1 4.2 2.8   GFR: Estimated Creatinine Clearance: 40.8 mL/min (A) (by C-G formula based on SCr of 2.11 mg/dL (H)). Recent Labs  Lab 05/21/2020 1258 05/15/20 0155 05/16/20 0200  PROCALCITON 0.24  --   --   WBC 8.2 8.5 10.4  LATICACIDVEN 1.5  --   --    Liver Function Tests: Recent Labs  Lab 05/14/20 0708 05/15/20 0155 05/16/20 0200  AST 35 22 23  ALT 13 10 12   ALKPHOS 93 108 108  BILITOT 1.1 0.6 0.9  PROT 7.5 6.2* 6.1*  ALBUMIN 2.7* 2.4* 2.3*   No results for input(s): LIPASE, AMYLASE in the last 168 hours. No results for input(s): AMMONIA in the last 168 hours. ABG    Component Value Date/Time   PHART 7.297 (L) 02/01/2019 2253   PCO2ART 52.8 (H) 02/01/2019 2253   PO2ART 65.6 (L) 02/01/2019 2253   HCO3 25.1 02/01/2019 2253   TCO2 28 01/27/2018 1626   ACIDBASEDEF 0.6 02/01/2019 2253   O2SAT 92.1 02/01/2019 2253     Coagulation Profile: No results for input(s): INR, PROTIME in the last 168 hours. Cardiac Enzymes: No results for input(s): CKTOTAL, CKMB, CKMBINDEX, TROPONINI in the last 168 hours. HbA1C: Hemoglobin A1C  Date/Time Value Ref Range Status  01/30/2020 11:29 AM 7.0 (A) 4.0 - 5.6 % Final  11/01/2019 09:57 AM 6.6 (A) 4.0 - 5.6 % Final   HbA1c, POC (prediabetic range)  Date/Time Value Ref Range Status  11/01/2019 09:57 AM 6.6 (A) 5.7 - 6.4 % Final   HbA1c, POC (controlled diabetic range)  Date/Time Value Ref Range Status  11/01/2019 09:57 AM 6.6 0.0 - 7.0 % Final   HbA1c POC (<> result, manual entry)  Date/Time Value Ref Range Status  11/01/2019 09:57 AM 6.6 4.0 - 5.6 % Final   Hgb A1c MFr Bld  Date/Time Value Ref Range Status  01/29/2019 09:01 PM 6.4 (H) 4.8 - 5.6 % Final    Comment:    (NOTE) Pre diabetes:  5.7%-6.4% Diabetes:              >6.4% Glycemic control for   <7.0% adults with diabetes   05/24/2018 04:09 AM 7.0 (H) 4.8 - 5.6 % Final    Comment:    (NOTE) Pre diabetes:          5.7%-6.4% Diabetes:              >6.4% Glycemic control for   <7.0% adults with diabetes    CBG: Recent Labs  Lab 05/14/20 2019 05/14/20 2207 05/15/20 0743 05/15/20 1219 05/15/20 1726  GLUCAP 301* 300* 340* 290* 246*      Johnsie Cancel, NP-C [facility] Contact / Pager information can be found on Amion  05/16/2020, 4:44 PM

## 2020-05-16 NOTE — Progress Notes (Signed)
Pt had approximately 10 episodes where he pulled off his oxygen and all telemetry leads. Most of the night I kept him on 13 L HFNC and 15 on the NRB due to his desaturation after removal of his oxygen cannula.

## 2020-05-16 NOTE — Progress Notes (Addendum)
PROGRESS NOTE  [name] IRW:[id number] DOB: [birth year] DOA: 05/18/2020 PCP: Vevelyn Francois, NP  Brief History   65 year old man PMH including atrial fibrillation on anticoagulation with apixaban, nephrectomy, status post kidney transplant no longer on dialysis, diabetes mellitus type 2, CKD stage III AAA, presented with shortness of breath, hypoxia.  Admitted for COVID-19 pneumonia, sepsis, acute hypoxic respiratory failure.  Treated with steroids with initial clinical improvement, now worsening 1/7.  A & P  Severe sepsis secondary to COVID-19 pneumonia with associated acute hypoxic respiratory failure.  Chest x-ray with diffuse pulmonary infiltrates. --Complicated by immune compromise given renal transplant and outpatient medications --Clinically significantly worse today, appears encephalopathic.  Some grunting and worsening respiratory distress.  Has been poorly compliant with leaving on nonrebreather.  Oxygenation is stable on high flow and nonrebreather. --Chest x-ray shows worsening infiltrates especially on the left.  Possibly edema.  Fever: none Oxygen requirements: High flow nasal cannula, nonrebreather Antibiotics: none Remdesivir: contraindicated based on renal fxn Steroids: solumedrol increased immunmodulator: Discussed with critical care, nephrology. In my opinion benefit outweighs risk despite chronic immunosuppression. Discussed w/ wife, understands off-label/emergency use and risks involved.  Will give Actemra now. Vitamin C and Zinc: Proning: As tolerated  Inflammatory markers: CRP: 21 > 26 > 16.6 > 9.1 D-dimer: 8 > greater than 20 > same Ferritin 2541 > 3234 > 20 912 Procalcitonin: .24  AKI on CKD stage IIIb, status post kidney transplant, hyperkalemia --AKI improving, creatinine trending down.  Spironolactone on hold.  Spironolactone --Holding tacrolimus, mycophenolate per nephrology until clinical improvement seen. --Creatinine improving.  Slightly  +I/O --Discussed with Dr. Osborne Casco, will give a dose of Lasix 80 mg IV  Diabetes mellitus type 2 w/ hyperglycemia --Remains hyperglycemic.  Will increase Lantus, continue sliding scale.  Afib --Heart rate remained stable, will continue apixaban, carvedilol,   Chronic diastolic CHF --no overt volume overload noted, continue carvedilol.  Hold Lasix and spironolactone.  Aortic atherosclerosis --supportive care  Disposition Plan:  Discussion: Clinically worse today, chest x-ray worse.  Lasix x1.  Concern for ventilatory failure.  On apixaban since prior to admission for atrial fibrillation, no signs or symptoms to suggest VTE.  No fever or leukocytosis.  Do not suspect bacterial infection.  Will ask critical care to reevaluate.  Status is: Inpatient  Remains inpatient appropriate because:IV treatments appropriate due to intensity of illness or inability to take PO and Inpatient level of care appropriate due to severity of illness   Dispo: The patient is from: Home              Anticipated d/c is to: Home              Anticipated d/c date is: > 3 days              Patient currently is not medically stable to d/c.  DVT prophylaxis: SCDs Start: 05/14/2020 1718 apixaban (ELIQUIS) tablet 5 mg    Code Status: Full Code Family Communication: discussed in detail w/ wife Raniss by telephone, updated on critical condition, may not survive hospitalization. Confirmed full code, intubation if offered.   Murray Hodgkins, MD  [facility] Direct contact: see [email or web address] (further directions at bottom of note if needed) 7PM-7AM contact night coverage as at bottom of note 05/16/2020, 5:04 PM  LOS: 3 days   Significant Hospital Events   . 1/4 admit for COVID pneumonia, AKI   Consults:  . PCCM . Nephrology    Procedures:  .   Significant Diagnostic Tests:  .  Micro Data:  .    Antimicrobials:  .   Interval History/Subjective  CC: f/u short of breath  Seems to be worse today.   Confused.  Not able to offer significant history.  Per RN patient has been repeatedly pulling off nonrebreather  Objective   Vitals:  Vitals:   05/16/20 0734 05/16/20 1213  BP: (!) 149/83 (!) 154/75  Pulse: 84 93  Resp: (!) 21 (!) 24  Temp: 98.4 F (36.9 C) 98.2 F (36.8 C)  SpO2: 97% 93%    Exam:  Constitutional:   . Appears critically ill, confused, uncomfortable, ENMT:  . grossly normal hearing  Respiratory:  . CTA bilaterally, no w/r/r.  . Respiratory effort moderately increased.  Has some grunting. Cardiovascular:  . RRR, no m/r/g . No LE extremity edema   . Tree unremarkable Abdomen:  . Soft Musculoskeletal:  . RUE, LUE, RLE, LLE   . Moves all extremities Psychiatric:  . Mental status o Mood, affect not assessable o Oriented to self only . judgment and insight impaired.  Confused. . ABG was attempted, patient would not leave arm still despite multiple attempts, eventually refused blood draw.   I have personally reviewed the following:   Today's Data  . CBG 200-300 . Creatinine improved 2.11 . LFTs unremarkable . LDH without significant change, ferritin and CRP down today, D-dimer without measurable . CBC unremarkable  Scheduled Meds: . apixaban  5 mg Oral BID  . vitamin C  500 mg Oral Daily  . aspirin EC  81 mg Oral Daily  . carvedilol  25 mg Oral BID WC  . cloNIDine  0.3 mg Oral TID  . furosemide  80 mg Intravenous Once  . insulin aspart  0-9 Units Subcutaneous Q4H  . insulin aspart  3 Units Subcutaneous TID WC  . insulin detemir  24 Units Subcutaneous BID  . Ipratropium-Albuterol  1 puff Inhalation Q6H  . mouth rinse  15 mL Mouth Rinse BID  . methadone  20 mg Oral Q M,W,F  . methylPREDNISolone (SOLU-MEDROL) injection  100 mg Intravenous Q12H  . NIFEdipine  90 mg Oral Daily  . pantoprazole  40 mg Oral Daily  . senna-docusate  1 tablet Oral QHS  . sodium bicarbonate  650 mg Oral BID  . zinc sulfate  220 mg Oral Daily   Continuous  Infusions: . tocilizumab (ACTEMRA) - non-COVID treatment      Principal Problem:   Pneumonia due to COVID-19 virus Active Problems:   DM (diabetes mellitus), type 2, uncontrolled (HCC)   CKD (chronic kidney disease) stage 3, GFR 30-59 ml/min (HCC)   AKI (acute kidney injury) ([place])   Severe sepsis (HCC)   Acute hypoxemic respiratory failure (HCC)   Atrial fibrillation, chronic ([place])   LOS: 3 days   How to contact the [facility] Attending or Consulting provider 7A - 7P or covering provider during after hours [place], for this patient?  1. Check the care team in [facility] and look for a) attending/consulting TRH provider listed and b) the [facility] team listed 2. Log into [email or web address] and use [place]'s universal password to access. If you do not have the password, please contact the hospital operator. 3. Locate the [facility] provider you are looking for under [facility] and page to a number that you can be directly reached. 4. If you still have difficulty reaching the provider, please page the [facility] (Director on Call) for the Hospitalists listed on amion for assistance.

## 2020-05-16 NOTE — Progress Notes (Signed)
[facility] Progress Note    Assessment/ Plan:   1.  AKI on CKD 3T: BL 2. likely hemodynamic/ septic mediated in setting of COVID.  Crt improved from 3 -> 2.1 today - holding MMF and tac, was hoping to add back tac today but he looks worse so will hold off and wait for some clinical improvement - getting steroids which will provide some IS protection - making some urine- will follow closely - holding aldactone and bactrim as well - On NS 75cc/hr. Will stop for now - continue oral bicarb 650mg  BID   2.  Acute hypoxic RF secondary to COVID-pneumonia: persistent hypoxia -Continue treatment per primary team -Continue O2 supplementation  3.  Afib:  - on Eliquis and carvedilol  4.  DMII - per primary  5.  Hyperkalemia: K improved. Stop lokelma  6.  Hypertension: BP acceptable on current regimen  Subjective:    Seen today. More agitated and confused. Unable to answer questions. Pulling off his mask.     Objective:   BP (!) 149/83 (BP Location: Right Arm)   Pulse 84   Temp 98.4 F (36.9 C) (Axillary)   Resp (!) 21   Ht 5\' 11"  (1.803 m)   Wt 90.9 kg   SpO2 97%   BMI 27.95 kg/m   Intake/Output Summary (Last 24 hours) at 05/16/2020 1203 Last data filed at 05/15/2020 1923 Gross per 24 hour  Intake 300.88 ml  Output 650 ml  Net -349.12 ml   Weight change: -2.541 kg  Physical Exam: GEN sitting in bed, agitated, removing mask and desaturating HEENT dry mucus membranes, no nasal discharge NECK no JVD PULM tachypenic, mild iwob CV normal rate ABD soft, nontender, graft nontender EXT no LE edema NEURO AAO x 3     Imaging: No results found.  Labs: BMET Recent Labs  Lab 05/26/2020 1258 05/14/20 0708 05/15/20 0155 05/16/20 0200  NA 128* 130* 130* 135  K 5.2* 5.2* 5.4* 4.5  CL 98 99 100 106  CO2 17* 17* 18* 18*  GLUCOSE 230* 251* 349* 162*  BUN 48* 66* 80* 73*  CREATININE 2.55* 3.05* 2.80* 2.11*  CALCIUM 9.0 9.1 8.9 9.0  PHOS  --  4.1 4.2  2.8   CBC Recent Labs  Lab 06/04/2020 1258 05/15/20 0155 05/16/20 0200  WBC 8.2 8.5 10.4  NEUTROABS  --  7.9* 9.7*  HGB 15.4 14.4 14.8  HCT 49.3 42.0 43.1  MCV 81.1 77.5* 76.1*  PLT 268 224 222    Medications:    . apixaban  5 mg Oral BID  . vitamin C  500 mg Oral Daily  . aspirin EC  81 mg Oral Daily  . carvedilol  25 mg Oral BID WC  . cloNIDine  0.3 mg Oral TID  . insulin aspart  0-9 Units Subcutaneous Q4H  . insulin aspart  3 Units Subcutaneous TID WC  . insulin detemir  18 Units Subcutaneous BID  . Ipratropium-Albuterol  1 puff Inhalation Q6H  . mouth rinse  15 mL Mouth Rinse BID  . methadone  20 mg Oral Q M,W,F  . NIFEdipine  90 mg Oral Daily  . pantoprazole  40 mg Oral Daily  . predniSONE  50 mg Oral Daily  . senna-docusate  1 tablet Oral QHS  . sodium bicarbonate  650 mg Oral BID  . sodium zirconium cyclosilicate  10 g Oral Daily  . zinc sulfate  220 mg Oral Daily      Shaune Pollack  Genoveva Singleton  05/16/2020, 12:03 PM

## 2020-05-17 ENCOUNTER — Encounter (HOSPITAL_COMMUNITY): Payer: Self-pay | Admitting: Internal Medicine

## 2020-05-17 DIAGNOSIS — N179 Acute kidney failure, unspecified: Secondary | ICD-10-CM | POA: Diagnosis not present

## 2020-05-17 DIAGNOSIS — U071 COVID-19: Secondary | ICD-10-CM | POA: Diagnosis not present

## 2020-05-17 DIAGNOSIS — J1282 Pneumonia due to coronavirus disease 2019: Secondary | ICD-10-CM | POA: Diagnosis not present

## 2020-05-17 DIAGNOSIS — I482 Chronic atrial fibrillation, unspecified: Secondary | ICD-10-CM | POA: Diagnosis not present

## 2020-05-17 DIAGNOSIS — J9601 Acute respiratory failure with hypoxia: Secondary | ICD-10-CM | POA: Diagnosis not present

## 2020-05-17 LAB — GLUCOSE, CAPILLARY
Glucose-Capillary: 234 mg/dL — ABNORMAL HIGH (ref 70–99)
Glucose-Capillary: 284 mg/dL — ABNORMAL HIGH (ref 70–99)

## 2020-05-17 MED ORDER — DEXMEDETOMIDINE HCL IN NACL 400 MCG/100ML IV SOLN
0.4000 ug/kg/h | INTRAVENOUS | Status: DC
  Administered 2020-05-17: 0.4 ug/kg/h via INTRAVENOUS
  Administered 2020-05-18: 0.6 ug/kg/h via INTRAVENOUS
  Filled 2020-05-17 (×3): qty 100

## 2020-05-17 MED ORDER — CHLORHEXIDINE GLUCONATE CLOTH 2 % EX PADS
6.0000 | MEDICATED_PAD | Freq: Every day | CUTANEOUS | Status: DC
  Administered 2020-05-18 – 2020-05-22 (×5): 6 via TOPICAL

## 2020-05-17 NOTE — Plan of Care (Signed)
Patient is currently resting in bed. VSS. Currently on 15L NRB, refusing to put back on HFNC. Refusing turns. Noncompliant with most of care. Unable to obtain labs this AM. Bed alarm on. Call bell within reach.   Problem: Education: Goal: Knowledge of risk factors and measures for prevention of condition will improve Outcome: Progressing   Problem: Coping: Goal: Psychosocial and spiritual needs will be supported Outcome: Progressing   Problem: Respiratory: Goal: Will maintain a patent airway Outcome: Progressing Goal: Complications related to the disease process, condition or treatment will be avoided or minimized Outcome: Progressing

## 2020-05-17 NOTE — Progress Notes (Addendum)
PROGRESS NOTE  [name] ION:[id number] DOB: Sep 12, 1955 DOA: 05/24/2020 PCP: Vevelyn Francois, NP  Brief History   65 year old man PMH including atrial fibrillation on anticoagulation with apixaban, nephrectomy, status post kidney transplant no longer on dialysis, diabetes mellitus type 2, CKD stage III AAA, presented with shortness of breath, hypoxia.  Admitted for COVID-19 pneumonia, sepsis, acute hypoxic respiratory failure.  Treated with steroids with initial clinical improvement, worsened significantly 1/7 with worse chest x-ray, possibly some edema.  Given Actemra.  Seen by critical care.  Oxygenation is stable on nonrebreather, refused lab draws this morning.  Urine output excellent.  Condition guarded, may not survive hospitalization.  A & P  Severe sepsis secondary to COVID-19 pneumonia with associated acute hypoxic respiratory failure.  Chest x-ray with diffuse pulmonary infiltrates. --Complicated by immune compromise given renal transplant and outpatient medications --Significantly worsened 1/7, encephalopathic, currently tolerating nonrebreather, off nasal cannula.  Chest x-ray showed worsening pulmonary infiltrates, possibly edema.  Poorly compliant.  Given Actemra.  Fever: none Oxygen requirements: Nonrebreather Antibiotics: none Remdesivir: contraindicated based on renal fxn Steroids: solumedrol  Immunmodulator: Actemra 1/7 Vitamin C and Zinc: Proning: currently encephalopathic  Inflammatory markers: CRP: 21 > 26 > 16.6 > 9.1 D-dimer: 8 > greater than 20 > same Ferritin 2541 > 3234 > 20 912 Procalcitonin: .24  AKI on CKD stage IIIb, status post kidney transplant, hyperkalemia --Good urine output.  Refusing lab draws this morning.  Continue to hold diuretics.   --Holding tacrolimus, mycophenolate per nephrology until clinical improvement seen.  Diabetes mellitus type 2 w/ hyperglycemia --1 episode of hypoglycemia has been refusing CBG checks.  Long-acting insulin  stopped.  Afib --Heart rate remains controlled, will continue apixaban, carvedilol,   Chronic diastolic CHF --No evidence of volume overload, continue carvedilol.  Hold Lasix and spironolactone.  Aortic atherosclerosis --supportive care  Disposition Plan:  Discussion: Appears clinically about the same, oxygenation is stable, in fact stable now just on nonrebreather but he remains encephalopathic, does not follow commands although he will state his name.  Afebrile, no signs or symptoms of superinfection.  On maximal medical therapy at this point.  Prognosis remains guarded, may not survive this hospitalization.  ADDENDUM Discussed w/ PCCM, remains encephalopathic and refusing care, agree w/ transfer to ICU under Dr. Erin Fulling. I updated wife and daughter by telephone. Appreciate PCCM care.  Status is: Inpatient  Remains inpatient appropriate because:IV treatments appropriate due to intensity of illness or inability to take PO and Inpatient level of care appropriate due to severity of illness   Dispo: The patient is from: Home              Anticipated d/c is to: TBD              Anticipated d/c date is: > 3 days              Patient currently is not medically stable to d/c.  DVT prophylaxis: SCDs Start: 06/05/2020 1718 apixaban (ELIQUIS) tablet 5 mg    Code Status: Full Code Family Communication:    Murray Hodgkins, MD  [facility] Direct contact: see [email or web address] (further directions at bottom of note if needed) 7PM-7AM contact night coverage as at bottom of note 05/17/2020, 9:59 AM  LOS: 4 days   Significant Hospital Events    1/4 admit for COVID pneumonia, AKI   Consults:   PCCM  Nephrology    Procedures:     Significant Diagnostic Tests:      Micro Data:  Antimicrobials:     Interval History/Subjective  CC: f/u short of breath  Noncompliant overnight, removing mask at times, refusing blood draws.  Unable to offer history, remains  encephalopathic.  Objective   Vitals:  Vitals:   05/17/20 0400 05/17/20 0754  BP: (!) 148/76 (!) 144/75  Pulse: 62 72  Resp: 17 (!) 21  Temp: 98.2 F (36.8 C) 97.9 F (36.6 C)  SpO2: 98% 90%    Exam:  Constitutional:    Appears confused, unable to offer history, does not clearly follow commands.  Speaks in short sentences.  Speech and appropriate ENMT:   grossly normal hearing  Respiratory:   CTA bilaterally, no w/r/r.   Respiratory effort normal. Cardiovascular:   RRR, no m/r/g  No LE extremity edema   Abdomen:   Abdomen appears normal Musculoskeletal:   RUE, LUE, RLE, LLE    strength and tone appear grossly normal but does not follow commands Psychiatric:   Mental status o Mood, affect appropriate  I have personally reviewed the following:   Today's Data   CBG w/o change 200-300 but had an episode of hypoglycemia last night  Refused labs this AM  Scheduled Meds:  apixaban  5 mg Oral BID   vitamin C  500 mg Oral Daily   aspirin EC  81 mg Oral Daily   carvedilol  25 mg Oral BID WC   cloNIDine  0.3 mg Oral TID   insulin aspart  0-9 Units Subcutaneous Q4H   Ipratropium-Albuterol  1 puff Inhalation Q6H   mouth rinse  15 mL Mouth Rinse BID   methylPREDNISolone (SOLU-MEDROL) injection  100 mg Intravenous Q12H   NIFEdipine  90 mg Oral Daily   pantoprazole  40 mg Oral Daily   senna-docusate  1 tablet Oral QHS   sodium bicarbonate  650 mg Oral BID   zinc sulfate  220 mg Oral Daily   Continuous Infusions:   Principal Problem:   Pneumonia due to COVID-19 virus Active Problems:   DM (diabetes mellitus), type 2, uncontrolled (HCC)   CKD (chronic kidney disease) stage 3, GFR 30-59 ml/min (HCC)   AKI (acute kidney injury) ([place])   Severe sepsis (HCC)   Acute hypoxemic respiratory failure (HCC)   Atrial fibrillation, chronic ([place])   LOS: 4 days   How to contact the [facility] Attending or Consulting provider 7A - 7P or covering provider  during after hours 7P -7A, for this patient?  1. Check the care team in [facility] and look for a) attending/consulting TRH provider listed and b) the [facility] team listed 2. Log into [email or web address] and use [place]'s universal password to access. If you do not have the password, please contact the hospital operator. 3. Locate the [facility] provider you are looking for under [facility] and page to a number that you can be directly reached. 4. If you still have difficulty reaching the provider, please page the [facility] (Director on Call) for the Hospitalists listed on amion for assistance.

## 2020-05-17 NOTE — Progress Notes (Signed)
NAMEREGINA COPPOLINO, MRN:  [id number], DOB:  11/12/1955, LOS: 4 ADMISSION DATE:  06/07/2020, CONSULTATION DATE:  05/25/2020 REFERRING MD:  Alfredia Ferguson  CHIEF COMPLAINT:  Dyspnea   Brief History   Angelino NETTIE CROMWELL is a 65 y.o. male who was admitted 1/4 with COVID PNA requiring HHFNC.  PCCM asked to see in consultation.  History of present illness   Teddie MICAJAH DENNIN is a 65 y.o. male who has a PMH including but not limited to dCHF, A.fib on Apixaban, HTN, HLD, CKD with nephrectomy s/p transplant, DM, SBO (see "past medical history" for rest).  He presented to [facility] ED 1/4 with dyspnea and cough productive of yellow sputum for 3 days.  EMS was called to his home and on their arrival, he was hypoxic to the 70's on room air.  COVID PCR returned positive (he did receive 2 doses of the Pfizer vaccine on 09/10/19 and 10/01/19).  He was initially placed on NRB but had sats in low to mid 80's.  He was upgraded to [facility] at 45L with improvement in sats to 90's; however, he remained dyspneic.  TRH was called for admission and upon their evaluation, they felt that he was at risk for fatigue; therefore, PCCM was asked to see in consultation.  Past Medical History  has DM (diabetes mellitus), type 2, uncontrolled ([place]); HTN (hypertension); Accelerated hypertension; Uncontrolled hypertension; Other complications due to renal dialysis device, implant, and graft; Pseudoaneurysm of Left forearm loop AVG; Status post revision of total replacement of left knee; Atrial flutter ([place]); Chest pain with moderate risk for cardiac etiology; Hyperlipidemia; Chronic diastolic heart failure ([place]); Hypokalemia; Methadone use; Chronic venous insufficiency; Venous stasis dermatitis of both lower extremities; CHF exacerbation (HCC); CKD (chronic kidney disease) stage 3, GFR 30-59 ml/min ([place]); Acute CHF (congestive heart failure) ([place]); Lymphedema; Acute encephalopathy; Pulmonary hypertension, unspecified ([place]); IDDM (insulin dependent diabetes mellitus);  Hepatitis C antibody test positive; CHF (congestive heart failure) ([place]); Unintentional methadone overdose ([place]); Acute respiratory failure with hypoxia ([place]); Acute respiratory failure ([place]); AKI (acute kidney injury) ([place]); Aspiration pneumonia ([place]); Opiate overdose ([place]); Elevated serum creatinine; Proteinuria; Chronic coronary artery disease; Herpes simplex labialis; Immunosuppression ([place]); Mechanical complication of internal orthopedic device ([place]); Mitral regurgitation; Angiopathy; Pneumonia due to COVID-19 virus; Severe sepsis ([place]); Acute hypoxemic respiratory failure ([place]); and Atrial fibrillation, chronic ([place]) on their problem list.  Significant Hospital Events   1/4 > admit.  Consults:  PCCM.  Procedures:  None  Significant Diagnostic Tests:  CXR 1/4 > patchy bilateral infiltrates.  Micro Data:  Flu 1/4 > negative. COVID 1/4 > positive. Blood 1/4 >  Sputum 1/4 >   Antimicrobials:  None   Interim history/subjective:  Patient has become more encephalopathic since yesterday per RN and primary team.  He has been taking off his non-breather mask and HFNC multiple times today with saturations falling into the 70s  Patient is unable to tell me how he is feeling   Objective:  Blood pressure (!) 145/70, pulse 78, temperature 98.1 F (36.7 C), temperature source Oral, resp. rate 14, height 5\' 11"  (1.803 m), weight 90.9 kg, SpO2 91 %.        Intake/Output Summary (Last 24 hours) at 05/17/2020 1446 Last data filed at 05/17/2020 0400 Gross per 24 hour  Intake 272.87 ml  Output 2400 ml  Net -2127.13 ml   Filed Weights   05/14/20 2300 05/15/20 0355 05/16/20 0348  Weight: 86.4 kg 86.4 kg 90.9 kg    Examination: General:  ill appearing male, lying in bed HEENT: [place]/AT, MM pink/moist  Neuro: responds to verbal stimuli but only moans, no meaningful responses and is not following commands CV: s1s2 regular rate and rhythm, no murmur, rubs, or gallops,  PULM: diminished air  movement. No wheezing. GI: soft, bowel sounds active in all 4 quadrants, non-tender, non-distended Extremities: warm/dry, no edema  Skin: no rashes or lesions  Assessment & Plan:   Acute Hypoxemic Respiratory failure secondary to Covid 19 Pneumonia - continue HFNC and NRB - Actemra given 1/7 - Continue solumedrol 100mg  BID for now, will plan to taper tomorrow - Remdesivir contraindicated due to renal function - High risk for intubation based on his history of immunosuppression and progressive encephalopathy  Encephalopathy In setting of acute respiratory failure and covid 19, progressively worsening. - Will transfer to ICU to be monitored more closely - Will order precedex for PRN use for agitation in order to keep HFNC and NRB mask in place as he has been taking it off repeatedly - check VBG   AKI on CKD stage IIIb, status post kidney transplant, hyperkalemia -Good urine output.  Refusing lab draws this morning.  Continue to hold diuretics.   -Holding tacrolimus, mycophenolate per nephrology until clinical improvement seen.  Afib -Heart rate remains controlled, will continue apixaban, carvedilol,   Chronic diastolic CHF -No evidence of volume overload, continue carvedilol.  Hold Lasix and spironolactone.  Best practice (evaluated daily)  Diet: NPO, may need coretrack placed Pain/Anxiety/Delirium protocol (if indicated): precedex VAP protocol (if indicated): n/a DVT prophylaxis: eliquis GI prophylaxis: PPI Glucose control: SSI Mobility: bedrest Disposition:ICU  Goals of Care:  Last date of multidisciplinary goals of care discussion: Called wife, Raniss and provided update about the transfer, No goals of care were discussed.  Family and staff present:  Summary of discussion:  Follow up goals of care discussion due: 05/19/09 Code Status: Full  Labs   CBC: Recent Labs  Lab 05/20/2020 1258 05/15/20 0155 05/16/20 0200  WBC 8.2 8.5 10.4  NEUTROABS  --  7.9* 9.7*  HGB  15.4 14.4 14.8  HCT 49.3 42.0 43.1  MCV 81.1 77.5* 76.1*  PLT 268 224 166   Basic Metabolic Panel: Recent Labs  Lab 05/20/2020 1258 05/14/20 0708 05/15/20 0155 05/16/20 0200  NA 128* 130* 130* 135  K 5.2* 5.2* 5.4* 4.5  CL 98 99 100 106  CO2 17* 17* 18* 18*  GLUCOSE 230* 251* 349* 162*  BUN 48* 66* 80* 73*  CREATININE 2.55* 3.05* 2.80* 2.11*  CALCIUM 9.0 9.1 8.9 9.0  MG  --  2.5* 2.4 2.6*  PHOS  --  4.1 4.2 2.8   GFR: Estimated Creatinine Clearance: 40.8 mL/min (A) (by C-G formula based on SCr of 2.11 mg/dL (H)). Recent Labs  Lab 06/05/2020 1258 05/15/20 0155 05/16/20 0200  PROCALCITON 0.24  --   --   WBC 8.2 8.5 10.4  LATICACIDVEN 1.5  --   --    Liver Function Tests: Recent Labs  Lab 05/14/20 0708 05/15/20 0155 05/16/20 0200  AST 35 22 23  ALT 13 10 12   ALKPHOS 93 108 108  BILITOT 1.1 0.6 0.9  PROT 7.5 6.2* 6.1*  ALBUMIN 2.7* 2.4* 2.3*   No results for input(s): LIPASE, AMYLASE in the last 168 hours. No results for input(s): AMMONIA in the last 168 hours. ABG    Component Value Date/Time   PHART 7.297 (L) 02/01/2019 2253   PCO2ART 52.8 (H) 02/01/2019 2253   PO2ART 65.6 (L) 02/01/2019  2253   HCO3 25.1 02/01/2019 2253   TCO2 28 01/27/2018 1626   ACIDBASEDEF 0.6 02/01/2019 2253   O2SAT 92.1 02/01/2019 2253    Coagulation Profile: No results for input(s): INR, PROTIME in the last 168 hours. Cardiac Enzymes: No results for input(s): CKTOTAL, CKMB, CKMBINDEX, TROPONINI in the last 168 hours. HbA1C: Hemoglobin A1C  Date/Time Value Ref Range Status  01/30/2020 11:29 AM 7.0 (A) 4.0 - 5.6 % Final  11/01/2019 09:57 AM 6.6 (A) 4.0 - 5.6 % Final   HbA1c, POC (prediabetic range)  Date/Time Value Ref Range Status  11/01/2019 09:57 AM 6.6 (A) 5.7 - 6.4 % Final   HbA1c, POC (controlled diabetic range)  Date/Time Value Ref Range Status  11/01/2019 09:57 AM 6.6 0.0 - 7.0 % Final   HbA1c POC (<> result, manual entry)  Date/Time Value Ref Range Status   11/01/2019 09:57 AM 6.6 4.0 - 5.6 % Final   Hgb A1c MFr Bld  Date/Time Value Ref Range Status  01/29/2019 09:01 PM 6.4 (H) 4.8 - 5.6 % Final    Comment:    (NOTE) Pre diabetes:          5.7%-6.4% Diabetes:              >6.4% Glycemic control for   <7.0% adults with diabetes   05/24/2018 04:09 AM 7.0 (H) 4.8 - 5.6 % Final    Comment:    (NOTE) Pre diabetes:          5.7%-6.4% Diabetes:              >6.4% Glycemic control for   <7.0% adults with diabetes    CBG: Recent Labs  Lab 05/14/20 2019 05/14/20 2207 05/15/20 0743 05/15/20 1219 05/15/20 1726  GLUCAP 301* 300* 340* 290* 246*   CC time: 45 minutes  Freda Jackson, MD [facility] Office: [phone]

## 2020-05-17 NOTE — Progress Notes (Signed)
[facility] Progress Note Patient Name: BOBIE KISTLER DOB: 04/29/1956 MRN: [id number]   Date of Service  05/17/2020  HPI/Events of Note  New patient evaluation. 81M who is s/p renal transplant (on MMF and tacrolimus as an outpatient, but currently on hold), now here with COVID PNA and acute hypoxemic respiratory failure. He has become more encephalopathic and combative today, refusing lab draws and unable to answer questions. He has been moved to the ICU for escalating FiO2 needs and for Precedex drip for agitation.  On exam, the patient is in mild-to-moderate respiratory distress. He is on [place] high flow + non-rebreather mask saturating 88% on this. HR 90 (SR), BP 140/78 (MAP 96), RR 23/min.  He is encephalopathic and unable to meaningfully answer questions, but he is not obtunded. RN requests wrist restraints due to combative behavior.  Labs notable for non-AG metabolic acidosis with a serum bicarbonate level of 18 from 1.5 days ago (unable to get labs today). K 4.5, Cr 2.1, BUN 73 at that time. WBC 10.4. CXR with findings suggestive of COVID.   eICU Interventions  # Neuro: - Agitation/encephalopathy: Precedex drip at lowest rate required in order to ensure labs can be drawn and patient care carried out.  # Cardiac: - HTN: Continue home antihypertensives. - Afib: Continue Eliquis.  # Respiratory: - Acute hypoxemic respiratory failure 2/2 COVID: Switch to true HFNC (+/- NRB if needed). Receiving methylprednisolone 100mg  IV BID.  # Renal: - AKI on CKD I/s/o renal transplant: Holding tacro/MMF due to COVID infection. On methylprednisolone as above. Holding diuretics and Bactrim ppx. - Metabolic acidosis: From renal dysfunction. On oral bicarbonate 650mg  PO BID. - Repeat BMP and other labs now that he is on Precedex.  DVT PPX: Eliquis as above. GI PPX: Protonix (home medication).      Intervention Category Evaluation Type: New Patient Evaluation  Charlott Rakes 05/17/2020, 9:36 PM

## 2020-05-17 NOTE — Progress Notes (Addendum)
[facility] Progress Note    Assessment/ Plan:   1.  AKI on CKD 3T: BL 2. likely hemodynamic/ septic mediated in setting of COVID.  Crt improved from 3 -> 2.1 yesterday. Labs pending today - holding MMF and tac, was hoping to add back tac today but he looks worse so will hold off and wait for some clinical improvement - getting steroids which will provide some IS protection - UOP good with lasix yesterday - holding aldactone and bactrim as well - Await labs today but if Crt stable could redose lasix today - continue oral bicarb 650mg  BID   2.  Acute hypoxic RF secondary to COVID-pneumonia: persistent hypoxia -Continue treatment per primary team -Continue O2 supplementation -Lasix as above  3.  Afib:  - on Eliquis and carvedilol  4.  DMII - per primary  5.  Hypertension: BP acceptable on current regimen  Subjective:    Due to the COVID pandemic the patient was not seen in person today to limit transmission and utilization of resources.  Continues to be hypoxic. Lasix given yesterday with good UOP. Labs pending today.    Objective:   BP (!) 144/75 (BP Location: Right Arm)   Pulse 72   Temp 97.9 F (36.6 C) (Axillary)   Resp (!) 21   Ht 5\' 11"  (1.803 m)   Wt 90.9 kg   SpO2 90%   BMI 27.95 kg/m   Intake/Output Summary (Last 24 hours) at 05/17/2020 1038 Last data filed at 05/17/2020 0400 Gross per 24 hour  Intake 432.87 ml  Output 3600 ml  Net -3167.13 ml   Weight change:   Physical Exam: Virtual consultation     Imaging: DG CHEST PORT 1 VIEW  Result Date: 05/16/2020 CLINICAL DATA:  Shortness of breath. COVID-19 positive. EXAM: PORTABLE CHEST 1 VIEW COMPARISON:  05/14/2020 FINDINGS: Stable enlarged cardiac silhouette. Increased patchy opacity in both lungs, most pronounced in the left lower lung zone. No visible pleural fluid. Thoracic spine degenerative changes. IMPRESSION: 1. Increased bilateral pneumonia and/or alveolar edema, most  pronounced in the left lower lung zone. 2. Stable cardiomegaly. Electronically Signed   By: Claudie Revering M.D.   On: 05/16/2020 15:32    Labs: BMET Recent Labs  Lab 05/16/2020 1258 05/14/20 0708 05/15/20 0155 05/16/20 0200  NA 128* 130* 130* 135  K 5.2* 5.2* 5.4* 4.5  CL 98 99 100 106  CO2 17* 17* 18* 18*  GLUCOSE 230* 251* 349* 162*  BUN 48* 66* 80* 73*  CREATININE 2.55* 3.05* 2.80* 2.11*  CALCIUM 9.0 9.1 8.9 9.0  PHOS  --  4.1 4.2 2.8   CBC Recent Labs  Lab 05/14/2020 1258 05/15/20 0155 05/16/20 0200  WBC 8.2 8.5 10.4  NEUTROABS  --  7.9* 9.7*  HGB 15.4 14.4 14.8  HCT 49.3 42.0 43.1  MCV 81.1 77.5* 76.1*  PLT 268 224 222    Medications:    . apixaban  5 mg Oral BID  . vitamin C  500 mg Oral Daily  . aspirin EC  81 mg Oral Daily  . carvedilol  25 mg Oral BID WC  . cloNIDine  0.3 mg Oral TID  . insulin aspart  0-9 Units Subcutaneous Q4H  . Ipratropium-Albuterol  1 puff Inhalation Q6H  . mouth rinse  15 mL Mouth Rinse BID  . methylPREDNISolone (SOLU-MEDROL) injection  100 mg Intravenous Q12H  . NIFEdipine  90 mg Oral Daily  . pantoprazole  40 mg Oral Daily  . senna-docusate  1 tablet Oral QHS  . sodium bicarbonate  650 mg Oral BID  . zinc sulfate  220 mg Oral Daily      Reesa Chew  05/17/2020, 10:38 AM

## 2020-05-17 NOTE — Progress Notes (Addendum)
Patient remains oriented only to self and is exhibiting agitation and confusion throughout the day. Has refused labs and insulin injections despite reassurance and education. Was able to have him take PO meds, but will only take a few at a time.  Patient denies pain but consistently moans with incomprehensible speech.  Patient will remove NRB mask at times. Remains on 15L HFNC and 15L NRB mask, saturations in the low 90s.  MD aware of patient's behavior. Will continue to monitor for changes.

## 2020-05-18 ENCOUNTER — Inpatient Hospital Stay (HOSPITAL_COMMUNITY): Payer: Medicare Other

## 2020-05-18 LAB — CBC WITH DIFFERENTIAL/PLATELET
Abs Immature Granulocytes: 0.13 10*3/uL — ABNORMAL HIGH (ref 0.00–0.07)
Basophils Absolute: 0 10*3/uL (ref 0.0–0.1)
Basophils Relative: 0 %
Eosinophils Absolute: 0 10*3/uL (ref 0.0–0.5)
Eosinophils Relative: 0 %
HCT: 48.5 % (ref 39.0–52.0)
Hemoglobin: 15.9 g/dL (ref 13.0–17.0)
Immature Granulocytes: 1 %
Lymphocytes Relative: 9 %
Lymphs Abs: 1.2 10*3/uL (ref 0.7–4.0)
MCH: 26.2 pg (ref 26.0–34.0)
MCHC: 32.8 g/dL (ref 30.0–36.0)
MCV: 79.8 fL — ABNORMAL LOW (ref 80.0–100.0)
Monocytes Absolute: 0.1 10*3/uL (ref 0.1–1.0)
Monocytes Relative: 0 %
Neutro Abs: 12.4 10*3/uL — ABNORMAL HIGH (ref 1.7–7.7)
Neutrophils Relative %: 90 %
Platelets: 228 10*3/uL (ref 150–400)
RBC: 6.08 MIL/uL — ABNORMAL HIGH (ref 4.22–5.81)
RDW: 15.7 % — ABNORMAL HIGH (ref 11.5–15.5)
WBC: 13.8 10*3/uL — ABNORMAL HIGH (ref 4.0–10.5)
nRBC: 0.3 % — ABNORMAL HIGH (ref 0.0–0.2)

## 2020-05-18 LAB — COMPREHENSIVE METABOLIC PANEL
ALT: 18 U/L (ref 0–44)
AST: 35 U/L (ref 15–41)
Albumin: 2.5 g/dL — ABNORMAL LOW (ref 3.5–5.0)
Alkaline Phosphatase: 147 U/L — ABNORMAL HIGH (ref 38–126)
Anion gap: 10 (ref 5–15)
BUN: 61 mg/dL — ABNORMAL HIGH (ref 8–23)
CO2: 23 mmol/L (ref 22–32)
Calcium: 9.8 mg/dL (ref 8.9–10.3)
Chloride: 108 mmol/L (ref 98–111)
Creatinine, Ser: 1.91 mg/dL — ABNORMAL HIGH (ref 0.61–1.24)
GFR, Estimated: 39 mL/min — ABNORMAL LOW (ref >60)
Glucose, Bld: 282 mg/dL — ABNORMAL HIGH (ref 70–99)
Potassium: 5.5 mmol/L — ABNORMAL HIGH (ref 3.5–5.1)
Sodium: 141 mmol/L (ref 135–145)
Total Bilirubin: 1.2 mg/dL (ref 0.3–1.2)
Total Protein: 6.5 g/dL (ref 6.5–8.1)

## 2020-05-18 LAB — BLOOD GAS, VENOUS
Acid-base deficit: 0.3 mmol/L (ref 0.0–2.0)
Bicarbonate: 24.8 mmol/L (ref 20.0–28.0)
Drawn by: 9102
FIO2: 21
O2 Saturation: 32.1 %
Patient temperature: 37.6
pCO2, Ven: 49.2 mmHg (ref 44.0–60.0)
pH, Ven: 7.326 (ref 7.250–7.430)

## 2020-05-18 LAB — PHOSPHORUS: Phosphorus: 3.9 mg/dL (ref 2.5–4.6)

## 2020-05-18 LAB — SEDIMENTATION RATE: Sed Rate: 4 mm/hr (ref 0–16)

## 2020-05-18 LAB — D-DIMER, QUANTITATIVE: D-Dimer, Quant: >20 ug/mL-FEU — ABNORMAL HIGH (ref 0.00–0.50)

## 2020-05-18 LAB — C-REACTIVE PROTEIN: CRP: 8.8 mg/dL — ABNORMAL HIGH (ref <1.0)

## 2020-05-18 LAB — GLUCOSE, CAPILLARY
Glucose-Capillary: 203 mg/dL — ABNORMAL HIGH (ref 70–99)
Glucose-Capillary: 207 mg/dL — ABNORMAL HIGH (ref 70–99)
Glucose-Capillary: 232 mg/dL — ABNORMAL HIGH (ref 70–99)
Glucose-Capillary: 239 mg/dL — ABNORMAL HIGH (ref 70–99)
Glucose-Capillary: 255 mg/dL — ABNORMAL HIGH (ref 70–99)
Glucose-Capillary: 257 mg/dL — ABNORMAL HIGH (ref 70–99)

## 2020-05-18 LAB — FIBRINOGEN: Fibrinogen: 250 mg/dL (ref 210–475)

## 2020-05-18 LAB — CULTURE, BLOOD (SINGLE): Culture: NO GROWTH

## 2020-05-18 LAB — LACTATE DEHYDROGENASE: LDH: 1002 U/L — ABNORMAL HIGH (ref 98–192)

## 2020-05-18 LAB — FERRITIN: Ferritin: 4469 ng/mL — ABNORMAL HIGH (ref 24–336)

## 2020-05-18 LAB — MAGNESIUM: Magnesium: 2.7 mg/dL — ABNORMAL HIGH (ref 1.7–2.4)

## 2020-05-18 MED ORDER — HYDRALAZINE HCL 20 MG/ML IJ SOLN
10.0000 mg | Freq: Four times a day (QID) | INTRAMUSCULAR | Status: DC | PRN
  Administered 2020-05-19 – 2020-05-20 (×3): 10 mg via INTRAVENOUS
  Filled 2020-05-18 (×3): qty 1

## 2020-05-18 MED ORDER — INSULIN GLARGINE 100 UNIT/ML ~~LOC~~ SOLN
10.0000 [IU] | Freq: Every day | SUBCUTANEOUS | Status: DC
  Administered 2020-05-18 – 2020-05-19 (×2): 10 [IU] via SUBCUTANEOUS
  Filled 2020-05-18 (×2): qty 0.1

## 2020-05-18 MED ORDER — DEXAMETHASONE SODIUM PHOSPHATE 10 MG/ML IJ SOLN: 12.0000 mg | Freq: Once | INTRAMUSCULAR | Status: DC

## 2020-05-18 MED ORDER — FUROSEMIDE 10 MG/ML IJ SOLN
80.0000 mg | Freq: Once | INTRAMUSCULAR | Status: AC
  Administered 2020-05-18: 80 mg via INTRAVENOUS
  Filled 2020-05-18: qty 8

## 2020-05-18 MED ORDER — DEXAMETHASONE SODIUM PHOSPHATE 10 MG/ML IJ SOLN
12.0000 mg | Freq: Every day | INTRAMUSCULAR | Status: DC
  Administered 2020-05-19 – 2020-05-20 (×2): 12 mg via INTRAVENOUS
  Filled 2020-05-18 (×2): qty 2

## 2020-05-18 NOTE — Progress Notes (Signed)
[facility] Progress Note    Assessment/ Plan:   1.  AKI on CKD 3T: BL 2. likely hemodynamic/ septic mediated in setting of COVID.  Crt now at baseline - holding MMF and tac, hope to add back tac as soon as possible but will hold off given his deterioration - getting steroids which will provide some IS protection -Urine output and creatinine has been given - holding aldactone and bactrim as well -Redose Lasix 80 mg IV today, can redose as needed -Can stop oral sodium bicarb   2.  Acute hypoxic RF secondary to COVID-pneumonia: Worsening hypoxia over the past 2 days -Continue treatment per primary team -Continue O2 supplementation -Lasix as above  3.  Afib:  - on Eliquis and carvedilol  4.  DMII - per primary  5.  Hypertension: BP fluctuating.  Continue current regimen and use Lasix as above  6.  Hyperkalemia: Likely multifactorial.  Potassium 5.5 today.  Lasix as above.  Could consider Lokelma if needed  Subjective:    Due to the COVID pandemic, in attempts to limit transmission and over-utilization of resources (e.g. PPE), the patient was seen virtually today by means of chart review, discussion with staff, and virtual discussion with patient as needed.   Patient is more encephalopathic and combative. Refused labs yesterday. Crt today 1.9 which is baseline and BUN improving. Now on precedex and HFNC because of hypoxia.    Objective:   BP (!) 165/84   Pulse (!) 53   Temp 98.2 F (36.8 C) (Axillary)   Resp 18   Ht 5\' 11"  (1.803 m)   Wt 90.9 kg   SpO2 94%   BMI 27.95 kg/m   Intake/Output Summary (Last 24 hours) at 05/18/2020 0725 Last data filed at 05/18/2020 0500 Gross per 24 hour  Intake 94.95 ml  Output 1350 ml  Net -1255.05 ml   Weight change:   Physical Exam: Virtual consultation     Imaging: DG CHEST PORT 1 VIEW  Result Date: 05/16/2020 CLINICAL DATA:  Shortness of breath. COVID-19 positive. EXAM: PORTABLE CHEST 1 VIEW COMPARISON:   05/14/2020 FINDINGS: Stable enlarged cardiac silhouette. Increased patchy opacity in both lungs, most pronounced in the left lower lung zone. No visible pleural fluid. Thoracic spine degenerative changes. IMPRESSION: 1. Increased bilateral pneumonia and/or alveolar edema, most pronounced in the left lower lung zone. 2. Stable cardiomegaly. Electronically Signed   By: Claudie Revering M.D.   On: 05/16/2020 15:32    Labs: BMET Recent Labs  Lab 06/05/2020 1258 05/14/20 0708 05/15/20 0155 05/16/20 0200 05/18/20 0244  NA 128* 130* 130* 135 141  K 5.2* 5.2* 5.4* 4.5 5.5*  CL 98 99 100 106 108  CO2 17* 17* 18* 18* 23  GLUCOSE 230* 251* 349* 162* 282*  BUN 48* 66* 80* 73* 61*  CREATININE 2.55* 3.05* 2.80* 2.11* 1.91*  CALCIUM 9.0 9.1 8.9 9.0 9.8  PHOS  --  4.1 4.2 2.8 3.9   CBC Recent Labs  Lab 06/09/2020 1258 05/15/20 0155 05/16/20 0200 05/18/20 0244  WBC 8.2 8.5 10.4 13.8*  NEUTROABS  --  7.9* 9.7* 12.4*  HGB 15.4 14.4 14.8 15.9  HCT 49.3 42.0 43.1 48.5  MCV 81.1 77.5* 76.1* 79.8*  PLT 268 224 222 228    Medications:    . apixaban  5 mg Oral BID  . vitamin C  500 mg Oral Daily  . aspirin EC  81 mg Oral Daily  . carvedilol  25 mg Oral BID WC  .  Chlorhexidine Gluconate Cloth  6 each Topical Daily  . cloNIDine  0.3 mg Oral TID  . furosemide  80 mg Intravenous Once  . insulin aspart  0-9 Units Subcutaneous Q4H  . Ipratropium-Albuterol  1 puff Inhalation Q6H  . mouth rinse  15 mL Mouth Rinse BID  . methylPREDNISolone (SOLU-MEDROL) injection  100 mg Intravenous Q12H  . NIFEdipine  90 mg Oral Daily  . pantoprazole  40 mg Oral Daily  . senna-docusate  1 tablet Oral QHS  . sodium bicarbonate  650 mg Oral BID  . zinc sulfate  220 mg Oral Daily      Reesa Chew  05/18/2020, 7:25 AM

## 2020-05-18 NOTE — Plan of Care (Signed)
  Problem: Elimination: Goal: Will not experience complications related to urinary retention Outcome: Progressing   Problem: Skin Integrity: Goal: Risk for impaired skin integrity will decrease Outcome: Progressing   Problem: Nutrition: Goal: Adequate nutrition will be maintained Outcome: Not Progressing Note: Pt is unable to eat at this time due to increased lethargy.   Problem: Activity: Goal: Risk for activity intolerance will decrease Note: Unable to mobilize due to critical illness.

## 2020-05-18 NOTE — Progress Notes (Signed)
NG tube attempted x2 with no success. Dr. Ayesha Rumpf made aware.

## 2020-05-18 NOTE — Progress Notes (Signed)
[name], MRN:  [id number], DOB:  1955-09-24, LOS: 5 ADMISSION DATE:  05/28/2020, CONSULTATION DATE:  05/12/2020 REFERRING MD:  Alfredia Ferguson  CHIEF COMPLAINT:  Dyspnea   Brief History   [name] is a 65 y.o. male who was admitted 1/4 with COVID PNA requiring HHFNC.  PCCM asked to see in consultation.  History of present illness   [name] is a 65 y.o. male who has a PMH including but not limited to dCHF, A.fib on Apixaban, HTN, HLD, CKD with nephrectomy s/p transplant, DM, SBO (see "past medical history" for rest).  He presented to [facility] ED 1/4 with dyspnea and cough productive of yellow sputum for 3 days.  EMS was called to his home and on their arrival, he was hypoxic to the 70's on room air.  COVID PCR returned positive (he did receive 2 doses of the Pfizer vaccine on 09/10/19 and 10/01/19).  He was initially placed on NRB but had sats in low to mid 80's.  He was upgraded to [facility] at 45L with improvement in sats to 90's; however, he remained dyspneic.  TRH was called for admission and upon their evaluation, they felt that he was at risk for fatigue; therefore, PCCM was asked to see in consultation.  Past Medical History  has DM (diabetes mellitus), type 2, uncontrolled ([place]); HTN (hypertension); Accelerated hypertension; Uncontrolled hypertension; Other complications due to renal dialysis device, implant, and graft; Pseudoaneurysm of Left forearm loop AVG; Status post revision of total replacement of left knee; Atrial flutter ([place]); Chest pain with moderate risk for cardiac etiology; Hyperlipidemia; Chronic diastolic heart failure ([place]); Hypokalemia; Methadone use; Chronic venous insufficiency; Venous stasis dermatitis of both lower extremities; CHF exacerbation (HCC); CKD (chronic kidney disease) stage 3, GFR 30-59 ml/min ([place]); Acute CHF (congestive heart failure) ([place]); Lymphedema; Acute encephalopathy; Pulmonary hypertension, unspecified ([place]); IDDM (insulin dependent diabetes mellitus);  Hepatitis C antibody test positive; CHF (congestive heart failure) ([place]); Unintentional methadone overdose ([place]); Acute respiratory failure with hypoxia ([place]); Acute respiratory failure ([place]); AKI (acute kidney injury) ([place]); Aspiration pneumonia ([place]); Opiate overdose ([place]); Elevated serum creatinine; Proteinuria; Chronic coronary artery disease; Herpes simplex labialis; Immunosuppression ([place]); Mechanical complication of internal orthopedic device ([place]); Mitral regurgitation; Angiopathy; Pneumonia due to COVID-19 virus; Severe sepsis ([place]); Acute hypoxemic respiratory failure ([place]); and Atrial fibrillation, chronic ([place]) on their problem list.  Significant Hospital Events   1/4 > admit.  Consults:  PCCM.  Procedures:  None  Significant Diagnostic Tests:  CXR 1/4 > patchy bilateral infiltrates.  Micro Data:  Flu 1/4 > negative. COVID 1/4 > positive. Blood 1/4 >  Sputum 1/4 >   Antimicrobials:  None   Interim history/subjective:  Transferred to ICU overnight for worsening hypoxemia and mental status changes.  He is delirious and confused.  Moans and groans.  Subjective limited due to condition   Objective:  Blood pressure (!) 158/94, pulse (!) 101, temperature 99.3 F (37.4 C), temperature source Axillary, resp. rate (!) 21, height 5\' 11"  (1.803 m), weight 90.9 kg, SpO2 94 %.    FiO2 (%):  [100 %] 100 %   Intake/Output Summary (Last 24 hours) at 05/18/2020 1722 Last data filed at 05/18/2020 1400 Gross per 24 hour  Intake 157.56 ml  Output 1750 ml  Net -1592.44 ml   Filed Weights   05/14/20 2300 05/15/20 0355 05/16/20 0348  Weight: 86.4 kg 86.4 kg 90.9 kg    Examination: General: Mild distress.  Speaking in short sentences.  HEENT:  [place]/AT, MM pink/moist  Neuro: Opens eyes to voice.  Moans and groans.  Moves all extremities CV: s1s2 regular rate and rhythm, no murmur, rubs, or gallops,  PULM: Mild respiratory distress.  Speaking in short sentences.  Tachypnea. GI: soft.   Nondistended Extremities: warm/dry, no edema  Skin: no rashes or lesions  Assessment & Plan:  1. Acute Hypoxemic Respiratory failure 2. Severe Covid 19 Pneumonia 3.  Acute encephalopathy 4.  Chronic kidney disease stage IIIb, status post renal transplant.  5. Atrial fibrillation   Admitted to ICU for acute hypoxemic respiratory failure due to severe COVID-19 pneumonia. Is currently requiring high flow nasal cannula 40 L 100% FiO2 to maintain SPO2 at 90-92%.  Chest x-ray showed multifocal bilateral opacities.  I suspect hypoxemia is largely related to Covid.  I am less suspicious for superimposed infection, and pulmonary edema.   He is encephalopathic but arousable and opens eyes to stimuli.  I suspect is mostly due to hypoxemia and critical illness. If mental status worsens will obtain CT noncontrast of the head He received Actemra on 1/7.  Continue Decadron 12 mg daily. Remdesivir contraindicated due to renal function.  He has history of renal transplant and was on mycophenolate and tacrolimus.  It is on hold per transplant nephrology recommendations.. For his atrial fibrillation he is on apixaban and Coreg at home.  Nursing team was unable to place NG tube and he is too encephalopathic for p.o. meds. Coretrack will be placed tomorrow.  Last Eliquis dose was yesterday at 9 AM.  If we cannot obtain enteral access then will have to start IV anticoagulation. lantus 10 units plus correctional insulin for hyperglycemia.  Goal 140 to 180 mg/dL.   Best practice (evaluated daily)  Diet: NPO, may need coretrack placed Pain/Anxiety/Delirium protocol (if indicated): precedex as needed for agitationt.  VAP protocol (if indicated): n/a DVT prophylaxis: eliquis GI prophylaxis: PPI Glucose control: SSI Mobility: bedrest Disposition:ICU  Goals of Care:  Last date of multidisciplinary goals of care discussion:about the transfer, No goals of care were discussed.  Family and staff present:  Summary  of discussion:  Follow up goals of care discussion due: 05/19/09 Code Status: Full  Labs   CBC: Recent Labs  Lab 05/26/2020 1258 05/15/20 0155 05/16/20 0200 05/18/20 0244  WBC 8.2 8.5 10.4 13.8*  NEUTROABS  --  7.9* 9.7* 12.4*  HGB 15.4 14.4 14.8 15.9  HCT 49.3 42.0 43.1 48.5  MCV 81.1 77.5* 76.1* 79.8*  PLT 268 224 222 725   Basic Metabolic Panel: Recent Labs  Lab 06/06/2020 1258 05/14/20 0708 05/15/20 0155 05/16/20 0200 05/18/20 0244  NA 128* 130* 130* 135 141  K 5.2* 5.2* 5.4* 4.5 5.5*  CL 98 99 100 106 108  CO2 17* 17* 18* 18* 23  GLUCOSE 230* 251* 349* 162* 282*  BUN 48* 66* 80* 73* 61*  CREATININE 2.55* 3.05* 2.80* 2.11* 1.91*  CALCIUM 9.0 9.1 8.9 9.0 9.8  MG  --  2.5* 2.4 2.6* 2.7*  PHOS  --  4.1 4.2 2.8 3.9   GFR: Estimated Creatinine Clearance: 45 mL/min (A) (by C-G formula based on SCr of 1.91 mg/dL (H)). Recent Labs  Lab 05/25/2020 1258 05/15/20 0155 05/16/20 0200 05/18/20 0244  PROCALCITON 0.24  --   --   --   WBC 8.2 8.5 10.4 13.8*  LATICACIDVEN 1.5  --   --   --    Liver Function Tests: Recent Labs  Lab 05/14/20 0708 05/15/20 0155 05/16/20 0200 05/18/20 0244  AST 35 22 23 35  ALT 13 10 12 18   ALKPHOS 93 108 108 147*  BILITOT 1.1 0.6 0.9 1.2  PROT 7.5 6.2* 6.1* 6.5  ALBUMIN 2.7* 2.4* 2.3* 2.5*   No results for input(s): LIPASE, AMYLASE in the last 168 hours. No results for input(s): AMMONIA in the last 168 hours. ABG    Component Value Date/Time   PHART 7.297 (L) 02/01/2019 2253   PCO2ART 52.8 (H) 02/01/2019 2253   PO2ART 65.6 (L) 02/01/2019 2253   HCO3 24.8 05/18/2020 0256   TCO2 28 01/27/2018 1626   ACIDBASEDEF 0.3 05/18/2020 0256   O2SAT 32.1 05/18/2020 0256    Coagulation Profile: No results for input(s): INR, PROTIME in the last 168 hours. Cardiac Enzymes: No results for input(s): CKTOTAL, CKMB, CKMBINDEX, TROPONINI in the last 168 hours. HbA1C: Hemoglobin A1C  Date/Time Value Ref Range Status  01/30/2020 11:29 AM 7.0 (A)  4.0 - 5.6 % Final  11/01/2019 09:57 AM 6.6 (A) 4.0 - 5.6 % Final   HbA1c, POC (prediabetic range)  Date/Time Value Ref Range Status  11/01/2019 09:57 AM 6.6 (A) 5.7 - 6.4 % Final   HbA1c, POC (controlled diabetic range)  Date/Time Value Ref Range Status  11/01/2019 09:57 AM 6.6 0.0 - 7.0 % Final   HbA1c POC (<> result, manual entry)  Date/Time Value Ref Range Status  11/01/2019 09:57 AM 6.6 4.0 - 5.6 % Final   Hgb A1c MFr Bld  Date/Time Value Ref Range Status  01/29/2019 09:01 PM 6.4 (H) 4.8 - 5.6 % Final    Comment:    (NOTE) Pre diabetes:          5.7%-6.4% Diabetes:              >6.4% Glycemic control for   <7.0% adults with diabetes   05/24/2018 04:09 AM 7.0 (H) 4.8 - 5.6 % Final    Comment:    (NOTE) Pre diabetes:          5.7%-6.4% Diabetes:              >6.4% Glycemic control for   <7.0% adults with diabetes    CBG: Recent Labs  Lab 05/17/20 2317 05/18/20 0405 05/18/20 0758 05/18/20 1139 05/18/20 1607  GLUCAP 284* 257* 232* 239* 255*   CC time: 30 minutes

## 2020-05-19 ENCOUNTER — Inpatient Hospital Stay (HOSPITAL_COMMUNITY): Payer: Medicare Other

## 2020-05-19 DIAGNOSIS — I63532 Cerebral infarction due to unspecified occlusion or stenosis of left posterior cerebral artery: Secondary | ICD-10-CM

## 2020-05-19 DIAGNOSIS — U071 COVID-19: Secondary | ICD-10-CM | POA: Diagnosis not present

## 2020-05-19 DIAGNOSIS — J1282 Pneumonia due to coronavirus disease 2019: Secondary | ICD-10-CM | POA: Diagnosis not present

## 2020-05-19 LAB — COMPREHENSIVE METABOLIC PANEL
ALT: 16 U/L (ref 0–44)
AST: 34 U/L (ref 15–41)
Albumin: 2.5 g/dL — ABNORMAL LOW (ref 3.5–5.0)
Alkaline Phosphatase: 115 U/L (ref 38–126)
Anion gap: 19 — ABNORMAL HIGH (ref 5–15)
BUN: 94 mg/dL — ABNORMAL HIGH (ref 8–23)
CO2: 11 mmol/L — ABNORMAL LOW (ref 22–32)
Calcium: 9.7 mg/dL (ref 8.9–10.3)
Chloride: 116 mmol/L — ABNORMAL HIGH (ref 98–111)
Creatinine, Ser: 2.39 mg/dL — ABNORMAL HIGH (ref 0.61–1.24)
GFR, Estimated: 30 mL/min — ABNORMAL LOW (ref >60)
Glucose, Bld: 203 mg/dL — ABNORMAL HIGH (ref 70–99)
Potassium: 4.6 mmol/L (ref 3.5–5.1)
Sodium: 146 mmol/L — ABNORMAL HIGH (ref 135–145)
Total Bilirubin: 1.5 mg/dL — ABNORMAL HIGH (ref 0.3–1.2)
Total Protein: 6.5 g/dL (ref 6.5–8.1)

## 2020-05-19 LAB — CBC
HCT: 55.2 % — ABNORMAL HIGH (ref 39.0–52.0)
Hemoglobin: 18.6 g/dL — ABNORMAL HIGH (ref 13.0–17.0)
MCH: 26.3 pg (ref 26.0–34.0)
MCHC: 33.7 g/dL (ref 30.0–36.0)
MCV: 78 fL — ABNORMAL LOW (ref 80.0–100.0)
Platelets: 177 10*3/uL (ref 150–400)
RBC: 7.08 MIL/uL — ABNORMAL HIGH (ref 4.22–5.81)
RDW: 17.6 % — ABNORMAL HIGH (ref 11.5–15.5)
WBC: 18 10*3/uL — ABNORMAL HIGH (ref 4.0–10.5)
nRBC: 0.3 % — ABNORMAL HIGH (ref 0.0–0.2)

## 2020-05-19 LAB — CULTURE, BLOOD (ROUTINE X 2)
Culture: NO GROWTH
Special Requests: ADEQUATE

## 2020-05-19 LAB — GLUCOSE, CAPILLARY
Glucose-Capillary: 141 mg/dL — ABNORMAL HIGH (ref 70–99)
Glucose-Capillary: 165 mg/dL — ABNORMAL HIGH (ref 70–99)
Glucose-Capillary: 176 mg/dL — ABNORMAL HIGH (ref 70–99)
Glucose-Capillary: 177 mg/dL — ABNORMAL HIGH (ref 70–99)
Glucose-Capillary: 192 mg/dL — ABNORMAL HIGH (ref 70–99)
Glucose-Capillary: 218 mg/dL — ABNORMAL HIGH (ref 70–99)

## 2020-05-19 LAB — MAGNESIUM: Magnesium: 3.2 mg/dL — ABNORMAL HIGH (ref 1.7–2.4)

## 2020-05-19 LAB — PHOSPHORUS: Phosphorus: 5.5 mg/dL — ABNORMAL HIGH (ref 2.5–4.6)

## 2020-05-19 MED ORDER — LORAZEPAM 2 MG/ML IJ SOLN
1.0000 mg | Freq: Once | INTRAMUSCULAR | Status: AC | PRN
  Administered 2020-05-19: 1 mg via INTRAVENOUS
  Filled 2020-05-19: qty 1

## 2020-05-19 MED ORDER — VITAL 1.5 CAL PO LIQD
1000.0000 mL | ORAL | Status: DC
  Administered 2020-05-19: 1000 mL
  Filled 2020-05-19 (×2): qty 1000

## 2020-05-19 MED ORDER — ASPIRIN EC 325 MG PO TBEC
325.0000 mg | DELAYED_RELEASE_TABLET | Freq: Every day | ORAL | Status: DC
Start: 2020-05-20 — End: 2020-05-21
  Filled 2020-05-19 (×2): qty 1

## 2020-05-19 MED ORDER — CLONIDINE HCL 0.1 MG PO TABS
0.1000 mg | ORAL_TABLET | Freq: Three times a day (TID) | ORAL | Status: DC | PRN
Start: 2020-05-19 — End: 2020-05-21

## 2020-05-19 MED ORDER — INSULIN GLARGINE 100 UNIT/ML ~~LOC~~ SOLN
5.0000 [IU] | Freq: Once | SUBCUTANEOUS | Status: AC
  Administered 2020-05-19: 5 [IU] via SUBCUTANEOUS
  Filled 2020-05-19: qty 0.05

## 2020-05-19 MED ORDER — CLONIDINE HCL 0.1 MG PO TABS: 0.1000 mg | ORAL_TABLET | Freq: Three times a day (TID) | ORAL | Status: DC | PRN

## 2020-05-19 MED ORDER — DEXMEDETOMIDINE HCL IN NACL 400 MCG/100ML IV SOLN
0.4000 ug/kg/h | INTRAVENOUS | Status: DC
  Administered 2020-05-19: 0.4 ug/kg/h via INTRAVENOUS
  Administered 2020-05-19 – 2020-05-20 (×2): 0.8 ug/kg/h via INTRAVENOUS
  Administered 2020-05-20: 0.5 ug/kg/h via INTRAVENOUS
  Administered 2020-05-20: 0.6 ug/kg/h via INTRAVENOUS
  Administered 2020-05-21: 0.4 ug/kg/h via INTRAVENOUS
  Filled 2020-05-19 (×6): qty 100

## 2020-05-19 MED ORDER — INSULIN GLARGINE 100 UNIT/ML ~~LOC~~ SOLN
15.0000 [IU] | Freq: Every day | SUBCUTANEOUS | Status: DC
  Filled 2020-05-19: qty 0.15

## 2020-05-19 MED ORDER — MIDAZOLAM HCL 2 MG/2ML IJ SOLN
1.0000 mg | Freq: Once | INTRAMUSCULAR | Status: AC
  Administered 2020-05-19: 1 mg via INTRAVENOUS
  Filled 2020-05-19: qty 2

## 2020-05-19 MED ORDER — PROSOURCE TF PO LIQD
45.0000 mL | Freq: Two times a day (BID) | ORAL | Status: DC
  Administered 2020-05-19 – 2020-05-21 (×6): 45 mL
  Filled 2020-05-19 (×6): qty 45

## 2020-05-19 MED ORDER — FREE WATER
200.0000 mL | Freq: Four times a day (QID) | Status: DC
  Administered 2020-05-19 – 2020-05-21 (×8): 200 mL

## 2020-05-19 NOTE — Progress Notes (Addendum)
NAMECARMON [name], MRN:  [id number], DOB:  1956-01-28, LOS: 6 ADMISSION DATE:  06/07/2020, CONSULTATION DATE:  05/26/2020 REFERRING MD:  Alfredia Ferguson  CHIEF COMPLAINT:  Dyspnea   Brief History   [name] is a 65 y.o. male who was admitted 1/4 with COVID PNA requiring HHFNC.  PCCM asked to see in consultation.  History of present illness   [name] is a 65 y.o. male who has a PMH including but not limited to dCHF, A.fib on Apixaban, HTN, HLD, CKD with nephrectomy s/p transplant, DM, SBO (see "past medical history" for rest).  He presented to [facility] ED 1/4 with dyspnea and cough productive of yellow sputum for 3 days.  EMS was called to his home and on their arrival, he was hypoxic to the 70's on room air.  COVID PCR returned positive (he did receive 2 doses of the Pfizer vaccine on 09/10/19 and 10/01/19).  He was initially placed on NRB but had sats in low to mid 80's.  He was upgraded to [facility] at 45L with improvement in sats to 90's; however, he remained dyspneic.  TRH was called for admission and upon their evaluation, they felt that he was at risk for fatigue; therefore, PCCM was asked to see in consultation.  Past Medical History  has DM (diabetes mellitus), type 2, uncontrolled ([place]); HTN (hypertension); Accelerated hypertension; Uncontrolled hypertension; Other complications due to renal dialysis device, implant, and graft; Pseudoaneurysm of Left forearm loop AVG; Status post revision of total replacement of left knee; Atrial flutter ([place]); Chest pain with moderate risk for cardiac etiology; Hyperlipidemia; Chronic diastolic heart failure ([place]); Hypokalemia; Methadone use; Chronic venous insufficiency; Venous stasis dermatitis of both lower extremities; CHF exacerbation (HCC); CKD (chronic kidney disease) stage 3, GFR 30-59 ml/min ([place]); Acute CHF (congestive heart failure) ([place]); Lymphedema; Acute encephalopathy; Pulmonary hypertension, unspecified ([place]); IDDM (insulin dependent diabetes mellitus);  Hepatitis C antibody test positive; CHF (congestive heart failure) ([place]); Unintentional methadone overdose ([place]); Acute respiratory failure with hypoxia ([place]); Acute respiratory failure ([place]); AKI (acute kidney injury) ([place]); Aspiration pneumonia ([place]); Opiate overdose ([place]); Elevated serum creatinine; Proteinuria; Chronic coronary artery disease; Herpes simplex labialis; Immunosuppression ([place]); Mechanical complication of internal orthopedic device ([place]); Mitral regurgitation; Angiopathy; Pneumonia due to COVID-19 virus; Severe sepsis ([place]); Acute hypoxemic respiratory failure ([place]); and Atrial fibrillation, chronic ([place]) on their problem list.  Significant Hospital Events   1/4 > admit  Consults:  PCCM  Procedures:  None  Significant Diagnostic Tests:  CXR 1/4 > patchy bilateral infiltrates.  Micro Data:  Flu 1/4 > negative. COVID 1/4 > positive. Blood 1/4 >  Sputum 1/4 >   Antimicrobials:  None   Interim history/subjective:  No acute events overnight, continues to have confusion and delirium.  Does have agitation occasionally. Unable to place NGT.   Patient is not following commands or answering questions. Remains encephalopathic and confused. Subjective limited due to condition   Objective:  Blood pressure (!) 193/107, pulse (!) 109, temperature 97.8 F (36.6 C), temperature source Oral, resp. rate 19, height 5\' 11"  (1.803 m), weight 90.9 kg, SpO2 98 %.    FiO2 (%):  [100 %] 100 %   Intake/Output Summary (Last 24 hours) at 05/19/2020 0908 Last data filed at 05/19/2020 0818 Gross per 24 hour  Intake 11.63 ml  Output 2300 ml  Net -2288.37 ml   Filed Weights   05/14/20 2300 05/15/20 0355 05/16/20 0348  Weight: 86.4 kg 86.4 kg 90.9 kg    Examination:  General: Encephalopathic, appears in mild distress. HEENT: [place]/AT, MM pink/moist  Neuro: Patient not opening eyes or responding to voice.  Not following commands.  Appears encephalopathic CV: Tachycardic, irregular rate  and rhythm, no murmurs rubs or gallops  PULM: Mild respiratory distress, tachypnea, decreased breath sounds throughout  GI: Soft, nondistended, normoactive bowel sounds Extremities: warm/dry, no lower extremity edema  Skin: no rashes or lesions  Assessment & Plan:  1. Acute Hypoxemic Respiratory failure 2. Severe Covid 19 Pneumonia Currently on high flow nasal cannula 50 L and 100% FiO2 to maintain SPO2 90-92%. Chest x-ray showed multifocal bilateral opacities.  Patient appears tachypneic on exam and is encephalopathic.  Patient is at risk for need for intubation given his tachypnea and respiratory distress.   -Continue Decadron -Received Actemra 1/7 -Unable to give remdesivir secondary to renal function -Continue high flow nasal cannula, 50 L 100% FiO2  3.  Acute encephalopathy Could be secondary to severe Covid pneumonia and hypoxemia.  However her mental status appears to be worsening from yesterday, does not follow commands or open eyes to voice today. Kidney function stable yesterday, awaiting labs today.  -Continue treatment for Covid pneumonia as above -CT head -Follow-up CMP -Placing Coretrack for p.o. medications  4.  Chronic kidney disease stage IIIb, status post renal transplant.  Kidney function was stable yesterday.  Holding mycophenolate and tacrolimus per transplant nephrology recommendations. -S/p lasix -Nephrology following, appreciate recommendations -Monitor kidney function  5. Atrial fibrillation Patient in atrial fibrillation today, last Eliquis dose was on 1/7. Is also on Coreg at home -Place core track today and restart home Eliquis and Coreg  6.  Hyperglycemia: -Continue Lantus 10 units plus SSI -Increase lantus -CBGs ranging around 192-255.  -Goal CBG 140-180  7. HTN: Labile blood pressures, up to 193/107. Coreg, clonidine, hydralazine, nifedipine ordered however patient was unable to tolerate PO intake yesterday. Receiving lasix for diuresis.   -Hydralazine 10 mg PRN -Clonidine 0.3 mg TID -Coreg 25 mg BID  Best practice (evaluated daily)  Diet: NPO, place coretrack  Pain/Anxiety/Delirium protocol (if indicated): precedex as needed for agitation.  VAP protocol (if indicated): n/a DVT prophylaxis: eliquis GI prophylaxis: PPI Glucose control: SSI Mobility: bedrest Disposition: ICU  Goals of Care:  Last date of multidisciplinary goals of care discussion: Updated wife via phone on 1/10, discussed worsening respiratory status. No goals of care were discussed.  Family and staff present:  Summary of discussion:  Follow up goals of care discussion due: 05/19/09 Code Status: Full  Labs   CBC: Recent Labs  Lab 06/02/2020 1258 05/15/20 0155 05/16/20 0200 05/18/20 0244  WBC 8.2 8.5 10.4 13.8*  NEUTROABS  --  7.9* 9.7* 12.4*  HGB 15.4 14.4 14.8 15.9  HCT 49.3 42.0 43.1 48.5  MCV 81.1 77.5* 76.1* 79.8*  PLT 268 224 222 502   Basic Metabolic Panel: Recent Labs  Lab 06/05/2020 1258 05/14/20 0708 05/15/20 0155 05/16/20 0200 05/18/20 0244  NA 128* 130* 130* 135 141  K 5.2* 5.2* 5.4* 4.5 5.5*  CL 98 99 100 106 108  CO2 17* 17* 18* 18* 23  GLUCOSE 230* 251* 349* 162* 282*  BUN 48* 66* 80* 73* 61*  CREATININE 2.55* 3.05* 2.80* 2.11* 1.91*  CALCIUM 9.0 9.1 8.9 9.0 9.8  MG  --  2.5* 2.4 2.6* 2.7*  PHOS  --  4.1 4.2 2.8 3.9   GFR: Estimated Creatinine Clearance: 45 mL/min (A) (by C-G formula based on SCr of 1.91 mg/dL (H)). Recent Labs  Lab 05/28/2020  1258 05/15/20 0155 05/16/20 0200 05/18/20 0244  PROCALCITON 0.24  --   --   --   WBC 8.2 8.5 10.4 13.8*  LATICACIDVEN 1.5  --   --   --    Liver Function Tests: Recent Labs  Lab 05/14/20 0708 05/15/20 0155 05/16/20 0200 05/18/20 0244  AST 35 22 23 35  ALT 13 10 12 18   ALKPHOS 93 108 108 147*  BILITOT 1.1 0.6 0.9 1.2  PROT 7.5 6.2* 6.1* 6.5  ALBUMIN 2.7* 2.4* 2.3* 2.5*   No results for input(s): LIPASE, AMYLASE in the last 168 hours. No results for input(s):  AMMONIA in the last 168 hours. ABG    Component Value Date/Time   PHART 7.297 (L) 02/01/2019 2253   PCO2ART 52.8 (H) 02/01/2019 2253   PO2ART 65.6 (L) 02/01/2019 2253   HCO3 24.8 05/18/2020 0256   TCO2 28 01/27/2018 1626   ACIDBASEDEF 0.3 05/18/2020 0256   O2SAT 32.1 05/18/2020 0256    Coagulation Profile: No results for input(s): INR, PROTIME in the last 168 hours. Cardiac Enzymes: No results for input(s): CKTOTAL, CKMB, CKMBINDEX, TROPONINI in the last 168 hours. HbA1C: Hemoglobin A1C  Date/Time Value Ref Range Status  01/30/2020 11:29 AM 7.0 (A) 4.0 - 5.6 % Final  11/01/2019 09:57 AM 6.6 (A) 4.0 - 5.6 % Final   HbA1c, POC (prediabetic range)  Date/Time Value Ref Range Status  11/01/2019 09:57 AM 6.6 (A) 5.7 - 6.4 % Final   HbA1c, POC (controlled diabetic range)  Date/Time Value Ref Range Status  11/01/2019 09:57 AM 6.6 0.0 - 7.0 % Final   HbA1c POC (<> result, manual entry)  Date/Time Value Ref Range Status  11/01/2019 09:57 AM 6.6 4.0 - 5.6 % Final   Hgb A1c MFr Bld  Date/Time Value Ref Range Status  01/29/2019 09:01 PM 6.4 (H) 4.8 - 5.6 % Final    Comment:    (NOTE) Pre diabetes:          5.7%-6.4% Diabetes:              >6.4% Glycemic control for   <7.0% adults with diabetes   05/24/2018 04:09 AM 7.0 (H) 4.8 - 5.6 % Final    Comment:    (NOTE) Pre diabetes:          5.7%-6.4% Diabetes:              >6.4% Glycemic control for   <7.0% adults with diabetes    CBG: Recent Labs  Lab 05/18/20 1607 05/18/20 1937 05/18/20 2351 05/19/20 0411 05/19/20 0803  GLUCAP 255* 203* 207* [place], M.D. PGY3 Pager [phone] 05/19/2020 9:22 AM

## 2020-05-19 NOTE — Progress Notes (Signed)
On initial assessment patient RASS -5, pupils reactive 25mm bilaterally. Does not move extremities to pain but does close mouth to pain. Notified Dr. Curly [place], stat HCT ordered. Called for transport.

## 2020-05-19 NOTE — Progress Notes (Signed)
[facility] Progress Note Patient Name: [name] DOB: 1955-12-04 MRN: [id number]   Date of Service  05/19/2020  HPI/Events of Note  Hypertension - BP = 115/69. Nursing request for parameters on Catapres dose.   eICU Interventions  Plan: 1. Change Catapres to 0.1 mg PO Q 8 hours PRN SBP > 160 or DBP > 100     Intervention Category Major Interventions: Hypertension - evaluation and management  Justino Boze [place] 05/19/2020, 10:26 PM

## 2020-05-19 NOTE — Progress Notes (Signed)
Initial Nutrition Assessment  DOCUMENTATION CODES:   Not applicable  INTERVENTION:   Tube Feeding via Cortrak: Vital 1.5 at 60 ml/hr Begin TF at 20 ml/hr; titrate by 10 mL q 8 hours until goal rate of 60 ml/hr Pro-Source TF 45 mL BID Provides 119 g of protein, 2240 kcals and 1094 mL of free water  Add free water 200 mL q 6 hours   NUTRITION DIAGNOSIS:   Inadequate oral intake related to acute illness,lethargy/confusion as evidenced by NPO status,meal completion < 25%.  GOAL:   Patient will meet greater than or equal to 90% of their needs  MONITOR:   Labs,TF tolerance,Weight trends,PO intake  REASON FOR ASSESSMENT:   Rounds    ASSESSMENT:   65 yo admitted with acute respiratory failure secondary to COVID-19 pneumonia, acute encephalopathy. PMH includes CHF, HTN, HLD, CKD, nephrectomy s/p transplant, DM  1/04 Admitted 1/10 Cortrak placed  Pt not following commands, confused NPO, Cortrak placed today  Recorded po intake 0-25%, no recorded intake since 1/7  Constipated, no BM since admission. Abd xray with large amount of stool  Current wt 90.9 kg. Admit wt 93.8 kg  Unable to obtain diet and weight history from patient at this time  Labs: reviewed Meds:decadron, ss novolog, lantus, precedex gtt, miralax, senokot    Diet Order:   Diet Order    None      EDUCATION NEEDS:   Not appropriate for education at this time  Skin:  Skin Assessment: Reviewed RN Assessment  Last BM:  PTA  Height:   Ht Readings from Last 1 Encounters:  05/14/20 5\' 11"  (1.803 m)    Weight:   Wt Readings from Last 1 Encounters:  05/16/20 90.9 kg    BMI:  Body mass index is 27.95 kg/m.  Estimated Nutritional Needs:   Kcal:  3299-2426 kcals  Protein:  110-130 g  Fluid:  >/= 2L    Kerman Passey MS, RDN, LDN, CNSC Registered Dietitian III Clinical Nutrition RD Pager and On-Call Pager Number Located in [place]

## 2020-05-19 NOTE — Procedures (Addendum)
Cortrak  Person Inserting Tube:  Rosezetta Schlatter, RD Tube Type:  Cortrak - 43 inches Tube Location:  Right nare Initial Placement:  Stomach Secured by: Bridle Technique Used to Measure Tube Placement:  Documented cm marking at nare/ corner of mouth Cortrak Secured At:  70 cm Procedure Comments:  Cortrak Tube Team Note:  Consult received to place a Cortrak feeding tube.   No x-ray is required. RN may begin using tube.   If the tube becomes dislodged please keep the tube and contact the Cortrak team at [email or web address] ([email or web address]) for replacement.  If after hours and replacement cannot be delayed, place a NG tube and confirm placement with an abdominal x-ray.       Jarome Matin, MS, RD, LDN, CNSC Inpatient Clinical Dietitian RD pager # available in [place]  After hours/weekend pager # available in [facility]

## 2020-05-19 NOTE — Progress Notes (Signed)
[facility] Progress Note    Assessment/ Plan:   1.  AKI on CKD 3T, nonoliguric: BL 2. likely hemodynamic/ septic mediated in setting of COVID.  Crt now at baseline as of 1/9 - holding MMF and tac, hope to add back tac as soon as possible but will hold off given his deterioration - getting steroids which will provide some immunosuppression -Urine output and creatinine has been given - holding aldactone and bactrim as well   2.  Acute hypoxic RF secondary to COVID-pneumonia: Worsening hypoxia over the past 2 days -Continue treatment per primary team -Continue O2 supplementation -Lasix as above  3.  Afib:  - on Eliquis and carvedilol  4.  DMII - per primary  5.  Hypertension: BP fluctuating, worsened due to inability to take PO meds, coretrack in place now.  Continue current regimen and use Lasix as above  6.  Hyperkalemia: Likely multifactorial.  No labs today. Can use lokelma  Subjective:    Due to the COVID pandemic, in attempts to limit transmission and over-utilization of resources (e.g. PPE), the patient was seen virtually today by means of chart review, discussion with staff, and virtual discussion with patient as needed.   No acute events, continues to have confusion and agitation. uop 1.7L. no labs yet today   Objective:   BP (!) 161/98   Pulse (!) 109   Temp 98.1 F (36.7 C) (Axillary)   Resp 19   Ht 5\' 11"  (1.803 m)   Wt 90.9 kg   SpO2 98%   BMI 27.95 kg/m   Intake/Output Summary (Last 24 hours) at 05/19/2020 1336 Last data filed at 05/19/2020 0818 Gross per 24 hour  Intake --  Output 1600 ml  Net -1600 ml   Weight change:   Physical Exam: Virtual consultation     Imaging: DG Abd 1 View  Result Date: 05/18/2020 CLINICAL DATA:  65 year old male with abdominal distension. EXAM: ABDOMEN - 1 VIEW COMPARISON:  Abdominal radiograph dated 02/02/2019. FINDINGS: Postsurgical changes of bowel with anastomotic suture in the left lower  abdomen. There is large amount of stool throughout the colon. No bowel dilatation or evidence of obstruction. No free air or radiopaque calculi. The osseous structures are intact. Partially visualized bilateral pulmonary opacities. IMPRESSION: 1. Constipation. No bowel obstruction. 2. Bilateral pulmonary opacities. Electronically Signed   By: Anner Crete M.D.   On: 05/18/2020 18:31   DG CHEST PORT 1 VIEW  Result Date: 05/18/2020 CLINICAL DATA:  Shortness of breath. EXAM: PORTABLE CHEST 1 VIEW COMPARISON:  Most recent radiograph 2 days ago 05/16/2020. FINDINGS: Similar cardiomegaly. Unchanged mediastinal contours with aortic atherosclerosis. Diffuse fine reticular and interstitial opacities, with slight improvement at the left lung base from prior. No definite progression. No pneumothorax or visualized pleural effusion. No evidence of pneumomediastinum. Stable osseous structures. IMPRESSION: Diffuse fine reticular and interstitial opacities, with slight improvement at the left lung base from prior, edema versus multifocal infection. Cardiomegaly is stable. Electronically Signed   By: Keith Rake M.D.   On: 05/18/2020 18:33    Labs: BMET Recent Labs  Lab 05/24/2020 1258 05/14/20 0708 05/15/20 0155 05/16/20 0200 05/18/20 0244  NA 128* 130* 130* 135 141  K 5.2* 5.2* 5.4* 4.5 5.5*  CL 98 99 100 106 108  CO2 17* 17* 18* 18* 23  GLUCOSE 230* 251* 349* 162* 282*  BUN 48* 66* 80* 73* 61*  CREATININE 2.55* 3.05* 2.80* 2.11* 1.91*  CALCIUM 9.0 9.1 8.9 9.0 9.8  PHOS  --  4.1 4.2 2.8 3.9   CBC Recent Labs  Lab 06/06/2020 1258 05/15/20 0155 05/16/20 0200 05/18/20 0244  WBC 8.2 8.5 10.4 13.8*  NEUTROABS  --  7.9* 9.7* 12.4*  HGB 15.4 14.4 14.8 15.9  HCT 49.3 42.0 43.1 48.5  MCV 81.1 77.5* 76.1* 79.8*  PLT 268 224 222 228    Medications:    . apixaban  5 mg Oral BID  . vitamin C  500 mg Oral Daily  . aspirin EC  81 mg Oral Daily  . carvedilol  25 mg Oral BID WC  . Chlorhexidine  Gluconate Cloth  6 each Topical Daily  . cloNIDine  0.3 mg Oral TID  . dexamethasone (DECADRON) injection  12 mg Intravenous Daily  . insulin aspart  0-9 Units Subcutaneous Q4H  . [START ON 05/20/2020] insulin glargine  15 Units Subcutaneous Daily  . Ipratropium-Albuterol  1 puff Inhalation Q6H  . mouth rinse  15 mL Mouth Rinse BID  . NIFEdipine  90 mg Oral Daily  . pantoprazole  40 mg Oral Daily  . senna-docusate  1 tablet Oral QHS  . zinc sulfate  220 mg Oral Daily      Gean Quint  05/19/2020, 1:36 PM

## 2020-05-19 NOTE — Progress Notes (Addendum)
Paged Dr. Curly Shores to confirm hold Clonidine 0.3mg  due to BP 115/75, also notified her of attempts to wean O2, MRI can not accommodate HFNC and NRB, so attempting to wean NRB. -Dr. Curly Shores deferred Clonidine admin to primary team, so called E-link regarding clonidine and awaiting parameters.

## 2020-05-19 NOTE — Procedures (Signed)
Cortrak  Person Inserting Tube:  Rosezetta Schlatter, RD Tube Type:  Cortrak - 43 inches Tube Location:  Right nare Initial Placement:  Stomach Secured by: Bridle Technique Used to Measure Tube Placement:  Documented cm marking at nare/ corner of mouth Cortrak Secured At:  70 cm Procedure Comments:  Cortrak Tube Team Note:  Consult received to place a Cortrak feeding tube.   No x-ray is required. RN may begin using tube.   If the tube becomes dislodged please keep the tube and contact the Cortrak team at [email or web address] ([email or web address]) for replacement.  If after hours and replacement cannot be delayed, place a NG tube and confirm placement with an abdominal x-ray.      Jarome Matin, MS, RD, LDN, CNSC Inpatient Clinical Dietitian RD pager # available in [place]  After hours/weekend pager # available in [employer]
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
## 2020-05-20 ENCOUNTER — Inpatient Hospital Stay (HOSPITAL_COMMUNITY): Payer: Medicare Other

## 2020-05-20 DIAGNOSIS — I6330 Cerebral infarction due to thrombosis of unspecified cerebral artery: Secondary | ICD-10-CM | POA: Diagnosis not present

## 2020-05-20 DIAGNOSIS — I6340 Cerebral infarction due to embolism of unspecified cerebral artery: Secondary | ICD-10-CM

## 2020-05-20 DIAGNOSIS — J1282 Pneumonia due to coronavirus disease 2019: Secondary | ICD-10-CM | POA: Diagnosis not present

## 2020-05-20 DIAGNOSIS — I6389 Other cerebral infarction: Secondary | ICD-10-CM

## 2020-05-20 DIAGNOSIS — U071 COVID-19: Secondary | ICD-10-CM | POA: Diagnosis not present

## 2020-05-20 LAB — URINALYSIS, ROUTINE W REFLEX MICROSCOPIC
Bilirubin Urine: NEGATIVE
Glucose, UA: NEGATIVE mg/dL
Ketones, ur: NEGATIVE mg/dL
Leukocytes,Ua: NEGATIVE
Nitrite: NEGATIVE
Protein, ur: 100 mg/dL — AB
Specific Gravity, Urine: 1.017 (ref 1.005–1.030)
pH: 5 (ref 5.0–8.0)

## 2020-05-20 LAB — CBC
HCT: 54.4 % — ABNORMAL HIGH (ref 39.0–52.0)
Hemoglobin: 17.1 g/dL — ABNORMAL HIGH (ref 13.0–17.0)
MCH: 25.4 pg — ABNORMAL LOW (ref 26.0–34.0)
MCHC: 31.4 g/dL (ref 30.0–36.0)
MCV: 80.8 fL (ref 80.0–100.0)
Platelets: 178 10*3/uL (ref 150–400)
RBC: 6.73 MIL/uL — ABNORMAL HIGH (ref 4.22–5.81)
RDW: 17.2 % — ABNORMAL HIGH (ref 11.5–15.5)
WBC: 18.4 10*3/uL — ABNORMAL HIGH (ref 4.0–10.5)
nRBC: 0.3 % — ABNORMAL HIGH (ref 0.0–0.2)

## 2020-05-20 LAB — RPR: RPR Ser Ql: NONREACTIVE

## 2020-05-20 LAB — PHOSPHORUS
Phosphorus: 6.5 mg/dL — ABNORMAL HIGH (ref 2.5–4.6)
Phosphorus: 4.8 mg/dL — ABNORMAL HIGH (ref 2.5–4.6)

## 2020-05-20 LAB — GLUCOSE, CAPILLARY
Glucose-Capillary: 205 mg/dL — ABNORMAL HIGH (ref 70–99)
Glucose-Capillary: 205 mg/dL — ABNORMAL HIGH (ref 70–99)
Glucose-Capillary: 216 mg/dL — ABNORMAL HIGH (ref 70–99)
Glucose-Capillary: 255 mg/dL — ABNORMAL HIGH (ref 70–99)
Glucose-Capillary: 339 mg/dL — ABNORMAL HIGH (ref 70–99)
Glucose-Capillary: 390 mg/dL — ABNORMAL HIGH (ref 70–99)

## 2020-05-20 LAB — LIPID PANEL
Cholesterol: 217 mg/dL — ABNORMAL HIGH (ref 0–200)
HDL: 43 mg/dL (ref >40)
LDL Cholesterol: 118 mg/dL — ABNORMAL HIGH (ref 0–99)
Total CHOL/HDL Ratio: 5 RATIO
Triglycerides: 280 mg/dL — ABNORMAL HIGH (ref <150)
VLDL: 56 mg/dL — ABNORMAL HIGH (ref 0–40)

## 2020-05-20 LAB — ECHOCARDIOGRAM LIMITED
Height: 71 in
P 1/2 time: 446 msec
S' Lateral: 3 cm
Weight: 2807.78 oz

## 2020-05-20 LAB — VITAMIN B12: Vitamin B-12: 418 pg/mL (ref 180–914)

## 2020-05-20 LAB — HEMOGLOBIN A1C
Hgb A1c MFr Bld: 8 % — ABNORMAL HIGH (ref 4.8–5.6)
Mean Plasma Glucose: 182.9 mg/dL

## 2020-05-20 LAB — BASIC METABOLIC PANEL
Anion gap: 15 (ref 5–15)
BUN: 124 mg/dL — ABNORMAL HIGH (ref 8–23)
CO2: 15 mmol/L — ABNORMAL LOW (ref 22–32)
Calcium: 9.3 mg/dL (ref 8.9–10.3)
Chloride: 119 mmol/L — ABNORMAL HIGH (ref 98–111)
Creatinine, Ser: 2.87 mg/dL — ABNORMAL HIGH (ref 0.61–1.24)
GFR, Estimated: 24 mL/min — ABNORMAL LOW (ref >60)
Glucose, Bld: 247 mg/dL — ABNORMAL HIGH (ref 70–99)
Potassium: 4.6 mmol/L (ref 3.5–5.1)
Sodium: 149 mmol/L — ABNORMAL HIGH (ref 135–145)

## 2020-05-20 LAB — MAGNESIUM
Magnesium: 3.2 mg/dL — ABNORMAL HIGH (ref 1.7–2.4)
Magnesium: 3 mg/dL — ABNORMAL HIGH (ref 1.7–2.4)

## 2020-05-20 LAB — OCCULT BLOOD X 1 CARD TO LAB, STOOL: Fecal Occult Bld: POSITIVE — AB

## 2020-05-20 LAB — HIV ANTIBODY (ROUTINE TESTING W REFLEX): HIV Screen 4th Generation wRfx: NONREACTIVE

## 2020-05-20 LAB — AMMONIA: Ammonia: 37 umol/L — ABNORMAL HIGH (ref 9–35)

## 2020-05-20 LAB — TSH: TSH: 3.159 u[IU]/mL (ref 0.350–4.500)

## 2020-05-20 LAB — APTT: aPTT: 62 seconds — ABNORMAL HIGH (ref 24–36)

## 2020-05-20 MED ORDER — CHLORHEXIDINE GLUCONATE 0.12 % MT SOLN
15.0000 mL | Freq: Two times a day (BID) | OROMUCOSAL | Status: DC
  Administered 2020-05-20 – 2020-05-21 (×4): 15 mL via OROMUCOSAL
  Filled 2020-05-20: qty 15

## 2020-05-20 MED ORDER — HEPARIN (PORCINE) 25000 UT/250ML-% IV SOLN
850.0000 [IU]/h | INTRAVENOUS | Status: DC
  Administered 2020-05-20: 850 [IU]/h via INTRAVENOUS
  Filled 2020-05-20 (×3): qty 250

## 2020-05-20 MED ORDER — SODIUM BICARBONATE 650 MG PO TABS
1300.0000 mg | ORAL_TABLET | Freq: Three times a day (TID) | ORAL | Status: DC
  Administered 2020-05-20 (×2): 1300 mg via ORAL
  Filled 2020-05-20 (×2): qty 2

## 2020-05-20 MED ORDER — PERFLUTREN LIPID MICROSPHERE
1.0000 mL | INTRAVENOUS | Status: AC | PRN
  Administered 2020-05-20: 2 mL via INTRAVENOUS
  Filled 2020-05-20: qty 10

## 2020-05-20 MED ORDER — INSULIN GLARGINE 100 UNIT/ML ~~LOC~~ SOLN
20.0000 [IU] | Freq: Every day | SUBCUTANEOUS | Status: DC
  Administered 2020-05-20: 20 [IU] via SUBCUTANEOUS
  Filled 2020-05-20 (×2): qty 0.2

## 2020-05-20 MED ORDER — AMIODARONE LOAD VIA INFUSION
150.0000 mg | Freq: Once | INTRAVENOUS | Status: AC
  Administered 2020-05-20: 150 mg via INTRAVENOUS
  Filled 2020-05-20: qty 83.34

## 2020-05-20 MED ORDER — INSULIN ASPART 100 UNIT/ML ~~LOC~~ SOLN
0.0000 [IU] | SUBCUTANEOUS | Status: DC
  Administered 2020-05-20: 15 [IU] via SUBCUTANEOUS
  Administered 2020-05-20: 11 [IU] via SUBCUTANEOUS

## 2020-05-20 MED ORDER — ORAL CARE MOUTH RINSE
15.0000 mL | Freq: Two times a day (BID) | OROMUCOSAL | Status: DC
  Administered 2020-05-20 – 2020-05-21 (×3): 15 mL via OROMUCOSAL

## 2020-05-20 MED ORDER — AMIODARONE HCL IN DEXTROSE 360-4.14 MG/200ML-% IV SOLN
30.0000 mg/h | INTRAVENOUS | Status: DC
  Administered 2020-05-21: 30 mg/h via INTRAVENOUS
  Filled 2020-05-20: qty 200

## 2020-05-20 MED ORDER — ATORVASTATIN CALCIUM 40 MG PO TABS
40.0000 mg | ORAL_TABLET | Freq: Every day | ORAL | Status: DC
  Administered 2020-05-20: 40 mg via ORAL
  Filled 2020-05-20: qty 1

## 2020-05-20 MED ORDER — METOPROLOL TARTRATE 5 MG/5ML IV SOLN
5.0000 mg | INTRAVENOUS | Status: DC | PRN
  Administered 2020-05-20: 5 mg via INTRAVENOUS
  Filled 2020-05-20: qty 5

## 2020-05-20 MED ORDER — AMIODARONE HCL IN DEXTROSE 360-4.14 MG/200ML-% IV SOLN
60.0000 mg/h | INTRAVENOUS | Status: AC
  Administered 2020-05-20 – 2020-05-21 (×2): 60 mg/h via INTRAVENOUS
  Filled 2020-05-20 (×2): qty 200

## 2020-05-20 MED ORDER — SODIUM CHLORIDE 0.45 % IV SOLN: INTRAVENOUS | Status: AC

## 2020-05-20 MED ORDER — INSULIN ASPART 100 UNIT/ML ~~LOC~~ SOLN
2.0000 [IU] | SUBCUTANEOUS | Status: DC
  Administered 2020-05-20 (×2): 6 [IU] via SUBCUTANEOUS

## 2020-05-20 NOTE — Progress Notes (Addendum)
NAMEJAPHET [name], MRN:  [id number], DOB:  February 29, 1956, LOS: 7 ADMISSION DATE:  05/30/2020, CONSULTATION DATE:  05/20/2020 REFERRING MD:  Alfredia Ferguson  CHIEF COMPLAINT:  Dyspnea   Brief History   [name] is a 65 y.o. male who was admitted 1/4 with COVID PNA requiring HHFNC.  PCCM asked to see in consultation.  History of present illness   [name] is a 65 y.o. male who has a PMH including but not limited to dCHF, A.fib on Apixaban, HTN, HLD, CKD with nephrectomy s/p transplant, DM, SBO (see "past medical history" for rest).  He presented to [facility] ED 1/4 with dyspnea and cough productive of yellow sputum for 3 days.  EMS was called to his home and on their arrival, he was hypoxic to the 70's on room air.  COVID PCR returned positive (he did receive 2 doses of the Pfizer vaccine on 09/10/19 and 10/01/19).  He was initially placed on NRB but had sats in low to mid 80's.  He was upgraded to [facility] at 45L with improvement in sats to 90's; however, he remained dyspneic.  TRH was called for admission and upon their evaluation, they felt that he was at risk for fatigue; therefore, PCCM was asked to see in consultation.  Past Medical History  has DM (diabetes mellitus), type 2, uncontrolled ([place]); HTN (hypertension); Accelerated hypertension; Uncontrolled hypertension; Other complications due to renal dialysis device, implant, and graft; Pseudoaneurysm of Left forearm loop AVG; Status post revision of total replacement of left knee; Atrial flutter ([place]); Chest pain with moderate risk for cardiac etiology; Hyperlipidemia; Chronic diastolic heart failure ([place]); Hypokalemia; Methadone use; Chronic venous insufficiency; Venous stasis dermatitis of both lower extremities; CHF exacerbation (HCC); CKD (chronic kidney disease) stage 3, GFR 30-59 ml/min ([place]); Acute CHF (congestive heart failure) ([place]); Lymphedema; Acute encephalopathy; Pulmonary hypertension, unspecified ([place]); IDDM (insulin dependent diabetes mellitus);  Hepatitis C antibody test positive; CHF (congestive heart failure) ([place]); Unintentional methadone overdose ([place]); Acute respiratory failure with hypoxia ([place]); Acute respiratory failure ([place]); AKI (acute kidney injury) ([place]); Aspiration pneumonia ([place]); Opiate overdose ([place]); Elevated serum creatinine; Proteinuria; Chronic coronary artery disease; Herpes simplex labialis; Immunosuppression ([place]); Mechanical complication of internal orthopedic device ([place]); Mitral regurgitation; Angiopathy; Pneumonia due to COVID-19 virus; Severe sepsis ([place]); Acute hypoxemic respiratory failure ([place]); Atrial fibrillation, chronic ([place]); and Cerebral thrombosis with cerebral infarction on their problem list.  Significant Hospital Events   1/4 > admit  Consults:  PCCM  Procedures:  None  Significant Diagnostic Tests:  CXR 1/4 > patchy bilateral infiltrates.  Micro Data:  Flu 1/4 > negative. COVID 1/4 > positive. Blood 1/4 > NGTD Sputum 1/4 >   Antimicrobials:  Decadron  Interim history/subjective:  CT head showed acute infarct in left PCA territory, neurology consulted, felt it was likely cardioembolic, continue ASA, hold anticoagulation, stroke work up.  Coretrack placed yesterday, was able to receive oral medications.   Patient minimally responsive, some movement in his left leg with painful stimuli, does moan with movement.  Currently on Precedex drip.   Objective:  Blood pressure (!) 142/89, pulse (!) 107, temperature 98.9 F (37.2 C), temperature source Axillary, resp. rate (!) 28, height 5\' 11"  (1.803 m), weight 79.6 kg, SpO2 95 %.    FiO2 (%):  [80 %-100 %] 80 %   Intake/Output Summary (Last 24 hours) at 05/20/2020 0706 Last data filed at 05/20/2020 0600 Gross per 24 hour  Intake 628.24 ml  Output 800 ml  Net -171.76 ml  Filed Weights   05/15/20 0355 05/16/20 0348 05/20/20 0443  Weight: 86.4 kg 90.9 kg 79.6 kg    Examination: General: Encephalopathic, mild distress, minimally  responsive, on Precedex drip HEENT: [place]/AT, MM pink/moist  Neuro: Not opening eyes, responding to voice.  Not following commands.  Encephalopathic.  Loose left leg to painful stimuli, no response in right leg or to pressure CV: Tachycardic, regular rate and rhythm, no murmurs rubs or gallops  PULM: Mild respiratory distress, tachypnea, decreased breath sounds throughout  GI: Soft, nondistended, normoactive bowel sounds Extremities: warm/dry, no lower extremity edema  Skin: no rashes or lesions  Assessment & Plan:  1. Acute Hypoxemic Respiratory failure 2. Severe Covid 19 Pneumonia Currently on high flow nasal cannula 35 L and 80% FiO2 to maintain SPO2 90-92%. Chest x-ray showed multifocal bilateral opacities.  Continues to be tachypneic with increased work of breathing on exam. Patient is at risk for need for intubation given his tachypnea and respiratory distress.   -Continue Decadron, on day 8 of steroids -Received Actemra 1/7 -Continue high flow nasal cannula, 35 L 80% FiO2, wean as tolerated  3.  Acute encephalopathy Could be secondary to severe Covid pneumonia, hypoxemia, worsening renal function, and hospitalization.  CT head did show acute left PCA territory infarct however unlikely to be the cause of his acute encephalopathy.  Neurology following, recommends work-up with TSH, ammonia, B12, RPR, and HIV.  As well as EEG.  Patient continues to have severe encephalopathy minimal responsiveness.  -Continue treatment for Covid pneumonia as above -TSH-3.1659, ammonia-37, B12-14, RPR-pending, and HIV-pending  -Follow-up EEG -Coretrack for p.o. medications  4. Left PCA territory stroke Likely cardioembolic in setting of atrial fibrillation and COVID-19.  Currently on aspirin 81 mg daily and holding anticoagulation.   -Neurology following, obtaining stroke work-up, appreciate recommendations -Continue ASA -Holding Eliquis -A1c 8, Lipid panel showed cholesterol 217, triglycerides 280,  LDL 118 -Follow-up MRI brain without contrast  -Follow-up echocardiogram -Follow-up MRA without contrast -Carotid Dopplers -Continue telemetry -PT/OT/SLP  5.  Chronic kidney disease stage IIIb, status post renal transplant.  Kidney function was stable yesterday.  Holding mycophenolate and tacrolimus per transplant nephrology recommendations.  -S/p lasix -Nephrology following, appreciate recommendations -Monitor kidney function  6. Atrial fibrillation Patient remains in atrial fibrillation. Last Eliquis dose was on 1/7. Is also on Coreg at home -Place core track today and restart home Coreg -Holding Eliquis in setting of acute left PCA stroke  7.  Hyperglycemia: -Increase lantus and sliding scale insulin -CBGs ranging around 170-200s.  -Goal CBG 140-180  8. HTN: Labile blood pressures. Coreg, clonidine, hydralazine, nifedipine ordered, able to receive p.o. medications via Coretrack.  Blood pressure goal normotension.  -Hydralazine 10 mg PRN -Clonidine 0.3 mg TID -Coreg 25 mg BID  Best practice (evaluated daily)  Diet: Via coretrack  Pain/Anxiety/Delirium protocol (if indicated): precedex as needed for agitation.  VAP protocol (if indicated): n/a DVT prophylaxis: SCDs GI prophylaxis: PPI  Glucose control: SSI Mobility: bedrest Disposition: ICU  Goals of Care:  Last date of multidisciplinary goals of care discussion: Updated wife via phone on 1/10, discussed worsening respiratory status. No goals of care were discussed.  Family and staff present:  Summary of discussion:  Follow up goals of care discussion due:  Code Status: Full  Labs   CBC: Recent Labs  Lab 05/10/2020 1258 05/15/20 0155 05/16/20 0200 05/18/20 0244 05/19/20 1356  WBC 8.2 8.5 10.4 13.8* 18.0*  NEUTROABS  --  7.9* 9.7* 12.4*  --   HGB  15.4 14.4 14.8 15.9 18.6*  HCT 49.3 42.0 43.1 48.5 55.2*  MCV 81.1 77.5* 76.1* 79.8* 78.0*  PLT 268 224 222 228 161   Basic Metabolic Panel: Recent Labs  Lab  05/14/20 0708 05/15/20 0155 05/16/20 0200 05/18/20 0244 05/19/20 1356 05/19/20 1627  NA 130* 130* 135 141 146*  --   K 5.2* 5.4* 4.5 5.5* 4.6  --   CL 99 100 106 108 116*  --   CO2 17* 18* 18* 23 11*  --   GLUCOSE 251* 349* 162* 282* 203*  --   BUN 66* 80* 73* 61* 94*  --   CREATININE 3.05* 2.80* 2.11* 1.91* 2.39*  --   CALCIUM 9.1 8.9 9.0 9.8 9.7  --   MG 2.5* 2.4 2.6* 2.7*  --  3.2*  PHOS 4.1 4.2 2.8 3.9  --  5.5*   GFR: Estimated Creatinine Clearance: 33.3 mL/min (A) (by C-G formula based on SCr of 2.39 mg/dL (H)). Recent Labs  Lab 06/01/2020 1258 05/15/20 0155 05/16/20 0200 05/18/20 0244 05/19/20 1356  PROCALCITON 0.24  --   --   --   --   WBC 8.2 8.5 10.4 13.8* 18.0*  LATICACIDVEN 1.5  --   --   --   --    Liver Function Tests: Recent Labs  Lab 05/14/20 0708 05/15/20 0155 05/16/20 0200 05/18/20 0244 05/19/20 1356  AST 35 22 23 35 34  ALT 13 10 12 18 16   ALKPHOS 93 108 108 147* 115  BILITOT 1.1 0.6 0.9 1.2 1.5*  PROT 7.5 6.2* 6.1* 6.5 6.5  ALBUMIN 2.7* 2.4* 2.3* 2.5* 2.5*   No results for input(s): LIPASE, AMYLASE in the last 168 hours. No results for input(s): AMMONIA in the last 168 hours. ABG    Component Value Date/Time   PHART 7.297 (L) 02/01/2019 2253   PCO2ART 52.8 (H) 02/01/2019 2253   PO2ART 65.6 (L) 02/01/2019 2253   HCO3 24.8 05/18/2020 0256   TCO2 28 01/27/2018 1626   ACIDBASEDEF 0.3 05/18/2020 0256   O2SAT 32.1 05/18/2020 0256    Coagulation Profile: No results for input(s): INR, PROTIME in the last 168 hours. Cardiac Enzymes: No results for input(s): CKTOTAL, CKMB, CKMBINDEX, TROPONINI in the last 168 hours. HbA1C: Hemoglobin A1C  Date/Time Value Ref Range Status  01/30/2020 11:29 AM 7.0 (A) 4.0 - 5.6 % Final  11/01/2019 09:57 AM 6.6 (A) 4.0 - 5.6 % Final   HbA1c, POC (prediabetic range)  Date/Time Value Ref Range Status  11/01/2019 09:57 AM 6.6 (A) 5.7 - 6.4 % Final   HbA1c, POC (controlled diabetic range)  Date/Time Value Ref  Range Status  11/01/2019 09:57 AM 6.6 0.0 - 7.0 % Final   HbA1c POC (<> result, manual entry)  Date/Time Value Ref Range Status  11/01/2019 09:57 AM 6.6 4.0 - 5.6 % Final   Hgb A1c MFr Bld  Date/Time Value Ref Range Status  01/29/2019 09:01 PM 6.4 (H) 4.8 - 5.6 % Final    Comment:    (NOTE) Pre diabetes:          5.7%-6.4% Diabetes:              >6.4% Glycemic control for   <7.0% adults with diabetes   05/24/2018 04:09 AM 7.0 (H) 4.8 - 5.6 % Final    Comment:    (NOTE) Pre diabetes:          5.7%-6.4% Diabetes:              >  6.4% Glycemic control for   <7.0% adults with diabetes    CBG: Recent Labs  Lab 05/19/20 1144 05/19/20 1638 05/19/20 1949 05/19/20 2336 05/20/20 0328  GLUCAP 176* 141* 165* [place]   Asencion Noble, M.D. PGY3 Pager [phone] 05/20/2020 7:06 AM

## 2020-05-20 NOTE — Progress Notes (Signed)
Removed Blood Glucose monitor from right upper arm for MRI and placed in pink denture cup with patient label, placed next to patient belonging bag.

## 2020-05-20 NOTE — Progress Notes (Signed)
Patients HR increased through the day as precedex was titrated off. MD notified of afib RVR with heart rate 130's-160's. Pt also hypertensive so this RN administered PRN hydralazine. Waiting on MD orders for Afib RVR control. Precedex restarted for now to help keep pt calm and possibly lower HR some as well.
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
## 2020-05-20 NOTE — Progress Notes (Signed)
Carotid artery duplex has been completed. Preliminary results can be found in CV Proc through chart review.   05/20/20 12:39 PM [name] RVT

## 2020-05-20 NOTE — Progress Notes (Signed)
ANTICOAGULATION CONSULT NOTE - Follow Up Consult  Pharmacy Consult for heparin Indication: stroke  Allergies  Allergen Reactions  . Tramadol Hives, Itching and Rash    Patient Measurements: Height: 5\' 11"  (180.3 cm) Weight: 79.6 kg (175 lb 7.8 oz) IBW/kg (Calculated) : 75.3  Vital Signs: Temp: 98.3 F (36.8 C) (01/11 2000) Temp Source: Axillary (01/11 2000) BP: 153/96 (01/11 2200) Pulse Rate: 111 (01/11 2200)  Labs: Recent Labs    05/18/20 0244 05/19/20 1356 05/20/20 0714 05/20/20 2204  HGB 15.9 18.6* 17.1*  --   HCT 48.5 55.2* 54.4*  --   PLT 228 177 178  --   APTT  --   --   --  62*  CREATININE 1.91* 2.39* 2.87*  --     Estimated Creatinine Clearance: 27.7 mL/min (A) (by C-G formula based on SCr of 2.87 mg/dL (H)).  Assessment: 65 yo m presenting with COVID, now CVA On apixaban pta for afib - last dose yesterday  MRI pending SCr 2.87 CBC stable Heparin drip 850 uts/hr with initial aptt 62sec, slightly < goal.   With new CVA will not push too fast, as to not increase bleed risk.  Afib 100s  Goal of Therapy:  Heparin level 0.3 - 0.5 units/ml aPTT 66- 85 seconds Monitor platelets by anticoagulation protocol: Yes   Plan:  Continue Heparin 850 units/hr  Let aptt drift up, I still < goal in am increase heparin drip rate Daily aptt and cbc Monitor s/s bleeding     Bonnita Nasuti Pharm.D. CPP, BCPS Clinical Pharmacist [phone] 05/20/2020 10:56 PM     Please check AMION for all [place] numbers  05/20/2020 10:53 PM

## 2020-05-20 NOTE — Progress Notes (Signed)
[place] Progress Note Patient Name: [name] DOB: [birth year] MRN: [id number]   Date of Service  05/20/2020  HPI/Events of Note  AFIB with RVR - Ventricular rate = 110's to 140's.  Laxst K+ = 4.6 and Mg++ = 3.2.   eICU Interventions  Plan: 1. Amiodarone IV bolus and infusion.  2. Repeat BMP and Mg++ at 5 AM.     Intervention Category Major Interventions: Arrhythmia - evaluation and management  Lysle Dingwall 05/20/2020, 8:49 PM

## 2020-05-20 NOTE — Progress Notes (Signed)
  Echocardiogram 2D Echocardiogram has been performed.  Geoffery Lyons [place] 05/20/2020, 1:48 PM

## 2020-05-20 NOTE — Progress Notes (Signed)
Notified E-link Hemoccult Stool is Positive, patient has not had any more bowel movements, patient remains normotensive on 0.48mcg of Precedex, HR 90's-110's A-fib rhythm.

## 2020-05-20 NOTE — Progress Notes (Signed)
[place] Progress Note Patient Name: [name] DOB: March 04, 1956 MRN: [id number]   Date of Service  05/20/2020  HPI/Events of Note  Hyperglycemia - Blood glucose = 339.   eICU Interventions  Plan: 1. Will increase to Q 4 moderate Novolog SSI.      Intervention Category Major Interventions: Hyperglycemia - active titration of insulin therapy  Lysle Dingwall 05/20/2020, 8:43 PM

## 2020-05-20 NOTE — Progress Notes (Signed)
EEG complete - results pending 

## 2020-05-20 NOTE — Progress Notes (Signed)
Called MRI to confirm what time patient could come down for routine MRI, per MRI they are uncertain about time and will call back when ready.

## 2020-05-20 NOTE — Progress Notes (Signed)
[facility] Progress Note    Assessment/ Plan:   1.  AKI on CKD 3T, nonoliguric: BL 2. likely hemodynamic/ septic mediated in setting of COVID. Cr up to 2.9 today - holding MMF and tac, hope to add back tac as soon as possible but will hold off given his deterioration and new stroke - getting steroids which will provide some immunosuppression - holding aldactone and bactrim as well -high bun likely related to steroids and catabolic state -transplant u/s -ua w/ microscopy -starting 1/2NS @ 100cc/hr for 10 hours, monitor volume status  2. H/o DDKT 05/2013 -see above, holding MMF and tacrolimus given infection. Delay in adding back tac given clinical deterioration  3.  Acute hypoxic RF secondary to COVID-pneumonia: Worsening hypoxia over the past 2 days -Continue treatment per primary team -Continue O2 supplementation  4.  Afib:  - on Eliquis and carvedilol  5.  DMII - per primary  6.  Hypertension: BP fluctuating, worsened due to inability to take PO meds, coretrack in place now.  Continue current regimen and use Lasix as above  7.  Hyperkalemia: Likely multifactorial.  No labs today. Can use lokelma if needed  8. Metabolic acidosis: starting nahco3 1300mg  tid  9. Hypernatremia, worsening -likely related to diminished PO/free water intake -free water deficit around 3L -see above. 1/2NS. Rate/amount may not be enough to be ahead of his deficit but will start slowly  10. Left PCA CVA -mgmt per primary and neuro -mri/mra -likely cardioembolic event from afib and covid  Subjective:    Due to the COVID pandemic, in attempts to limit transmission and over-utilization of resources (e.g. PPE), the patient was seen virtually today by means of chart review, discussion with staff, and virtual discussion with patient as needed.   Found to have left pca territory stroke, cardioembolic likely. Due for MRI/MRA. uop 0.8L   Objective:   BP (!) 128/91 (BP Location:  Right Arm)   Pulse (!) 101   Temp 97.9 F (36.6 C) (Axillary)   Resp (!) 31   Ht 5\' 11"  (1.803 m)   Wt 79.6 kg   SpO2 96%   BMI 24.48 kg/m   Intake/Output Summary (Last 24 hours) at 05/20/2020 1304 Last data filed at 05/20/2020 1200 Gross per 24 hour  Intake 1117.22 ml  Output 325 ml  Net 792.22 ml   Weight change:   Physical Exam: Virtual consultation     Imaging: DG Abd 1 View  Result Date: 05/18/2020 CLINICAL DATA:  65 year old male with abdominal distension. EXAM: ABDOMEN - 1 VIEW COMPARISON:  Abdominal radiograph dated 02/02/2019. FINDINGS: Postsurgical changes of bowel with anastomotic suture in the left lower abdomen. There is large amount of stool throughout the colon. No bowel dilatation or evidence of obstruction. No free air or radiopaque calculi. The osseous structures are intact. Partially visualized bilateral pulmonary opacities. IMPRESSION: 1. Constipation. No bowel obstruction. 2. Bilateral pulmonary opacities. Electronically Signed   By: Anner Crete M.D.   On: 05/18/2020 18:31   CT HEAD WO CONTRAST  Result Date: 05/19/2020 CLINICAL DATA:  Stroke follow-up EXAM: CT HEAD WITHOUT CONTRAST TECHNIQUE: Contiguous axial images were obtained from the base of the skull through the vertex without intravenous contrast. COMPARISON:  05/19/2020 at 4:04 p.m. FINDINGS: Brain: Unchanged appearance of left PCA territory infarct. No acute hemorrhage. There is periventricular hypoattenuation compatible with chronic microvascular disease. Size and configuration of the CSF spaces are unchanged. Vascular: No abnormal hyperdensity of the major intracranial arteries or dural venous  sinuses. No intracranial atherosclerosis. Skull: The visualized skull base, calvarium and extracranial soft tissues are normal. Sinuses/Orbits: Opacification of both frontal sinuses. The orbits are normal. IMPRESSION: Unchanged appearance of left PCA territory infarct without acute hemorrhage or mass effect.  Electronically Signed   By: Ulyses Jarred M.D.   On: 05/19/2020 20:49   CT HEAD WO CONTRAST  Result Date: 05/19/2020 CLINICAL DATA:  Altered mental status. EXAM: CT HEAD WITHOUT CONTRAST TECHNIQUE: Contiguous axial images were obtained from the base of the skull through the vertex without intravenous contrast. COMPARISON:  May 22, 2018. FINDINGS: Brain: Left occipital low density is noted consistent with acute infarction. Mild chronic ischemic white matter disease is noted. No definite hemorrhage is noted. No midline shift is noted. Ventricular size is within normal limits. Vascular: No hyperdense vessel or unexpected calcification. Skull: Normal. Negative for fracture or focal lesion. Sinuses/Orbits: Bilateral frontal and ethmoid sinusitis is noted. Other: None. IMPRESSION: Acute left occipital infarction. MRI is recommended for further evaluation. Electronically Signed   By: Marijo Conception M.D.   On: 05/19/2020 16:25   DG CHEST PORT 1 VIEW  Result Date: 05/18/2020 CLINICAL DATA:  Shortness of breath. EXAM: PORTABLE CHEST 1 VIEW COMPARISON:  Most recent radiograph 2 days ago 05/16/2020. FINDINGS: Similar cardiomegaly. Unchanged mediastinal contours with aortic atherosclerosis. Diffuse fine reticular and interstitial opacities, with slight improvement at the left lung base from prior. No definite progression. No pneumothorax or visualized pleural effusion. No evidence of pneumomediastinum. Stable osseous structures. IMPRESSION: Diffuse fine reticular and interstitial opacities, with slight improvement at the left lung base from prior, edema versus multifocal infection. Cardiomegaly is stable. Electronically Signed   By: Keith Rake M.D.   On: 05/18/2020 18:33   VAS US CAROTID  Result Date: 05/20/2020 Carotid Arterial Duplex Study Indications:       CVA. Risk Factors:      Hypertension, hyperlipidemia, Diabetes. Limitations        Today's exam was limited due to the patient's respiratory                     variation, the patient's inability or unwillingness to                    cooperate and Patient positioning. Comparison Study:  No prior studies. Performing Technologist: Oliver Hum RVT  Examination Guidelines: A complete evaluation includes B-mode imaging, spectral Doppler, color Doppler, and power Doppler as needed of all accessible portions of each vessel. Bilateral testing is considered an integral part of a complete examination. Limited examinations for reoccurring indications may be performed as noted.  Right Carotid Findings: +----------+--------+--------+--------+------------------+--------+           PSV cm/sEDV cm/sStenosisPlaque DescriptionComments +----------+--------+--------+--------+------------------+--------+ CCA Prox  60      1                                 tortuous +----------+--------+--------+--------+------------------+--------+ CCA Distal57      1                                          +----------+--------+--------+--------+------------------+--------+ ICA Prox  20      2  tortuous +----------+--------+--------+--------+------------------+--------+ ICA Distal37      2                                 tortuous +----------+--------+--------+--------+------------------+--------+ ECA       71      0                                          +----------+--------+--------+--------+------------------+--------+ +----------+--------+-------+--------+-------------------+           PSV cm/sEDV cmsDescribeArm Pressure (mmHG) +----------+--------+-------+--------+-------------------+ [id number]                                        +----------+--------+-------+--------+-------------------+ +---------+--------+--+--------+-+--------------+ VertebralPSV cm/s29EDV cm/s4High resistant +---------+--------+--+--------+-+--------------+  Left Carotid Findings:  +----------+--------+--------+--------+-----------------------+--------+           PSV cm/sEDV cm/sStenosisPlaque Description     Comments +----------+--------+--------+--------+-----------------------+--------+ CCA Prox  73      1               smooth and heterogenous         +----------+--------+--------+--------+-----------------------+--------+ CCA Distal46      1               smooth and heterogenous         +----------+--------+--------+--------+-----------------------+--------+ ICA Prox  27      6                                      tortuous +----------+--------+--------+--------+-----------------------+--------+ ICA Distal33      6                                      tortuous +----------+--------+--------+--------+-----------------------+--------+ ECA       38      6                                               +----------+--------+--------+--------+-----------------------+--------+ +----------+--------+--------+--------+-------------------+           PSV cm/sEDV cm/sDescribeArm Pressure (mmHG) +----------+--------+--------+--------+-------------------+ [id number]                                          +----------+--------+--------+--------+-------------------+ +---------+--------+--+--------+-+--------------+ VertebralPSV cm/s43EDV cm/s4High resistant +---------+--------+--+--------+-+--------------+   Summary: Right Carotid: Velocities in the right ICA are consistent with a 1-39% stenosis.                High resistant waveforms are noted throughout the carotid system. Left Carotid: Velocities in the left ICA are consistent with a 1-39% stenosis.               High resistant waveforms are noted throughout the carotid system. Vertebrals: Bilateral vertebral arteries demonstrate high resistant flow. *See table(s) above for measurements and observations.     Preliminary     Labs: BMET Recent Labs  Lab 05/14/20 0708 05/15/20 0155  05/16/20 0200 05/18/20 0244 05/19/20 1356  05/19/20 1627 05/20/20 0714  NA 130* 130* 135 141 146*  --  149*  K 5.2* 5.4* 4.5 5.5* 4.6  --  4.6  CL 99 100 106 108 116*  --  119*  CO2 17* 18* 18* 23 11*  --  15*  GLUCOSE 251* 349* 162* 282* 203*  --  247*  BUN 66* 80* 73* 61* 94*  --  124*  CREATININE 3.05* 2.80* 2.11* 1.91* 2.39*  --  2.87*  CALCIUM 9.1 8.9 9.0 9.8 9.7  --  9.3  PHOS 4.1 4.2 2.8 3.9  --  5.5* 6.5*   CBC Recent Labs  Lab 05/15/20 0155 05/16/20 0200 05/18/20 0244 05/19/20 1356 05/20/20 0714  WBC 8.5 10.4 13.8* 18.0* 18.4*  NEUTROABS 7.9* 9.7* 12.4*  --   --   HGB 14.4 14.8 15.9 18.6* 17.1*  HCT 42.0 43.1 48.5 55.2* 54.4*  MCV 77.5* 76.1* 79.8* 78.0* 80.8  PLT 224 222 228 177 178    Medications:    . vitamin C  500 mg Oral Daily  . aspirin EC  325 mg Oral Daily  . carvedilol  25 mg Oral BID WC  . chlorhexidine  15 mL Mouth Rinse BID  . Chlorhexidine Gluconate Cloth  6 each Topical Daily  . dexamethasone (DECADRON) injection  12 mg Intravenous Daily  . feeding supplement (PROSource TF)  45 mL Per Tube BID  . free water  200 mL Per Tube Q6H  . insulin aspart  2-6 Units Subcutaneous Q4H  . insulin glargine  20 Units Subcutaneous Daily  . Ipratropium-Albuterol  1 puff Inhalation Q6H  . mouth rinse  15 mL Mouth Rinse BID  . mouth rinse  15 mL Mouth Rinse q12n4p  . NIFEdipine  90 mg Oral Daily  . pantoprazole  40 mg Oral Daily  . senna-docusate  1 tablet Oral QHS  . zinc sulfate  220 mg Oral Daily      Gean Quint  05/20/2020, 1:04 PM

## 2020-05-20 NOTE — Progress Notes (Signed)
ANTICOAGULATION CONSULT NOTE - Initial Consult  Pharmacy Consult for heparin Indication: stroke  Allergies  Allergen Reactions  . Tramadol Hives, Itching and Rash    Patient Measurements: Height: 5\' 11"  (180.3 cm) Weight: 79.6 kg (175 lb 7.8 oz) IBW/kg (Calculated) : 75.3  Vital Signs: Temp: 97.9 F (36.6 C) (01/11 0700) Temp Source: Axillary (01/11 0700) BP: 128/91 (01/11 1200) Pulse Rate: 101 (01/11 1200)  Labs: Recent Labs    05/18/20 0244 05/19/20 1356 05/20/20 0714  HGB 15.9 18.6* 17.1*  HCT 48.5 55.2* 54.4*  PLT 228 177 178  CREATININE 1.91* 2.39* 2.87*    Estimated Creatinine Clearance: 27.7 mL/min (A) (by C-G formula based on SCr of 2.87 mg/dL (H)).  Assessment: 65 yo m presenting with COVID, now CVA On apixaban pta for afib - last dose yesterday  MRI pending SCr 2.87  CBC stable  Goal of Therapy:  Heparin level 0.3 - 0.5 units/ml aPTT 66- 85 seconds Monitor platelets by anticoagulation protocol: Yes   Plan:  Heparin 850 units/hr Initial aptt 2100 - will need to follow for now  Barth Kirks, PharmD, BCPS, BCCCP Clinical Pharmacist [phone]  Please check AMION for all [place] numbers  05/20/2020 1:03 PM

## 2020-05-21 ENCOUNTER — Inpatient Hospital Stay (HOSPITAL_COMMUNITY): Payer: Medicare Other

## 2020-05-21 DIAGNOSIS — U071 COVID-19: Secondary | ICD-10-CM | POA: Diagnosis not present

## 2020-05-21 DIAGNOSIS — J1282 Pneumonia due to coronavirus disease 2019: Secondary | ICD-10-CM | POA: Diagnosis not present

## 2020-05-21 DIAGNOSIS — J9601 Acute respiratory failure with hypoxia: Secondary | ICD-10-CM | POA: Diagnosis not present

## 2020-05-21 DIAGNOSIS — A419 Sepsis, unspecified organism: Secondary | ICD-10-CM | POA: Diagnosis not present

## 2020-05-21 DIAGNOSIS — R6521 Severe sepsis with septic shock: Secondary | ICD-10-CM | POA: Diagnosis not present

## 2020-05-21 DIAGNOSIS — I6340 Cerebral infarction due to embolism of unspecified cerebral artery: Secondary | ICD-10-CM | POA: Diagnosis not present

## 2020-05-21 DIAGNOSIS — R4182 Altered mental status, unspecified: Secondary | ICD-10-CM | POA: Diagnosis not present

## 2020-05-21 DIAGNOSIS — I6330 Cerebral infarction due to thrombosis of unspecified cerebral artery: Secondary | ICD-10-CM | POA: Diagnosis not present

## 2020-05-21 LAB — POCT I-STAT 7, (LYTES, BLD GAS, ICA,H+H)
Acid-base deficit: 7 mmol/L — ABNORMAL HIGH (ref 0.0–2.0)
Acid-base deficit: 5 mmol/L — ABNORMAL HIGH (ref 0.0–2.0)
Bicarbonate: 23.2 mmol/L (ref 20.0–28.0)
Bicarbonate: 20.2 mmol/L (ref 20.0–28.0)
Calcium, Ion: 1.13 mmol/L — ABNORMAL LOW (ref 1.15–1.40)
Calcium, Ion: 1.09 mmol/L — ABNORMAL LOW (ref 1.15–1.40)
HCT: 44 % (ref 39.0–52.0)
HCT: 45 % (ref 39.0–52.0)
Hemoglobin: 15 g/dL (ref 13.0–17.0)
Hemoglobin: 15.3 g/dL (ref 13.0–17.0)
O2 Saturation: 66 %
O2 Saturation: 95 %
Potassium: 4 mmol/L (ref 3.5–5.1)
Potassium: 4.9 mmol/L (ref 3.5–5.1)
Sodium: 155 mmol/L — ABNORMAL HIGH (ref 135–145)
Sodium: 147 mmol/L — ABNORMAL HIGH (ref 135–145)
TCO2: 25 mmol/L (ref 22–32)
TCO2: 21 mmol/L — ABNORMAL LOW (ref 22–32)
pCO2 arterial: 66.1 mmHg (ref 32.0–48.0)
pCO2 arterial: 37.3 mmHg (ref 32.0–48.0)
pH, Arterial: 7.153 — CL (ref 7.350–7.450)
pH, Arterial: 7.343 — ABNORMAL LOW (ref 7.350–7.450)
pO2, Arterial: 45 mmHg — ABNORMAL LOW (ref 83.0–108.0)
pO2, Arterial: 79 mmHg — ABNORMAL LOW (ref 83.0–108.0)

## 2020-05-21 LAB — BASIC METABOLIC PANEL
Anion gap: 17 — ABNORMAL HIGH (ref 5–15)
Anion gap: 20 — ABNORMAL HIGH (ref 5–15)
BUN: 136 mg/dL — ABNORMAL HIGH (ref 8–23)
BUN: 142 mg/dL — ABNORMAL HIGH (ref 8–23)
CO2: 14 mmol/L — ABNORMAL LOW (ref 22–32)
CO2: 20 mmol/L — ABNORMAL LOW (ref 22–32)
Calcium: 9 mg/dL (ref 8.9–10.3)
Calcium: 7.5 mg/dL — ABNORMAL LOW (ref 8.9–10.3)
Chloride: 119 mmol/L — ABNORMAL HIGH (ref 98–111)
Chloride: 111 mmol/L (ref 98–111)
Creatinine, Ser: 2.83 mg/dL — ABNORMAL HIGH (ref 0.61–1.24)
Creatinine, Ser: 3.64 mg/dL — ABNORMAL HIGH (ref 0.61–1.24)
GFR, Estimated: 24 mL/min — ABNORMAL LOW (ref >60)
GFR, Estimated: 18 mL/min — ABNORMAL LOW (ref >60)
Glucose, Bld: 308 mg/dL — ABNORMAL HIGH (ref 70–99)
Glucose, Bld: 360 mg/dL — ABNORMAL HIGH (ref 70–99)
Potassium: 4.7 mmol/L (ref 3.5–5.1)
Potassium: 4.9 mmol/L (ref 3.5–5.1)
Sodium: 150 mmol/L — ABNORMAL HIGH (ref 135–145)
Sodium: 151 mmol/L — ABNORMAL HIGH (ref 135–145)

## 2020-05-21 LAB — GLUCOSE, CAPILLARY
Glucose-Capillary: 341 mg/dL — ABNORMAL HIGH (ref 70–99)
Glucose-Capillary: 300 mg/dL — ABNORMAL HIGH (ref 70–99)
Glucose-Capillary: 179 mg/dL — ABNORMAL HIGH (ref 70–99)
Glucose-Capillary: 171 mg/dL — ABNORMAL HIGH (ref 70–99)
Glucose-Capillary: 187 mg/dL — ABNORMAL HIGH (ref 70–99)
Glucose-Capillary: 287 mg/dL — ABNORMAL HIGH (ref 70–99)
Glucose-Capillary: 254 mg/dL — ABNORMAL HIGH (ref 70–99)

## 2020-05-21 LAB — HEPARIN LEVEL (UNFRACTIONATED): Heparin Unfractionated: 0.94 IU/mL — ABNORMAL HIGH (ref 0.30–0.70)

## 2020-05-21 LAB — APTT: aPTT: 45 seconds — ABNORMAL HIGH (ref 24–36)

## 2020-05-21 LAB — SODIUM: Sodium: 150 mmol/L — ABNORMAL HIGH (ref 135–145)

## 2020-05-21 LAB — CBC
HCT: 47.2 % (ref 39.0–52.0)
Hemoglobin: 14.5 g/dL (ref 13.0–17.0)
MCH: 25.9 pg — ABNORMAL LOW (ref 26.0–34.0)
MCHC: 30.7 g/dL (ref 30.0–36.0)
MCV: 84.4 fL (ref 80.0–100.0)
Platelets: UNDETERMINED 10*3/uL (ref 150–400)
RBC: 5.59 MIL/uL (ref 4.22–5.81)
RDW: 17.3 % — ABNORMAL HIGH (ref 11.5–15.5)
WBC: 37.4 10*3/uL — ABNORMAL HIGH (ref 4.0–10.5)
nRBC: 1.1 % — ABNORMAL HIGH (ref 0.0–0.2)

## 2020-05-21 LAB — MAGNESIUM: Magnesium: 3.2 mg/dL — ABNORMAL HIGH (ref 1.7–2.4)

## 2020-05-21 MED ORDER — SODIUM BICARBONATE 650 MG PO TABS
1300.0000 mg | ORAL_TABLET | Freq: Three times a day (TID) | ORAL | Status: DC
  Administered 2020-05-21 (×3): 1300 mg
  Filled 2020-05-21 (×3): qty 2

## 2020-05-21 MED ORDER — INSULIN ASPART 100 UNIT/ML ~~LOC~~ SOLN
0.0000 [IU] | SUBCUTANEOUS | Status: DC
  Administered 2020-05-21 (×2): 11 [IU] via SUBCUTANEOUS
  Administered 2020-05-21: 15 [IU] via SUBCUTANEOUS
  Administered 2020-05-21: 11 [IU] via SUBCUTANEOUS
  Administered 2020-05-21 – 2020-05-22 (×3): 4 [IU] via SUBCUTANEOUS

## 2020-05-21 MED ORDER — POLYETHYLENE GLYCOL 3350 17 G PO PACK: 17.0000 g | PACK | Freq: Every day | ORAL | Status: DC

## 2020-05-21 MED ORDER — PANTOPRAZOLE SODIUM 40 MG IV SOLR
40.0000 mg | Freq: Two times a day (BID) | INTRAVENOUS | Status: DC
  Administered 2020-05-21 (×2): 40 mg via INTRAVENOUS
  Filled 2020-05-21 (×2): qty 40

## 2020-05-21 MED ORDER — DOCUSATE SODIUM 50 MG/5ML PO LIQD
100.0000 mg | Freq: Two times a day (BID) | ORAL | Status: DC
  Administered 2020-05-21: 100 mg
  Filled 2020-05-21: qty 10

## 2020-05-21 MED ORDER — DEXMEDETOMIDINE HCL IN NACL 400 MCG/100ML IV SOLN
0.4000 ug/kg/h | INTRAVENOUS | Status: DC
  Administered 2020-05-21: 1 ug/kg/h via INTRAVENOUS
  Filled 2020-05-21 (×2): qty 100

## 2020-05-21 MED ORDER — FREE WATER: 200.0000 mL | Status: DC

## 2020-05-21 MED ORDER — ROCURONIUM BROMIDE 10 MG/ML (PF) SYRINGE
PREFILLED_SYRINGE | INTRAVENOUS | Status: AC
  Administered 2020-05-21: 100 mg
  Filled 2020-05-21: qty 10

## 2020-05-21 MED ORDER — FENTANYL CITRATE (PF) 100 MCG/2ML IJ SOLN: 50.0000 ug | INTRAMUSCULAR | Status: DC | PRN

## 2020-05-21 MED ORDER — FAMOTIDINE IN NACL 20-0.9 MG/50ML-% IV SOLN
20.0000 mg | Freq: Every day | INTRAVENOUS | Status: DC
  Filled 2020-05-21: qty 50

## 2020-05-21 MED ORDER — CHLORHEXIDINE GLUCONATE 0.12% ORAL RINSE (MEDLINE KIT): 15.0000 mL | Freq: Two times a day (BID) | OROMUCOSAL | Status: DC

## 2020-05-21 MED ORDER — TACROLIMUS 1 MG PO CAPS: 1.0000 mg | ORAL_CAPSULE | Freq: Two times a day (BID) | ORAL | Status: DC

## 2020-05-21 MED ORDER — FENTANYL BOLUS VIA INFUSION
50.0000 ug | INTRAVENOUS | Status: DC | PRN
  Filled 2020-05-21: qty 50

## 2020-05-21 MED ORDER — INSULIN GLARGINE 100 UNIT/ML ~~LOC~~ SOLN
35.0000 [IU] | Freq: Every day | SUBCUTANEOUS | Status: DC
  Administered 2020-05-21: 35 [IU] via SUBCUTANEOUS
  Filled 2020-05-21 (×2): qty 0.35

## 2020-05-21 MED ORDER — FENTANYL CITRATE (PF) 100 MCG/2ML IJ SOLN
50.0000 ug | Freq: Once | INTRAMUSCULAR | Status: DC
Start: 2020-05-21 — End: 2020-05-22

## 2020-05-21 MED ORDER — ASPIRIN 325 MG PO TABS
325.0000 mg | ORAL_TABLET | Freq: Every day | ORAL | Status: DC
  Administered 2020-05-21: 325 mg
  Filled 2020-05-21: qty 1

## 2020-05-21 MED ORDER — GUAIFENESIN-DM 100-10 MG/5ML PO SYRP: 10.0000 mL | ORAL_SOLUTION | ORAL | Status: DC | PRN

## 2020-05-21 MED ORDER — ZINC SULFATE 220 (50 ZN) MG PO CAPS
220.0000 mg | ORAL_CAPSULE | Freq: Every day | ORAL | Status: DC
  Administered 2020-05-21: 220 mg
  Filled 2020-05-21: qty 1

## 2020-05-21 MED ORDER — FENTANYL 2500MCG IN NS 250ML (10MCG/ML) PREMIX INFUSION: 50.0000 ug/h | INTRAVENOUS | Status: DC

## 2020-05-21 MED ORDER — SODIUM CHLORIDE 0.45 % IV SOLN: INTRAVENOUS | Status: DC

## 2020-05-21 MED ORDER — ATORVASTATIN CALCIUM 40 MG PO TABS
40.0000 mg | ORAL_TABLET | Freq: Every day | ORAL | Status: DC
  Administered 2020-05-21: 40 mg
  Filled 2020-05-21: qty 1

## 2020-05-21 MED ORDER — ORAL CARE MOUTH RINSE: 15.0000 mL | OROMUCOSAL | Status: DC

## 2020-05-21 MED ORDER — HYDROCORTISONE NA SUCCINATE PF 100 MG IJ SOLR
50.0000 mg | Freq: Four times a day (QID) | INTRAMUSCULAR | Status: DC
  Administered 2020-05-21 – 2020-05-22 (×3): 50 mg via INTRAVENOUS
  Filled 2020-05-21 (×3): qty 2

## 2020-05-21 MED ORDER — TACROLIMUS 1 MG/ML ORAL SUSPENSION
1.0000 mg | Freq: Two times a day (BID) | ORAL | Status: DC
  Filled 2020-05-21: qty 1

## 2020-05-21 MED ORDER — SODIUM BICARBONATE 8.4 % IV SOLN
INTRAVENOUS | Status: AC
  Filled 2020-05-21: qty 150

## 2020-05-21 MED ORDER — FENTANYL CITRATE (PF) 100 MCG/2ML IJ SOLN
50.0000 ug | INTRAMUSCULAR | Status: DC | PRN
  Administered 2020-05-21: 100 ug via INTRAVENOUS
  Filled 2020-05-21: qty 2

## 2020-05-21 MED ORDER — PHENYLEPHRINE 40 MCG/ML (10ML) SYRINGE FOR IV PUSH (FOR BLOOD PRESSURE SUPPORT)
PREFILLED_SYRINGE | INTRAVENOUS | Status: AC
  Filled 2020-05-21: qty 20

## 2020-05-21 MED ORDER — PANTOPRAZOLE SODIUM 40 MG PO PACK
40.0000 mg | PACK | Freq: Every day | ORAL | Status: DC
  Administered 2020-05-21: 40 mg
  Filled 2020-05-21: qty 20

## 2020-05-21 MED ORDER — ASCORBIC ACID 500 MG PO TABS
500.0000 mg | ORAL_TABLET | Freq: Every day | ORAL | Status: DC
  Administered 2020-05-21: 500 mg
  Filled 2020-05-21: qty 1

## 2020-05-21 MED ORDER — CLONIDINE HCL 0.1 MG PO TABS: 0.1000 mg | ORAL_TABLET | Freq: Three times a day (TID) | ORAL | Status: DC | PRN

## 2020-05-21 MED ORDER — ETOMIDATE 2 MG/ML IV SOLN
INTRAVENOUS | Status: AC
  Administered 2020-05-21: 20 mg
  Filled 2020-05-21: qty 20

## 2020-05-21 MED ORDER — DOCUSATE SODIUM 50 MG/5ML PO LIQD: 100.0000 mg | Freq: Two times a day (BID) | ORAL | Status: DC

## 2020-05-21 MED ORDER — MIDAZOLAM HCL 2 MG/2ML IJ SOLN
INTRAMUSCULAR | Status: AC
  Filled 2020-05-21: qty 4

## 2020-05-21 MED ORDER — MIDAZOLAM HCL 2 MG/2ML IJ SOLN
2.0000 mg | INTRAMUSCULAR | Status: DC | PRN
  Administered 2020-05-21: 2 mg via INTRAVENOUS
  Filled 2020-05-21: qty 2

## 2020-05-21 MED ORDER — TACROLIMUS 1 MG/ML ORAL SUSPENSION
1.0000 mg | Freq: Two times a day (BID) | ORAL | Status: DC
  Administered 2020-05-21: 1 mg
  Filled 2020-05-21 (×3): qty 1

## 2020-05-21 MED ORDER — PREDNISONE 20 MG PO TABS
40.0000 mg | ORAL_TABLET | Freq: Every day | ORAL | Status: DC
  Administered 2020-05-21 – 2020-05-22 (×2): 40 mg
  Filled 2020-05-21 (×2): qty 2

## 2020-05-21 MED ORDER — EPINEPHRINE HCL 5 MG/250ML IV SOLN IN NS
0.5000 ug/min | INTRAVENOUS | Status: DC
  Administered 2020-05-21: 10 ug/min via INTRAVENOUS
  Administered 2020-05-21: 12 ug/min via INTRAVENOUS
  Administered 2020-05-22: 7 ug/min via INTRAVENOUS
  Filled 2020-05-21 (×3): qty 250

## 2020-05-21 MED ORDER — SENNOSIDES-DOCUSATE SODIUM 8.6-50 MG PO TABS
1.0000 | ORAL_TABLET | Freq: Every day | ORAL | Status: DC
  Administered 2020-05-21: 1
  Filled 2020-05-21: qty 1

## 2020-05-21 MED ORDER — CARVEDILOL 25 MG PO TABS: 25.0000 mg | ORAL_TABLET | Freq: Two times a day (BID) | ORAL | Status: DC

## 2020-05-21 MED ORDER — NOREPINEPHRINE 4 MG/250ML-% IV SOLN
INTRAVENOUS | Status: AC
  Filled 2020-05-21: qty 250

## 2020-05-21 MED ORDER — NOREPINEPHRINE 4 MG/250ML-% IV SOLN
0.0000 ug/min | INTRAVENOUS | Status: DC
  Administered 2020-05-21: 38 ug/min via INTRAVENOUS
  Administered 2020-05-21 (×3): 40 ug/min via INTRAVENOUS
  Administered 2020-05-21: 30 ug/min via INTRAVENOUS
  Administered 2020-05-21: 100 ug/min via INTRAVENOUS
  Administered 2020-05-21: 94 ug/min via INTRAVENOUS
  Administered 2020-05-22 (×2): 25 ug/min via INTRAVENOUS
  Filled 2020-05-21: qty 500
  Filled 2020-05-21 (×7): qty 250

## 2020-05-21 MED ORDER — FENTANYL CITRATE (PF) 100 MCG/2ML IJ SOLN
INTRAMUSCULAR | Status: AC
  Filled 2020-05-21: qty 2

## 2020-05-21 MED ORDER — SODIUM BICARBONATE 8.4 % IV SOLN
INTRAVENOUS | Status: AC
  Administered 2020-05-21: 50 meq
  Filled 2020-05-21: qty 100

## 2020-05-21 MED ORDER — SODIUM BICARBONATE 8.4 % IV SOLN
INTRAVENOUS | Status: AC
  Filled 2020-05-21 (×2): qty 850

## 2020-05-21 MED ORDER — POLYETHYLENE GLYCOL 3350 17 G PO PACK: 17.0000 g | PACK | Freq: Every day | ORAL | Status: DC | PRN

## 2020-05-21 MED ORDER — MIDAZOLAM HCL 2 MG/2ML IJ SOLN
2.0000 mg | INTRAMUSCULAR | Status: AC | PRN
  Administered 2020-05-21 – 2020-05-22 (×3): 2 mg via INTRAVENOUS
  Filled 2020-05-21 (×3): qty 2

## 2020-05-21 NOTE — Progress Notes (Signed)
NAMEDARRY KELNHOFER, MRN:  [id number], DOB:  1955-12-15, LOS: 8 ADMISSION DATE:  06/05/2020, CONSULTATION DATE:  05/28/2020 REFERRING MD:  Alfredia Ferguson  CHIEF COMPLAINT:  Dyspnea   Brief History   Rangel AHAN EISENBERGER is a 65 y.o. male who was admitted 1/4 with COVID PNA requiring HHFNC.  PCCM asked to see in consultation.  Transferred to ICU for agitation requiring Precedex.  Course complicated by multiple embolic CVA, rapid atrial fibrillation, AKI.  Past Medical History  has DM (diabetes mellitus), type 2, uncontrolled ([place]); HTN (hypertension); Accelerated hypertension; Uncontrolled hypertension; Other complications due to renal dialysis device, implant, and graft; Pseudoaneurysm of Left forearm loop AVG; Status post revision of total replacement of left knee; Atrial flutter ([place]); Chest pain with moderate risk for cardiac etiology; Hyperlipidemia; Chronic diastolic heart failure ([place]); Hypokalemia; Methadone use; Chronic venous insufficiency; Venous stasis dermatitis of both lower extremities; CHF exacerbation (H[facility]); CKD (chronic kidney disease) stage 3, GFR 30-59 ml/min ([place]); Acute CHF (congestive heart failure) ([place]); Lymphedema; Acute encephalopathy; Pulmonary hypertension, unspecified ([place]); IDDM (insulin dependent diabetes mellitus); Hepatitis C antibody test positive; CHF (congestive heart failure) ([place]); Unintentional methadone overdose ([place]); Acute respiratory failure with hypoxia ([place]); Acute respiratory failure ([place]); AKI (acute kidney injury) ([place]); Aspiration pneumonia ([place]); Opiate overdose ([place]); Elevated serum creatinine; Proteinuria; Chronic coronary artery disease; Herpes simplex labialis; Immunosuppression ([place]); Mechanical complication of internal orthopedic device ([place]); Mitral regurgitation; Angiopathy; Pneumonia due to COVID-19 virus; Severe sepsis ([place]); Acute hypoxemic respiratory failure ([place]); Atrial fibrillation, chronic ([place]); and Cerebral thrombosis with cerebral infarction on their  problem list.  Significant Hospital Events   1/4 > admit 1/8 transfer for ICU fro agitation requiring precedex 8/54 Multiple embolic strokes, Neuro consulted 1/12 Started amio for rapid a fib. Continues on precedex drip  Consults:  PCCM  Procedures:  None  Significant Diagnostic Tests:  CXR 1/4 > patchy bilateral infiltrates.  Micro Data:  Flu 1/4 > negative. COVID 1/4 > positive. Blood 1/4 > NGTD Sputum 1/4 >   Antimicrobials:  Decadron  Interim history/subjective:  CT head showed acute infarct in left PCA territory, neurology consulted, felt it was likely cardioembolic, continue ASA, hold anticoagulation, stroke work up.  Coretrack placed yesterday, was able to receive oral medications.   Patient minimally responsive, some movement in his left leg with painful stimuli, does moan with movement.  Currently on Precedex drip.   Objective:  Blood pressure (!) 105/58, pulse (!) 105, temperature 98.3 F (36.8 C), temperature source Axillary, resp. rate (!) 41, height 5\' 11"  (1.803 m), weight 79.9 kg, SpO2 94 %.    FiO2 (%):  [80 %-100 %] 90 %   Intake/Output Summary (Last 24 hours) at 05/21/2020 1116 Last data filed at 05/21/2020 1100 Gross per 24 hour  Intake 4018.82 ml  Output 1375 ml  Net 2643.82 ml   Filed Weights   05/16/20 0348 05/20/20 0443 05/21/20 0314  Weight: 90.9 kg 79.6 kg 79.9 kg    Examination: Blood pressure (!) 105/58, pulse (!) 105, temperature 98.3 F (36.8 C), temperature source Axillary, resp. rate (!) 41, height 5\' 11"  (1.803 m), weight 79.9 kg, SpO2 94 %. Gen:      No acute distress HEENT:  EOMI, sclera anicteric Neck:     No masses; no thyromegaly Lungs:    Clear to auscultation bilaterally; normal respiratory effort CV:         Regular rate and rhythm; no murmurs Abd:      + bowel sounds; soft, non-tender; no  palpable masses, no distension Ext:    No edema; adequate peripheral perfusion Skin:      Warm and dry; no rash Neuro: Obtunded,  unresponsive  Assessment & Plan:  Acute hypoxic respiratory failure, severe ARDS secondary to COVID-19 pneumonia Continue steroids.  He has received Actemra He has worsening respiratory status and worsening mental status.  Will likely need intubation later today We will try to get in touch with family  Multiple embolic infarcts, acute encephalopathy Likely has clotting secondary to COVID-19 and atrial fibrillation Supportive care.  Stroke work-up secondary to neurology Precedex for now Not protecting airway.    Chronic kidney disease s/p renal transplant Restart immunosuppressive medications per nephrology Monitor renal function  Atrial fibrillation Continue amiodarone Hold anticoagulation  GI bleed Stopping anticoagulation.  Follow CBC Will need GI consult if hemoglobin drops.  Hyperglycemia Increase Lantus dose.  HTN Holding blood pressure medications due to hypotension.  Best practice (evaluated daily)  Diet: Via coretrack  Pain/Anxiety/Delirium protocol (if indicated): precedex as needed for agitation.  VAP protocol (if indicated): n/a DVT prophylaxis: SCDs GI prophylaxis: PPI  Glucose control: SSI Mobility: bedrest Disposition: ICU  Goals of Care:  Last date of multidisciplinary goals of care discussion: Updated wife via phone on 1/10, discussed worsening respiratory status. No goals of care were discussed.  Family and staff present:  Summary of discussion:  Follow up goals of care discussion due:  Code Status: Full  Critical care:   The patient is critically ill with multiple organ system failure and requires high complexity decision making for assessment and support, frequent evaluation and titration of therapies, advanced monitoring, review of radiographic studies and interpretation of complex data.   Critical Care Time devoted to patient care services, exclusive of separately billable procedures, described in this note is 45 minutes.   Marshell Garfinkel  MD [facility] Please see [email or web address] for pager details.  05/21/2020, 2:41 PM

## 2020-05-21 NOTE — Progress Notes (Signed)
Notified E-link of reddish brown stool, patient on heparin drip per stroke protocol. Notified E-link glucose 390, tube feeds now at goal.

## 2020-05-21 NOTE — Plan of Care (Signed)

## 2020-05-21 NOTE — Progress Notes (Signed)
Called to room peri arrest. Pt had already undergone 15 minutes of ACLS w/ initial PEA rhythm. This was in spite of already establishing airway and ventilation w/ ciculatory support earlier w/ norepi.  After several rounds of CPR and epinephrine we did regain ROSC. Marland Kitchen Code team reports 15 minutes to achieve this. We placed pt on epinephrine gtt in addition to norepi. Awaiting patient's wifes arrival .  He is not going to recover from this. We will need to discuss where we go from here. I think that further ACLS would be futile at this point.   Erick Colace ACNP-BC [place] Pager # [phone] OR # [phone] if no answer

## 2020-05-21 NOTE — Procedures (Signed)
Central Venous Catheter Insertion Procedure Note  [name]  [id number]  June 21, 1955  Date:05/21/20  Time:1:44 PM   Provider Performing:Pete Johnette Abraham Kary Kos   Procedure: Insertion of Non-tunneled Central Venous [phone]) with US guidance ([id number])   Indication(s) Medication administration and Difficult access  Consent Risks of the procedure as well as the alternatives and risks of each were explained to the patient and/or caregiver.  Consent for the procedure was obtained and is signed in the bedside chart  Anesthesia Topical only with 1% lidocaine   Timeout Verified patient identification, verified procedure, site/side was marked, verified correct patient position, special equipment/implants available, medications/allergies/relevant history reviewed, required imaging and test results available.  Sterile Technique Maximal sterile technique including full sterile barrier drape, hand hygiene, sterile gown, sterile gloves, mask, hair covering, sterile ultrasound probe cover (if used).  Procedure Description Area of catheter insertion was cleaned with chlorhexidine and draped in sterile fashion.  With real-time ultrasound guidance a central venous catheter was placed into the right internal jugular vein. Nonpulsatile blood flow and easy flushing noted in all ports.  The catheter was sutured in place and sterile dressing applied.  Complications/Tolerance None; patient tolerated the procedure well. Chest X-ray is ordered to verify placement for internal jugular or subclavian cannulation.   Chest x-ray is not ordered for femoral cannulation.  EBL Minimal  Specimen(s) None  Erick Colace ACNP-BC [place] Pager # [phone] OR # [phone] if no answer

## 2020-05-21 NOTE — Progress Notes (Signed)
Daughter called. [name] She is only 16. She tells me her mother gets home around 230pm. We have made several attempts to reach [name] w/ no success. I have asked her daughter to call the nursing station when she gets home and message [place] with a way to get in touch with [name] in case of emergency  I did briefly update her but given her age I refrained from very in depth discussion which we will hold off from until we get in touch with the pt's spouse    Erick Colace ACNP-BC [place] Pager # [phone] OR # [phone] if no answer

## 2020-05-21 NOTE — Progress Notes (Signed)
[name] Progress Note    Assessment/ Plan:   1.  AKI on CKD 3T, nonoliguric: BL 2. likely hemodynamic/ septic mediated in setting of COVID. Cr stable at 2.8 today. I believe his slight rise in Cr is due to dehydration as of right now - holding MMF. Adding back tac, see below - getting steroids which will provide some immunosuppression - holding aldactone and bactrim as well -high bun likely related to steroids and catabolic state -transplant u/s reviewed: borderline low RI's, possible transplant artery stenosis. Do not have any recent ultrasounds to compare to in [place], last ultrasound was in 2016 which had normal RI's. Ideally, would perform a CTA to look into this, however his clinical status and kidney function is a limiting factor for this -ua w/ microscopy revealed many bacteria and hyaline casts -hypotonic fluids today -recommend checking urine culture  2. H/o DDKT 05/2013 -see above, holding MMF given infection. Restarting tacrolimus tonight. On 2+25m at home. Will start with 1+116m Baseline trough ordered for the AM  3.  AHRF secondary to COVID-pneumonia: Worsening hypoxia over the past 2 days -Continue treatment per primary team -Continue O2 supplementation -spot diuresis  4.  Afib:  - hep gtt  5.  DMII - per primary  6.  Hypertension: BP fluctuating, worsened due to inability to take PO meds, coretrack in place now.  Continue current regimen and use Lasix as needed  7.  Hyperkalemia: Likely multifactorial.  No labs today. Can use lokelma if needed  8. Metabolic acidosis: started nahco3 130027mid -worsening -blood glucose also 308, no ketones on UA 1/11 -on bicarb gtt -recommend checking blood gas and lactate  9. Hypernatremia, worsening -corrected Na around 153 -1/2NS for 2L -FWF increased to q4h -free water deficit around 4.4L currently  10. Left PCA CVA -mgmt per primary and neuro -likely cardioembolic event from afib and  covid  Subjective:    uop 1.5L. na worsened. Poor mentation/unresponsive   Objective:   BP (!) 88/54 (BP Location: Right Arm)   Pulse (!) 111   Temp 98.3 F (36.8 C) (Axillary)   Resp (!) 46   Ht '5\' 11"'  (1.803 m)   Wt 79.9 kg   SpO2 93%   BMI 24.57 kg/m   Intake/Output Summary (Last 24 hours) at 05/21/2020 1321 Last data filed at 05/21/2020 1200 Gross per 24 hour  Intake 3791.37 ml  Output 1075 ml  Net 2716.37 ml   Weight change: 0.3 kg  Physical Exam:  Gen: ill appearing, nad Resp: no iwob CV: irreg irreg Abd: soft nt/nd Ext: no edema Neuro: obtunded     Imaging: EEG  Result Date: 05/21/2020 [name]     05/21/2020  8:18 AM Patient Name: [name]: [id number] Attending: PriLora Havensferring [name]: Randy SriLesleigh [name]: 05/20/2020 Duration: 24.49 mins Patient history: 64 6ar old male with altered mental status.  EEG to evaluate for seizures. Level of alertness: lethargic AEDs during EEG study: None Technical aspects: This EEG study was done with scalp electrodes positioned according to the 10-20 International system of electrode placement. Electrical activity was acquired at a sampling rate of '500Hz'  and reviewed with a high frequency filter of '70Hz'  and a low frequency filter of '1Hz' . EEG data were recorded continuously and digitally stored. Description: EEG showed continuous generalized, maximal bifrontal 2 to 3 Hz delta slowing with overriding 13 to 15 Hz beta activity. Hyperventilation and photic stimulation were not performed.   ABNORMALITY -Continuous slow,  generalized and maximal bifrontal IMPRESSION: This study is suggestive of moderate to severe diffuse encephalopathy, nonspecific etiology. No seizures or epileptiform discharges were seen throughout the recording. [name]   CT HEAD WO CONTRAST  Result Date: 05/19/2020 CLINICAL DATA:  Stroke follow-up EXAM: CT HEAD WITHOUT CONTRAST TECHNIQUE: Contiguous axial  images were obtained from the base of the skull through the vertex without intravenous contrast. COMPARISON:  05/19/2020 at 4:04 p.m. FINDINGS: Brain: Unchanged appearance of left PCA territory infarct. No acute hemorrhage. There is periventricular hypoattenuation compatible with chronic microvascular disease. Size and configuration of the CSF spaces are unchanged. Vascular: No abnormal hyperdensity of the major intracranial arteries or dural venous sinuses. No intracranial atherosclerosis. Skull: The visualized skull base, calvarium and extracranial soft tissues are normal. Sinuses/Orbits: Opacification of both frontal sinuses. The orbits are normal. IMPRESSION: Unchanged appearance of left PCA territory infarct without acute hemorrhage or mass effect. Electronically Signed   By: [name] M.D.   On: 05/19/2020 20:49   CT HEAD WO CONTRAST  Result Date: 05/19/2020 CLINICAL DATA:  Altered mental status. EXAM: CT HEAD WITHOUT CONTRAST TECHNIQUE: Contiguous axial images were obtained from the base of the skull through the vertex without intravenous contrast. COMPARISON:  May 22, 2018. FINDINGS: Brain: Left occipital low density is noted consistent with acute infarction. Mild chronic ischemic white matter disease is noted. No definite hemorrhage is noted. No midline shift is noted. Ventricular size is within normal limits. Vascular: No hyperdense vessel or unexpected calcification. Skull: Normal. Negative for fracture or focal lesion. Sinuses/Orbits: Bilateral frontal and ethmoid sinusitis is noted. Other: None. IMPRESSION: Acute left occipital infarction. MRI is recommended for further evaluation. Electronically Signed   By: Marijo Conception M.D.   On: 05/19/2020 16:25   MR ANGIO HEAD WO CONTRAST  Result Date: 05/20/2020 CLINICAL DATA:  Stroke follow-up. COVID patient with new onset altered mental status yesterday. EXAM: MRI HEAD WITHOUT CONTRAST MRA HEAD WITHOUT CONTRAST TECHNIQUE: Multiplanar,  multiecho pulse sequences of the brain and surrounding structures were obtained without intravenous contrast. Angiographic images of the head were obtained using MRA technique without contrast. COMPARISON:  CT head 05/19/2020 FINDINGS: MRI HEAD FINDINGS Brain: Numerous infratentorial and supratentorial acute infarcts involving all lobes, bilateral deep gray nuclei, and bilateral cerebellar hemispheres. Additionally, there is a small acute infarct in the pons. Some of the on infarcts are cortical and some are subcortical. The most confluent infarcts involves the left occipital lobe (left PCA territory). Edema associated with these infarcts without substantial mass effect. There is local/sulcal effacement involving the left PCA territory infarct without midline shift. No evidence of acute mass-occupying hemorrhagic transformation. No hydrocephalus. No mass lesion. No extra-axial fluid collection. Skull and upper cervical spine: Heterogeneous marrow signal without focal marrow abnormality. Sinuses/Orbits: Scattered paranasal sinus mucosal thickening, greatest in the ethmoid and frontal sinuses. Other: Trace right mastoid effusion. MRA HEAD FINDINGS Limited study.  Within this limitation: Anterior circulation: Bilateral internal carotid arteries are patent. Bilateral M1 MCAs are patent. Proximal M2 branches are patent with limited visualization distally. Dominant left A1 ACA with hypoplastic or absent right A1 ACA. Left A1 and bilateral A2 branches are patent proximally with limited visualization of the more distal ACA branches. No visible aneurysm. Posterior circulation: Distal intradural vertebral arteries and basilar artery are patent. Bilateral P1 PCAs are patent. Evaluation of the more distal PCA arteries is limited; however, poor flow related signal in bilateral P2 PCAs suggests possible underlying stenosis. Possible severe stenosis versus occlusion of  the more distal left P2 PCA. No visible aneurysm. IMPRESSION:  1. Numerous infratentorial and supratentorial acute infarcts involving all lobes bilaterally, bilateral deep gray nuclei, and bilateral cerebellar hemispheres. Additionally, there is a small acute infarct in the pons. The most confluent infarcts involves the left occipital lobe (left PCA territory). Overall pattern of infarcts is suggestive of a central embolic etiology, although some of the infarcts are in a watershed distribution. 2. The MRA portion of the exam is limited, but there is poor flow related signal in bilateral P2 PCAs suggesting possible underlying stenosis. Additionally, there is possible severe stenosis versus occlusion of a distal left P2 PCA branch. ACTA could better characterize if clinically indicated. 3. Nonspecific paranasal sinus mucosal thickening. These results will be called to the ordering clinician or representative by the Radiologist Assistant, and communication documented in the PACS or [employer]. Electronically Signed   By: Margaretha Sheffield MD   On: 05/20/2020 13:44   MR BRAIN WO CONTRAST  Result Date: 05/20/2020 CLINICAL DATA:  Stroke follow-up. COVID patient with new onset altered mental status yesterday. EXAM: MRI HEAD WITHOUT CONTRAST MRA HEAD WITHOUT CONTRAST TECHNIQUE: Multiplanar, multiecho pulse sequences of the brain and surrounding structures were obtained without intravenous contrast. Angiographic images of the head were obtained using MRA technique without contrast. COMPARISON:  CT head 05/19/2020 FINDINGS: MRI HEAD FINDINGS Brain: Numerous infratentorial and supratentorial acute infarcts involving all lobes, bilateral deep gray nuclei, and bilateral cerebellar hemispheres. Additionally, there is a small acute infarct in the pons. Some of the on infarcts are cortical and some are subcortical. The most confluent infarcts involves the left occipital lobe (left PCA territory). Edema associated with these infarcts without substantial mass effect. There is  local/sulcal effacement involving the left PCA territory infarct without midline shift. No evidence of acute mass-occupying hemorrhagic transformation. No hydrocephalus. No mass lesion. No extra-axial fluid collection. Skull and upper cervical spine: Heterogeneous marrow signal without focal marrow abnormality. Sinuses/Orbits: Scattered paranasal sinus mucosal thickening, greatest in the ethmoid and frontal sinuses. Other: Trace right mastoid effusion. MRA HEAD FINDINGS Limited study.  Within this limitation: Anterior circulation: Bilateral internal carotid arteries are patent. Bilateral M1 MCAs are patent. Proximal M2 branches are patent with limited visualization distally. Dominant left A1 ACA with hypoplastic or absent right A1 ACA. Left A1 and bilateral A2 branches are patent proximally with limited visualization of the more distal ACA branches. No visible aneurysm. Posterior circulation: Distal intradural vertebral arteries and basilar artery are patent. Bilateral P1 PCAs are patent. Evaluation of the more distal PCA arteries is limited; however, poor flow related signal in bilateral P2 PCAs suggests possible underlying stenosis. Possible severe stenosis versus occlusion of the more distal left P2 PCA. No visible aneurysm. IMPRESSION: 1. Numerous infratentorial and supratentorial acute infarcts involving all lobes bilaterally, bilateral deep gray nuclei, and bilateral cerebellar hemispheres. Additionally, there is a small acute infarct in the pons. The most confluent infarcts involves the left occipital lobe (left PCA territory). Overall pattern of infarcts is suggestive of a central embolic etiology, although some of the infarcts are in a watershed distribution. 2. The MRA portion of the exam is limited, but there is poor flow related signal in bilateral P2 PCAs suggesting possible underlying stenosis. Additionally, there is possible severe stenosis versus occlusion of a distal left P2 PCA branch. ACTA could  better characterize if clinically indicated. 3. Nonspecific paranasal sinus mucosal thickening. These results will be called to the ordering clinician or representative by the Radiologist Assistant,  and communication documented in the PACS or [employer]. Electronically Signed   By: Margaretha Sheffield MD   On: 05/20/2020 13:44   US Renal Transplant w/Doppler  Result Date: 05/20/2020 CLINICAL DATA:  Acute kidney injury EXAM: ULTRASOUND OF RENAL TRANSPLANT WITH RENAL DOPPLER ULTRASOUND TECHNIQUE: Ultrasound examination of the renal transplant was performed with gray-scale, color and duplex doppler evaluation. COMPARISON:  CT dated April 06, 2014 FINDINGS: Transplant kidney location: RLQ Transplant Kidney: Renal measurements: 12.5 x 5.9 x 6.9 cm = volume: 246m. Normal in size and parenchymal echogenicity. No evidence of mass or hydronephrosis. No peri-transplant fluid collection seen. Color flow in the main renal artery:  Yes Color flow in the main renal vein:  Yes Duplex Doppler Evaluation: Main Renal Artery Velocity: 31 cm/sec Main Renal Artery Resistive Index: 0.63 Venous waveform in main renal vein:  Present Intrarenal resistive index in upper pole:  0.57 (normal 0.6-0.8; equivocal 0.8-0.9; abnormal >= 0.9) Intrarenal resistive index in lower pole: 0.58 (normal 0.6-0.8; equivocal 0.8-0.9; abnormal >= 0.9) Bladder: Normal for degree of bladder distention. Other findings:  None. IMPRESSION: 1. Right lower quadrant transplant kidney in place. There is no acute abnormality. No hydronephrosis. 2. Borderline low resistive indices. Findings can be seen with transplant artery stenosis. Comparison to prior outside transplant ultrasound is recommended. Electronically Signed   By: CConstance HolsterM.D.   On: 05/20/2020 20:55   DG CHEST PORT 1 VIEW  Result Date: 05/21/2020 CLINICAL DATA:  COVID. EXAM: PORTABLE CHEST 1 VIEW COMPARISON:  05/18/2020. FINDINGS: Feeding tube noted with tip below left  hemidiaphragm. Stable borderline cardiomegaly. Diffuse bilateral interstitial infiltrates/edema. Interim improvement from prior exam. No pleural effusion. No pneumothorax. Degenerative change thoracic spine. IMPRESSION: 1. Feeding tube noted with tip below left hemidiaphragm. 2. Stable borderline cardiomegaly. 3. Diffuse bilateral interstitial infiltrates/edema, improved from prior exam. Electronically Signed   By: [place]ugar Land  On: 05/21/2020 06:55   VAS [place]OTID  Result Date: 05/21/2020 Carotid Arterial Duplex Study Indications:       CVA. Risk Factors:      Hypertension, hyperlipidemia, Diabetes. Limitations        Today's exam was limited due to the patient's respiratory                    variation, the patient's inability or unwillingness to                    cooperate and Patient positioning. Comparison Study:  No prior studies. Performing Technologist: GOliver HumRVT  Examination Guidelines: A complete evaluation includes B-mode imaging, spectral Doppler, color Doppler, and power Doppler as needed of all accessible portions of each vessel. Bilateral testing is considered an integral part of a complete examination. Limited examinations for reoccurring indications may be performed as noted.  Right Carotid Findings: +----------+--------+--------+--------+------------------+--------+           PSV cm/sEDV cm/sStenosisPlaque DescriptionComments +----------+--------+--------+--------+------------------+--------+ CCA Prox  60      1                                 tortuous +----------+--------+--------+--------+------------------+--------+ CCA Distal57      1                                          +----------+--------+--------+--------+------------------+--------+ ICA Prox  20  2                                 tortuous +----------+--------+--------+--------+------------------+--------+ ICA Distal37      2                                 tortuous  +----------+--------+--------+--------+------------------+--------+ ECA       71      0                                          +----------+--------+--------+--------+------------------+--------+ +----------+--------+-------+--------+-------------------+           PSV cm/sEDV cmsDescribeArm Pressure (mmHG) +----------+--------+-------+--------+-------------------+ [id number]                                        +----------+--------+-------+--------+-------------------+ +---------+--------+--+--------+-+--------------+ VertebralPSV cm/s29EDV cm/s4High resistant +---------+--------+--+--------+-+--------------+  Left Carotid Findings: +----------+--------+--------+--------+-----------------------+--------+           PSV cm/sEDV cm/sStenosisPlaque Description     Comments +----------+--------+--------+--------+-----------------------+--------+ CCA Prox  73      1               smooth and heterogenous         +----------+--------+--------+--------+-----------------------+--------+ CCA Distal46      1               smooth and heterogenous         +----------+--------+--------+--------+-----------------------+--------+ ICA Prox  27      6                                      tortuous +----------+--------+--------+--------+-----------------------+--------+ ICA Distal33      6                                      tortuous +----------+--------+--------+--------+-----------------------+--------+ ECA       38      6                                               +----------+--------+--------+--------+-----------------------+--------+ +----------+--------+--------+--------+-------------------+           PSV cm/sEDV cm/sDescribeArm Pressure (mmHG) +----------+--------+--------+--------+-------------------+ [id number]                                          +----------+--------+--------+--------+-------------------+  +---------+--------+--+--------+-+--------------+ VertebralPSV cm/s43EDV cm/s4High resistant +---------+--------+--+--------+-+--------------+   Summary: Right Carotid: Velocities in the right ICA are consistent with a 1-39% stenosis.                High resistant waveforms are noted throughout the carotid system. Left Carotid: Velocities in the left ICA are consistent with a 1-39% stenosis.               High resistant waveforms are noted throughout the carotid system. Vertebrals: Bilateral vertebral arteries demonstrate high resistant  flow. *See table(s) above for measurements and observations.  Electronically signed by Antony Contras MD on 05/21/2020 at 8:17:09 AM.    Final    ECHOCARDIOGRAM LIMITED  Result Date: 05/20/2020    ECHOCARDIOGRAM LIMITED REPORT   Patient Name:   [name] Date of Exam: 05/20/2020 Medical Rec #:  [id number]     Height:       71.0 in Accession #:    [id number]    Weight:       175.5 lb Date of Birth:  10/18/1955     BSA:          1.995 m Patient Age:    65 years      BP:           142/89 mmHg Patient Gender: M             HR:           100 bpm. Exam Location:  Inpatient Procedure: Limited Echo, Cardiac Doppler, Color Doppler and Intracardiac            Opacification Agent Indications:    Stroke 434.91  History:        Patient has prior history of Echocardiogram examinations, most                 recent 09/24/2017. CHF; Risk Factors:Hypertension, Diabetes and                 Non-Smoker. GERD.  Sonographer:    Vickie Epley RDCS Referring Phys: [id number]  Sonographer Comments: Image acquisition challenging due to respiratory motion. Covid positive. IMPRESSIONS  1. Left ventricular ejection fraction, by estimation, is 55 to 60%. The left ventricle has normal function. There is mild left ventricular hypertrophy. Left ventricular diastolic parameters are indeterminate.  2. Right ventricular systolic function is normal. The right ventricular size is normal. Tricuspid  regurgitation signal is inadequate for assessing PA pressure.  3. The mitral valve is normal in structure. Trivial mitral valve regurgitation. No evidence of mitral stenosis.  4. The aortic valve is tricuspid. Aortic valve regurgitation is mild. Mild aortic valve sclerosis is present, with no evidence of aortic valve stenosis.  5. Aortic dilatation noted. There is mild dilatation of the aortic root, measuring 40 mm.  6. The inferior vena cava is normal in size with greater than 50% respiratory variability, suggesting right atrial pressure of 3 mmHg.  7. The patient is in atrial fibrillation. FINDINGS  Left Ventricle: Left ventricular ejection fraction, by estimation, is 55 to 60%. The left ventricle has normal function. Definity contrast agent was given IV to delineate the left ventricular endocardial borders. The left ventricular internal cavity size was normal in size. There is mild left ventricular hypertrophy. Left ventricular diastolic parameters are indeterminate. Right Ventricle: The right ventricular size is normal. No increase in right ventricular wall thickness. Right ventricular systolic function is normal. Tricuspid regurgitation signal is inadequate for assessing PA pressure. Left Atrium: Left atrial size was normal in size. Right Atrium: Right atrial size was normal in size. Pericardium: There is no evidence of pericardial effusion. Mitral Valve: The mitral valve is normal in structure. Trivial mitral valve regurgitation. No evidence of mitral valve stenosis. Aortic Valve: The aortic valve is tricuspid. Aortic valve regurgitation is mild. Aortic regurgitation PHT measures 446 msec. Mild aortic valve sclerosis is present, with no evidence of aortic valve stenosis. Aorta: Aortic dilatation noted. There is mild dilatation of the aortic root, measuring 40 mm. Venous:  The inferior vena cava is normal in size with greater than 50% respiratory variability, suggesting right atrial pressure of 3 mmHg.  IAS/Shunts: No atrial level shunt detected by color flow Doppler. LEFT VENTRICLE PLAX 2D LVIDd:         4.10 cm LVIDs:         3.00 cm LV PW:         1.30 cm LV IVS:        1.30 cm LVOT diam:     2.10 cm LVOT Area:     3.46 cm  LEFT ATRIUM         Index LA diam:    3.50 cm 1.75 cm/m  AORTIC VALVE AI PHT:      446 msec  AORTA Ao Root diam: 4.00 cm Ao Asc diam:  3.50 cm  SHUNTS Systemic Diam: 2.10 cm Loralie Champagne MD Electronically signed by Loralie Champagne MD Signature Date/Time: 05/20/2020/5:28:00 PM    Final     Labs: BMET Recent Labs  Lab 05/15/20 0155 05/16/20 0200 05/18/20 0244 05/19/20 1356 05/19/20 1627 05/20/20 0714 05/20/20 1853 05/21/20 0455  NA 130* 135 141 146*  --  149*  --  150*  K 5.4* 4.5 5.5* 4.6  --  4.6  --  4.7  CL 100 106 108 116*  --  119*  --  119*  CO2 18* 18* 23 11*  --  15*  --  14*  GLUCOSE 349* 162* 282* 203*  --  247*  --  308*  BUN 80* 73* 61* 94*  --  124*  --  136*  CREATININE 2.80* 2.11* 1.91* 2.39*  --  2.87*  --  2.83*  CALCIUM 8.9 9.0 9.8 9.7  --  9.3  --  9.0  PHOS 4.2 2.8 3.9  --  5.5* 6.5* 4.8*  --    CBC Recent Labs  Lab 05/15/20 0155 05/16/20 0200 05/18/20 0244 05/19/20 1356 05/20/20 0714  WBC 8.5 10.4 13.8* 18.0* 18.4*  NEUTROABS 7.9* 9.7* 12.4*  --   --   HGB 14.4 14.8 15.9 18.6* 17.1*  HCT 42.0 43.1 48.5 55.2* 54.4*  MCV 77.5* 76.1* 79.8* 78.0* 80.8  PLT 224 222 228 177 178    Medications:    . vitamin C  500 mg Per Tube Daily  . aspirin  325 mg Per Tube Daily  . atorvastatin  40 mg Per Tube Daily  . carvedilol  25 mg Per Tube BID WC  . chlorhexidine  15 mL Mouth Rinse BID  . chlorhexidine gluconate (MEDLINE KIT)  15 mL Mouth Rinse BID  . Chlorhexidine Gluconate Cloth  6 each Topical Daily  . docusate  100 mg Per Tube BID  . etomidate      . feeding supplement (PROSource TF)  45 mL Per Tube BID  . fentaNYL      . fentaNYL (SUBLIMAZE) injection  50 mcg Intravenous Once  . free water  200 mL Per Tube Q4H  . insulin aspart   0-20 Units Subcutaneous Q4H  . insulin glargine  35 Units Subcutaneous Daily  . mouth rinse  15 mL Mouth Rinse BID  . mouth rinse  15 mL Mouth Rinse q12n4p  . mouth rinse  15 mL Mouth Rinse 10 times per day  . midazolam      . NIFEdipine  90 mg Oral Daily  . pantoprazole sodium  40 mg Per Tube Daily  . polyethylene glycol  17 g Per Tube Daily  .  predniSONE  40 mg Per Tube Q breakfast  . rocuronium bromide      . senna-docusate  1 tablet Per Tube QHS  . sodium bicarbonate      . sodium bicarbonate  1,300 mg Per Tube TID  . tacrolimus  1 mg Oral BID  . zinc sulfate  220 mg Per Tube Daily      Cordelia Bessinger  05/21/2020, 1:21 PM

## 2020-05-21 NOTE — Progress Notes (Signed)
Spoke to wife at bedside and eldest daughter via speaker phone. They understand we have nothing else to offer.  All questions answered.  Plan Cont current epi gtt Cont current norepi Cont full vent support  No further escalation  Full DNR  Family making arrangements to say their goodbyes.    Erick Colace ACNP-BC [place] Pager # [phone] OR # [phone] if no answer

## 2020-05-21 NOTE — Progress Notes (Signed)
Called to room pt unresponsive. Agonal resp status. Hypotensive and pre-arrest. We pushed amp bicarb given underlying progressive acidosis, hang norepi gtt for hypotension, pre-oxygenated w/ BVM ventilation and proceeded w/ intubation.   Exam  General critically ill 65 year old male unresponsive on vent HENT NCAT orally intubated now w/ Right IJ CVL. PERRL. Old bloody oral secretions Pulm equal bilateral breath sounds. Dec bases.  Card RRR abd soft  Ext warm and dry  Neuro unresponsive  Impression/plan Acute hypoxic respiratory failure 2/2 covid PNA and evolving ARDS. Can't exclude aspiration given encephalopathy leading up to event ->s/p actemera  Plan Full vent support-->started at 60ml/kg/pbw w/ goal to dec to 18ml once we  Know we can safely meet acid base goals pao2 goal > 55 Sat goal > 88 Titrate PEEP/FIO2  abg s/p 1 hr -->consider prone position if P/F ratio < 150 PRN sedation. RASS goal -2 to -3 VAP bundle  Will get resp culture  Day 8 systemic steroids (see below)  Start empiric zosyn   Severe sepsis/septic shock -suspect that there is now aspiration PNA superimposed on COVID PNA Plan Stop all antihypertensives CVL placed. Will ck lactic acid and CVP Ensure euvolemic and also MAP > 65 As has been on steroids we have changed steroid to stress dose solucortef 50 q 6 abx as above Tele   Acute metabolic encephalopathy superimposed and complicated by acute bilateral embolic strokes 2/2 afib off AC and hypercoagulable due to covid Plan Cont rate control Off heparin given acute GIB-->Will have to re-evaluate this  Cont tele    CAF Plan Tele  Holding coreg and cont amiodarone   Acute GIB (looks like UGIB)  hgb not really that impressive but we had held [place] Repeat serial [place] PPI to BID May be able to resume AC in am if stable  Acute on CKD s/p prior transplant  Plan Trend chem Renal dose meds Keep euvolemic Strict I&O  Fluid and electrolyte  imbalance w/ Hypernatremia, hyperchloremia, and anion-gap metabolic acidosis  Plan Bicarb ggt for now to temporize Cont also w/ hypotonic resuscitation  F/u abg  Cont free water replacement Serial chemistries   All other issues as addressed   My cct 47m minutes  Erick Colace ACNP-BC [place] Pager # [phone] OR # [phone] if no answer

## 2020-05-21 NOTE — Progress Notes (Signed)
Attempted to wean off Precedex, patient became tachypneic respiratory rate 30-34, resumed precedex at 0.13mcg/kg/hr.

## 2020-05-21 NOTE — Progress Notes (Signed)
[facility] Progress Note Patient Name: SAGE KOPERA DOB: 1955/05/25 MRN: [id number]   Date of Service  05/21/2020  HPI/Events of Note  Agitation   eICU Interventions  Plan: 1. Restart Precedex IV infusion. Titrate to RASS = 0.     Intervention Category Major Interventions: Delirium, psychosis, severe agitation - evaluation and management  Lysle Dingwall 05/21/2020, 7:55 PM

## 2020-05-21 NOTE — Progress Notes (Signed)
Notified E-link of persistent tachypnea, respiratory rate 34-38. Awaiting CXR.

## 2020-05-21 NOTE — Procedures (Signed)
Patient Name: [name]  MRN: [id number]  Epilepsy Attending: Lora Havens  Referring Physician/Provider: Dr Lesleigh Noe Date: 05/20/2020 Duration: 24.49 mins  Patient history: 65 year old male with altered mental status.  EEG to evaluate for seizures.  Level of alertness: lethargic  AEDs during EEG study: None  Technical aspects: This EEG study was done with scalp electrodes positioned according to the 10-20 International system of electrode placement. Electrical activity was acquired at a sampling rate of 500Hz  and reviewed with a high frequency filter of 70Hz  and a low frequency filter of 1Hz . EEG data were recorded continuously and digitally stored.   Description: EEG showed continuous generalized, maximal bifrontal 2 to 3 Hz delta slowing with overriding 13 to 15 Hz beta activity. Hyperventilation and photic stimulation were not performed.     ABNORMALITY -Continuous slow, generalized and maximal bifrontal  IMPRESSION: This study is suggestive of moderate to severe diffuse encephalopathy, nonspecific etiology. No seizures or epileptiform discharges were seen throughout the recording.  Kenadie Royce Barbra Sarks

## 2020-05-21 NOTE — Code Documentation (Signed)
CODE BLUE NOTE  Patient Name: [name]   MRN: [id number]   Date of Birth/ Sex: [birth year] , male      Admission Date: 05/23/2020  Attending Provider: Marshell Garfinkel, MD  Primary Diagnosis: Pneumonia due to COVID-19 virus    Indication: Pt was in his usual state of health until this PM, when he was noted to be PEA arrest. Code blue was subsequently called. At the time of arrival on scene, ACLS protocol was underway.    Technical Description:  - CPR performance duration:  ongoing   - Was defibrillation or cardioversion used? Yes   - Was external pacer placed? No  - Was patient intubated pre/post CPR? Yes (pre)    Medications Administered: Y = Yes; Blank = No Amiodarone  Y  Atropine    Calcium    Epinephrine  Y  Lidocaine    Magnesium    Norepinephrine    Phenylephrine    Sodium bicarbonate  Y  Vasopressin      Post CPR evaluation:  - Final Status - Was patient successfully resuscitated ? Pending, primary team took over - What is current rhythm? PEA - What is current hemodynamic status? Unstable   Miscellaneous Information:  - Labs sent, including: ABG, BMP, CBC  - Primary team notified?  Yes  - Family Notified? Yes, wife on the way  - Additional notes/ transfer status: Arrived at the scene, ACLS underway for initial PEA rhythm, multiple rounds of CPR and epinephrine given, primary team was notified and resumed the remainder of the code        Zola Button, MD  05/21/2020, 3:32 PM

## 2020-05-21 NOTE — Progress Notes (Signed)
STROKE TEAM PROGRESS NOTE   SUBJECTIVE (INTERVAL HISTORY) No family is at bedside.  Patient still on NRB with tachypnea, increased WOB and SOB. RR 40s.  However, he was able to open eyes on pain stimulation, however not follow commands and nonverbal.  Not moving extremities much even with pain examination.  Per RN, patient had 2 tardy bowel movement, concerning for GI bleeding.  Currently still on heparin IV, yesterday hemoglobin 17.1, will check hemoglobin today.   OBJECTIVE Temp:  [97 F (36.1 C)-98.3 F (36.8 C)] 98.3 F (36.8 C) (01/12 0730) Pulse Rate:  [87-135] 111 (01/12 1200) Cardiac Rhythm: Atrial fibrillation (01/12 1200) Resp:  [13-46] 46 (01/12 1200) BP: (88-175)/(53-112) 88/54 (01/12 1200) SpO2:  [87 %-98 %] 93 % (01/12 1200) FiO2 (%):  [80 %-100 %] 90 % (01/12 1200) Weight:  [79.9 kg] 79.9 kg (01/12 0314)  Recent Labs  Lab 05/20/20 2328 05/21/20 0103 05/21/20 0328 05/21/20 0747 05/21/20 1207  GLUCAP 390* 341* 300* 179* 171*   Recent Labs  Lab 05/16/20 0200 05/18/20 0244 05/19/20 1356 05/19/20 1627 05/20/20 0714 05/20/20 1853 05/21/20 0455  NA 135 141 146*  --  149*  --  150*  K 4.5 5.5* 4.6  --  4.6  --  4.7  CL 106 108 116*  --  119*  --  119*  CO2 18* 23 11*  --  15*  --  14*  GLUCOSE 162* 282* 203*  --  247*  --  308*  BUN 73* 61* 94*  --  124*  --  136*  CREATININE 2.11* 1.91* 2.39*  --  2.87*  --  2.83*  CALCIUM 9.0 9.8 9.7  --  9.3  --  9.0  MG 2.6* 2.7*  --  3.2* 3.2* 3.0* 3.2*  PHOS 2.8 3.9  --  5.5* 6.5* 4.8*  --    Recent Labs  Lab 05/15/20 0155 05/16/20 0200 05/18/20 0244 05/19/20 1356  AST 22 23 35 34  ALT _0 ALKPHOS 108 108 147* 115  BILITOT 0.6 0.9 1.2 1.5*  PROT 6.2* 6.1* 6.5 6.5  ALBUMIN 2.4* 2.3* 2.5* 2.5*   Recent Labs  Lab 05/15/20 0155 05/16/20 0200 05/18/20 0244 05/19/20 1356 05/20/20 0714  WBC 8.5 10.4 13.8* 18.0* 18.4*  NEUTROABS 7.9* 9.7* 12.4*  --   --   HGB 14.4 14.8 15.9 18.6* 17.1*  HCT 42.0  43.1 48.5 55.2* 54.4*  MCV 77.5* 76.1* 79.8* 78.0* 80.8  PLT 224 222 228 177 178   No results for input(s): CKTOTAL, CKMB, CKMBINDEX, TROPONINI in the last 168 hours. No results for input(s): LABPROT, INR in the last 72 hours. Recent Labs    05/20/20 1545  COLORURINE YELLOW  LABSPEC 1.017  PHURINE 5.0  GLUCOSEU NEGATIVE  HGBUR SMALL*  BILIRUBINUR NEGATIVE  KETONESUR NEGATIVE  PROTEINUR 100*  NITRITE NEGATIVE  LEUKOCYTESUR NEGATIVE       Component Value Date/Time   CHOL 217 (H) 05/20/2020 0714   CHOL 138 08/01/2019 0934   TRIG 280 (H) 05/20/2020 0714   HDL 43 05/20/2020 0714   HDL 41 08/01/2019 0934   CHOLHDL 5.0 05/20/2020 0714   VLDL 56 (H) 05/20/2020 0714   LDLCALC 118 (H) 05/20/2020 0714   LDLCALC 72 08/01/2019 0934   Lab Results  Component Value Date   HGBA1C 8.0 (H) 05/20/2020      Component Value Date/Time   LABOPIA POSITIVE (A) 01/29/2019 1538   COCAINSCRNUR NONE DETECTED 01/29/2019 1538   LABBENZ  NONE DETECTED 01/29/2019 1538   AMPHETMU NONE DETECTED 01/29/2019 1538   THCU NONE DETECTED 01/29/2019 1538   LABBARB NONE DETECTED 01/29/2019 1538    No results for input(s): ETH in the last 168 hours.  I have personally reviewed the radiological images below and agree with the radiology interpretations.  EEG  Result Date: 05/21/2020 Lora Havens, MD     05/21/2020  8:18 AM Patient Name: [name] MRN: [id number] Epilepsy Attending: Lora Havens Referring Physician/Provider: Dr Lesleigh Noe Date: 05/20/2020 Duration: 24.49 mins Patient history: 65 year old male with altered mental status.  EEG to evaluate for seizures. Level of alertness: lethargic AEDs during EEG study: None Technical aspects: This EEG study was done with scalp electrodes positioned according to the 10-20 International system of electrode placement. Electrical activity was acquired at a sampling rate of 500Hz and reviewed with a high frequency filter of 70Hz and a low frequency filter  of 1Hz. EEG data were recorded continuously and digitally stored. Description: EEG showed continuous generalized, maximal bifrontal 2 to 3 Hz delta slowing with overriding 13 to 15 Hz beta activity. Hyperventilation and photic stimulation were not performed.   ABNORMALITY -Continuous slow, generalized and maximal bifrontal IMPRESSION: This study is suggestive of moderate to severe diffuse encephalopathy, nonspecific etiology. No seizures or epileptiform discharges were seen throughout the recording. Lora Havens   DG Lumbar Spine Complete W/Bend  Result Date: 04/30/2020 CLINICAL DATA:  Chronic low back pain EXAM: LUMBAR SPINE - COMPLETE WITH BENDING VIEWS COMPARISON:  12/02/2009 FINDINGS: Frontal, bilateral oblique, lateral neutral, lateral flexion, and lateral extension views of the lumbar spine are obtained. There are 5 non-rib-bearing lumbar type vertebral bodies in normal alignment. No instability with flexion or extension. No fractures. Moderate spondylosis at L3-4 and L4-5. Sacroiliac joints are normal. IMPRESSION: 1. Lower lumbar spondylosis.  No acute bony abnormality. Electronically Signed   By: Randa Ngo M.D.   On: 04/30/2020 23:33   DG Abd 1 View  Result Date: 05/18/2020 CLINICAL DATA:  65 year old male with abdominal distension. EXAM: ABDOMEN - 1 VIEW COMPARISON:  Abdominal radiograph dated 02/02/2019. FINDINGS: Postsurgical changes of bowel with anastomotic suture in the left lower abdomen. There is large amount of stool throughout the colon. No bowel dilatation or evidence of obstruction. No free air or radiopaque calculi. The osseous structures are intact. Partially visualized bilateral pulmonary opacities. IMPRESSION: 1. Constipation. No bowel obstruction. 2. Bilateral pulmonary opacities. Electronically Signed   By: Anner Crete M.D.   On: 05/18/2020 18:31   CT HEAD WO CONTRAST  Result Date: 05/19/2020 CLINICAL DATA:  Stroke follow-up EXAM: CT HEAD WITHOUT CONTRAST  TECHNIQUE: Contiguous axial images were obtained from the base of the skull through the vertex without intravenous contrast. COMPARISON:  05/19/2020 at 4:04 p.m. FINDINGS: Brain: Unchanged appearance of left PCA territory infarct. No acute hemorrhage. There is periventricular hypoattenuation compatible with chronic microvascular disease. Size and configuration of the CSF spaces are unchanged. Vascular: No abnormal hyperdensity of the major intracranial arteries or dural venous sinuses. No intracranial atherosclerosis. Skull: The visualized skull base, calvarium and extracranial soft tissues are normal. Sinuses/Orbits: Opacification of both frontal sinuses. The orbits are normal. IMPRESSION: Unchanged appearance of left PCA territory infarct without acute hemorrhage or mass effect. Electronically Signed   By: Ulyses Jarred M.D.   On: 05/19/2020 20:49   CT HEAD WO CONTRAST  Result Date: 05/19/2020 CLINICAL DATA:  Altered mental status. EXAM: CT HEAD WITHOUT CONTRAST TECHNIQUE: Contiguous axial images were obtained  from the base of the skull through the vertex without intravenous contrast. COMPARISON:  May 22, 2018. FINDINGS: Brain: Left occipital low density is noted consistent with acute infarction. Mild chronic ischemic white matter disease is noted. No definite hemorrhage is noted. No midline shift is noted. Ventricular size is within normal limits. Vascular: No hyperdense vessel or unexpected calcification. Skull: Normal. Negative for fracture or focal lesion. Sinuses/Orbits: Bilateral frontal and ethmoid sinusitis is noted. Other: None. IMPRESSION: Acute left occipital infarction. MRI is recommended for further evaluation. Electronically Signed   By: Marijo Conception M.D.   On: 05/19/2020 16:25   MR ANGIO HEAD WO CONTRAST  Result Date: 05/20/2020 CLINICAL DATA:  Stroke follow-up. COVID patient with new onset altered mental status yesterday. EXAM: MRI HEAD WITHOUT CONTRAST MRA HEAD WITHOUT CONTRAST  TECHNIQUE: Multiplanar, multiecho pulse sequences of the brain and surrounding structures were obtained without intravenous contrast. Angiographic images of the head were obtained using MRA technique without contrast. COMPARISON:  CT head 05/19/2020 FINDINGS: MRI HEAD FINDINGS Brain: Numerous infratentorial and supratentorial acute infarcts involving all lobes, bilateral deep gray nuclei, and bilateral cerebellar hemispheres. Additionally, there is a small acute infarct in the pons. Some of the on infarcts are cortical and some are subcortical. The most confluent infarcts involves the left occipital lobe (left PCA territory). Edema associated with these infarcts without substantial mass effect. There is local/sulcal effacement involving the left PCA territory infarct without midline shift. No evidence of acute mass-occupying hemorrhagic transformation. No hydrocephalus. No mass lesion. No extra-axial fluid collection. Skull and upper cervical spine: Heterogeneous marrow signal without focal marrow abnormality. Sinuses/Orbits: Scattered paranasal sinus mucosal thickening, greatest in the ethmoid and frontal sinuses. Other: Trace right mastoid effusion. MRA HEAD FINDINGS Limited study.  Within this limitation: Anterior circulation: Bilateral internal carotid arteries are patent. Bilateral M1 MCAs are patent. Proximal M2 branches are patent with limited visualization distally. Dominant left A1 ACA with hypoplastic or absent right A1 ACA. Left A1 and bilateral A2 branches are patent proximally with limited visualization of the more distal ACA branches. No visible aneurysm. Posterior circulation: Distal intradural vertebral arteries and basilar artery are patent. Bilateral P1 PCAs are patent. Evaluation of the more distal PCA arteries is limited; however, poor flow related signal in bilateral P2 PCAs suggests possible underlying stenosis. Possible severe stenosis versus occlusion of the more distal left P2 PCA. No  visible aneurysm. IMPRESSION: 1. Numerous infratentorial and supratentorial acute infarcts involving all lobes bilaterally, bilateral deep gray nuclei, and bilateral cerebellar hemispheres. Additionally, there is a small acute infarct in the pons. The most confluent infarcts involves the left occipital lobe (left PCA territory). Overall pattern of infarcts is suggestive of a central embolic etiology, although some of the infarcts are in a watershed distribution. 2. The MRA portion of the exam is limited, but there is poor flow related signal in bilateral P2 PCAs suggesting possible underlying stenosis. Additionally, there is possible severe stenosis versus occlusion of a distal left P2 PCA branch. ACTA could better characterize if clinically indicated. 3. Nonspecific paranasal sinus mucosal thickening. These results will be called to the ordering clinician or representative by the Radiologist Assistant, and communication documented in the PACS or [employer]. Electronically Signed   By: Margaretha Sheffield MD   On: 05/20/2020 13:44   MR BRAIN WO CONTRAST  Result Date: 05/20/2020 CLINICAL DATA:  Stroke follow-up. COVID patient with new onset altered mental status yesterday. EXAM: MRI HEAD WITHOUT CONTRAST MRA HEAD WITHOUT CONTRAST TECHNIQUE: Multiplanar,  multiecho pulse sequences of the brain and surrounding structures were obtained without intravenous contrast. Angiographic images of the head were obtained using MRA technique without contrast. COMPARISON:  CT head 05/19/2020 FINDINGS: MRI HEAD FINDINGS Brain: Numerous infratentorial and supratentorial acute infarcts involving all lobes, bilateral deep gray nuclei, and bilateral cerebellar hemispheres. Additionally, there is a small acute infarct in the pons. Some of the on infarcts are cortical and some are subcortical. The most confluent infarcts involves the left occipital lobe (left PCA territory). Edema associated with these infarcts without substantial  mass effect. There is local/sulcal effacement involving the left PCA territory infarct without midline shift. No evidence of acute mass-occupying hemorrhagic transformation. No hydrocephalus. No mass lesion. No extra-axial fluid collection. Skull and upper cervical spine: Heterogeneous marrow signal without focal marrow abnormality. Sinuses/Orbits: Scattered paranasal sinus mucosal thickening, greatest in the ethmoid and frontal sinuses. Other: Trace right mastoid effusion. MRA HEAD FINDINGS Limited study.  Within this limitation: Anterior circulation: Bilateral internal carotid arteries are patent. Bilateral M1 MCAs are patent. Proximal M2 branches are patent with limited visualization distally. Dominant left A1 ACA with hypoplastic or absent right A1 ACA. Left A1 and bilateral A2 branches are patent proximally with limited visualization of the more distal ACA branches. No visible aneurysm. Posterior circulation: Distal intradural vertebral arteries and basilar artery are patent. Bilateral P1 PCAs are patent. Evaluation of the more distal PCA arteries is limited; however, poor flow related signal in bilateral P2 PCAs suggests possible underlying stenosis. Possible severe stenosis versus occlusion of the more distal left P2 PCA. No visible aneurysm. IMPRESSION: 1. Numerous infratentorial and supratentorial acute infarcts involving all lobes bilaterally, bilateral deep gray nuclei, and bilateral cerebellar hemispheres. Additionally, there is a small acute infarct in the pons. The most confluent infarcts involves the left occipital lobe (left PCA territory). Overall pattern of infarcts is suggestive of a central embolic etiology, although some of the infarcts are in a watershed distribution. 2. The MRA portion of the exam is limited, but there is poor flow related signal in bilateral P2 PCAs suggesting possible underlying stenosis. Additionally, there is possible severe stenosis versus occlusion of a distal left P2  PCA branch. ACTA could better characterize if clinically indicated. 3. Nonspecific paranasal sinus mucosal thickening. These results will be called to the ordering clinician or representative by the Radiologist Assistant, and communication documented in the PACS or [employer]. Electronically Signed   By: Margaretha Sheffield MD   On: 05/20/2020 13:44   US Renal Transplant w/Doppler  Result Date: 05/20/2020 CLINICAL DATA:  Acute kidney injury EXAM: ULTRASOUND OF RENAL TRANSPLANT WITH RENAL DOPPLER ULTRASOUND TECHNIQUE: Ultrasound examination of the renal transplant was performed with gray-scale, color and duplex doppler evaluation. COMPARISON:  CT dated April 06, 2014 FINDINGS: Transplant kidney location: RLQ Transplant Kidney: Renal measurements: 12.5 x 5.9 x 6.9 cm = volume: 274m. Normal in size and parenchymal echogenicity. No evidence of mass or hydronephrosis. No peri-transplant fluid collection seen. Color flow in the main renal artery:  Yes Color flow in the main renal vein:  Yes Duplex Doppler Evaluation: Main Renal Artery Velocity: 31 cm/sec Main Renal Artery Resistive Index: 0.63 Venous waveform in main renal vein:  Present Intrarenal resistive index in upper pole:  0.57 (normal 0.6-0.8; equivocal 0.8-0.9; abnormal >= 0.9) Intrarenal resistive index in lower pole: 0.58 (normal 0.6-0.8; equivocal 0.8-0.9; abnormal >= 0.9) Bladder: Normal for degree of bladder distention. Other findings:  None. IMPRESSION: 1. Right lower quadrant transplant kidney in place. There is  no acute abnormality. No hydronephrosis. 2. Borderline low resistive indices. Findings can be seen with transplant artery stenosis. Comparison to prior outside transplant ultrasound is recommended. Electronically Signed   By: Constance Holster M.D.   On: 05/20/2020 20:55   DG CHEST PORT 1 VIEW  Result Date: 05/21/2020 CLINICAL DATA:  COVID. EXAM: PORTABLE CHEST 1 VIEW COMPARISON:  05/18/2020. FINDINGS: Feeding tube noted with tip  below left hemidiaphragm. Stable borderline cardiomegaly. Diffuse bilateral interstitial infiltrates/edema. Interim improvement from prior exam. No pleural effusion. No pneumothorax. Degenerative change thoracic spine. IMPRESSION: 1. Feeding tube noted with tip below left hemidiaphragm. 2. Stable borderline cardiomegaly. 3. Diffuse bilateral interstitial infiltrates/edema, improved from prior exam. Electronically Signed   By: [place]   On: 05/21/2020 06:55   DG CHEST PORT 1 VIEW  Result Date: 05/18/2020 CLINICAL DATA:  Shortness of breath. EXAM: PORTABLE CHEST 1 VIEW COMPARISON:  Most recent radiograph 2 days ago 05/16/2020. FINDINGS: Similar cardiomegaly. Unchanged mediastinal contours with aortic atherosclerosis. Diffuse fine reticular and interstitial opacities, with slight improvement at the left lung base from prior. No definite progression. No pneumothorax or visualized pleural effusion. No evidence of pneumomediastinum. Stable osseous structures. IMPRESSION: Diffuse fine reticular and interstitial opacities, with slight improvement at the left lung base from prior, edema versus multifocal infection. Cardiomegaly is stable. Electronically Signed   By: Keith Rake M.D.   On: 05/18/2020 18:33   DG CHEST PORT 1 VIEW  Result Date: 05/16/2020 CLINICAL DATA:  Shortness of breath. COVID-19 positive. EXAM: PORTABLE CHEST 1 VIEW COMPARISON:  05/14/2020 FINDINGS: Stable enlarged cardiac silhouette. Increased patchy opacity in both lungs, most pronounced in the left lower lung zone. No visible pleural fluid. Thoracic spine degenerative changes. IMPRESSION: 1. Increased bilateral pneumonia and/or alveolar edema, most pronounced in the left lower lung zone. 2. Stable cardiomegaly. Electronically Signed   By: Claudie Revering M.D.   On: 05/16/2020 15:32   Portable chest 1 View  Result Date: 05/14/2020 CLINICAL DATA:  Dyspnea EXAM: PORTABLE CHEST 1 VIEW COMPARISON:  05/23/2020 FINDINGS: Pulmonary  insufflation is normal, symmetric, and stable since prior examination. Superimposed diffuse ground-glass pulmonary infiltrate and perihilar interstitial pulmonary edema appears stable in keeping with moderate pulmonary edema, likely cardiogenic in nature. Cardiac size is mildly enlarged, unchanged. No acute bone abnormality. No pneumothorax or pleural effusion. IMPRESSION: Stable diffuse ground-glass and perihilar interstitial pulmonary infiltrate most in keeping with moderate cardiogenic failure. Electronically Signed   By: Fidela Salisbury MD   On: 05/14/2020 04:53   DG Chest Port 1 View  Result Date: 05/21/2020 CLINICAL DATA:  Hypoxia. EXAM: PORTABLE CHEST 1 VIEW COMPARISON:  February 04, 2019. FINDINGS: Stable cardiomegaly. Bilateral patchy airspace opacities are noted concerning for multifocal pneumonia. No pneumothorax or pleural effusion is noted. Bony thorax is unremarkable. IMPRESSION: Bilateral patchy airspace opacities are noted concerning for multifocal pneumonia. Aortic Atherosclerosis (ICD10-I70.0). Electronically Signed   By: Marijo Conception M.D.   On: 06/06/2020 12:01   VAS US CAROTID  Result Date: 05/21/2020 Carotid Arterial Duplex Study Indications:       CVA. Risk Factors:      Hypertension, hyperlipidemia, Diabetes. Limitations        Today's exam was limited due to the patient's respiratory                    variation, the patient's inability or unwillingness to                    cooperate  and Patient positioning. Comparison Study:  No prior studies. Performing Technologist: Oliver Hum RVT  Examination Guidelines: A complete evaluation includes B-mode imaging, spectral Doppler, color Doppler, and power Doppler as needed of all accessible portions of each vessel. Bilateral testing is considered an integral part of a complete examination. Limited examinations for reoccurring indications may be performed as noted.  Right Carotid Findings:  +----------+--------+--------+--------+------------------+--------+           PSV cm/sEDV cm/sStenosisPlaque DescriptionComments +----------+--------+--------+--------+------------------+--------+ CCA Prox  60      1                                 tortuous +----------+--------+--------+--------+------------------+--------+ CCA Distal57      1                                          +----------+--------+--------+--------+------------------+--------+ ICA Prox  20      2                                 tortuous +----------+--------+--------+--------+------------------+--------+ ICA Distal37      2                                 tortuous +----------+--------+--------+--------+------------------+--------+ ECA       71      0                                          +----------+--------+--------+--------+------------------+--------+ +----------+--------+-------+--------+-------------------+           PSV cm/sEDV cmsDescribeArm Pressure (mmHG) +----------+--------+-------+--------+-------------------+ [id number]                                        +----------+--------+-------+--------+-------------------+ +---------+--------+--+--------+-+--------------+ VertebralPSV cm/s29EDV cm/s4High resistant +---------+--------+--+--------+-+--------------+  Left Carotid Findings: +----------+--------+--------+--------+-----------------------+--------+           PSV cm/sEDV cm/sStenosisPlaque Description     Comments +----------+--------+--------+--------+-----------------------+--------+ CCA Prox  73      1               smooth and heterogenous         +----------+--------+--------+--------+-----------------------+--------+ CCA Distal46      1               smooth and heterogenous         +----------+--------+--------+--------+-----------------------+--------+ ICA Prox  27      6                                      tortuous  +----------+--------+--------+--------+-----------------------+--------+ ICA Distal33      6                                      tortuous +----------+--------+--------+--------+-----------------------+--------+ ECA       38      6                                               +----------+--------+--------+--------+-----------------------+--------+ +----------+--------+--------+--------+-------------------+  PSV cm/sEDV cm/sDescribeArm Pressure (mmHG) +----------+--------+--------+--------+-------------------+ Subclavian88                                          +----------+--------+--------+--------+-------------------+ +---------+--------+--+--------+-+--------------+ VertebralPSV cm/s43EDV cm/s4High resistant +---------+--------+--+--------+-+--------------+   Summary: Right Carotid: Velocities in the right ICA are consistent with a 1-39% stenosis.                High resistant waveforms are noted throughout the carotid system. Left Carotid: Velocities in the left ICA are consistent with a 1-39% stenosis.               High resistant waveforms are noted throughout the carotid system. Vertebrals: Bilateral vertebral arteries demonstrate high resistant flow. *See table(s) above for measurements and observations.  Electronically signed by Antony Contras MD on 05/21/2020 at 8:17:09 AM.    Final    ECHOCARDIOGRAM LIMITED  Result Date: 05/20/2020    ECHOCARDIOGRAM LIMITED REPORT   Patient Name:   DUDLEY MAGES Salvato Date of Exam: 05/20/2020 Medical Rec #:  [id number]     Height:       71.0 in Accession #:    [id number]    Weight:       175.5 lb Date of Birth:  22-Dec-1955     BSA:          1.995 m Patient Age:    28 years      BP:           142/89 mmHg Patient Gender: M             HR:           100 bpm. Exam Location:  Inpatient Procedure: Limited Echo, Cardiac Doppler, Color Doppler and Intracardiac            Opacification Agent Indications:    Stroke 434.91  History:         Patient has prior history of Echocardiogram examinations, most                 recent 09/24/2017. CHF; Risk Factors:Hypertension, Diabetes and                 Non-Smoker. GERD.  Sonographer:    Vickie Epley RDCS Referring Phys: [id number] [place]  Sonographer Comments: Image acquisition challenging due to respiratory motion. Covid positive. IMPRESSIONS  1. Left ventricular ejection fraction, by estimation, is 55 to 60%. The left ventricle has normal function. There is mild left ventricular hypertrophy. Left ventricular diastolic parameters are indeterminate.  2. Right ventricular systolic function is normal. The right ventricular size is normal. Tricuspid regurgitation signal is inadequate for assessing PA pressure.  3. The mitral valve is normal in structure. Trivial mitral valve regurgitation. No evidence of mitral stenosis.  4. The aortic valve is tricuspid. Aortic valve regurgitation is mild. Mild aortic valve sclerosis is present, with no evidence of aortic valve stenosis.  5. Aortic dilatation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

## 2020-05-21 NOTE — Procedures (Signed)
Intubation Procedure Note  [name]  [id number]  [birth year]  Date:05/21/20  Time:1:42 PM   Provider Performing:Pete E Kary Kos    Procedure: Intubation (25053)  Indication(s) Respiratory Failure  Consent Unable to obtain consent due to emergent nature of procedure.  The patient had agonal respiratory efforts, sats 80s, unresponsive and hypotensive prior to intubation. In addition I gave 1 amp bicarb and started norepinephrine infusion.   Anesthesia Etomidate and Rocuronium   Time Out Verified patient identification, verified procedure, site/side was marked, verified correct patient position, special equipment/implants available, medications/allergies/relevant history reviewed, required imaging and test results available.   Sterile Technique Usual hand hygeine, masks, and gloves were used   Procedure Description Patient positioned in bed supine.  Sedation given as noted above.  Patient was intubated with endotracheal tube using Glidescope.  View was Grade 1 full glottis .  Number of attempts was 1.  Colorimetric CO2 detector was consistent with tracheal placement.   Complications/Tolerance None; patient tolerated the procedure well. Chest X-ray is ordered to verify placement.   EBL Minimal   Specimen(s) None  Erick Colace ACNP-BC [place] Pager # [phone] OR # [phone] if no answer

## 2020-05-21 NOTE — Progress Notes (Signed)
   05/21/20 1505  Clinical Encounter Type  Visited With Family  Visit Type Code  Referral From Nurse  Consult/Referral To Chaplain  Chaplain responded. The patient's family was not present. The patient's wife was called. Unit Director requested the Chaplain to meet Mrs. Feighner and escorted her to [facility] for consultation with Dr. Dorathy Daft. The patient's daughter was on the phone listening as Dr. Dorathy Daft consulted with Mrs. Buresh. The daughter lives in [place] and understands patient may past before she can get here. She was very appreciative of Dr. Rolla Flatten care of her father and detailed consultation.  The patient's wife declines to visit him because she is taking care of her elderly mother who has stage 4 cancer. Mrs. Mendonsa called the patient's children to visit. Mrs. Eller left to pick up their daughter and will return. Chaplain offered ministry of presence, comfort, and prayer. Chaplain remains available if needed. This note was prepared by Jeanine Luz, M.Div..  For questions please contact by phone [phone].

## 2020-05-21 NOTE — Progress Notes (Signed)
[facility] Progress Note Patient Name: [name] DOB: 1956/01/29 MRN: [id number]   Date of Service  05/21/2020  HPI/Events of Note  Hyperglycemia - Blood glucose = 390.  eICU Interventions  Plan: 1. Increase to Q 4 resistant Novolog SSI. If blood glucose still not controlled, will need insulin iV infusion.      Intervention Category Major Interventions: Hyperglycemia - active titration of insulin therapy  Lysle Dingwall 05/21/2020, 12:42 AM

## 2020-05-22 DIAGNOSIS — U071 COVID-19: Secondary | ICD-10-CM | POA: Diagnosis not present

## 2020-05-22 DIAGNOSIS — J1282 Pneumonia due to coronavirus disease 2019: Secondary | ICD-10-CM | POA: Diagnosis not present

## 2020-05-22 LAB — RENAL FUNCTION PANEL
Albumin: 1.3 g/dL — ABNORMAL LOW (ref 3.5–5.0)
Anion gap: 16 — ABNORMAL HIGH (ref 5–15)
BUN: 147 mg/dL — ABNORMAL HIGH (ref 8–23)
CO2: 24 mmol/L (ref 22–32)
Calcium: 6.9 mg/dL — ABNORMAL LOW (ref 8.9–10.3)
Chloride: 101 mmol/L (ref 98–111)
Creatinine, Ser: 3.8 mg/dL — ABNORMAL HIGH (ref 0.61–1.24)
GFR, Estimated: 17 mL/min — ABNORMAL LOW (ref >60)
Glucose, Bld: 129 mg/dL — ABNORMAL HIGH (ref 70–99)
Phosphorus: 5 mg/dL — ABNORMAL HIGH (ref 2.5–4.6)
Potassium: 5 mmol/L (ref 3.5–5.1)
Sodium: 141 mmol/L (ref 135–145)

## 2020-05-22 LAB — CBC
HCT: 37.9 % — ABNORMAL LOW (ref 39.0–52.0)
Hemoglobin: 12.3 g/dL — ABNORMAL LOW (ref 13.0–17.0)
MCH: 26.5 pg (ref 26.0–34.0)
MCHC: 32.5 g/dL (ref 30.0–36.0)
MCV: 81.5 fL (ref 80.0–100.0)
Platelets: 63 10*3/uL — ABNORMAL LOW (ref 150–400)
RBC: 4.65 MIL/uL (ref 4.22–5.81)
RDW: 16.6 % — ABNORMAL HIGH (ref 11.5–15.5)
WBC: 40.7 10*3/uL — ABNORMAL HIGH (ref 4.0–10.5)
nRBC: 0.7 % — ABNORMAL HIGH (ref 0.0–0.2)

## 2020-05-22 LAB — GLUCOSE, CAPILLARY: Glucose-Capillary: 170 mg/dL — ABNORMAL HIGH (ref 70–99)

## 2020-05-22 LAB — HEPARIN LEVEL (UNFRACTIONATED): Heparin Unfractionated: 0.74 IU/mL — ABNORMAL HIGH (ref 0.30–0.70)

## 2020-05-22 MED FILL — Medication: Qty: 1 | Status: AC

## 2020-05-28 LAB — TACROLIMUS LEVEL: Tacrolimus (FK506) - LabCorp: 1.9 ng/mL — ABNORMAL LOW (ref 2.0–20.0)

## 2020-06-10 NOTE — Progress Notes (Addendum)
   05/23/2020 1000  Clinical Encounter Type  Visited With Family  Visit Type Death  Referral From Other (Comment)  Consult/Referral To Chaplain  Patient's wife sees Chaplain waiting at the elevator. She states the patient's daughters are downstairs and not allowed to come up. Advised I will assist family and place them in 3M01 as we did yesterday. The patient's daughter asked to speak with the doctor. Respiratory associate notified doctor, while Cherlyn Cushing, Financial controller assisted the patient's daughter dressing in. Other family members informed only two can be in the room. Only two family members can view from door. Mrs. Turay requested equipped be removed, Respiratory associate explained why equipment will remain until after everyone has visited. Mrs. also stated he would not be a suitable candidate as a donor because of his health conditions. Chaplain gave Mrs. Marzec the Patient Placement card. Chaplain remains available if needed. This note was prepared by Jeanine Luz, M.Div..  For questions please contact by phone [phone].

## 2020-06-10 NOTE — Progress Notes (Signed)
DECEASED NOTE Incident: Patient death Assessment: On assessment patient had no respirations or heart sounds. Assessed patient pulse, which resulted in being pulseless. Extremities were cold/warm to touch and mottled. Confirmation: [facility] [name] RN ) Randy Quails  RN confirmed these findings as well. Time of Death: [name] [facility]: Called Family: Notified by MD  Morgue: Chaplin: Notified Patient Placement: Notified Comments:   [name], RN

## 2020-06-10 NOTE — Death Summary Note (Addendum)
**Note Randy-Identified via Obfuscation**   Name: [name] MRN: [id number] DOB: [name] 65 y.o.  Date of Admission: 05/12/2020 11:09 AM Date of Discharge: 06-04-20 Attending Physician: Marshell Garfinkel, MD  Discharge Diagnosis: Principal Problem:   Pneumonia due to COVID-19 virus Active Problems:   DM (diabetes mellitus), type 2, uncontrolled ([place])   CKD (chronic kidney disease) stage 3, GFR 30-59 ml/min (HCC)   AKI (acute kidney injury) ([place])   Septic shock (HCC)   Acute hypoxemic respiratory failure (HCC)   Atrial fibrillation, chronic (HCC)   Cerebral thrombosis with cerebral infarction   Cause of death: COVID-19 Time of death: 0848  Disposition and follow-up:   [name] was discharged from [facility] in expired condition.    Hospital Course: This is a 65 year old male with a history of atrial fibrillation on anticoagulation with apixaban, CKD stage III, status post nephrectomy and kidney transplant on immunosuppression, hypertension, hyperlipidemia, GERD, and diabetes who presented with shortness of breath and hypoxia.  Vaccinated with both doses of Pfizer, had not had booster. Found to have sepsis and acute hypoxic respiratory failure secondary to COVID-19 pneumonia.  Had elevated inflammatory markers.  Required 45 L high flow nasal cannula at 80% FiO2.  Patient was treated with steroids, home MMF and tacrolimus was held per nephrology. Patient had initial clinical improvement however then developed worsening respiratory status and required increasing FiO2. Patient was treated with Actemra and continued on steroids. Patient then developed worsening confusion and started refusing care, was transferred to the ICU and continued on HFNC.  Encephalopathy was thought to be secondary to acute respiratory failure and COVID-19. Started on Precedex as needed for agitation.  Patient continued to have increasing oxygen requirements and worsening encephalopathy. Missed several doses of apixaban while in the  hospital.  CT head was obtained that showed acute left PCA infarct neurology was consulted and recommended stroke work-up, and continuing aspirin.  Work-up for altered mental status including TSH, ammonia, B12, RPR, and HIV were all unremarkable.  EEG without seizure activity.  Patient placed on IV heparin and statin.  Continued to have worsening respiratory status and worsening mental status, developed a GI bleed and heparin was stopped.  Patient was intubated and later in the day developed PEA arrest and ACLS was done with CPR and epinephrine with ROSC in 15 minutes. Unfortunately no further options were available, and it was felt that further ACLS would be futile, discussed with wife and decided on no further escalation of care and code status changed to DNR. He was continued on epinephrine drip, norepinephrine drip, and full vent support.  On the morning of 1/13, patient developed worsening respiratory status and bradycardia, continued to deteriorate and went into cardiac arrest. Family informed.   Signed: Asencion Noble, MD 06/04/20, 10:12 AM  Marshell Garfinkel MD [facility] Please see [email or web address] for pager details.  June 04, 2020, 11:20 AM
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
## 2020-06-10 DEATH — deceased

## 2020-07-18 ENCOUNTER — Other Ambulatory Visit: Payer: Self-pay | Admitting: Cardiology

## 2020-07-30 ENCOUNTER — Ambulatory Visit: Payer: Medicare Other | Admitting: Nurse Practitioner

## 2020-10-17 ENCOUNTER — Ambulatory Visit: Payer: Medicare Other | Admitting: Cardiology

## 2021-04-05 ENCOUNTER — Encounter: Payer: Self-pay | Admitting: Cardiology

## 2022-03-19 LAB — METHYLMALONIC ACID, SERUM: Methylmalonic Acid, Quantitative: 690 nmol/L — ABNORMAL HIGH (ref 0–378)
